# Patient Record
Sex: Male | Born: 1945 | Race: White | Hispanic: No | Marital: Married | Smoking: Never smoker
Health system: Southern US, Community
[De-identification: ages and names within clinical notes are randomized; demographics above are authoritative.]

## PROBLEM LIST (undated history)

## (undated) DIAGNOSIS — M1990 Unspecified osteoarthritis, unspecified site: Secondary | ICD-10-CM

## (undated) DIAGNOSIS — R42 Dizziness and giddiness: Secondary | ICD-10-CM

## (undated) DIAGNOSIS — Z9989 Dependence on other enabling machines and devices: Secondary | ICD-10-CM

## (undated) DIAGNOSIS — G4700 Insomnia, unspecified: Secondary | ICD-10-CM

## (undated) DIAGNOSIS — I4891 Unspecified atrial fibrillation: Secondary | ICD-10-CM

## (undated) DIAGNOSIS — R011 Cardiac murmur, unspecified: Secondary | ICD-10-CM

## (undated) DIAGNOSIS — G20A1 Parkinson's disease without dyskinesia, without mention of fluctuations: Secondary | ICD-10-CM

## (undated) DIAGNOSIS — B009 Herpesviral infection, unspecified: Secondary | ICD-10-CM

## (undated) DIAGNOSIS — Z8709 Personal history of other diseases of the respiratory system: Secondary | ICD-10-CM

## (undated) DIAGNOSIS — G4730 Sleep apnea, unspecified: Secondary | ICD-10-CM

## (undated) DIAGNOSIS — I10 Essential (primary) hypertension: Secondary | ICD-10-CM

## (undated) DIAGNOSIS — G4733 Obstructive sleep apnea (adult) (pediatric): Secondary | ICD-10-CM

## (undated) DIAGNOSIS — M2550 Pain in unspecified joint: Secondary | ICD-10-CM

## (undated) DIAGNOSIS — G20 Parkinson's disease: Secondary | ICD-10-CM

## (undated) DIAGNOSIS — Z87898 Personal history of other specified conditions: Secondary | ICD-10-CM

## (undated) DIAGNOSIS — K219 Gastro-esophageal reflux disease without esophagitis: Secondary | ICD-10-CM

## (undated) HISTORY — PX: KNEE ARTHROSCOPY: SUR90

## (undated) HISTORY — DX: Herpesviral infection, unspecified: B00.9

## (undated) HISTORY — DX: Dependence on other enabling machines and devices: Z99.89

## (undated) HISTORY — DX: Dizziness and giddiness: R42

## (undated) HISTORY — PX: EYE SURGERY: SHX253

## (undated) HISTORY — PX: TONSILLECTOMY: SUR1361

## (undated) HISTORY — PX: ACHILLES TENDON REPAIR: SUR1153

## (undated) HISTORY — DX: Gastro-esophageal reflux disease without esophagitis: K21.9

## (undated) HISTORY — PX: BACK SURGERY: SHX140

## (undated) HISTORY — PX: OTHER SURGICAL HISTORY: SHX169

## (undated) HISTORY — PX: LUMBAR FUSION: SHX111

## (undated) HISTORY — DX: Obstructive sleep apnea (adult) (pediatric): G47.33

---

## 2000-04-23 ENCOUNTER — Inpatient Hospital Stay (HOSPITAL_COMMUNITY): Admission: EM | Admit: 2000-04-23 | Discharge: 2000-04-25 | Payer: Self-pay | Admitting: Orthopedic Surgery

## 2001-04-17 HISTORY — PX: SPINE SURGERY: SHX786

## 2001-05-22 ENCOUNTER — Ambulatory Visit (HOSPITAL_COMMUNITY): Admission: RE | Admit: 2001-05-22 | Discharge: 2001-05-22 | Payer: Self-pay | Admitting: Specialist

## 2001-05-22 ENCOUNTER — Encounter: Payer: Self-pay | Admitting: Specialist

## 2001-05-30 ENCOUNTER — Encounter: Payer: Self-pay | Admitting: Specialist

## 2001-05-30 ENCOUNTER — Encounter: Admission: RE | Admit: 2001-05-30 | Discharge: 2001-05-30 | Payer: Self-pay | Admitting: Specialist

## 2001-09-06 ENCOUNTER — Encounter: Admission: RE | Admit: 2001-09-06 | Discharge: 2001-09-06 | Payer: Self-pay | Admitting: Neurosurgery

## 2001-09-06 ENCOUNTER — Encounter: Payer: Self-pay | Admitting: Neurosurgery

## 2002-01-09 ENCOUNTER — Encounter: Payer: Self-pay | Admitting: Internal Medicine

## 2002-01-09 ENCOUNTER — Encounter: Admission: RE | Admit: 2002-01-09 | Discharge: 2002-01-09 | Payer: Self-pay | Admitting: Internal Medicine

## 2002-02-07 ENCOUNTER — Ambulatory Visit (HOSPITAL_BASED_OUTPATIENT_CLINIC_OR_DEPARTMENT_OTHER): Admission: RE | Admit: 2002-02-07 | Discharge: 2002-02-07 | Payer: Self-pay | Admitting: Otolaryngology

## 2003-07-13 ENCOUNTER — Encounter: Admission: RE | Admit: 2003-07-13 | Discharge: 2003-07-13 | Payer: Self-pay | Admitting: Internal Medicine

## 2004-04-13 ENCOUNTER — Ambulatory Visit (HOSPITAL_BASED_OUTPATIENT_CLINIC_OR_DEPARTMENT_OTHER): Admission: RE | Admit: 2004-04-13 | Discharge: 2004-04-13 | Payer: Self-pay | Admitting: Urology

## 2004-12-16 ENCOUNTER — Encounter: Admission: RE | Admit: 2004-12-16 | Discharge: 2004-12-16 | Payer: Self-pay | Admitting: Neurology

## 2005-12-29 ENCOUNTER — Ambulatory Visit: Payer: Self-pay | Admitting: Internal Medicine

## 2006-03-15 ENCOUNTER — Ambulatory Visit: Payer: Self-pay | Admitting: Internal Medicine

## 2006-11-21 ENCOUNTER — Ambulatory Visit: Payer: Self-pay | Admitting: Internal Medicine

## 2006-11-21 DIAGNOSIS — F411 Generalized anxiety disorder: Secondary | ICD-10-CM | POA: Insufficient documentation

## 2007-10-22 ENCOUNTER — Emergency Department: Payer: Self-pay | Admitting: Emergency Medicine

## 2007-10-24 ENCOUNTER — Emergency Department: Payer: Self-pay | Admitting: Emergency Medicine

## 2007-11-01 ENCOUNTER — Emergency Department: Payer: Self-pay | Admitting: Emergency Medicine

## 2009-04-28 ENCOUNTER — Encounter: Payer: Self-pay | Admitting: Internal Medicine

## 2010-05-17 NOTE — Consult Note (Signed)
Summary: [facility]  [facility]   Imported By: Lanelle Bal 05/12/2009 14:59:48  _____________________________________________________________________  External Attachment:    Type:   Image     Comment:   External Document

## 2010-05-17 NOTE — Assessment & Plan Note (Signed)
Summary: ANXIETY PROBLEMS//[place]   Vital Signs:  Patient Profile:   65 Years Old Male Weight:      216.50 pounds BP sitting:   120 / 80  Pt. in pain?   no  Vitals Entered By: Kandice Hams (November 21, 2006 4:00 PM) Oxygen therapy [phone]                Chief Complaint:  discuss anxiety and stress and its symptoms.  History of Present Illness: Stress & anxiety with tests & with problems @ job sites.Financial strain as some jobs don't pay as well as expected.Wife notes temper. Mother paranoid schizophrenic.     Family History:    M paranoid schizophrenia    Review of Systems  Psych      Complains of anxiety, easily angered, and irritability.      Denies depression, easily tearful, mental problems, panic attacks, sense of great danger, suicidal thoughts/plans, thoughts of violence, and thoughts /plans of harming others.      PMH SAD with public speaking   Physical Exam  General:     Well-developed,well-nourished,in no acute distress; alert,appropriate and cooperative throughout examination Psych:     Oriented X3, memory intact for recent and remote, normally interactive, good eye contact, not anxious appearing, not depressed appearing, not agitated,  subdued.  MDQ  revealed 4+ answers    Impression & Recommendations:  Problem # 1:  ANXIETY STATE NOS (ICD-300.00)  His updated medication list for this problem includes:    Prozac 10 Mg Caps (Fluoxetine hcl) .Marland Kitchen... 1 once daily   Complete Medication List: 1)  Prozac 10 Mg Caps (Fluoxetine hcl) .Marland Kitchen.. 1 once daily   Patient Instructions: 1)  Please schedule a follow-up appointment in 1 month.Take fluoxetine Sun- Thurs    Prescriptions: PROZAC 10 MG  CAPS (FLUOXETINE HCL) 1 once daily  #90 x 0   Entered and Authorized by:   Marga Melnick MD   Signed by:   Marga Melnick MD on 11/21/2006   Method used:   Print then Give to Patient   RxID:   [id number]

## 2010-09-02 NOTE — Op Note (Signed)
. [facility]  Patient:    [name], [name]                    MRN: [id number] Proc. Date: 04/23/00 Adm. Date:  [id number] Attending:  Skip Mayer                           Operative Report  PREOPERATIVE DIAGNOSIS: 1. Plantar fasciitis with a large plantar heel spur, left. 2. Large painful posterior os calcis heel spur with acute Achilles tendon    tendinitis, left.  POSTOPERATIVE DIAGNOSIS: 1. Plantar fasciitis with a large plantar heel spur, left. 2. Large painful posterior os calcis heel spur with acute Achilles tendon    tendinitis, left.  OPERATION PERFORMED: 1. Plantar fasciotomy, left heel. 2. Excision of heel spur, left heel. 3. Removal of large posterior heel spur by elevating the Achilles tendon.  SURGEON:  Georges Lynch. Darrelyn Hillock, M.D.  ASSISTANT:  Nurse.  ANESTHESIA:  General.  DESCRIPTION OF PROCEDURE:  Under general anesthesia, routine orthopedic prep and drape of the left lower extremity was carried out.  The leg was exsanguinated with an Esmarch.  The tourniquet was elevated at 350 mmHg.  An incision was made over the medial plantar aspect of the left foot.  Bleeders were identified and cauterized.  Great care was taken not to injure underlying neurovascular structures.  Identified the plantar fascia, did a plantar fasciotomy.  I then went down and identified a large heel spur.  I dissected around the heel and utilized the osteotome to remove the spur.  I thoroughly irrigated out the area.  Some bone wax was used to wax the bleeding bone. After I thoroughly irrigated out the area, I inserted some thrombin soaked Gelfoam.  The wound then was closed in layers usual fashion.  Skin was closed with metal staples.  Next, an incision was made along the posterolateral aspect of the left heel. At that time great care was taken not to injure ____________  sensory nerves.  I went ahead and elevated the Achilles tendon and  utilized the oscillating saw, removed a large spur.  There was one major large spur.  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  Drill holes were made in the os calcis and I then sutured the tendon back to the os calcis. I then reapproximated the rim of the tendon as well.  The wound was irrigated and closed in layers in the usual fashion.  I injected about 8 cc total of 0.5% Marcaine without epinephrine into both wound sites.  Sterile Neosporin dressings were applied.  The foot was placed in plantar flexion and I put him in a short leg cast.  Patient was given 1 gm IV Ancef preoperatively.  He was started on Coumadin and heparin protocol postoperatively. DD:  04/23/00 TD:  04/23/00 Job: [id number]

## 2010-09-02 NOTE — Discharge Summary (Signed)
[facility]  Patient:    [name], [name]                    MRN: [id number] Adm. Date:  [id number] Disc. Date: 04/25/00 Attending:  Skip Mayer Dictator:   Della Goo, P.A.                           Discharge Summary  ADMISSION DIAGNOSIS:  Heel spur of the plantar aspect and posterior aspect of the left heel.  DISCHARGE DIAGNOSIS:  Heel spur of the plantar aspect and posterior aspect of the left heel.  PROCEDURES:  Plantar fasciotomy and excision of plantar heel spur, as well as excision of posterior heel spur by elevating the Achilles tendon, and application of short-leg cast, by Dr. Darrelyn Hillock.  CONSULTATIONS:  None.  HISTORY OF PRESENT ILLNESS:  The patient has had diagnosis of left heel spurs for many years, however, recently has had considerable discomfort and was noted to have calcification on x-ray of the heel spurs both in the posterior aspect as well as the plantar aspect of the heel.  It was felt he would require excision of these and was admitted for the procedure.  HOSPITAL COURSE:  The patient tolerated the procedure well.  On the first postoperative day, he was nauseated and unable to take oral analgesics or eat anything.  He required IV analgesics.  His morphine was discontinued and, as the nausea subsided, he was able to be weaned on to p.o. analgesics.  He was placed on Coumadin for DVT and PE prophylaxis.  While in the hospital adjustments in dosage were made according to daily protimes by the pharmacy. On the second postoperative day he had been noted to have a spike in temperature to 102 through the evening.  However, this had resolved with use of incentive spirometry and increased activity.  His nausea was resolved, and he was eating well.  He had no pulmonary or urinary symptoms, and his calves were soft.  He was ambulating in the hallway with no assistance, utilizing crutches, and maintaining a  nonweightbearing status on the operative extremity.  His cast was in good condition, and he was felt stable for discharge to his home.  PERTINENT LABORATORY DATA:  Admission CBC within normal limits.  Repeat postoperatively once again within normal limits.  Coagulation screen on admission was normal.  Prior to discharge his last INR was 1.3.  No x-rays were taken.  EKG on admission showed normal sinus rhythm, incomplete right bundle branch block.  PLAN:  The patient is being discharged to his home.  Arrangements will be made for him to have outpatient protimes and INRs drawn weekly for three weeks, with results telephoned to Dr. Darrelyn Hillock for adjustments in his Coumadin dose.  MEDICATIONS:  He was given prescriptions for: 1. Coumadin 5 mg once daily. 2. Robaxin 500 mg 1 every eight hours as needed for spasm. 3. Percocet 5 mg 1-2 every four to six hours as needed for pain.  DISCHARGE INSTRUCTIONS:  He will monitor his fever closely at home, and if he has further elevations in fever, he will call Dr. Joellyn Rued office.  He will continue to use his incentive spirometry and has been encouraged to continue activity as tolerated.  He will use crutches for ambulation, nonweightbearing on the operative extremity.  He was advised to keep his cast dry and clean at all times and continue elevation of the left  lower extremity.  If he has further questions or concerns, he has been advised to call the office.  FOLLOW-UP:  He will follow up with Dr. Darrelyn Hillock two weeks from the date of his surgery. DD:  04/25/00 TD:  04/25/00 Job: [id number]

## 2010-09-02 NOTE — Op Note (Signed)
NAMECLIF, [name]             ACCOUNT NO.:  [id number]   MEDICAL RECORD NO.:  [id number]          PATIENT TYPE:  AMB   LOCATION:  [facility]                         FACILITY:  [facility]   PHYSICIAN:  Ronald L. Ovidio Hanger, M.D.DATE OF BIRTH:  1945/09/14   DATE OF PROCEDURE:  04/13/2004  DATE OF DISCHARGE:                                 OPERATIVE REPORT   PREOPERATIVE DIAGNOSIS:  Symptomatic left varicocele.   POSTOPERATIVE DIAGNOSIS:  Symptomatic left varicocele.   OPERATION PERFORMED:  Left subinguinal varicocelectomy.   SURGEON:  Lucrezia Starch. Earlene Plater, M.D.   ANESTHESIA:  LMA.   ESTIMATED BLOOD LOSS:  10 mL.   TUBES:  None.   COMPLICATIONS:  None.   INDICATIONS FOR PROCEDURE:  [name] is a very nice 65 year old white  male who presented with left groin pain which had been intermittent and  progressively increased.  On Color Flow Doppler scrotal ultrasound he was  found to have a small right spermatocele but a significant left varicocele  with retrograde blood flow and Valsalva maneuver probably resulting in his  discomfort.  After understanding risks, benefits and alternatives, it was  elected to proceed with left varicocelectomy.   DESCRIPTION OF PROCEDURE:  The patient was placed in supine position.  After  proper LMA anesthesia he was prepped and draped with Betadine in sterile  fashion.  An oblique inguinal incision was made approximately 2 cm in  length.  Sharp dissection was carried down to the subcutaneous tissue.  Scarpa's fascia was separated and the inguinal cord was identified.  The  ilioinguinal nerve was identified and protected.  The external spermatic  fascia was dissected down along with the cremasterics and the cord was  delivered with the vas deferens.  There was a significant lipoma of the cord  noted encircling the varicocele varicose veins.  The vas deferens with its  accompanying vessels were separated and dissection was carried down through  the lipoma.   A group of approximately four large varicose veins were noted  to be in dense adherence and were ligated with 4-0 silk ligature.  The  internal spermatic artery was identified and protected.  No further  varicosities were noted in the cord and it was replaced.  There was an  external spermatic vein noted present and it was ligated also with 4-0 silk  ligature.  Thorough irrigation was performed.  Good hemostasis was noted to  be present.  Scarpa's fascia was closed with  interrupted 2-0 chromic catgut buried sutures and the skin was closed with a  running 4-0 Vicryl subcuticular type stitch and dressed with benzoin and  Steri-Strips, Telfa and toppers.  The patient tolerated the procedure well.  There were no complications.  He was taken to recovery room stable.  The  testicle was palpable in the left scrotal sac.     Rona   RLD/MEDQ  D:  04/13/2004  T:  04/13/2004  Job:  [id number]

## 2011-02-16 HISTORY — PX: COLONOSCOPY: SHX174

## 2011-02-16 LAB — HM COLONOSCOPY

## 2011-06-27 ENCOUNTER — Ambulatory Visit (INDEPENDENT_AMBULATORY_CARE_PROVIDER_SITE_OTHER): Payer: Medicare Other | Admitting: Internal Medicine

## 2011-06-27 ENCOUNTER — Encounter: Payer: Self-pay | Admitting: Internal Medicine

## 2011-06-27 VITALS — BP 128/90 | HR 76 | Temp 98.5°F | Wt 228.0 lb

## 2011-06-27 DIAGNOSIS — R1012 Left upper quadrant pain: Secondary | ICD-10-CM

## 2011-06-27 DIAGNOSIS — I4510 Unspecified right bundle-branch block: Secondary | ICD-10-CM

## 2011-06-27 DIAGNOSIS — R0781 Pleurodynia: Secondary | ICD-10-CM

## 2011-06-27 DIAGNOSIS — R079 Chest pain, unspecified: Secondary | ICD-10-CM

## 2011-06-27 DIAGNOSIS — Z1289 Encounter for screening for malignant neoplasm of other sites: Secondary | ICD-10-CM

## 2011-06-27 LAB — CBC WITH DIFFERENTIAL/PLATELET
Basophils Absolute: 0 K/uL (ref 0.0–0.1)
Basophils Relative: 0.4 % (ref 0.0–3.0)
Eosinophils Absolute: 0.1 K/uL (ref 0.0–0.7)
Eosinophils Relative: 1.9 % (ref 0.0–5.0)
HCT: 44.6 % (ref 39.0–52.0)
Hemoglobin: 15 g/dL (ref 13.0–17.0)
Lymphocytes Relative: 28.5 % (ref 12.0–46.0)
Lymphs Abs: 1.9 K/uL (ref 0.7–4.0)
MCHC: 33.7 g/dL (ref 30.0–36.0)
MCV: 90.5 fl (ref 78.0–100.0)
Monocytes Absolute: 0.5 K/uL (ref 0.1–1.0)
Monocytes Relative: 8.3 % (ref 3.0–12.0)
Neutro Abs: 4 K/uL (ref 1.4–7.7)
Neutrophils Relative %: 60.9 % (ref 43.0–77.0)
Platelets: 195 K/uL (ref 150.0–400.0)
RBC: 4.93 Mil/uL (ref 4.22–5.81)
RDW: 12.9 % (ref 11.5–14.6)
WBC: 6.6 K/uL (ref 4.5–10.5)

## 2011-06-27 MED ORDER — RANITIDINE HCL 150 MG PO TABS: 150.0000 mg | ORAL_TABLET | Freq: Two times a day (BID) | ORAL | Status: DC

## 2011-06-27 NOTE — Progress Notes (Signed)
  Subjective:    Patient ID: [name], male    DOB: January 01, 1946, 66 y.o.   MRN: [id number]  HPI ABDOMINAL  PAIN: Location: Upper left quadrant, right under ribs. He is not having rib pain but pain under the ribs in left upper quadrant Onset: 2 weeks ago  Radiation: No  Severity: up to 4/10 Quality/Character: , bloating  Duration: up to 1 hour  Better with: Alka-seltzer Worse with: Eating; ONLY with meals  Symptoms Nausea/Vomiting: Some nausea, no vomiting  Diarrhea: No Constipation: No Melena/BRBPR: No Hematemesis: No Anorexia: No Fever/Chills: No  Dysuria/hematuria/pyuria: No Rash: No   Wt loss: No NSAIDs/ASA: Occasionally; he does not ingest caffeine. The triggers for reflux were reviewed and none are suggested to be present   Past Surgeries: no Endo; colonoscopy November/12 was negative [facility]).  No personal or family history of ulcers, gastritis,, colitis, colon polyps.       Review of Systems He describes rare palpitations. He denies chest pain, dyspnea, paroxysmal nocturnal dyspnea, or edema. He also has no claudication,pleurisy, cough, sputum production, or hemoptysis.  He has had bilateral hip pain which actually gets better with ambulation.     Objective:   Physical Exam Gen.: Healthy and well-nourished in appearance. Alert, appropriate and cooperative throughout exam. Eyes: No corneal or conjunctival inflammation noted. No icterus Mouth: Oral mucosa and oropharynx reveal no lesions or exudates. Teeth in good repair. Neck: No deformities, masses, or tenderness noted.  Thyroid normal. Lungs: Normal respiratory effort; chest expands symmetrically. Lungs are clear to auscultation without rales, wheezes, or increased work of breathing. Heart: Normal rate and rhythm. Normal S1 and S2. No gallop, click, or rub. No  murmur. Abdomen: Bowel sounds normal; abdomen soft and nontender. No masses, organomegaly or hernias noted.                                                Musculoskeletal/extremities: Minor asymmetry noted of  the thoracic spine. No clubbing, cyanosis, edema, or deformity noted. ROM normal.Tone & strength  normal.Joints normal. Nail health  good. Vascular: Carotid, radial artery, dorsalis pedis and  posterior tibial pulses are full and equal. No bruits present. Neurologic: Alert and oriented x3. Deep tendon reflexes symmetrical and normal.         Skin: Intact without suspicious lesions or rashes. Lymph: No cervical, axillary lymphadenopathy present. Psych: Mood and affect are normal. Normally interactive                                                                                         Assessment & Plan:  #1 left upper quadrant abdominal pain; most likely is hiatal hernia and reflux. EKG reveals no ischemic changes. He does have a right bundle branch block.  Plan: See orders and recommendations

## 2011-06-27 NOTE — Patient Instructions (Addendum)
Please complete stool cards ; STOP Alka Seltzer. The triggers for dyspepsia or "heart burn"  include stress; the "aspirin family" ; alcohol; peppermint; and caffeine (coffee, tea, cola, and chocolate). The aspirin family would include aspirin and the nonsteroidal agents such as ibuprofen &  Naproxen. Tylenol would not cause reflux. If having dyspepsia ; food & drink should be avoided for @ least 2 hours before going to bed.

## 2011-07-04 LAB — POC HEMOCCULT BLD/STL (OFFICE/1-CARD/DIAGNOSTIC): Fecal Occult Blood, POC: NEGATIVE

## 2011-07-04 NOTE — Progress Notes (Signed)
Addended by: Silvio Pate D on: 07/04/2011 01:38 PM   Modules accepted: Orders

## 2011-07-24 ENCOUNTER — Telehealth: Payer: Self-pay | Admitting: Internal Medicine

## 2011-07-24 NOTE — Telephone Encounter (Signed)
RX was sent in last month for #60 with 2 refills. I called the pharmacy and spoke with Heidi, rx was received and on file, this was sent in error

## 2011-07-24 NOTE — Telephone Encounter (Signed)
ranitidine 150mg  tablet.

## 2011-09-25 ENCOUNTER — Other Ambulatory Visit: Payer: Self-pay | Admitting: Internal Medicine

## 2011-12-27 ENCOUNTER — Other Ambulatory Visit: Payer: Self-pay | Admitting: Internal Medicine

## 2012-06-01 ENCOUNTER — Other Ambulatory Visit: Payer: Self-pay

## 2012-06-17 ENCOUNTER — Other Ambulatory Visit: Payer: Self-pay | Admitting: Internal Medicine

## 2012-06-17 NOTE — Telephone Encounter (Signed)
Schedule CPX 

## 2012-08-18 ENCOUNTER — Other Ambulatory Visit: Payer: Self-pay | Admitting: Internal Medicine

## 2012-08-19 NOTE — Telephone Encounter (Signed)
Pt called needs an OV has not been seen in over a year.

## 2012-09-16 ENCOUNTER — Other Ambulatory Visit: Payer: Self-pay | Admitting: Internal Medicine

## 2012-09-16 NOTE — Telephone Encounter (Signed)
Pt with pending appt

## 2012-09-16 NOTE — Telephone Encounter (Signed)
Pt with pending appt 10-11-12

## 2012-10-11 ENCOUNTER — Encounter: Payer: Self-pay | Admitting: Internal Medicine

## 2012-10-11 ENCOUNTER — Ambulatory Visit (INDEPENDENT_AMBULATORY_CARE_PROVIDER_SITE_OTHER): Payer: Medicare Other | Admitting: Internal Medicine

## 2012-10-11 VITALS — BP 132/84 | HR 97 | Temp 98.2°F | Ht 74.0 in | Wt 226.0 lb

## 2012-10-11 DIAGNOSIS — Z0000 Encounter for general adult medical examination without abnormal findings: Secondary | ICD-10-CM

## 2012-10-11 DIAGNOSIS — K219 Gastro-esophageal reflux disease without esophagitis: Secondary | ICD-10-CM

## 2012-10-11 DIAGNOSIS — N429 Disorder of prostate, unspecified: Secondary | ICD-10-CM

## 2012-10-11 DIAGNOSIS — E785 Hyperlipidemia, unspecified: Secondary | ICD-10-CM

## 2012-10-11 DIAGNOSIS — E782 Mixed hyperlipidemia: Secondary | ICD-10-CM | POA: Insufficient documentation

## 2012-10-11 DIAGNOSIS — I4510 Unspecified right bundle-branch block: Secondary | ICD-10-CM

## 2012-10-11 LAB — CBC WITH DIFFERENTIAL/PLATELET
Basophils Absolute: 0 10*3/uL (ref 0.0–0.1)
Basophils Relative: 0.3 % (ref 0.0–3.0)
Eosinophils Absolute: 0.1 10*3/uL (ref 0.0–0.7)
Eosinophils Relative: 0.9 % (ref 0.0–5.0)
HCT: 45.5 % (ref 39.0–52.0)
Hemoglobin: 15.3 g/dL (ref 13.0–17.0)
Lymphocytes Relative: 26.9 % (ref 12.0–46.0)
Lymphs Abs: 2.1 10*3/uL (ref 0.7–4.0)
MCHC: 33.6 g/dL (ref 30.0–36.0)
MCV: 91.1 fl (ref 78.0–100.0)
Monocytes Absolute: 0.7 10*3/uL (ref 0.1–1.0)
Monocytes Relative: 8.8 % (ref 3.0–12.0)
Neutro Abs: 4.9 10*3/uL (ref 1.4–7.7)
Neutrophils Relative %: 63.1 % (ref 43.0–77.0)
Platelets: 205 10*3/uL (ref 150.0–400.0)
RBC: 4.99 Mil/uL (ref 4.22–5.81)
RDW: 12.7 % (ref 11.5–14.6)
WBC: 7.8 10*3/uL (ref 4.5–10.5)

## 2012-10-11 LAB — LIPID PANEL
Cholesterol: 147 mg/dL (ref 0–200)
HDL: 27.5 mg/dL — ABNORMAL LOW (ref >39.00)
LDL Cholesterol: 85 mg/dL (ref 0–99)
Total CHOL/HDL Ratio: 5
Triglycerides: 173 mg/dL — ABNORMAL HIGH (ref 0.0–149.0)
VLDL: 34.6 mg/dL (ref 0.0–40.0)

## 2012-10-11 LAB — PSA: PSA: 0.5 ng/mL (ref 0.10–4.00)

## 2012-10-11 LAB — BASIC METABOLIC PANEL
BUN: 14 mg/dL (ref 6–23)
CO2: 24 mEq/L (ref 19–32)
Calcium: 9.2 mg/dL (ref 8.4–10.5)
Chloride: 103 mEq/L (ref 96–112)
Creatinine, Ser: 1.3 mg/dL (ref 0.4–1.5)
GFR: 61.36 mL/min (ref >60.00)
Glucose, Bld: 92 mg/dL (ref 70–99)
Potassium: 4.2 mEq/L (ref 3.5–5.1)
Sodium: 134 mEq/L — ABNORMAL LOW (ref 135–145)

## 2012-10-11 LAB — HEPATIC FUNCTION PANEL
ALT: 34 U/L (ref 0–53)
AST: 25 U/L (ref 0–37)
Albumin: 4.6 g/dL (ref 3.5–5.2)
Alkaline Phosphatase: 57 U/L (ref 39–117)
Bilirubin, Direct: 0.2 mg/dL (ref 0.0–0.3)
Total Bilirubin: 0.8 mg/dL (ref 0.3–1.2)
Total Protein: 7.6 g/dL (ref 6.0–8.3)

## 2012-10-11 LAB — TSH: TSH: 2.37 u[IU]/mL (ref 0.35–5.50)

## 2012-10-11 MED ORDER — RANITIDINE HCL 150 MG PO TABS: ORAL_TABLET | ORAL | Status: DC

## 2012-10-11 NOTE — Progress Notes (Signed)
Subjective:    Patient ID: [name], male    DOB: [birth year], 67 y.o.   MRN: [id number]  HPI Medicare Wellness Visit:  Psychosocial & medical history were reviewed as required by Medicare (abuse,antisocial behavioral risks,firearm risk).  Social history: caffeine: rare  , alcohol: no  ,  tobacco use:  never  Exercise :  Physically active Home & personal  safety / fall risk:no Limitations of activities of daily living:no Seatbelt  and smoke alarm use:yes Power of Attorney/Living Will status : in place Ophthalmology exam status :current Hearing evaluation status:not current Orientation :oriented X 3 Memory & recall :good  Spelling or math testing:good Active depression / anxiety:denied Foreign travel history : [place] 1995 Immunization status for Shingles /Flu/ PNA/ tetanus : Shingles & PNA needed Transfusion history:  no Preventive health surveillance status of colonoscopy as per protocol/ SOC: current Dental care: every 6 mos  Chart reviewed &  Updated. Active issues reviewed & addressed.      Review of Systems  He has dyspepsia if he does not take the ranitidine every day. He denies significant hoarseness, dysphagia, abdominal pain, unexplained weight loss, melena, or rectal bleeding. Colonoscopy is current and was negative.  He has a history of mild hyperlipidemia; he's never been on a statin     Objective:   Physical Exam Gen.: Healthy and well-nourished in appearance. Alert, appropriate and cooperative throughout exam. Head: Normocephalic without obvious abnormalities; goatee Eyes: No corneal or conjunctival inflammation noted. Pupils equal round reactive to light. Extraocular motion intact. Vision grossly normal with lenses Ears: External  ear exam reveals no significant lesions or deformities. Canals clear .TMs normal. Hearing is grossly normal bilaterally. Nose: External nasal exam reveals no deformity or inflammation. Nasal mucosa are pink and moist. No  lesions or exudates noted.  Mouth: Oral mucosa and oropharynx reveal no lesions or exudates. Teeth in good repair. Neck: No deformities, masses, or tenderness noted. Range of motion & Thyroid normal. Lungs: Normal respiratory effort; chest expands symmetrically. Lungs are clear to auscultation without rales, wheezes, or increased work of breathing. Heart: Normal rate and rhythm. Normal S1 and S2. No gallop, click, or rub. S4 w/o murmur. Abdomen: Bowel sounds normal; abdomen soft and nontender. No masses, organomegaly or hernias noted. Genitalia: Genitalia normal except for left varices. Prostate is asymmetrically enlarged on L w/o nodularity or induration                               Musculoskeletal/extremities: No deformity or scoliosis noted of  the thoracic or lumbar spine.  No clubbing, cyanosis, edema, or significant extremity  deformity noted. Range of motion normal .Tone & strength  Normal. Joints normal . Nail health good. Able to lie down & sit up w/o help. Negative SLR bilaterally Vascular: Carotid, radial artery, dorsalis pedis and  posterior tibial pulses are full and equal. No bruits present. Neurologic: Alert and oriented x3. Deep tendon reflexes symmetrical and normal.         Skin: Intact without suspicious lesions or rashes. Lymph: No cervical, axillary, or inguinal lymphadenopathy present. Psych: Mood and affect are normal. Normally interactive  Assessment & Plan:  #1 Medicare Wellness Exam; criteria met ; data entered #2 Problem List/Diagnoses reviewed #3 asymmetric prostate Plan:  Assessments made/ Orders entered

## 2012-10-11 NOTE — Patient Instructions (Addendum)
Reflux of gastric acid may be asymptomatic as this may occur mainly during sleep.The triggers for reflux  include stress; the "aspirin family" ; alcohol; peppermint; and caffeine (coffee, tea, cola, and chocolate). The aspirin family would include aspirin and the nonsteroidal agents such as ibuprofen &  Naproxen. Tylenol would not cause reflux. If having symptoms ; food & drink should be avoided for @ least 2 hours before going to bed.   If you activate the  My Chart system; lab & Xray results will be released directly  to you as soon as I review & address these through the computer. If you choose not to sign up for My Chart within 36 hours of labs being drawn; results will be reviewed & interpretation added before being copied & mailed, causing a delay in getting the results to you.If you do not receive that report within 7-10 days ,please call. Additionally you can use this system to gain direct  access to your records  if  out of town or @ an office of a  physician who is not in  the My Chart network.  This improves continuity of care & places you in control of your medical record.

## 2012-11-20 ENCOUNTER — Other Ambulatory Visit: Payer: Self-pay

## 2013-02-20 ENCOUNTER — Other Ambulatory Visit: Payer: Self-pay

## 2013-10-01 ENCOUNTER — Other Ambulatory Visit: Payer: Self-pay

## 2013-10-01 DIAGNOSIS — K219 Gastro-esophageal reflux disease without esophagitis: Secondary | ICD-10-CM

## 2013-10-01 MED ORDER — RANITIDINE HCL 150 MG PO TABS: ORAL_TABLET | ORAL | Status: DC

## 2014-05-12 ENCOUNTER — Ambulatory Visit: Payer: Medicare Other | Admitting: Family Medicine

## 2014-06-19 ENCOUNTER — Other Ambulatory Visit: Payer: Self-pay | Admitting: Neurosurgery

## 2014-06-19 DIAGNOSIS — M5416 Radiculopathy, lumbar region: Secondary | ICD-10-CM

## 2014-07-08 ENCOUNTER — Ambulatory Visit
Admission: RE | Admit: 2014-07-08 | Discharge: 2014-07-08 | Disposition: A | Discharge status: Home / Self Care | Payer: PPO | Source: Ambulatory Visit | Attending: Neurosurgery | Admitting: Neurosurgery

## 2014-07-08 DIAGNOSIS — M5416 Radiculopathy, lumbar region: Secondary | ICD-10-CM

## 2014-10-13 ENCOUNTER — Encounter: Payer: Self-pay | Admitting: Internal Medicine

## 2014-10-13 ENCOUNTER — Ambulatory Visit (INDEPENDENT_AMBULATORY_CARE_PROVIDER_SITE_OTHER): Payer: PPO | Admitting: Internal Medicine

## 2014-10-13 ENCOUNTER — Other Ambulatory Visit (INDEPENDENT_AMBULATORY_CARE_PROVIDER_SITE_OTHER): Payer: PPO

## 2014-10-13 VITALS — BP 140/84 | HR 80 | Temp 98.6°F | Resp 15 | Ht 75.0 in | Wt 223.5 lb

## 2014-10-13 DIAGNOSIS — E782 Mixed hyperlipidemia: Secondary | ICD-10-CM

## 2014-10-13 DIAGNOSIS — K219 Gastro-esophageal reflux disease without esophagitis: Secondary | ICD-10-CM

## 2014-10-13 DIAGNOSIS — R42 Dizziness and giddiness: Secondary | ICD-10-CM

## 2014-10-13 DIAGNOSIS — N429 Disorder of prostate, unspecified: Secondary | ICD-10-CM

## 2014-10-13 DIAGNOSIS — Z0000 Encounter for general adult medical examination without abnormal findings: Secondary | ICD-10-CM

## 2014-10-13 HISTORY — DX: Dizziness and giddiness: R42

## 2014-10-13 LAB — HEPATIC FUNCTION PANEL
ALT: 30 U/L (ref 0–53)
AST: 22 U/L (ref 0–37)
Albumin: 4.5 g/dL (ref 3.5–5.2)
Alkaline Phosphatase: 63 U/L (ref 39–117)
Bilirubin, Direct: 0.2 mg/dL (ref 0.0–0.3)
Total Bilirubin: 0.8 mg/dL (ref 0.2–1.2)
Total Protein: 7.7 g/dL (ref 6.0–8.3)

## 2014-10-13 LAB — CBC WITH DIFFERENTIAL/PLATELET
Basophils Absolute: 0 10*3/uL (ref 0.0–0.1)
Basophils Relative: 0.3 % (ref 0.0–3.0)
Eosinophils Absolute: 0.2 10*3/uL (ref 0.0–0.7)
Eosinophils Relative: 2.8 % (ref 0.0–5.0)
HCT: 47.5 % (ref 39.0–52.0)
Hemoglobin: 16.1 g/dL (ref 13.0–17.0)
Lymphocytes Relative: 21.8 % (ref 12.0–46.0)
Lymphs Abs: 1.8 10*3/uL (ref 0.7–4.0)
MCHC: 33.9 g/dL (ref 30.0–36.0)
MCV: 90.1 fl (ref 78.0–100.0)
Monocytes Absolute: 0.7 10*3/uL (ref 0.1–1.0)
Monocytes Relative: 8.9 % (ref 3.0–12.0)
Neutro Abs: 5.4 10*3/uL (ref 1.4–7.7)
Neutrophils Relative %: 66.2 % (ref 43.0–77.0)
Platelets: 208 10*3/uL (ref 150.0–400.0)
RBC: 5.28 Mil/uL (ref 4.22–5.81)
RDW: 13.2 % (ref 11.5–15.5)
WBC: 8.2 10*3/uL (ref 4.0–10.5)

## 2014-10-13 LAB — BASIC METABOLIC PANEL
BUN: 16 mg/dL (ref 6–23)
CO2: 28 mEq/L (ref 19–32)
Calcium: 9.7 mg/dL (ref 8.4–10.5)
Chloride: 102 mEq/L (ref 96–112)
Creatinine, Ser: 1.34 mg/dL (ref 0.40–1.50)
GFR: 56.29 mL/min — ABNORMAL LOW (ref >60.00)
Glucose, Bld: 96 mg/dL (ref 70–99)
Potassium: 4.7 mEq/L (ref 3.5–5.1)
Sodium: 139 mEq/L (ref 135–145)

## 2014-10-13 LAB — LIPID PANEL
Cholesterol: 155 mg/dL (ref 0–200)
HDL: 38.3 mg/dL — ABNORMAL LOW (ref >39.00)
LDL Cholesterol: 100 mg/dL — ABNORMAL HIGH (ref 0–99)
NonHDL: 116.7
Total CHOL/HDL Ratio: 4
Triglycerides: 83 mg/dL (ref 0.0–149.0)
VLDL: 16.6 mg/dL (ref 0.0–40.0)

## 2014-10-13 LAB — PSA: PSA: 0.68 ng/mL (ref 0.10–4.00)

## 2014-10-13 LAB — TSH: TSH: 3.03 u[IU]/mL (ref 0.35–4.50)

## 2014-10-13 NOTE — Assessment & Plan Note (Signed)
Lipids, LFTs, TSH  

## 2014-10-13 NOTE — Progress Notes (Signed)
Subjective:   [name] is a 69 y.o. male who presents for Medicare Annual/Subsequent preventive examination.  Review of Systems:  HRA assessment completed during visit;  Patient is here for Annual Wellness Assessment / c/o of vertigo x 2 days; resolving some today Started yesterday around noon; states he lies down; room spinning some less than a minute   Problem list review for risk metabolic syndrome; hyperlipidemia <HDL; and is overweight; with abdominal weight;  Social hx; Lives with spouse  Lipids 10/11/12; chol 147; Trig 173; HDL 27; LDL 85; Ratio 5  BMI: 27.9 Diet; eats 2 meals; Eggs; hash browns; gravy; bacon sometimes; coffee Lunch vegetables with chicken Supper; tomato sandwich or cereal  Eats a late lunch;  Exercise; goes to the gym but stopped due to back pain; Walks about 2 miles qod when his back is not hurting; Just rec'd a shot to his back and did not hurt x 3 weeks; but then started hurting again. 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 Linna Darner;  Shingles/ educated on Part d Colonoscopy; completed in [place];  EKG 10/11/12 Vision: Due Hearing: 2000hz  both ears Dental: regular Uses  "past tense" essential on forehead and helps with h/a   Gave information on safety to take home;       Objective:    Vitals: BP 140/84 mmHg  Pulse 80  Temp(Src) 98.6 F (37 C) (Oral)  Ht 6\' 3"  (1.905 m)  Wt 223 lb 8 oz (101.379 kg)  BMI 27.94 kg/m2  SpO2 96%  Tobacco History  Smoking status  . Never Smoker   Smokeless tobacco  . Not on file     Counseling given: Not Answered   Past Medical History  Diagnosis Date  . GERD (gastroesophageal reflux disease)   . Hyperlipidemia   . Vertigo 10/13/2014    recent episode; c/o of issue with left ear   Past Surgical History  Procedure Laterality Date  . Spine surgery  2003    cervical fusion  . Knee arthroscopy      L  . Achilles tendon repair    . Varicoelectomy    . Colonoscopy  02/2011    Negative,[facility]   Family History  Problem Relation Age of Onset  . Hypertension Mother   . Stroke Mother     >52  . Asthma Mother   . Diabetes Father   . Heart attack Paternal Grandfather     >55  . Cancer Neg Hx    History  Sexual Activity  . Sexual Activity: Not on file    Outpatient Encounter Prescriptions as of 10/13/2014  Medication Sig  . GARLIC PO Take by mouth.  . Multiple  Vitamin (MULTIVITAMIN) capsule Take 1 capsule by mouth.  . ranitidine (ZANTAC) 150 MG tablet 1 bid prn (Patient not taking: Reported on 10/13/2014)   No facility-administered encounter medications on file as of 10/13/2014.    Activities of Daily Living In your present state of health, do you have any difficulty performing the following activities: 10/13/2014  Hearing? N  Vision? N  Difficulty concentrating or making decisions? N  Walking or climbing stairs? N  Dressing or bathing? N  Doing errands, shopping? N    Patient Care Team: Hendricks Limes, MD as PCP - General   Assessment:    Objective:   The goal of the wellness visit is to assist the patient how to close the gaps in care and create a preventative care plan for  the patient.  Personalized Education was given regarding:   Pt determined a personalized goal; see patient goals;  Assessment included: Calcium and Vit D as appropriate/ Osteoporosis risk reviewed Taking meds without issues; no barriers identified Labs were and fup visit noted with MD if labs are due to be re-drawn. Stress: Recommendations for managing stress if assessed as a factor;  No Risk for hepatitis or high risk social behavior identified via hepatitis screen Educated on shingles and follow up with insurance company for co-pays or charges applied to Part D benefit. Educated on Vaccines; doesn't think he has taken pneumonia; not sure about tetanus Safety issues reviewed; Cognition assessed by AD8; Score 0 MMSE deferred as the patient stated they had no memory issues; No identified risk were noted; The patient was oriented x 3; appropriate in dress and manner and no objective failures at ADL's or IADL's.     Vaccine status/ referred to dr. Linna Darner  Colonoscopy; stated completed  Eye exam due  Hearing 2000hz  both ears Dental; regular    Exercise Activities and Dietary recommendations    Goals    None     Fall Risk Fall Risk  10/13/2014 10/11/2012  Falls in the past year? No No   Depression Screen PHQ 2/9 Scores 10/13/2014 10/11/2012  PHQ - 2 Score 0 0    Cognitive Testing MMSE - Mini Mental State Exam 10/13/2014  Not completed: Unable to complete     There is no immunization history on file for this patient. Screening Tests Health Maintenance  Topic Date Due  . TETANUS/TDAP  06/12/1965  . COLONOSCOPY  06/12/1996  . ZOSTAVAX  06/12/2006  . PNA vac Low Risk Adult (1 of 2 - PCV13) 06/13/2011  . INFLUENZA VACCINE  11/16/2014      Plan:    Plan   The patient agrees to: fup on back and then will start exercising;   Advanced directive:completed   During the course of the visit the patient was educated and counseled about the following appropriate  screening and preventive services:   Vaccines to include Pneumoccal, Influenza, Hepatitis B, Td, Zostavax, HCV  Electrocardiogram  Cardiovascular Disease  Colorectal cancer screening  Diabetes screening  Prostate Cancer Screening  Glaucoma screening  Nutrition counseling   Smoking cessation counseling  Patient Instructions (the written plan) was given to the patient.    Wynetta Fines, RN  10/13/2014

## 2014-10-13 NOTE — Assessment & Plan Note (Signed)
CBC Anti reflux measure

## 2014-10-13 NOTE — Progress Notes (Signed)
   Subjective:    Patient ID: [name], male    DOB: 04-26-1945, 69 y.o.   MRN: [id number]  HPI He is here for a Medicare Wellness exam and update of PMH  And assessment of acute issues.  He's had an exacerbation of his vertigo beginning 10/12/14. The trigger appeared to be cool air blowing on his left ear while driving. Cool air such as from a fan has been the trigger for these events. These typically occur 6 times per year. They're associated with frontal headaches and nausea. Typically he will put cotton in the ear to prevent this. When present it is aggravated by flexing at the waist as well as turning in bed. In the past 24 hours he used essential oils topically over the forehead which resolved the frontal headache as well as the vertiginous symptoms.  He is otherwise asymptomatic except for reflux which is controlled by a Lansoprazole 15 mg twice a day. He had switched a year ago from ranitidine as it did not control the GI symptoms.  He's being actively treated for degenerative disc disease with  "slipped disc" documented on MRI. Dr. Maryjean Ka has administered epidural steroidal injections.  He is on a heart healthy no added salt diet. He had been walking 2 miles per day typically without cardiopulmonary symptoms. Exercise has been curtailed in the last month due to his back issues.  Colonoscopy was completed in 2012 by Dr. Ferdinand Lango in [place]. It was negative; it would be due in 2022.   Review of Systems  Chest pain, palpitations, tachycardia, exertional dyspnea, paroxysmal nocturnal dyspnea, claudication or edema are absent. No unexplained weight loss, abdominal pain, significant dyspepsia, dysphagia, melena, rectal bleeding, or persistently small caliber stools. Dysuria, pyuria, hematuria, frequency, nocturia or polyuria are denied. Change in hair, skin, nails denied. No bowel changes of constipation or diarrhea. No intolerance to heat or cold.     Objective:   Physical  Exam Pertinent or positive findings include: He has a goatee. He has slight decreased range of motion of the cervical spine. Crepitus is noted in the knees. 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 Prostate is enlarged without nodularity. Rhomberg testing slightly unsteady; otherwise neurologic exam is unremarkable.  General appearance :adequately nourished; in no distress.  Eyes: No conjunctival inflammation or scleral icterus is present.  Oral exam:  Lips and gums are healthy appearing.There is no oropharyngeal erythema or exudate noted. Dental hygiene is good.  Heart:  Normal rate and regular rhythm. S1 and S2 normal without gallop, murmur, click, rub or other extra sounds    Lungs:Chest clear to auscultation; no wheezes, rhonchi,rales ,or rubs present.No increased work of breathing.   Abdomen: bowel sounds normal, soft and non-tender without masses, organomegaly or hernias noted.  No guarding or rebound.  Vascular : all pulses equal ; no bruits present.  Skin:Warm & dry.  Intact without suspicious lesions or rashes ; no tenting or jaundice   Lymphatic: No lymphadenopathy is noted about the head, neck, axilla, or inguinal areas.   Neuro: Strength, tone & DTRs normal.         Assessment & Plan:  #1 Medicare Wellness exam; see evaluation which was supervised   #2 recurrent benign  positional vertigo  #3 degenerative disc disease   #4 BPH  #5 see problem list with recommendations

## 2014-10-13 NOTE — Assessment & Plan Note (Signed)
PSA

## 2014-10-13 NOTE — Progress Notes (Signed)
Pre visit review using our clinic review tool, if applicable. No additional management support is needed unless otherwise documented below in the visit note. 

## 2014-10-13 NOTE — Patient Instructions (Addendum)
[name] , Thank you for taking time to come for your Medicare Wellness Visit. I appreciate your ongoing commitment to your health goals. Please review the following plan we discussed and let me know if I can assist you in the future.   These are the goals we discussed: Goals    None      This is a list of the screening recommended for you and due dates:  Health Maintenance  Topic Date Due  . Tetanus Vaccine  06/12/1965  . Colon Cancer Screening  06/12/1996  . Shingles Vaccine  06/12/2006  . Pneumonia vaccines (1 of 2 - PCV13) 06/13/2011  . Flu Shot  11/16/2014     Health Maintenance A healthy lifestyle and preventative care can promote health and wellness.  Maintain regular health, dental, and eye exams.  Eat a healthy diet. Foods like vegetables, fruits, whole grains, low-fat dairy products, and lean protein foods contain the nutrients you need and are low in calories. Decrease your intake of foods high in solid fats, added sugars, and salt. Get information about a proper diet from your health care provider, if necessary.  Regular physical exercise is one of the most important things you can do for your health. Most adults should get at least 150 minutes of moderate-intensity exercise (any activity that increases your heart rate and causes you to sweat) each week. In addition, most adults need muscle-strengthening exercises on 2 or more days a week.   Maintain a healthy weight. The body mass index (BMI) is a screening tool to identify possible weight problems. It provides an estimate of body fat based on height and weight. Your health care provider can find your BMI and can help you achieve or maintain a healthy weight. For males 20 years and older:  A BMI below 18.5 is considered underweight.  A BMI of 18.5 to 24.9 is normal.  A BMI of 25 to 29.9 is considered overweight.  A BMI of 30 and above is considered obese.  Maintain normal blood lipids and cholesterol by  exercising and minimizing your intake of saturated fat. Eat a balanced diet with plenty of fruits and vegetables. Blood tests for lipids and cholesterol should begin at age 37 and be repeated every 5 years. If your lipid or cholesterol levels are high, you are over age 15, or you are at high risk for heart disease, you may need your cholesterol levels checked more frequently.Ongoing high lipid and cholesterol levels should be treated with medicines if diet and exercise are not working.  If you smoke, find out from your health care provider how to quit. If you do not use tobacco, do not start.  Lung cancer screening is recommended for adults aged 81-80 years who are at high risk for developing lung cancer because of a history of smoking. A yearly low-dose CT scan of the lungs is recommended for people who have at least a 30-pack-year history of smoking and are current smokers or have quit within the past 15 years. A pack year of smoking is smoking an average of 1 pack of cigarettes a day for 1 year (for example, a 30-pack-year history of smoking could mean smoking 1 pack a day for 30 years or 2 packs a day for 15 years). Yearly screening should continue until the smoker has stopped smoking for at least 15 years. Yearly screening should be stopped for people who develop a health problem that would prevent them from having lung cancer treatment.  If  you choose to drink alcohol, do not have more than 2 drinks per day. One drink is considered to be 12 oz (360 mL) of beer, 5 oz (150 mL) of wine, or 1.5 oz (45 mL) of liquor.  Avoid the use of street drugs. Do not share needles with anyone. Ask for help if you need support or instructions about stopping the use of drugs.  High blood pressure causes heart disease and increases the risk of stroke. Blood pressure should be checked at least every 1-2 years. Ongoing high blood pressure should be treated with medicines if weight loss and exercise are not  effective.  If you are 32-75 years old, ask your health care provider if you should take aspirin to prevent heart disease.  Diabetes screening involves taking a blood sample to check your fasting blood sugar level. This should be done once every 3 years after age 52 if you are at a normal weight and without risk factors for diabetes. Testing should be considered at a younger age or be carried out more frequently if you are overweight and have at least 1 risk factor for diabetes.  Colorectal cancer can be detected and often prevented. Most routine colorectal cancer screening begins at the age of 13 and continues through age 4. However, your health care provider may recommend screening at an earlier age if you have risk factors for colon cancer. On a yearly basis, your health care provider may provide home test kits to check for hidden blood in the stool. A small camera at the end of a tube may be used to directly examine the colon (sigmoidoscopy or colonoscopy) to detect the earliest forms of colorectal cancer. Talk to your health care provider about this at age 89 when routine screening begins. A direct exam of the colon should be repeated every 5-10 years through age 73, unless early forms of precancerous polyps or small growths are found.  People who are at an increased risk for hepatitis B should be screened for this virus. You are considered at high risk for hepatitis B if:  You were born in a country where hepatitis B occurs often. Talk with your health care provider about which countries are considered high risk.  Your parents were born in a high-risk country and you have not received a shot to protect against hepatitis B (hepatitis B vaccine).  You have HIV or AIDS.  You use needles to inject street drugs.  You live with, or have sex with, someone who has hepatitis B.  You are a man who has sex with other men (MSM).  You get hemodialysis treatment.  You take certain medicines for  conditions like cancer, organ transplantation, and autoimmune conditions.  Hepatitis C blood testing is recommended for all people born from [birth year] through 1964 and any individual with known risk factors for hepatitis C.  Healthy men should no longer receive prostate-specific antigen (PSA) blood tests as part of routine cancer screening. Talk to your health care provider about prostate cancer screening.  Testicular cancer screening is not recommended for adolescents or adult males who have no symptoms. Screening includes self-exam, a health care provider exam, and other screening tests. Consult with your health care provider about any symptoms you have or any concerns you have about testicular cancer.  Practice safe sex. Use condoms and avoid high-risk sexual practices to reduce the spread of sexually transmitted infections (STIs).  You should be screened for STIs, including gonorrhea and chlamydia if:  You are  sexually active and are younger than 24 years.  You are older than 24 years, and your health care provider tells you that you are at risk for this type of infection.  Your sexual activity has changed since you were last screened, and you are at an increased risk for chlamydia or gonorrhea. Ask your health care provider if you are at risk.  If you are at risk of being infected with HIV, it is recommended that you take a prescription medicine daily to prevent HIV infection. This is called pre-exposure prophylaxis (PrEP). You are considered at risk if:  You are a man who has sex with other men (MSM).  You are a heterosexual man who is sexually active with multiple partners.  You take drugs by injection.  You are sexually active with a partner who has HIV.  Talk with your health care provider about whether you are at high risk of being infected with HIV. If you choose to begin PrEP, you should first be tested for HIV. You should then be tested every 3 months for as long as you are taking  PrEP.  Use sunscreen. Apply sunscreen liberally and repeatedly throughout the day. You should seek shade when your shadow is shorter than you. Protect yourself by wearing long sleeves, pants, a wide-brimmed hat, and sunglasses year round whenever you are outdoors.  Tell your health care provider of new moles or changes in moles, especially if there is a change in shape or color. Also, tell your health care provider if a mole is larger than the size of a pencil eraser.  A one-time screening for abdominal aortic aneurysm (AAA) and surgical repair of large AAAs by ultrasound is recommended for men aged 7-75 years who are current or former smokers.  Stay current with your vaccines (immunizations). Document Released: 09/30/2007 Document Revised: 04/08/2013 Document Reviewed: 08/29/2010 [facility] Patient Information 2015 [place], [place]. This information is not intended to replace advice given to you by your health care provider. Make sure you discuss any questions you have with your health care provider.  Reflux of gastric acid may be asymptomatic as this may occur mainly during sleep.The triggers for reflux  include stress; the "aspirin family" ; alcohol; peppermint; and caffeine (coffee, tea, cola, and chocolate). The aspirin family would include aspirin and the nonsteroidal agents such as ibuprofen &  Naproxen. Tylenol would not cause reflux. If having symptoms ; food & drink should be avoided for @ least 2 hours before going to bed. Colonoscopy due 2023    Go to Web M.D. for information on benign positional vertigo (BPV) . Physical therapy exercises can treat that.

## 2014-10-14 ENCOUNTER — Encounter: Payer: Self-pay | Admitting: Internal Medicine

## 2014-11-20 ENCOUNTER — Other Ambulatory Visit: Payer: Self-pay | Admitting: Neurosurgery

## 2014-12-05 NOTE — Pre-Procedure Instructions (Signed)
[name]  12/05/2014     Your procedure is scheduled on August 31.  Report to [facility] Admitting at 6:30 A.M.  Call this number if you have problems the morning of surgery:  [phone]   Remember:  Do not eat food or drink liquids after midnight.  Take these medicines the morning of surgery with A SIP OF WATER: Hydrocodone (if needed), Claritin, Zantac   STOP Multiple Vitamin August 24   STOP/ Do not take Aspirin, Aleve, Naproxen, Advil, Ibuprofen, Motrin, Vitamins, Herbs, or Supplements starting August 24   Do not wear jewelry, make-up or nail polish.  Do not wear lotions, powders, or perfumes.  You may wear deodorant.  Do not shave 48 hours prior to surgery.  Men may shave face and neck.  Do not bring valuables to the hospital.  [facility] is not responsible for any belongings or valuables.  Contacts, dentures or bridgework may not be worn into surgery.  Leave your suitcase in the car.  After surgery it may be brought to your room.  For patients admitted to the hospital, discharge time will be determined by your treatment team.  Patients discharged the day of surgery will not be allowed to drive home.   [facility] - Preparing for Surgery  Before surgery, you can play an important role.  Because skin is not sterile, your skin needs to be as free of germs as possible.  You can reduce the number of germs on you skin by washing with CHG (chlorahexidine gluconate) soap before surgery.  CHG is an antiseptic cleaner which kills germs and bonds with the skin to continue killing germs even after washing.  Please DO NOT use if you have an allergy to CHG or antibacterial soaps.  If your skin becomes reddened/irritated stop using the CHG and inform your nurse when you arrive at Short Stay.  Do not shave (including legs and underarms) for at least 48 hours prior to the first CHG shower.  You may shave your face.  Please follow these instructions  carefully:   1.  Shower with CHG Soap the night before surgery and the morning of Surgery.  2.  If you choose to wash your hair, wash your hair first as usual with your normal shampoo.  3.  After you shampoo, rinse your hair and body thoroughly to remove the shampoo.  4.  Use CHG as you would any other liquid soap.  You can apply CHG directly to the skin and wash gently with scrungie or a clean washcloth.  5.  Apply the CHG Soap to your body ONLY FROM THE NECK DOWN.  Do not use on open wounds or open sores.  Avoid contact with your eyes, ears, mouth and genitals (private parts).  Wash genitals (private parts) with your normal soap.  6.  Wash thoroughly, paying special attention to the area where your surgery will be performed.  7.  Thoroughly rinse your body with warm water from the neck down.  8.  DO NOT shower/wash with your normal soap after using and rinsing off the CHG Soap.  9.  Pat yourself dry with a clean towel.            10.  Wear clean pajamas.            11.  Place clean sheets on your bed the night of your first shower and do not sleep with pets.  Day of Surgery  Do not  apply any lotions the morning of surgery.  Please wear clean clothes to the hospital/surgery center.    Please read over the following fact sheets that you were given. Pain Booklet, Coughing and Deep Breathing, Blood Transfusion Information and Surgical Site Infection Prevention

## 2014-12-07 ENCOUNTER — Encounter (HOSPITAL_COMMUNITY): Payer: Self-pay

## 2014-12-07 ENCOUNTER — Encounter (HOSPITAL_COMMUNITY)
Admission: RE | Admit: 2014-12-07 | Discharge: 2014-12-07 | Disposition: A | Discharge status: Home / Self Care | Payer: PPO | Source: Ambulatory Visit | Attending: Neurosurgery | Admitting: Neurosurgery

## 2014-12-07 DIAGNOSIS — M4316 Spondylolisthesis, lumbar region: Secondary | ICD-10-CM | POA: Diagnosis not present

## 2014-12-07 DIAGNOSIS — Z01812 Encounter for preprocedural laboratory examination: Secondary | ICD-10-CM | POA: Diagnosis not present

## 2014-12-07 DIAGNOSIS — Z0183 Encounter for blood typing: Secondary | ICD-10-CM | POA: Insufficient documentation

## 2014-12-07 HISTORY — DX: Essential (primary) hypertension: I10

## 2014-12-07 HISTORY — DX: Insomnia, unspecified: G47.00

## 2014-12-07 HISTORY — DX: Personal history of other specified conditions: Z87.898

## 2014-12-07 HISTORY — DX: Personal history of other diseases of the respiratory system: Z87.09

## 2014-12-07 HISTORY — DX: Unspecified osteoarthritis, unspecified site: M19.90

## 2014-12-07 HISTORY — DX: Cardiac murmur, unspecified: R01.1

## 2014-12-07 HISTORY — DX: Pain in unspecified joint: M25.50

## 2014-12-07 LAB — BASIC METABOLIC PANEL
Anion gap: 9 (ref 5–15)
BUN: 15 mg/dL (ref 6–20)
CO2: 23 mmol/L (ref 22–32)
Calcium: 9 mg/dL (ref 8.9–10.3)
Chloride: 108 mmol/L (ref 101–111)
Creatinine, Ser: 1.3 mg/dL — ABNORMAL HIGH (ref 0.61–1.24)
GFR calc Af Amer: >60 mL/min (ref >60)
GFR calc non Af Amer: 55 mL/min — ABNORMAL LOW (ref >60)
Glucose, Bld: 107 mg/dL — ABNORMAL HIGH (ref 65–99)
Potassium: 4.2 mmol/L (ref 3.5–5.1)
Sodium: 140 mmol/L (ref 135–145)

## 2014-12-07 LAB — CBC
HCT: 42.3 % (ref 39.0–52.0)
Hemoglobin: 14.6 g/dL (ref 13.0–17.0)
MCH: 30.6 pg (ref 26.0–34.0)
MCHC: 34.5 g/dL (ref 30.0–36.0)
MCV: 88.7 fL (ref 78.0–100.0)
Platelets: 170 10*3/uL (ref 150–400)
RBC: 4.77 MIL/uL (ref 4.22–5.81)
RDW: 12.8 % (ref 11.5–15.5)
WBC: 5.8 10*3/uL (ref 4.0–10.5)

## 2014-12-07 LAB — TYPE AND SCREEN
ABO/RH(D): B POS
Antibody Screen: NEGATIVE

## 2014-12-07 LAB — SURGICAL PCR SCREEN
MRSA, PCR: NEGATIVE
Staphylococcus aureus: NEGATIVE

## 2014-12-07 LAB — ABO/RH: ABO/RH(D): B POS

## 2014-12-07 MED ORDER — CEFAZOLIN SODIUM-DEXTROSE 2-3 GM-% IV SOLR: 2.0000 g | INTRAVENOUS | Status: DC

## 2014-12-07 NOTE — Progress Notes (Signed)
   12/07/14 0938  OBSTRUCTIVE SLEEP APNEA  Have you ever been diagnosed with sleep apnea through a sleep study? No  Do you snore loudly (loud enough to be heard through closed doors)?  1  Do you often feel tired, fatigued, or sleepy during the daytime? 1  Has anyone observed you stop breathing during your sleep? 0  Do you have, or are you being treated for high blood pressure? 0  BMI more than 35 kg/m2? 0  Age over 68 years old? 1  Neck circumference greater than 40 cm/16 inches? 1  Gender: 1  Obstructive Sleep Apnea Score 5

## 2014-12-07 NOTE — Progress Notes (Signed)
PCP- Dr. Linna Darner Cardiologist - denies  EKG/CXR - pt. Denies Echo/Stress Test/Cardiac Cath - denies  Pt. Denies chest pain and shortness of breath at PAT appointment.

## 2014-12-09 ENCOUNTER — Encounter: Payer: Self-pay | Admitting: Internal Medicine

## 2014-12-09 DIAGNOSIS — Z9189 Other specified personal risk factors, not elsewhere classified: Secondary | ICD-10-CM | POA: Insufficient documentation

## 2014-12-15 ENCOUNTER — Encounter (HOSPITAL_COMMUNITY): Payer: Self-pay | Admitting: Certified Registered Nurse Anesthetist

## 2014-12-16 ENCOUNTER — Inpatient Hospital Stay (HOSPITAL_COMMUNITY): Payer: PPO

## 2014-12-16 ENCOUNTER — Inpatient Hospital Stay (HOSPITAL_COMMUNITY): Payer: PPO | Admitting: Certified Registered Nurse Anesthetist

## 2014-12-16 ENCOUNTER — Encounter (HOSPITAL_COMMUNITY)
Admission: RE | Disposition: A | Discharge status: Home / Self Care | Payer: PPO | Source: Ambulatory Visit | Attending: Neurosurgery

## 2014-12-16 ENCOUNTER — Inpatient Hospital Stay (HOSPITAL_COMMUNITY)
Admission: RE | Admit: 2014-12-16 | Discharge: 2014-12-18 | DRG: 460 | Disposition: A | Discharge status: Home / Self Care | Payer: PPO | Source: Ambulatory Visit | Attending: Neurosurgery | Admitting: Neurosurgery

## 2014-12-16 DIAGNOSIS — E785 Hyperlipidemia, unspecified: Secondary | ICD-10-CM | POA: Diagnosis present

## 2014-12-16 DIAGNOSIS — Z419 Encounter for procedure for purposes other than remedying health state, unspecified: Secondary | ICD-10-CM

## 2014-12-16 DIAGNOSIS — M1990 Unspecified osteoarthritis, unspecified site: Secondary | ICD-10-CM | POA: Diagnosis present

## 2014-12-16 DIAGNOSIS — M5116 Intervertebral disc disorders with radiculopathy, lumbar region: Secondary | ICD-10-CM | POA: Diagnosis present

## 2014-12-16 DIAGNOSIS — M4316 Spondylolisthesis, lumbar region: Secondary | ICD-10-CM | POA: Diagnosis present

## 2014-12-16 DIAGNOSIS — K219 Gastro-esophageal reflux disease without esophagitis: Secondary | ICD-10-CM | POA: Diagnosis present

## 2014-12-16 DIAGNOSIS — R400 Somnolence: Secondary | ICD-10-CM | POA: Diagnosis not present

## 2014-12-16 DIAGNOSIS — R42 Dizziness and giddiness: Secondary | ICD-10-CM | POA: Diagnosis present

## 2014-12-16 DIAGNOSIS — G4700 Insomnia, unspecified: Secondary | ICD-10-CM | POA: Diagnosis present

## 2014-12-16 DIAGNOSIS — M4806 Spinal stenosis, lumbar region: Principal | ICD-10-CM | POA: Diagnosis present

## 2014-12-16 DIAGNOSIS — Z79899 Other long term (current) drug therapy: Secondary | ICD-10-CM

## 2014-12-16 SURGERY — POSTERIOR LUMBAR FUSION 1 LEVEL
Anesthesia: General | Site: Spine Lumbar

## 2014-12-16 MED ORDER — THROMBIN 20000 UNITS EX SOLR
CUTANEOUS | Status: DC | PRN
  Administered 2014-12-16: 20 mL via TOPICAL

## 2014-12-16 MED ORDER — DOCUSATE SODIUM 100 MG PO CAPS
100.0000 mg | ORAL_CAPSULE | Freq: Two times a day (BID) | ORAL | Status: DC
  Administered 2014-12-16 – 2014-12-17 (×3): 100 mg via ORAL
  Filled 2014-12-16 (×3): qty 1

## 2014-12-16 MED ORDER — MIDAZOLAM HCL 5 MG/5ML IJ SOLN
INTRAMUSCULAR | Status: DC | PRN
  Administered 2014-12-16: 2 mg via INTRAVENOUS

## 2014-12-16 MED ORDER — GLYCOPYRROLATE 0.2 MG/ML IJ SOLN
INTRAMUSCULAR | Status: AC
  Filled 2014-12-16: qty 3

## 2014-12-16 MED ORDER — ONDANSETRON HCL 4 MG/2ML IJ SOLN: 4.0000 mg | INTRAMUSCULAR | Status: DC | PRN

## 2014-12-16 MED ORDER — VECURONIUM BROMIDE 10 MG IV SOLR
INTRAVENOUS | Status: DC | PRN
  Administered 2014-12-16 (×2): 2 mg via INTRAVENOUS
  Administered 2014-12-16: 1 mg via INTRAVENOUS
  Administered 2014-12-16 (×2): 2 mg via INTRAVENOUS
  Administered 2014-12-16: 1 mg via INTRAVENOUS

## 2014-12-16 MED ORDER — ARTIFICIAL TEARS OP OINT
TOPICAL_OINTMENT | OPHTHALMIC | Status: AC
  Filled 2014-12-16: qty 3.5

## 2014-12-16 MED ORDER — HYDROMORPHONE HCL 1 MG/ML IJ SOLN
0.2500 mg | INTRAMUSCULAR | Status: DC | PRN
  Administered 2014-12-16 (×3): 0.5 mg via INTRAVENOUS

## 2014-12-16 MED ORDER — PROPOFOL 10 MG/ML IV BOLUS
INTRAVENOUS | Status: AC
  Filled 2014-12-16: qty 20

## 2014-12-16 MED ORDER — OXYCODONE HCL 5 MG PO TABS: 5.0000 mg | ORAL_TABLET | Freq: Once | ORAL | Status: DC | PRN

## 2014-12-16 MED ORDER — PHENYLEPHRINE HCL 10 MG/ML IJ SOLN
INTRAMUSCULAR | Status: DC | PRN
  Administered 2014-12-16 (×3): 80 ug via INTRAVENOUS

## 2014-12-16 MED ORDER — OXYCODONE-ACETAMINOPHEN 5-325 MG PO TABS
1.0000 | ORAL_TABLET | ORAL | Status: DC | PRN
  Administered 2014-12-16 – 2014-12-18 (×9): 2 via ORAL
  Filled 2014-12-16 (×9): qty 2

## 2014-12-16 MED ORDER — CEFAZOLIN SODIUM-DEXTROSE 2-3 GM-% IV SOLR
2.0000 g | Freq: Once | INTRAVENOUS | Status: AC
  Administered 2014-12-16 (×2): 2 g via INTRAVENOUS

## 2014-12-16 MED ORDER — ACETAMINOPHEN 650 MG RE SUPP: 650.0000 mg | RECTAL | Status: DC | PRN

## 2014-12-16 MED ORDER — ACETAMINOPHEN 325 MG PO TABS: 325.0000 mg | ORAL_TABLET | ORAL | Status: DC | PRN

## 2014-12-16 MED ORDER — BUPIVACAINE LIPOSOME 1.3 % IJ SUSP
INTRAMUSCULAR | Status: DC | PRN
  Administered 2014-12-16: 20 mL

## 2014-12-16 MED ORDER — VANCOMYCIN HCL 1000 MG IV SOLR
INTRAVENOUS | Status: DC | PRN
  Administered 2014-12-16: 1000 mg via TOPICAL

## 2014-12-16 MED ORDER — PHENYLEPHRINE 40 MCG/ML (10ML) SYRINGE FOR IV PUSH (FOR BLOOD PRESSURE SUPPORT)
PREFILLED_SYRINGE | INTRAVENOUS | Status: AC
  Filled 2014-12-16: qty 10

## 2014-12-16 MED ORDER — MENTHOL 3 MG MT LOZG: 1.0000 | LOZENGE | OROMUCOSAL | Status: DC | PRN

## 2014-12-16 MED ORDER — SUGAMMADEX SODIUM 200 MG/2ML IV SOLN
INTRAVENOUS | Status: DC | PRN
  Administered 2014-12-16: 208.6 mg via INTRAVENOUS

## 2014-12-16 MED ORDER — BUPIVACAINE LIPOSOME 1.3 % IJ SUSP
20.0000 mL | Freq: Once | INTRAMUSCULAR | Status: DC
  Filled 2014-12-16: qty 20

## 2014-12-16 MED ORDER — ROCURONIUM BROMIDE 50 MG/5ML IV SOLN
INTRAVENOUS | Status: AC
  Filled 2014-12-16: qty 1

## 2014-12-16 MED ORDER — LACTATED RINGERS IV SOLN
INTRAVENOUS | Status: DC
  Administered 2014-12-16: 09:00:00 via INTRAVENOUS

## 2014-12-16 MED ORDER — PHENOL 1.4 % MT LIQD: 1.0000 | OROMUCOSAL | Status: DC | PRN

## 2014-12-16 MED ORDER — LIDOCAINE HCL (CARDIAC) 20 MG/ML IV SOLN
INTRAVENOUS | Status: AC
  Filled 2014-12-16: qty 10

## 2014-12-16 MED ORDER — ACETAMINOPHEN 325 MG PO TABS: 650.0000 mg | ORAL_TABLET | ORAL | Status: DC | PRN

## 2014-12-16 MED ORDER — HYDROMORPHONE HCL 1 MG/ML IJ SOLN
INTRAMUSCULAR | Status: AC
  Filled 2014-12-16: qty 1

## 2014-12-16 MED ORDER — CEFAZOLIN SODIUM-DEXTROSE 2-3 GM-% IV SOLR
2.0000 g | Freq: Three times a day (TID) | INTRAVENOUS | Status: AC
  Administered 2014-12-16 – 2014-12-17 (×2): 2 g via INTRAVENOUS
  Filled 2014-12-16 (×2): qty 50

## 2014-12-16 MED ORDER — OXYCODONE HCL 5 MG/5ML PO SOLN: 5.0000 mg | Freq: Once | ORAL | Status: DC | PRN

## 2014-12-16 MED ORDER — FENTANYL CITRATE (PF) 250 MCG/5ML IJ SOLN
INTRAMUSCULAR | Status: AC
  Filled 2014-12-16: qty 5

## 2014-12-16 MED ORDER — LACTATED RINGERS IV SOLN
INTRAVENOUS | Status: DC
  Administered 2014-12-16: 17:00:00 via INTRAVENOUS

## 2014-12-16 MED ORDER — BACITRACIN ZINC 500 UNIT/GM EX OINT
TOPICAL_OINTMENT | CUTANEOUS | Status: DC | PRN
  Administered 2014-12-16: 1 via TOPICAL

## 2014-12-16 MED ORDER — LACTATED RINGERS IV SOLN
INTRAVENOUS | Status: DC | PRN
  Administered 2014-12-16 (×3): via INTRAVENOUS

## 2014-12-16 MED ORDER — SUCCINYLCHOLINE CHLORIDE 20 MG/ML IJ SOLN
INTRAMUSCULAR | Status: AC
  Filled 2014-12-16: qty 1

## 2014-12-16 MED ORDER — ALUM & MAG HYDROXIDE-SIMETH 200-200-20 MG/5ML PO SUSP
30.0000 mL | Freq: Four times a day (QID) | ORAL | Status: DC | PRN
  Administered 2014-12-17: 30 mL via ORAL
  Filled 2014-12-16: qty 30

## 2014-12-16 MED ORDER — EPHEDRINE SULFATE 50 MG/ML IJ SOLN
INTRAMUSCULAR | Status: AC
  Filled 2014-12-16: qty 1

## 2014-12-16 MED ORDER — 0.9 % SODIUM CHLORIDE (POUR BTL) OPTIME
TOPICAL | Status: DC | PRN
  Administered 2014-12-16: 1000 mL

## 2014-12-16 MED ORDER — NEOSTIGMINE METHYLSULFATE 10 MG/10ML IV SOLN
INTRAVENOUS | Status: AC
  Filled 2014-12-16: qty 1

## 2014-12-16 MED ORDER — FENTANYL CITRATE (PF) 100 MCG/2ML IJ SOLN
INTRAMUSCULAR | Status: DC | PRN
  Administered 2014-12-16: 150 ug via INTRAVENOUS
  Administered 2014-12-16: 100 ug via INTRAVENOUS

## 2014-12-16 MED ORDER — ACETAMINOPHEN 160 MG/5ML PO SOLN
325.0000 mg | ORAL | Status: DC | PRN
  Filled 2014-12-16: qty 20.3

## 2014-12-16 MED ORDER — SODIUM CHLORIDE 0.9 % IR SOLN
Status: DC | PRN
  Administered 2014-12-16: 500 mL

## 2014-12-16 MED ORDER — PROPOFOL 10 MG/ML IV BOLUS
INTRAVENOUS | Status: DC | PRN
  Administered 2014-12-16: 200 mg via INTRAVENOUS

## 2014-12-16 MED ORDER — BISACODYL 10 MG RE SUPP: 10.0000 mg | Freq: Every day | RECTAL | Status: DC | PRN

## 2014-12-16 MED ORDER — HYDROMORPHONE HCL 1 MG/ML IJ SOLN
1.0000 mg | INTRAMUSCULAR | Status: DC | PRN
  Administered 2014-12-16: 1 mg via INTRAVENOUS
  Filled 2014-12-16: qty 1

## 2014-12-16 MED ORDER — VANCOMYCIN HCL 1000 MG IV SOLR
INTRAVENOUS | Status: AC
  Filled 2014-12-16: qty 1000

## 2014-12-16 MED ORDER — ROCURONIUM BROMIDE 100 MG/10ML IV SOLN
INTRAVENOUS | Status: DC | PRN
  Administered 2014-12-16: 50 mg via INTRAVENOUS

## 2014-12-16 MED ORDER — MIDAZOLAM HCL 2 MG/2ML IJ SOLN
INTRAMUSCULAR | Status: AC
  Filled 2014-12-16: qty 4

## 2014-12-16 MED ORDER — ONDANSETRON HCL 4 MG/2ML IJ SOLN
INTRAMUSCULAR | Status: AC
  Filled 2014-12-16: qty 2

## 2014-12-16 MED ORDER — DIAZEPAM 5 MG PO TABS
5.0000 mg | ORAL_TABLET | Freq: Four times a day (QID) | ORAL | Status: DC | PRN
  Administered 2014-12-16 – 2014-12-18 (×5): 5 mg via ORAL
  Filled 2014-12-16 (×5): qty 1

## 2014-12-16 MED ORDER — ONDANSETRON HCL 4 MG/2ML IJ SOLN
INTRAMUSCULAR | Status: DC | PRN
  Administered 2014-12-16: 4 mg via INTRAVENOUS

## 2014-12-16 MED ORDER — CEFAZOLIN SODIUM-DEXTROSE 2-3 GM-% IV SOLR
INTRAVENOUS | Status: AC
  Filled 2014-12-16: qty 50

## 2014-12-16 MED ORDER — SUGAMMADEX SODIUM 200 MG/2ML IV SOLN
INTRAVENOUS | Status: AC
Start: 2014-12-16 — End: 2014-12-16
  Filled 2014-12-16: qty 2

## 2014-12-16 MED ORDER — HYDROCODONE-ACETAMINOPHEN 5-325 MG PO TABS: 1.0000 | ORAL_TABLET | ORAL | Status: DC | PRN

## 2014-12-16 MED ORDER — BUPIVACAINE-EPINEPHRINE (PF) 0.5% -1:200000 IJ SOLN
INTRAMUSCULAR | Status: DC | PRN
  Administered 2014-12-16: 10 mL

## 2014-12-16 MED ORDER — LORATADINE 10 MG PO TABS
10.0000 mg | ORAL_TABLET | Freq: Every day | ORAL | Status: DC
  Administered 2014-12-17: 10 mg via ORAL
  Filled 2014-12-16: qty 1

## 2014-12-16 SURGICAL SUPPLY — 68 items
BAG DECANTER FOR FLEXI CONT (MISCELLANEOUS) ×3 IMPLANT
BENZOIN TINCTURE PRP APPL 2/3 (GAUZE/BANDAGES/DRESSINGS) ×3 IMPLANT
BLADE CLIPPER SURG (BLADE) ×3 IMPLANT
BRUSH SCRUB EZ PLAIN DRY (MISCELLANEOUS) ×3 IMPLANT
BUR MATCHSTICK NEURO 3.0 LAGG (BURR) ×3 IMPLANT
BUR PRECISION FLUTE 6.0 (BURR) ×3 IMPLANT
CANISTER SUCT 3000ML PPV (MISCELLANEOUS) ×3 IMPLANT
CAP REVERE LOCKING (Cap) ×12 IMPLANT
CLOSURE WOUND 1/2 X4 (GAUZE/BANDAGES/DRESSINGS) ×1
CONT SPEC 4OZ CLIKSEAL STRL BL (MISCELLANEOUS) ×3 IMPLANT
COVER BACK TABLE 60X90IN (DRAPES) ×3 IMPLANT
DRAPE C-ARM 42X72 X-RAY (DRAPES) ×18 IMPLANT
DRAPE LAPAROTOMY 100X72X124 (DRAPES) ×3 IMPLANT
DRAPE POUCH INSTRU U-SHP 10X18 (DRAPES) ×3 IMPLANT
DRAPE PROXIMA HALF (DRAPES) ×9 IMPLANT
DRAPE SURG 17X23 STRL (DRAPES) ×12 IMPLANT
ELECT BLADE 4.0 EZ CLEAN MEGAD (MISCELLANEOUS) ×3
ELECT REM PT RETURN 9FT ADLT (ELECTROSURGICAL) ×3
ELECTRODE BLDE 4.0 EZ CLN MEGD (MISCELLANEOUS) ×1 IMPLANT
ELECTRODE REM PT RTRN 9FT ADLT (ELECTROSURGICAL) ×1 IMPLANT
EVACUATOR 1/8 PVC DRAIN (DRAIN) ×3 IMPLANT
GAUZE SPONGE 4X4 12PLY STRL (GAUZE/BANDAGES/DRESSINGS) ×3 IMPLANT
GAUZE SPONGE 4X4 16PLY XRAY LF (GAUZE/BANDAGES/DRESSINGS) IMPLANT
GLOVE BIO SURGEON STRL SZ7 (GLOVE) ×3 IMPLANT
GLOVE BIO SURGEON STRL SZ8 (GLOVE) ×9 IMPLANT
GLOVE BIO SURGEON STRL SZ8.5 (GLOVE) ×6 IMPLANT
GLOVE BIOGEL PI IND STRL 7.0 (GLOVE) ×2 IMPLANT
GLOVE BIOGEL PI IND STRL 7.5 (GLOVE) ×3 IMPLANT
GLOVE BIOGEL PI INDICATOR 7.0 (GLOVE) ×4
GLOVE BIOGEL PI INDICATOR 7.5 (GLOVE) ×6
GLOVE EXAM NITRILE LRG STRL (GLOVE) IMPLANT
GLOVE EXAM NITRILE MD LF STRL (GLOVE) IMPLANT
GLOVE EXAM NITRILE XL STR (GLOVE) IMPLANT
GLOVE EXAM NITRILE XS STR PU (GLOVE) IMPLANT
GLOVE SURG SS PI 7.0 STRL IVOR (GLOVE) ×9 IMPLANT
GOWN STRL REUS W/ TWL LRG LVL3 (GOWN DISPOSABLE) ×1 IMPLANT
GOWN STRL REUS W/ TWL XL LVL3 (GOWN DISPOSABLE) ×5 IMPLANT
GOWN STRL REUS W/TWL 2XL LVL3 (GOWN DISPOSABLE) IMPLANT
GOWN STRL REUS W/TWL LRG LVL3 (GOWN DISPOSABLE) ×2
GOWN STRL REUS W/TWL XL LVL3 (GOWN DISPOSABLE) ×10
KIT BASIN OR (CUSTOM PROCEDURE TRAY) ×3 IMPLANT
KIT ROOM TURNOVER OR (KITS) ×3 IMPLANT
NEEDLE HYPO 21X1.5 SAFETY (NEEDLE) ×3 IMPLANT
NEEDLE HYPO 22GX1.5 SAFETY (NEEDLE) ×3 IMPLANT
NS IRRIG 1000ML POUR BTL (IV SOLUTION) ×3 IMPLANT
PACK LAMINECTOMY NEURO (CUSTOM PROCEDURE TRAY) ×3 IMPLANT
PAD ARMBOARD 7.5X6 YLW CONV (MISCELLANEOUS) ×15 IMPLANT
PATTIES SURGICAL .5 X1 (DISPOSABLE) IMPLANT
ROD REVERE 6.35 45MM (Rod) ×6 IMPLANT
SCREW REVERE 6.35 75X55MM (Screw) ×12 IMPLANT
SPACER ALTERA 10X31 10-14-8 (Spacer) ×3 IMPLANT
SPONGE LAP 4X18 X RAY DECT (DISPOSABLE) IMPLANT
SPONGE NEURO XRAY DETECT 1X3 (DISPOSABLE) IMPLANT
SPONGE SURGIFOAM ABS GEL 100 (HEMOSTASIS) ×3 IMPLANT
STRIP BIOACTIVE 20CC 25X100X8 (Miscellaneous) ×3 IMPLANT
STRIP BIOACTIVE 5CC 25X50X4MM (Miscellaneous) ×3 IMPLANT
STRIP CLOSURE SKIN 1/2X4 (GAUZE/BANDAGES/DRESSINGS) ×2 IMPLANT
SUT VIC AB 1 CT1 18XBRD ANBCTR (SUTURE) ×2 IMPLANT
SUT VIC AB 1 CT1 8-18 (SUTURE) ×4
SUT VIC AB 2-0 CP2 18 (SUTURE) ×6 IMPLANT
SUT VIC AB 2-0 CT1 18 (SUTURE) ×3 IMPLANT
SYR 20CC LL (SYRINGE) ×3 IMPLANT
TAPE CLOTH SURG 4X10 WHT LF (GAUZE/BANDAGES/DRESSINGS) ×3 IMPLANT
TOWEL OR 17X24 6PK STRL BLUE (TOWEL DISPOSABLE) ×3 IMPLANT
TOWEL OR 17X26 10 PK STRL BLUE (TOWEL DISPOSABLE) ×3 IMPLANT
TRAY FOLEY CATH 16FRSI W/METER (SET/KITS/TRAYS/PACK) ×3 IMPLANT
TRAY FOLEY W/METER SILVER 14FR (SET/KITS/TRAYS/PACK) IMPLANT
WATER STERILE IRR 1000ML POUR (IV SOLUTION) ×3 IMPLANT

## 2014-12-16 NOTE — H&P (Signed)
Subjective: The patient is a 69 year old white male who has complained of chronic back, buttock, and leg pain consistent with neurogenic claudication. He has failed medical management and was worked up with a lumbar MRI and lumbar x-rays which demonstrated an L4-5 spondylolisthesis and spinal stenosis. I discussed the various treatment options with the patient including surgery. He has weighed the risks, benefits, and alternatives surgery and decided to proceed with a L4-5 decompression, instrumentation, and fusion.   Past Medical History  Diagnosis Date  . GERD (gastroesophageal reflux disease)   . Hyperlipidemia   . Vertigo 10/13/2014    recurrent; trigger is cool air on left ear  . Insomnia   . Arthritis   . Joint pain   . Hypertension     pt. states borderline   . Heart murmur     as a child  . History of bronchitis   . H/O urinary frequency     Past Surgical History  Procedure Laterality Date  . Spine surgery  2003    cervical fusion  . Knee arthroscopy      L  . Achilles tendon repair Left   . Varicoelectomy    . Colonoscopy  02/2011    Negative,[facility]  . Eye surgery Bilateral     cataracts  . Tonsillectomy      Allergies  Allergen Reactions  . Morphine And Related Hives    hallucinations    Social History  Substance Use Topics  . Smoking status: Never Smoker   . Smokeless tobacco: Not on file  . Alcohol Use: No    Family History  Problem Relation Age of Onset  . Hypertension Mother   . Stroke Mother     >18  . Asthma Mother   . Diabetes Father   . Heart attack Paternal Grandfather     >55  . Cancer Neg Hx    Prior to Admission medications   Medication Sig Start Date End Date Taking? Authorizing Provider  HYDROcodone-acetaminophen (NORCO/VICODIN) 5-325 MG per tablet Take 1-2 tablets by mouth every 8 (eight) hours as needed for moderate pain.   Yes Historical Provider, MD  ibuprofen (ADVIL,MOTRIN) 200 MG tablet Take 600 mg by mouth every 6  (six) hours as needed.   Yes Historical Provider, MD  loratadine (CLARITIN) 10 MG tablet Take 10 mg by mouth daily.   Yes Historical Provider, MD  Melatonin 5 MG TABS Take 2.5 mg by mouth.   Yes Historical Provider, MD  Multiple Vitamin (MULTIVITAMIN) capsule Take 2 capsules by mouth daily. High protein multivitamin   Yes Historical Provider, MD  ranitidine (ZANTAC) 150 MG tablet Take 150 mg by mouth 2 (two) times daily.   Yes Historical Provider, MD     Review of Systems  Positive ROS: As above  All other systems have been reviewed and were otherwise negative with the exception of those mentioned in the HPI and as above.  Objective: Vital signs in last 24 hours: Temp:  [97.1 F (36.2 C)] 97.1 F (36.2 C) (08/31 0627) Resp:  [18] 18 (08/31 0627) BP: (128)/(91) 128/91 mmHg (08/31 0627) SpO2:  [96 %] 96 % (08/31 0627) Weight:  [104.327 kg (230 lb)] 104.327 kg (230 lb) (08/31 0627)  General Appearance: Alert, cooperative, no distress, Head: Normocephalic, without obvious abnormality, atraumatic Eyes: PERRL, conjunctiva/corneas clear, EOM's intact,    Ears: Normal  Throat: Normal  Neck: Supple, symmetrical, trachea midline, no adenopathy; thyroid: No enlargement/tenderness/nodules; no carotid bruit or JVD Back: Symmetric, no curvature,  ROM normal, no CVA tenderness Lungs: Clear to auscultation bilaterally, respirations unlabored Heart: Regular rate and rhythm, no murmur, rub or gallop Abdomen: Soft, non-tender,, no masses, no organomegaly Extremities: Extremities normal, atraumatic, no cyanosis or edema Pulses: 2+ and symmetric all extremities Skin: Skin color, texture, turgor normal, no rashes or lesions  NEUROLOGIC:   Mental status: alert and oriented, no aphasia, good attention span, Fund of knowledge/ memory ok Motor Exam - grossly normal Sensory Exam - grossly normal Reflexes:  Coordination - grossly normal Gait - grossly normal Balance - grossly normal Cranial  Nerves: I: smell Not tested  II: visual acuity  OS: Normal  OD: Normal   II: visual fields Full to confrontation  II: pupils Equal, round, reactive to light  III,VII: ptosis None  III,IV,VI: extraocular muscles  Full ROM  V: mastication Normal  V: facial light touch sensation  Normal  V,VII: corneal reflex  Present  VII: facial muscle function - upper  Normal  VII: facial muscle function - lower Normal  VIII: hearing Not tested  IX: soft palate elevation  Normal  IX,X: gag reflex Present  XI: trapezius strength  5/5  XI: sternocleidomastoid strength 5/5  XI: neck flexion strength  5/5  XII: tongue strength  Normal    Data Review Lab Results  Component Value Date   WBC 5.8 12/07/2014   HGB 14.6 12/07/2014   HCT 42.3 12/07/2014   MCV 88.7 12/07/2014   PLT 170 12/07/2014   Lab Results  Component Value Date   NA 140 12/07/2014   K 4.2 12/07/2014   CL 108 12/07/2014   CO2 23 12/07/2014   BUN 15 12/07/2014   CREATININE 1.30* 12/07/2014   GLUCOSE 107* 12/07/2014   No results found for: INR, PROTIME  Assessment/Plan: L4-5 spondylolisthesis, spinal stenosis, facet arthropathy, lumbago, lumbar radiculopathy, neurogenic claudication: I have discussed the situation with the patient and reviewed his imaging studies with him. We have discussed the various treatment options including surgery. I have described the surgical treatment option of an L4-5 decompression, instrumentation, and fusion. I have shown him surgical models. We have discussed the risks, benefits, alternatives, and likelihood of achieving our goals with surgery. I have answered all his questions. He has decided to proceed with surgery.   Blessings Inglett D 12/16/2014 9:25 AM

## 2014-12-16 NOTE — Plan of Care (Signed)
Problem: Consults Goal: Diagnosis - Spinal Surgery Outcome: Completed/Met Date Met:  12/16/14 Thoraco/Lumbar Spine Fusion

## 2014-12-16 NOTE — Anesthesia Procedure Notes (Signed)
Procedure Name: Intubation Date/Time: 12/16/2014 9:44 AM Performed by: Eligha Bridegroom Pre-anesthesia Checklist: Emergency Drugs available, Patient identified, Timeout performed, Suction available and Patient being monitored Patient Re-evaluated:Patient Re-evaluated prior to inductionOxygen Delivery Method: Circle system utilized Preoxygenation: Pre-oxygenation with 100% oxygen Intubation Type: IV induction Ventilation: Mask ventilation without difficulty and Oral airway inserted - appropriate to patient size Laryngoscope Size: Mac and 4 Grade View: Grade II Tube type: Oral Tube size: 7.5 mm Number of attempts: 1 Airway Equipment and Method: Stylet and LTA kit utilized Placement Confirmation: ETT inserted through vocal cords under direct vision,  breath sounds checked- equal and bilateral and positive ETCO2 Secured at: 22 cm Tube secured with: Tape Dental Injury: Teeth and Oropharynx as per pre-operative assessment

## 2014-12-16 NOTE — Transfer of Care (Signed)
Immediate Anesthesia Transfer of Care Note  Patient: [name]  Procedure(s) Performed: Procedure(s): LUMBAR FOUR-FIVE POSTERIOR LUMBAR INTERBODY FUSION WITH INTERBODY PROSTHESIS POSTERIOR LATERAL ARTHRODESIS AND POSTERIOR NONSEGMENTAL INSTRUMENTATION (N/A)  Patient Location: PACU  Anesthesia Type:General  Level of Consciousness: sedated and patient cooperative  Airway & Oxygen Therapy: Patient Spontanous Breathing and Patient connected to nasal cannula oxygen  Post-op Assessment: Report given to RN and Post -op Vital signs reviewed and stable  Post vital signs: Reviewed and stable  Last Vitals:  Filed Vitals:   12/16/14 0627  BP: 128/91  Temp: 36.2 C  Resp: 18    Complications: No apparent anesthesia complications

## 2014-12-16 NOTE — Progress Notes (Signed)
Subjective:  The patient is somnolent but easily arousable. He is in no apparent distress. He looks well.  Objective: Vital signs in last 24 hours: Temp:  [97.1 F (36.2 C)-98.2 F (36.8 C)] 98.2 F (36.8 C) (08/31 1440) Pulse Rate:  [78-84] 80 (08/31 1507) Resp:  [14-25] 15 (08/31 1507) BP: (127-131)/(72-91) 127/83 mmHg (08/31 1457) SpO2:  [96 %-98 %] 98 % (08/31 1507) Weight:  [104.327 kg (230 lb)] 104.327 kg (230 lb) (08/31 0627)  Intake/Output from previous day:   Intake/Output this shift: Total I/O In: 2800 [I.V.:2800] Out: 560 [Urine:305; Blood:255]  Physical exam the patient is somnolent but arousable. He is moving his lower extremities well.  Lab Results: No results for input(s): WBC, HGB, HCT, PLT in the last 72 hours. BMET No results for input(s): NA, K, CL, CO2, GLUCOSE, BUN, CREATININE, CALCIUM in the last 72 hours.  Studies/Results: Dg Lumbar Spine 2-3 Views  12/16/2014   CLINICAL DATA:  Posterior lumbar fusion at L4-5  EXAM: LUMBAR SPINE - 2-3 VIEW  COMPARISON:  MR lumbar spine of 07/08/2014  FINDINGS: Hardware for posterior fusion at L4-5 is present with interbody fusion plug at L4-5 in good position on the views obtained. No complicating features are seen. Normal alignment is maintained.  IMPRESSION: Posterior fusion at L4-5.   Electronically Signed   By: Ivar Drape M.D.   On: 12/16/2014 14:37   Dg Lumbar Spine 1 View  12/16/2014   CLINICAL DATA:  L4-L5 PLIF  EXAM: LUMBAR SPINE - 1 VIEW  COMPARISON:  Portable cross-table lateral view #1 at 1040 hours correlated with MRI lumbar spine of 07/08/2014  FINDINGS: Prior MRI labeled with 5 lumbar vertebra, current exam labeled accordingly.  Curve metallic probe via dorsal approach projects dorsal to the mid L4 level between the spinous processes of L3 and L4.  Mild scattered disc space narrowing.  Endplate spur formation T12-L1 and L1-L2.  Lumbar vertebral body heights maintained without fracture or subluxation.  Tissue  spreader seen dorsal to L4.  IMPRESSION: Dorsal localization of the mid L4 level.   Electronically Signed   By: Lavonia Dana M.D.   On: 12/16/2014 11:36   Dg C-arm 1-60 Min  12/16/2014   CLINICAL DATA:  L4-5 PLIF.  EXAM: DG C-ARM 61-120 MIN  FLUOROSCOPY TIME:  0 minutes 40 seconds, 2 images.  COMPARISON:  06/12/2014 and 12/16/2014  FINDINGS: Exam demonstrates mild spondylosis of the lumbar spine. No evidence of compression fracture or subluxation. Posterior fusion hardware is present at the L4-5 level with intervertebral cage at the L4-5 disc space. Hardware is intact. Recommend correlation with findings at the time of the procedure.  IMPRESSION: Posterior fusion hardware intact at the L4-5 level. Intervertebral cage at the L4-5 disc space.  Spondylosis of the lumbar spine.   Electronically Signed   By: Marin Olp M.D.   On: 12/16/2014 14:39    Assessment/Plan: The patient is doing well.  LOS: 0 days     [name] D 12/16/2014, 3:17 PM

## 2014-12-16 NOTE — Op Note (Signed)
Brief history: the patient is a 69 year old white male who has complained of back, buttock, and leg pain consistent with neurogenic claudication. He has failed medical management and was worked up with a lumbar MRI and lumbar x-rays. These demonstrated an L4-5 spondylolisthesis with spinal stenosis. I discussed the situation with the patient and his wife. I have reviewed the imaging studies with him and pointed out the abnormalities. We have discussed the various treatment options including surgery. He has weighed the risks, benefits, and alternative surgery and decided proceed with an L4-5 decompression, instrumentation, and fusion.  Preoperative diagnosis: L4-5 spondylolisthesis,Degenerative disc disease, spinal stenosis compressing both the  L4and the  L5 nerve roots; lumbago; lumbar radiculopathy  Postoperative diagnosis: as above  Procedure: bilateral L4Laminotomy/foraminotomies to decompress the bilateral L4 and L5nerve roots(the work required to do this was in addition to the work required to do the posterior lumbar interbody fusion because of the patient's spinal stenosis, facet arthropathy. Etc. requiring a wide decompression of the nerve roots.); L4-5 posterior lumbar interbody fusion with local morselized autograft bone and Kinnex graft extender; insertion of interbody prosthesis at L4-5(globus peek expandable interbody prosthesis); posterior nonsegmentalinstrumentation from L4 to L5with globus titanium pedicle screws and rods; posterior lateral arthrodesis at L4-5with local morselized autograft bone and Kinnex bone graft extender.  Surgeon: Dr. Earle Gell  Asst.: Dr. Sherley Bounds  Anesthesia: Gen. endotracheal  Estimated blood loss: 200 mL  Drains: One medium Hemovac  Complications: None  Description of procedure: The patient was brought to the operating room by the anesthesia team. General endotracheal anesthesia was induced. The patient was turned to the prone position on the  Wilson frame. The patient's lumbosacral region was then prepared with Betadine scrub and Betadine solution. Sterile drapes were applied.  I then injected the area to be incised with Marcaine with epinephrine solution. I then used the scalpel to make a linear midline incision over the L4-5interspace. I then used electrocautery to perform a bilateral subperiosteal dissection exposing the spinous process and lamina of L4 and L5. We then obtained intraoperative radiograph to confirm our location. We then inserted the Verstrac retractor to provide exposure.  I began the decompression by using the high speed drill to perform laminotomies at L4-5. We then used the Kerrison punches to widen the laminotomy and removed the ligamentum flavum at L4-5. We used the Kerrison punches to remove the medial facets at L4-5. We performed wide foraminotomies about the bilateral L4 and L5nerve roots completing the decompression.  We now turned our attention to the posterior lumbar interbody fusion. I used a scalpel to incise the intervertebral disc at L4-5. I then performed a partial intervertebral discectomy at L4-5using the pituitary forceps. We prepared the vertebral endplates at J5-7SVX the fusion by removing the soft tissues with the curettes. We then used the trial spacers to pick the appropriate sized interbody prosthesis. We prefilled his prosthesis with a combination of local morselized autograft bone that we obtained during the decompression as well as Kinnex bone graft extender. We inserted the prefilled prosthesis into the interspace at L4-5 and then expanded the prosthesis.. There was a good snug fit of the prosthesis in the interspace. We then filled and the remainder of the intervertebral disc space with local morselized autograft bone and Kinnex. This completed the posterior lumbar interbody arthrodesis.  We now turned attention to the instrumentation. Under fluoroscopic guidance we cannulated the bilateral L4 and  L5pedicles with the bone probe. We then removed the bone probe. We then tapped  the pedicle with a 6.76millimeter tap. We then removed the tap. We probed inside the tapped pedicle with a ball probe to rule out cortical breaches. We then inserted a 7.5 x 43millimeter pedicle screw into the  L4 and L5pedicles bilaterally under fluoroscopic guidance. We then palpated along the medial aspect of the pedicles to rule out cortical breaches. There were none. The nerve roots were not injured. We then connected the unilateral pedicle screws with a lordotic rod. We compressed the construct and secured the rod in place with the caps. We then tightened the caps appropriately. This completed the instrumentation from L4-5.  We now turned our attention to the posterior lateral arthrodesis at L4-5. We used the high-speed drill to decorticate the remainder of the facets, pars, transverse process at L4-5. We then applied a combination of local morselized autograft bone and Kinnex bone graft extender over these decorticated posterior lateral structures. This completed the posterior lateral arthrodesis.  We then obtained hemostasis using bipolar electrocautery. We irrigated the wound out with bacitracin solution. We inspected the thecal sac and nerve roots and noted they were well decompressed. We then removed the retractor. I placed vancomycin powder in the wound. We placed a medium Hemovac drain in the epidural space and tunneled out through separate stab wound. We reapproximated patient's thoracolumbar fascia with interrupted #1 Vicryl suture. We reapproximated patient's subcutaneous tissue with interrupted 2-0 Vicryl suture. The reapproximated patient's skin with Steri-Strips and benzoin. The wound was then coated with bacitracin ointment. A sterile dressing was applied. The drapes were removed. The patient was subsequently returned to the supine position where they were extubated by the anesthesia team. He was then transported to  the post anesthesia care unit in stable condition. All sponge instrument and needle counts were reportedly correct at the end of this case.

## 2014-12-16 NOTE — Anesthesia Postprocedure Evaluation (Signed)
  Anesthesia Post-op Note  Patient: [name]  Procedure(s) Performed: Procedure(s): LUMBAR FOUR-FIVE POSTERIOR LUMBAR INTERBODY FUSION WITH INTERBODY PROSTHESIS POSTERIOR LATERAL ARTHRODESIS AND POSTERIOR NONSEGMENTAL INSTRUMENTATION (N/A)  Patient Location: PACU  Anesthesia Type:General  Level of Consciousness: awake  Airway and Oxygen Therapy: Patient Spontanous Breathing  Post-op Pain: mild  Post-op Assessment: Post-op Vital signs reviewed, Patient's Cardiovascular Status Stable, Respiratory Function Stable, Patent Airway, No signs of Nausea or vomiting and Pain level controlled   LLE Sensation: Full sensation   RLE Sensation: Full sensation      Post-op Vital Signs: Reviewed and stable  Last Vitals:  Filed Vitals:   12/16/14 1602  BP:   Pulse: 80  Temp: 36.8 C  Resp: 19    Complications: No apparent anesthesia complications

## 2014-12-16 NOTE — Anesthesia Preprocedure Evaluation (Signed)
Anesthesia Evaluation  Patient identified by MRN, date of birth, ID band Patient awake    Reviewed: Allergy & Precautions, NPO status , Patient's Chart, lab work & pertinent test results  History of Anesthesia Complications Negative for: history of anesthetic complications  Airway Mallampati: II  TM Distance: >3 FB Neck ROM: Full    Dental  (+) Teeth Intact   Pulmonary neg pulmonary ROS,  breath sounds clear to auscultation        Cardiovascular negative cardio ROS  Rhythm:Regular     Neuro/Psych Low back pain with bilateral leg pain, no weakness    GI/Hepatic Neg liver ROS, GERD-  Medicated and Controlled,  Endo/Other  negative endocrine ROS  Renal/GU Renal InsufficiencyRenal disease     Musculoskeletal  (+) Arthritis -,   Abdominal   Peds  Hematology negative hematology ROS (+)   Anesthesia Other Findings   Reproductive/Obstetrics                             Anesthesia Physical Anesthesia Plan  ASA: II  Anesthesia Plan: General   Post-op Pain Management:    Induction: Intravenous  Airway Management Planned: Oral ETT  Additional Equipment: None  Intra-op Plan:   Post-operative Plan: Extubation in OR  Informed Consent: I have reviewed the patients History and Physical, chart, labs and discussed the procedure including the risks, benefits and alternatives for the proposed anesthesia with the patient or authorized representative who has indicated his/her understanding and acceptance.   Dental advisory given  Plan Discussed with: CRNA and Surgeon  Anesthesia Plan Comments:         Anesthesia Quick Evaluation

## 2014-12-17 LAB — CBC
HCT: 35.8 % — ABNORMAL LOW (ref 39.0–52.0)
Hemoglobin: 11.9 g/dL — ABNORMAL LOW (ref 13.0–17.0)
MCH: 30 pg (ref 26.0–34.0)
MCHC: 33.2 g/dL (ref 30.0–36.0)
MCV: 90.2 fL (ref 78.0–100.0)
Platelets: 145 10*3/uL — ABNORMAL LOW (ref 150–400)
RBC: 3.97 MIL/uL — ABNORMAL LOW (ref 4.22–5.81)
RDW: 12.8 % (ref 11.5–15.5)
WBC: 8 10*3/uL (ref 4.0–10.5)

## 2014-12-17 LAB — BASIC METABOLIC PANEL
Anion gap: 7 (ref 5–15)
BUN: 10 mg/dL (ref 6–20)
CO2: 27 mmol/L (ref 22–32)
Calcium: 7.9 mg/dL — ABNORMAL LOW (ref 8.9–10.3)
Chloride: 102 mmol/L (ref 101–111)
Creatinine, Ser: 1.48 mg/dL — ABNORMAL HIGH (ref 0.61–1.24)
GFR calc Af Amer: 54 mL/min — ABNORMAL LOW (ref >60)
GFR calc non Af Amer: 47 mL/min — ABNORMAL LOW (ref >60)
Glucose, Bld: 117 mg/dL — ABNORMAL HIGH (ref 65–99)
Potassium: 3.9 mmol/L (ref 3.5–5.1)
Sodium: 136 mmol/L (ref 135–145)

## 2014-12-17 MED ORDER — PANTOPRAZOLE SODIUM 40 MG PO TBEC
40.0000 mg | DELAYED_RELEASE_TABLET | Freq: Every day | ORAL | Status: DC
  Administered 2014-12-17: 40 mg via ORAL
  Filled 2014-12-17: qty 1

## 2014-12-17 MED FILL — Sodium Chloride IV Soln 0.9%: INTRAVENOUS | Qty: 1000 | Status: AC

## 2014-12-17 MED FILL — Heparin Sodium (Porcine) Inj 1000 Unit/ML: INTRAMUSCULAR | Qty: 30 | Status: AC

## 2014-12-17 NOTE — Progress Notes (Signed)
Utilization review completed.  

## 2014-12-17 NOTE — Evaluation (Signed)
Physical Therapy Evaluation Patient Details Name: [name] MRN: [id number] DOB: May 31, 1945 Today's Date: 12/17/2014   History of Present Illness  Lumbar Fusion ( has a past medical history of GERD (gastroesophageal reflux disease); Hyperlipidemia; Vertigo (10/13/2014); Insomnia; Arthritis; Joint pain; Hypertension; Heart murmur; History of bronchitis; and H/O urinary frequency.)  Clinical Impression  Patient evaluated by Physical Therapy with no further acute PT needs identified. All education has been completed and the patient has no further questions.  See below for any follow-up Physical Therapy or equipment needs. PT is signing off. Thank you for this referral.     Follow Up Recommendations Outpatient PT  The potential need for Outpatient PT can be addressed at Neurosurgery follow-up appointments.     Equipment Recommendations  None recommended by PT    Recommendations for Other Services       Precautions / Restrictions Precautions Precautions: Back Precaution Booklet Issued: Yes (comment) Required Braces or Orthoses: Spinal Brace Spinal Brace: Lumbar corset;Applied in sitting position      Mobility  Bed Mobility Overal bed mobility: Needs Assistance Bed Mobility: Rolling;Sidelying to Sit Rolling: Supervision Sidelying to sit: Supervision       General bed mobility comments: Cues for technique; slow moving, but did well; slightly increased pain  Transfers Overall transfer level: Needs assistance Equipment used: None Transfers: Sit to/from Stand Sit to Stand: Supervision         General transfer comment: Cues for back precautions  Ambulation/Gait Ambulation/Gait assistance: Supervision;Modified independent (Device/Increase time) Ambulation Distance (Feet): 300 Feet Assistive device: None Gait Pattern/deviations: Step-through pattern;Decreased stride length   Gait velocity interpretation: Below normal speed for age/gender General Gait Details:  Supervision progressing to mod I; Cues to self-monitor for activity tolerance  Stairs Stairs: Yes Stairs assistance: Min guard Stair Management: One rail Right;Alternating pattern;Forwards Number of Stairs: 12 General stair comments: Managing well  Wheelchair Mobility    Modified Rankin (Stroke Patients Only)       Balance Overall balance assessment: No apparent balance deficits (not formally assessed)                                           Pertinent Vitals/Pain Pain Assessment: 0-10 Pain Score: 2  Pain Location: back pain, surgical pain; reports shooting pain is MUCH improved postop Pain Descriptors / Indicators: Discomfort;Aching Pain Intervention(s): Monitored during session;Repositioned    Home Living Family/patient expects to be discharged to:: Private residence Living Arrangements: Spouse/significant other Available Help at Discharge: Family;Available 24 hours/day Type of Home: House Home Access: Stairs to enter Entrance Stairs-Rails: None Entrance Stairs-Number of Steps: 3 Home Layout: One level Home Equipment: None      Prior Function Level of Independence: Independent               Hand Dominance        Extremity/Trunk Assessment   Upper Extremity Assessment: Overall WFL for tasks assessed           Lower Extremity Assessment: Overall WFL for tasks assessed         Communication   Communication: No difficulties  Cognition Arousal/Alertness: Awake/alert Behavior During Therapy: WFL for tasks assessed/performed Overall Cognitive Status: Within Functional Limits for tasks assessed                      General Comments      Exercises  Assessment/Plan    PT Assessment All further PT needs can be met in the next venue of care  PT Diagnosis Acute pain   PT Problem List Pain  PT Treatment Interventions     PT Goals (Current goals can be found in the Care Plan section) Acute Rehab PT  Goals Patient Stated Goal: to get back to his [facility] PT Goal Formulation: All assessment and education complete, DC therapy    Frequency     Barriers to discharge        Co-evaluation               End of Session Equipment Utilized During Treatment: Back brace Activity Tolerance: Patient tolerated treatment well Patient left: in chair;with call bell/phone within reach Nurse Communication: Mobility status         Time: 0911-0935 PT Time Calculation (min) (ACUTE ONLY): 24 min   Charges:   PT Evaluation $Initial PT Evaluation Tier I: 1 Procedure PT Treatments $Gait Training: 8-22 mins   PT G Codes:        Quin Hoop 12/17/2014, 10:57 AM  Roney Marion, [place] Pager [phone] Office [phone]

## 2014-12-17 NOTE — Progress Notes (Signed)
Patient ID: [name], male   DOB: [birth year], 69 y.o.   MRN: [id number] Subjective:  The patient is alert and pleasant. He looks well. His legs feel better already. He wants to go home.  Objective: Vital signs in last 24 hours: Temp:  [98.2 F (36.8 C)-100.1 F (37.8 C)] 99 F (37.2 C) (09/01 1343) Pulse Rate:  [74-92] 82 (09/01 1343) Resp:  [7-25] 16 (09/01 1343) BP: (101-131)/(60-83) 119/68 mmHg (09/01 1343) SpO2:  [94 %-100 %] 94 % (09/01 1343)  Intake/Output from previous day: 08/31 0701 - 09/01 0700 In: 3040 [P.O.:240; I.V.:2800] Out: 5621 [Urine:1860; Drains:225; Blood:255] Intake/Output this shift: Total I/O In: -  Out: 200 [Urine:200]  Physical exam the patient is alert and oriented 3. His dressing is clean and dry. His strength is normal.  Lab Results:  Recent Labs  12/17/14 0720  WBC 8.0  HGB 11.9*  HCT 35.8*  PLT 145*   BMET  Recent Labs  12/17/14 0720  NA 136  K 3.9  CL 102  CO2 27  GLUCOSE 117*  BUN 10  CREATININE 1.48*  CALCIUM 7.9*    Studies/Results: Dg Lumbar Spine 2-3 Views  12/16/2014   CLINICAL DATA:  Posterior lumbar fusion at L4-5  EXAM: LUMBAR SPINE - 2-3 VIEW  COMPARISON:  MR lumbar spine of 07/08/2014  FINDINGS: Hardware for posterior fusion at L4-5 is present with interbody fusion plug at L4-5 in good position on the views obtained. No complicating features are seen. Normal alignment is maintained.  IMPRESSION: Posterior fusion at L4-5.   Electronically Signed   By: Ivar Drape M.D.   On: 12/16/2014 14:37   Dg Lumbar Spine 1 View  12/16/2014   CLINICAL DATA:  L4-L5 PLIF  EXAM: LUMBAR SPINE - 1 VIEW  COMPARISON:  Portable cross-table lateral view #1 at 1040 hours correlated with MRI lumbar spine of 07/08/2014  FINDINGS: Prior MRI labeled with 5 lumbar vertebra, current exam labeled accordingly.  Curve metallic probe via dorsal approach projects dorsal to the mid L4 level between the spinous processes of L3 and L4.  Mild  scattered disc space narrowing.  Endplate spur formation T12-L1 and L1-L2.  Lumbar vertebral body heights maintained without fracture or subluxation.  Tissue spreader seen dorsal to L4.  IMPRESSION: Dorsal localization of the mid L4 level.   Electronically Signed   By: Lavonia Dana M.D.   On: 12/16/2014 11:36   Dg C-arm 1-60 Min  12/16/2014   CLINICAL DATA:  L4-5 PLIF.  EXAM: DG C-ARM 61-120 MIN  FLUOROSCOPY TIME:  0 minutes 40 seconds, 2 images.  COMPARISON:  06/12/2014 and 12/16/2014  FINDINGS: Exam demonstrates mild spondylosis of the lumbar spine. No evidence of compression fracture or subluxation. Posterior fusion hardware is present at the L4-5 level with intervertebral cage at the L4-5 disc space. Hardware is intact. Recommend correlation with findings at the time of the procedure.  IMPRESSION: Posterior fusion hardware intact at the L4-5 level. Intervertebral cage at the L4-5 disc space.  Spondylosis of the lumbar spine.   Electronically Signed   By: Marin Olp M.D.   On: 12/16/2014 14:39    Assessment/Plan: Postop day #1: The patient is doing well neurologically. He will likely go home tomorrow.  Elevated creatinine: His creatinine has ranged from 1.3-1.5. I recommend we keep him in the hospital overnight and repeat his BMP in the morning.  LOS: 1 day     Brennen Gardiner D 12/17/2014, 2:41 PM

## 2014-12-18 LAB — BASIC METABOLIC PANEL
Anion gap: 5 (ref 5–15)
BUN: 10 mg/dL (ref 6–20)
CO2: 26 mmol/L (ref 22–32)
Calcium: 8.4 mg/dL — ABNORMAL LOW (ref 8.9–10.3)
Chloride: 104 mmol/L (ref 101–111)
Creatinine, Ser: 1.38 mg/dL — ABNORMAL HIGH (ref 0.61–1.24)
GFR calc Af Amer: 59 mL/min — ABNORMAL LOW (ref >60)
GFR calc non Af Amer: 51 mL/min — ABNORMAL LOW (ref >60)
Glucose, Bld: 116 mg/dL — ABNORMAL HIGH (ref 65–99)
Potassium: 4.2 mmol/L (ref 3.5–5.1)
Sodium: 135 mmol/L (ref 135–145)

## 2014-12-18 MED ORDER — DOCUSATE SODIUM 100 MG PO CAPS: 100.0000 mg | ORAL_CAPSULE | Freq: Two times a day (BID) | ORAL | Status: DC

## 2014-12-18 MED ORDER — CYCLOBENZAPRINE HCL 10 MG PO TABS: 10.0000 mg | ORAL_TABLET | Freq: Three times a day (TID) | ORAL | Status: DC | PRN

## 2014-12-18 MED ORDER — OXYCODONE-ACETAMINOPHEN 10-325 MG PO TABS: 1.0000 | ORAL_TABLET | ORAL | Status: DC | PRN

## 2014-12-18 NOTE — Care Management Important Message (Signed)
Important Message  Patient Details  Name: [name] MRN: [id number] Date of Birth: Dec 09, 1945   Medicare Important Message Given:  [facility] notification given    Nathen May 12/18/2014, 10:39 AMImportant Message  Patient Details  Name: [name] MRN: [id number] Date of Birth: 1945-06-18   Medicare Important Message Given:  Yes-second notification given    Nathen May 12/18/2014, 10:39 AM

## 2014-12-18 NOTE — Discharge Instructions (Signed)

## 2014-12-18 NOTE — Discharge Summary (Signed)
Physician Discharge Summary  Patient ID: [name] MRN: [id number] DOB/AGE: Apr 13, 1946 69 y.o.  Admit date: 12/16/2014 Discharge date: 12/18/2014  Admission Diagnoses: L4-5 spondylolisthesis, spinal stenosis, lumbago, lumbar radiculopathy, neurogenic claudication.  Discharge Diagnoses: The same Active Problems:   Spondylolisthesis of lumbar region   Discharged Condition: good  Hospital Course: I performed an L4-5 decompression, instrument patient, and fusion on the patient on 12/16/2014. The surgery went well.  The patient's postoperative course was remarkable only for an elevated creatinine. I repeated his creatinine today and it has returned to his baseline at 1.3.  The patient requested discharge to home. He was given oral and written discharge instructions. All his questions were answered.  Consults: Physical therapy Significant Diagnostic Studies: None Treatments: L4-5 decompression, agitation, and fusion. Discharge Exam: Blood pressure 139/79, pulse 80, temperature 99 F (37.2 C), temperature source Oral, resp. rate 18, weight 104.327 kg (230 lb), SpO2 95 %. The patient is alert and pleasant. He looks well. His strength is normal.  Disposition: Home  Discharge Instructions    Call MD for:  difficulty breathing, headache or visual disturbances    Complete by:  As directed      Call MD for:  extreme fatigue    Complete by:  As directed      Call MD for:  hives    Complete by:  As directed      Call MD for:  persistant dizziness or light-headedness    Complete by:  As directed      Call MD for:  persistant nausea and vomiting    Complete by:  As directed      Call MD for:  redness, tenderness, or signs of infection (pain, swelling, redness, odor or green/yellow discharge around incision site)    Complete by:  As directed      Call MD for:  severe uncontrolled pain    Complete by:  As directed      Call MD for:  temperature >100.4    Complete by:  As directed       Diet - low sodium heart healthy    Complete by:  As directed      Discharge instructions    Complete by:  As directed   Call [phone] for a followup appointment. Take a stool softener while you are using pain medications.     Driving Restrictions    Complete by:  As directed   Do not drive for 2 weeks.     Increase activity slowly    Complete by:  As directed      Lifting restrictions    Complete by:  As directed   Do not lift more than 5 pounds. No excessive bending or twisting.     May shower / Bathe    Complete by:  As directed   He may shower after the pain she is removed 3 days after surgery. Leave the incision alone.     Remove dressing in 24 hours    Complete by:  As directed             Medication List    STOP taking these medications        HYDROcodone-acetaminophen 5-325 MG per tablet  Commonly known as:  NORCO/VICODIN     ibuprofen 200 MG tablet  Commonly known as:  ADVIL,MOTRIN      TAKE these medications        cyclobenzaprine 10 MG tablet  Commonly known as:  FLEXERIL  Take 1 tablet (10 mg total) by mouth 3 (three) times daily as needed for muscle spasms.     docusate sodium 100 MG capsule  Commonly known as:  COLACE  Take 1 capsule (100 mg total) by mouth 2 (two) times daily.     loratadine 10 MG tablet  Commonly known as:  CLARITIN  Take 10 mg by mouth daily.     Melatonin 5 MG Tabs  Take 2.5 mg by mouth.     multivitamin capsule  Take 2 capsules by mouth daily. High protein multivitamin     oxyCODONE-acetaminophen 10-325 MG per tablet  Commonly known as:  PERCOCET  Take 1 tablet by mouth every 4 (four) hours as needed for pain.     ranitidine 150 MG tablet  Commonly known as:  ZANTAC  Take 150 mg by mouth 2 (two) times daily.         SignedNewman Pies D 12/18/2014, 8:07 AM

## 2014-12-18 NOTE — Progress Notes (Signed)
Pt doing well. Pt and wife given D/C instructions with Rx's, verbal understanding was provided. Pt's dressing was changed when Hemovac was removed. Pt's incision is clean and dry with no sign of infection. Pt's IV was removed prior to D/C. Pt D/C'd home via wheelchair @ 1025 per MD order. Pt is stable @ D/C and has no other needs at this time. Holli Humbles, RN

## 2014-12-19 ENCOUNTER — Encounter (HOSPITAL_COMMUNITY): Payer: Self-pay | Admitting: Emergency Medicine

## 2014-12-19 ENCOUNTER — Emergency Department (HOSPITAL_COMMUNITY)
Admission: EM | Admit: 2014-12-19 | Discharge: 2014-12-20 | Disposition: A | Discharge status: Home / Self Care | Payer: PPO | Attending: Emergency Medicine | Admitting: Emergency Medicine

## 2014-12-19 DIAGNOSIS — G8918 Other acute postprocedural pain: Secondary | ICD-10-CM | POA: Diagnosis not present

## 2014-12-19 DIAGNOSIS — Z79899 Other long term (current) drug therapy: Secondary | ICD-10-CM | POA: Insufficient documentation

## 2014-12-19 DIAGNOSIS — L03312 Cellulitis of back [any part except buttock]: Secondary | ICD-10-CM | POA: Insufficient documentation

## 2014-12-19 DIAGNOSIS — R509 Fever, unspecified: Secondary | ICD-10-CM

## 2014-12-19 DIAGNOSIS — I10 Essential (primary) hypertension: Secondary | ICD-10-CM | POA: Insufficient documentation

## 2014-12-19 DIAGNOSIS — IMO0001 Reserved for inherently not codable concepts without codable children: Secondary | ICD-10-CM

## 2014-12-19 DIAGNOSIS — T814XXA Infection following a procedure, initial encounter: Secondary | ICD-10-CM

## 2014-12-19 DIAGNOSIS — R011 Cardiac murmur, unspecified: Secondary | ICD-10-CM | POA: Diagnosis not present

## 2014-12-19 DIAGNOSIS — Z8639 Personal history of other endocrine, nutritional and metabolic disease: Secondary | ICD-10-CM | POA: Diagnosis not present

## 2014-12-19 DIAGNOSIS — G4700 Insomnia, unspecified: Secondary | ICD-10-CM | POA: Diagnosis not present

## 2014-12-19 DIAGNOSIS — M1990 Unspecified osteoarthritis, unspecified site: Secondary | ICD-10-CM | POA: Insufficient documentation

## 2014-12-19 DIAGNOSIS — K219 Gastro-esophageal reflux disease without esophagitis: Secondary | ICD-10-CM | POA: Diagnosis not present

## 2014-12-19 MED ORDER — ACETAMINOPHEN 325 MG PO TABS
650.0000 mg | ORAL_TABLET | Freq: Once | ORAL | Status: AC | PRN
  Administered 2014-12-20: 650 mg via ORAL
  Filled 2014-12-19: qty 2

## 2014-12-19 MED ORDER — SODIUM CHLORIDE 0.9 % IV BOLUS (SEPSIS)
1000.0000 mL | Freq: Once | INTRAVENOUS | Status: AC
  Administered 2014-12-19: 1000 mL via INTRAVENOUS

## 2014-12-19 NOTE — ED Provider Notes (Signed)
TIME SEEN: 11:58 PM   CHIEF COMPLAINT: post-op problem, fever, back pain  HPI: HPI Comments: [name] is a 69 y.o. male with a PMHx of HTN and HLD, as well as a PSHx of lumbar back surgery on 12/16/14 by Dr. Arnoldo Morale (see below), who presents to the Emergency Department complaining of gradual onset, constant, aching mid-back pain onset 1 day ago. He reports associated fever (Tmax 101.6), lack of energy, weakness, body aches.  Was discharged from the hospital 2 days ago. States fever started yesterday. Pt denies headache, neck pain, sore throat, air pain, abdominal pain, diarrhea, vomiting, rash, cough, numbness, focal weakness,bowel or bladder incontinence, and wound drainage at the surgical site. States his wife noticed redness around his surgical site. They took a picture of this area and sent it to their son who recommended he come immediately to the emergency department. They did not call their neurosurgeon.  Surgery 8/31: Preoperative diagnosis: L4-5 spondylolisthesis,Degenerative disc disease, spinal stenosis compressing both the L4and the L5 nerve roots; lumbago; lumbar radiculopathy  Procedure: bilateral L4Laminotomy/foraminotomies to decompress the bilateral L4 and L5nerve roots(the work required to do this was in addition to the work required to do the posterior lumbar interbody fusion because of the patient's spinal stenosis, facet arthropathy. Etc. requiring a wide decompression of the nerve roots.); L4-5 posterior lumbar interbody fusion with local morselized autograft bone and Kinnex graft extender; insertion of interbody prosthesis at L4-5(globus peek expandable interbody prosthesis); posterior nonsegmentalinstrumentation from L4 to L5with globus titanium pedicle screws and rods; posterior lateral arthrodesis at L4-5with local morselized autograft bone and Kinnex bone graft extender.  ROS: See HPI Constitutional: fever  Eyes: no drainage  ENT: no runny nose   Cardiovascular:   no chest pain  Resp: no SOB  GI: no vomiting GU: no dysuria Integumentary: no rash  Allergy: no hives  Musculoskeletal: no leg swelling  Neurological: no slurred speech ROS otherwise negative  PAST MEDICAL HISTORY/PAST SURGICAL HISTORY:  Past Medical History  Diagnosis Date  . GERD (gastroesophageal reflux disease)   . Hyperlipidemia   . Vertigo 10/13/2014    recurrent; trigger is cool air on left ear  . Insomnia   . Arthritis   . Joint pain   . Hypertension     pt. states borderline   . Heart murmur     as a child  . History of bronchitis   . H/O urinary frequency     MEDICATIONS:  Prior to Admission medications   Medication Sig Start Date End Date Taking? Authorizing Provider  cyclobenzaprine (FLEXERIL) 10 MG tablet Take 1 tablet (10 mg total) by mouth 3 (three) times daily as needed for muscle spasms. 12/18/14   Newman Pies, MD  docusate sodium (COLACE) 100 MG capsule Take 1 capsule (100 mg total) by mouth 2 (two) times daily. 12/18/14   Newman Pies, MD  loratadine (CLARITIN) 10 MG tablet Take 10 mg by mouth daily.    Historical Provider, MD  Melatonin 5 MG TABS Take 2.5 mg by mouth.    Historical Provider, MD  Multiple Vitamin (MULTIVITAMIN) capsule Take 2 capsules by mouth daily. High protein multivitamin    Historical Provider, MD  oxyCODONE-acetaminophen (PERCOCET) 10-325 MG per tablet Take 1 tablet by mouth every 4 (four) hours as needed for pain. 12/18/14   Newman Pies, MD  ranitidine (ZANTAC) 150 MG tablet Take 150 mg by mouth 2 (two) times daily.    Historical Provider, MD    ALLERGIES:  Allergies  Allergen Reactions  .  Morphine And Related Hives    hallucinations    SOCIAL HISTORY:  Social History  Substance Use Topics  . Smoking status: Never Smoker   . Smokeless tobacco: Not on file  . Alcohol Use: No    FAMILY HISTORY: Family History  Problem Relation Age of Onset  . Hypertension Mother   . Stroke Mother     >76  . Asthma Mother   .  Diabetes Father   . Heart attack Paternal Grandfather     >55  . Cancer Neg Hx     EXAM: BP 142/65 mmHg  Pulse 95  Temp(Src) 101.3 F (38.5 C) (Oral)  Resp 18  SpO2 94% CONSTITUTIONAL: Alert and oriented and responds appropriately to questions. Well-appearing; well-nourished, febrile but nontoxic appearing, no significant distress HEAD: Normocephalic EYES: Conjunctivae clear, PERRL ENT: normal nose; no rhinorrhea; moist mucous membranes; pharynx without lesions noted NECK: Supple, no meningismus, no LAD  CARD: RRR; S1 and S2 appreciated; no murmurs, no clicks, no rubs, no gallops RESP: Normal chest excursion without splinting or tachypnea; breath sounds clear and equal bilaterally; no wheezes, no rhonchi, no rales, no hypoxia or respiratory distress, speaking full sentences ABD/GI: Normal bowel sounds; non-distended; soft, non-tender, no rebound, no guarding, no peritoneal signs BACK:  The back appears normal and is tender to palpation over the lumbar spine over a 7 cm surgical incision that has a 1 cm surrounding area of erythema and warmth, there is a 1 cm area of the incision site that is slightly open without drainage or bleeding, no induration or fluctuance EXT: Normal ROM in all joints; non-tender to palpation; no edema; normal capillary refill; no cyanosis, no calf tenderness or swelling    SKIN: Normal color for age and race; warm NEURO: Moves all extremities equally, sensation to light touch intact diffusely, cranial nerves II through XII intact, no pronator drift, 2+ deep tendon reflexes, no clonus, normal gait PSYCH: The patient's mood and manner are appropriate. Grooming and personal hygiene are appropriate.  MEDICAL DECISION MAKING: Patient here with postoperative fever. He is postop day 3.  Will obtain labs, urine, chest x-ray. He does have a small amount of what appears to be cellulitis around his surgical incision. We'll give vancomycin. He is hemodynamically stable.   Patient reports he is having pain but pain seems appropriate after the surgery patient had. He is currently neurologically intact.  ED PROGRESS: Patient's labs are unremarkable. No leukocytosis. Negative lactate. Urine shows trace hemoglobin but no other sign of infection.chest x-ray clear. Blood cultures pending. Will consult neurosurgery on call for further recommendations.   3:00 AM  D/w Dr. Hal Neer who is on-call for neurosurgery.  He states he would be incredibly rare to have post operative wound infection within 3 days. Recurrence patient continue incentive spirometry. Recommends discharging the patient on Keflex. Recommend follow-up with Dr. Arnoldo Morale next week. Patient is not septic.  Pain controlled after one dose of IV Dilaudid. Discussed return precautions. Patient and family verbalize understanding and are comfortable with this plan.    I personally performed the services described in this documentation, which was scribed in my presence. The recorded information has been reviewed and is accurate.'   [place], DO 12/20/14 [phone]

## 2014-12-19 NOTE — ED Notes (Signed)
Dr. Ward at bedside.

## 2014-12-19 NOTE — ED Notes (Signed)
Patient here with complaint of severe mid back pain and fever. Reports that he had a spinal fusion done Wed 8/31. Had little to no pain after procedure and was ambulatory without pain on Thursday. On Friday he began to have pain and it became worse. Also reports diffuse joint pain and body aches.

## 2014-12-20 ENCOUNTER — Emergency Department (HOSPITAL_COMMUNITY): Payer: PPO

## 2014-12-20 LAB — URINALYSIS, ROUTINE W REFLEX MICROSCOPIC
Bilirubin Urine: NEGATIVE
Glucose, UA: NEGATIVE mg/dL
Ketones, ur: NEGATIVE mg/dL
Leukocytes, UA: NEGATIVE
Nitrite: NEGATIVE
Protein, ur: NEGATIVE mg/dL
Specific Gravity, Urine: 1.013 (ref 1.005–1.030)
Urobilinogen, UA: 0.2 mg/dL (ref 0.0–1.0)
pH: 6 (ref 5.0–8.0)

## 2014-12-20 LAB — COMPREHENSIVE METABOLIC PANEL
ALT: 23 U/L (ref 17–63)
AST: 39 U/L (ref 15–41)
Albumin: 3.3 g/dL — ABNORMAL LOW (ref 3.5–5.0)
Alkaline Phosphatase: 46 U/L (ref 38–126)
Anion gap: 8 (ref 5–15)
BUN: 10 mg/dL (ref 6–20)
CO2: 24 mmol/L (ref 22–32)
Calcium: 8.3 mg/dL — ABNORMAL LOW (ref 8.9–10.3)
Chloride: 102 mmol/L (ref 101–111)
Creatinine, Ser: 1.25 mg/dL — ABNORMAL HIGH (ref 0.61–1.24)
GFR calc Af Amer: >60 mL/min (ref >60)
GFR calc non Af Amer: 57 mL/min — ABNORMAL LOW (ref >60)
Glucose, Bld: 160 mg/dL — ABNORMAL HIGH (ref 65–99)
Potassium: 3.7 mmol/L (ref 3.5–5.1)
Sodium: 134 mmol/L — ABNORMAL LOW (ref 135–145)
Total Bilirubin: 0.7 mg/dL (ref 0.3–1.2)
Total Protein: 7 g/dL (ref 6.5–8.1)

## 2014-12-20 LAB — CBC
HCT: 34.1 % — ABNORMAL LOW (ref 39.0–52.0)
Hemoglobin: 11.7 g/dL — ABNORMAL LOW (ref 13.0–17.0)
MCH: 30.7 pg (ref 26.0–34.0)
MCHC: 34.3 g/dL (ref 30.0–36.0)
MCV: 89.5 fL (ref 78.0–100.0)
Platelets: 174 10*3/uL (ref 150–400)
RBC: 3.81 MIL/uL — ABNORMAL LOW (ref 4.22–5.81)
RDW: 12.6 % (ref 11.5–15.5)
WBC: 8.9 10*3/uL (ref 4.0–10.5)

## 2014-12-20 LAB — URINE MICROSCOPIC-ADD ON

## 2014-12-20 LAB — I-STAT CG4 LACTIC ACID, ED
Lactic Acid, Venous: 0.34 mmol/L — ABNORMAL LOW (ref 0.5–2.0)
Lactic Acid, Venous: 1.54 mmol/L (ref 0.5–2.0)

## 2014-12-20 MED ORDER — VANCOMYCIN HCL IN DEXTROSE 750-5 MG/150ML-% IV SOLN
750.0000 mg | Freq: Two times a day (BID) | INTRAVENOUS | Status: DC
Start: 2014-12-20 — End: 2014-12-20
  Filled 2014-12-20: qty 150

## 2014-12-20 MED ORDER — VANCOMYCIN HCL 10 G IV SOLR
1500.0000 mg | Freq: Once | INTRAVENOUS | Status: AC
  Administered 2014-12-20: 1500 mg via INTRAVENOUS
  Filled 2014-12-20: qty 1500

## 2014-12-20 MED ORDER — HYDROMORPHONE HCL 1 MG/ML IJ SOLN
1.0000 mg | Freq: Once | INTRAMUSCULAR | Status: AC
  Administered 2014-12-20: 1 mg via INTRAVENOUS
  Filled 2014-12-20: qty 1

## 2014-12-20 MED ORDER — CEPHALEXIN 500 MG PO CAPS: 500.0000 mg | ORAL_CAPSULE | Freq: Four times a day (QID) | ORAL | Status: DC

## 2014-12-20 MED ORDER — CEPHALEXIN 250 MG PO CAPS
500.0000 mg | ORAL_CAPSULE | Freq: Once | ORAL | Status: AC
  Administered 2014-12-20: 500 mg via ORAL
  Filled 2014-12-20: qty 2

## 2014-12-20 NOTE — ED Notes (Signed)
Discharge instructions and prescriptions reviewed, voiced understanding. 

## 2014-12-20 NOTE — Discharge Instructions (Signed)
Possible early Cellulitis Cellulitis is an infection of the skin and the tissue beneath it. The infected area is usually red and tender. Cellulitis occurs most often in the arms and lower legs.  CAUSES  Cellulitis is caused by bacteria that enter the skin through cracks or cuts in the skin. The most common types of bacteria that cause cellulitis are staphylococci and streptococci. SIGNS AND SYMPTOMS   Redness and warmth.  Swelling.  Tenderness or pain.  Fever. DIAGNOSIS  Your health care provider can usually determine what is wrong based on a physical exam. Blood tests may also be done. TREATMENT  Treatment usually involves taking an antibiotic medicine. HOME CARE INSTRUCTIONS   Take your antibiotic medicine as directed by your health care provider. Finish the antibiotic even if you start to feel better.  Keep the infected arm or leg elevated to reduce swelling.  Apply a warm cloth to the affected area up to 4 times per day to relieve pain.  Take medicines only as directed by your health care provider.  Keep all follow-up visits as directed by your health care provider. SEEK MEDICAL CARE IF:   You notice red streaks coming from the infected area.  Your red area gets larger or turns dark in color.  Your bone or joint underneath the infected area becomes painful after the skin has healed.  Your infection returns in the same area or another area.  You notice a swollen bump in the infected area.  You develop new symptoms.  You have a fever. SEEK IMMEDIATE MEDICAL CARE IF:   You feel very sleepy.  You develop vomiting or diarrhea.  You have a general ill feeling (malaise) with muscle aches and pains. MAKE SURE YOU:   Understand these instructions.  Will watch your condition.  Will get help right away if you are not doing well or get worse. Document Released: 01/11/2005 Document Revised: 08/18/2013 Document Reviewed: 06/19/2011 [facility] Patient Information 2015  [place], [place]. This information is not intended to replace advice given to you by your health care provider. Make sure you discuss any questions you have with your health care provider.  Fever, Adult A fever is a higher than normal body temperature. In an adult, an oral temperature around 98.6 F (37 C) is considered normal. A temperature of 100.4 F (38 C) or higher is generally considered a fever. Mild or moderate fevers generally have no long-term effects and often do not require treatment. Extreme fever (greater than or equal to 106 F or 41.1 C) can cause seizures. The sweating that may occur with repeated or prolonged fever may cause dehydration. Elderly people can develop confusion during a fever. A measured temperature can vary with:  Age.  Time of day.  Method of measurement (mouth, underarm, rectal, or ear). The fever is confirmed by taking a temperature with a thermometer. Temperatures can be taken different ways. Some methods are accurate and some are not.  An oral temperature is used most commonly. Electronic thermometers are fast and accurate.  An ear temperature will only be accurate if the thermometer is positioned as recommended by the manufacturer.  A rectal temperature is accurate and done for those adults who have a condition where an oral temperature cannot be taken.  An underarm (axillary) temperature is not accurate and not recommended. Fever is a symptom, not a disease.  CAUSES   Infections commonly cause fever.  Some noninfectious causes for fever include:  Some arthritis conditions.  Some thyroid or adrenal  gland conditions.  Some immune system conditions.  Some types of cancer.  A medicine reaction.  High doses of certain street drugs such as methamphetamine.  Dehydration.  Exposure to high outside or room temperatures.  Occasionally, the source of a fever cannot be determined. This is sometimes called a "fever of unknown origin" (FUO).  Some  situations may lead to a temporary rise in body temperature that may go away on its own. Examples are:  Childbirth.  Surgery.  Intense exercise. HOME CARE INSTRUCTIONS   Take appropriate medicines for fever. Follow dosing instructions carefully. If you use acetaminophen to reduce the fever, be careful to avoid taking other medicines that also contain acetaminophen. Do not take aspirin for a fever if you are younger than age 39. There is an association with Reye's syndrome. Reye's syndrome is a rare but potentially deadly disease.  If an infection is present and antibiotics have been prescribed, take them as directed. Finish them even if you start to feel better.  Rest as needed.  Maintain an adequate fluid intake. To prevent dehydration during an illness with prolonged or recurrent fever, you may need to drink extra fluid.Drink enough fluids to keep your urine clear or pale yellow.  Sponging or bathing with room temperature water may help reduce body temperature. Do not use ice water or alcohol sponge baths.  Dress comfortably, but do not over-bundle. SEEK MEDICAL CARE IF:   You are unable to keep fluids down.  You develop vomiting or diarrhea.  You are not feeling at least partly better after 3 days.  You develop new symptoms or problems. SEEK IMMEDIATE MEDICAL CARE IF:   You have shortness of breath or trouble breathing.  You develop excessive weakness.  You are dizzy or you faint.  You are extremely thirsty or you are making little or no urine.  You develop new pain that was not there before (such as in the head, neck, chest, back, or abdomen).  You have persistent vomiting and diarrhea for more than 1 to 2 days.  You develop a stiff neck or your eyes become sensitive to light.  You develop a skin rash.  You have a fever or persistent symptoms for more than 2 to 3 days.  You have a fever and your symptoms suddenly get worse. MAKE SURE YOU:   Understand these  instructions.  Will watch your condition.  Will get help right away if you are not doing well or get worse. Document Released: 09/27/2000 Document Revised: 08/18/2013 Document Reviewed: 02/02/2011 [facility] Patient Information 2015 [place], [place]. This information is not intended to replace advice given to you by your health care provider. Make sure you discuss any questions you have with your health care provider.

## 2014-12-20 NOTE — Progress Notes (Signed)
ANTIBIOTIC CONSULT NOTE - INITIAL  Pharmacy Consult for Vancomycin  Indication: Possible wound infection  Allergies  Allergen Reactions  . Morphine And Related Hives    hallucinations    Patient Measurements: ~104 kg  Vital Signs: Temp: 101.3 F (38.5 C) (09/03 2324) Temp Source: Oral (09/03 2324) BP: 142/65 mmHg (09/03 2324) Pulse Rate: 95 (09/03 2324)  Labs:  Recent Labs  12/17/14 0720 12/18/14 0443  WBC 8.0  --   HGB 11.9*  --   PLT 145*  --   CREATININE 1.48* 1.38*   Estimated Creatinine Clearance: 67 mL/min (by C-G formula based on Cr of 1.38).   Microbiology: Recent Results (from the past 720 hour(s))  Surgical pcr screen     Status: None   Collection Time: 12/07/14  9:34 AM  Result Value Ref Range Status   MRSA, PCR NEGATIVE NEGATIVE Final   Staphylococcus aureus NEGATIVE NEGATIVE Final    Comment:        The Xpert SA Assay (FDA approved for NASAL specimens in patients over 87 years of age), is one component of a comprehensive surveillance program.  Test performance has been validated by [facility] for patients greater than or equal to 15 year old. It is not intended to diagnose infection nor to guide or monitor treatment.     Medical History: Past Medical History  Diagnosis Date  . GERD (gastroesophageal reflux disease)   . Hyperlipidemia   . Vertigo 10/13/2014    recurrent; trigger is cool air on left ear  . Insomnia   . Arthritis   . Joint pain   . Hypertension     pt. states borderline   . Heart murmur     as a child  . History of bronchitis   . H/O urinary frequency     Assessment: 69 y/o M with recent neurosurgical procedure on 8/31, here with severe back pain and fever, WBC WNL, noted renal dysfunction, other labs as above.   Goal of Therapy:  Vancomycin trough level 15-20 mcg/ml  Plan:  -Vancomycin 1500 mg IV x 1, then 750 mg IV q12h -Drug levels as indicated   Narda Bonds 12/20/2014,12:10 AM

## 2014-12-21 LAB — URINE CULTURE: Culture: NO GROWTH

## 2014-12-25 LAB — CULTURE, BLOOD (ROUTINE X 2)
Culture: NO GROWTH
Culture: NO GROWTH

## 2015-01-01 ENCOUNTER — Ambulatory Visit (INDEPENDENT_AMBULATORY_CARE_PROVIDER_SITE_OTHER): Payer: PPO | Admitting: Family Medicine

## 2015-01-01 VITALS — BP 130/80 | HR 91 | Temp 98.3°F | Resp 16 | Ht 74.0 in | Wt 226.0 lb

## 2015-01-01 DIAGNOSIS — R339 Retention of urine, unspecified: Secondary | ICD-10-CM

## 2015-01-01 DIAGNOSIS — R300 Dysuria: Secondary | ICD-10-CM

## 2015-01-01 DIAGNOSIS — R350 Frequency of micturition: Secondary | ICD-10-CM | POA: Diagnosis not present

## 2015-01-01 DIAGNOSIS — N410 Acute prostatitis: Secondary | ICD-10-CM | POA: Diagnosis not present

## 2015-01-01 LAB — POCT URINALYSIS DIPSTICK
Bilirubin, UA: NEGATIVE
Blood, UA: NEGATIVE
Glucose, UA: NEGATIVE
Ketones, UA: NEGATIVE
Leukocytes, UA: NEGATIVE
Nitrite, UA: NEGATIVE
Protein, UA: NEGATIVE
Spec Grav, UA: 1.02
Urobilinogen, UA: 0.2
pH, UA: 6.5

## 2015-01-01 LAB — POCT UA - MICROSCOPIC ONLY
Bacteria, U Microscopic: NEGATIVE
Casts, Ur, LPF, POC: NEGATIVE
Crystals, Ur, HPF, POC: NEGATIVE
Epithelial cells, urine per micros: NEGATIVE
Mucus, UA: NEGATIVE
RBC, urine, microscopic: NEGATIVE
WBC, Ur, HPF, POC: NEGATIVE
Yeast, UA: NEGATIVE

## 2015-01-01 MED ORDER — CIPROFLOXACIN HCL 500 MG PO TABS: 500.0000 mg | ORAL_TABLET | Freq: Two times a day (BID) | ORAL | Status: DC

## 2015-01-01 MED ORDER — TAMSULOSIN HCL 0.4 MG PO CAPS: 0.4000 mg | ORAL_CAPSULE | Freq: Every day | ORAL | Status: DC

## 2015-01-01 NOTE — Patient Instructions (Signed)
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 I'm giving her 2 different medications to help shrink down the prostate: 1-an antibiotic 2-a prostate size reduction agent.  The first can cause some upset stomach. It should be taken twice a day with food.  The second medicine is called Flomax and this can lead to lightheadedness when getting up quickly. It lowers the blood pressure. It also lowers the pressure bladder applies to the urethra. You should be able to stop the Flomax in 7 days. At that point the antibiotic should've done enough good to make elimination of urine easy.  I need to know if you're still having symptoms on Sunday because if you are I will have you see a specialist the first thing Monday morning.

## 2015-01-01 NOTE — Progress Notes (Addendum)
Patient ID: [name], male   DOB: February 20, 1946, 69 y.o.   MRN: [id number]  This chart was scribed for Tyler Haber, MD by Ladene Artist, ED Scribe. The patient was seen in room 8. Patient's care was started at 10:55 AM.  Patient ID: [name] MRN: [id number], DOB: 12-01-1945, 69 y.o. Date of Encounter: 01/01/2015, 10:55 AM  Primary Physician: Tyler Cobble, MD  Chief Complaint  Patient presents with  . painful urination    burning/ x 2 days    HPI: 69 y.o. year old male with history below presents with gradually worsening dysuria for the past 3 days. Pt had back surgery for saddle paresthesias by Dr. Arnoldo Hayden on 12/16/14. He had a catheter inserted on 12/16/14 that was removed on 12/17/14. He was placed on antibiotics x4 daily that he stopped 5 days ago. Pt reports associated mild abdominal pain with palpation, difficulty urinating and urinary frequency for the past 3 days. He further reports that he typically gets up x4 nightly to urinate, but this has recently increased to x6 nightly. Pt denies incomplete emptying and lower extremity pain.   Past Medical History  Diagnosis Date  . GERD (gastroesophageal reflux disease)   . Hyperlipidemia   . Vertigo 10/13/2014    recurrent; trigger is cool air on left ear  . Insomnia   . Arthritis   . Joint pain   . Hypertension     pt. states borderline   . Heart murmur     as a child  . History of bronchitis   . H/O urinary frequency      Home Meds: Prior to Admission medications   Medication Sig Start Date End Date Taking? Authorizing Provider  cyclobenzaprine (FLEXERIL) 10 MG tablet Take 1 tablet (10 mg total) by mouth 3 (three) times daily as needed for muscle spasms. 12/18/14  Yes Newman Pies, MD  cephALEXin (KEFLEX) 500 MG capsule Take 1 capsule (500 mg total) by mouth 4 (four) times daily. Patient not taking: Reported on 01/01/2015 12/20/14   Delice Bison Ward, DO  docusate sodium (COLACE) 100 MG capsule Take 1 capsule (100 mg  total) by mouth 2 (two) times daily. Patient not taking: Reported on 01/01/2015 12/18/14   Newman Pies, MD  HYDROcodone-acetaminophen (NORCO/VICODIN) 5-325 MG per tablet Take 1 tablet by mouth every 6 (six) hours as needed for moderate pain.    Historical Provider, MD  loratadine (CLARITIN) 10 MG tablet Take 10 mg by mouth daily.    Historical Provider, MD  Melatonin 5 MG TABS Take 2.5 mg by mouth at bedtime.     Historical Provider, MD  Multiple Vitamin (MULTIVITAMIN) capsule Take 2 capsules by mouth daily. High protein multivitamin    Historical Provider, MD  omeprazole (PRILOSEC) 20 MG capsule Take 20 mg by mouth daily as needed (acid reflux).    Historical Provider, MD  oxyCODONE-acetaminophen (PERCOCET) 10-325 MG per tablet Take 1 tablet by mouth every 4 (four) hours as needed for pain. Patient not taking: Reported on 12/20/2014 12/18/14   Newman Pies, MD  ranitidine (ZANTAC) 150 MG tablet Take 150 mg by mouth daily as needed for heartburn.     Historical Provider, MD    Allergies:  Allergies  Allergen Reactions  . Morphine And Related Hives    hallucinations    Social History   Social History  . Marital Status: Married    Spouse Name: N/A  . Number of Children: N/A  . Years of Education: N/A   Occupational  History  . Not on file.   Social History Main Topics  . Smoking status: Never Smoker   . Smokeless tobacco: Not on file  . Alcohol Use: No  . Drug Use: No  . Sexual Activity: Not on file   Other Topics Concern  . Not on file   Social History Narrative    Review of Systems: Constitutional: negative for chills, fever, night sweats, weight changes, or fatigue  HEENT: negative for vision changes, hearing loss, congestion, rhinorrhea, ST, epistaxis, or sinus pressure Cardiovascular: negative for chest pain or palpitations Respiratory: negative for hemoptysis, wheezing, shortness of breath, or cough Abdominal: negative for nausea, vomiting, diarrhea, or constipation,   + abdominal pain (mild) Msk: negative for leg pain, + back pain,  GU: + dysuria, + difficulty urinating, + urinary frequency  Dermatological: negative for rash Neurologic: negative for headache, dizziness, or syncope All other systems reviewed and are otherwise negative with the exception to those above and in the HPI.   Physical Exam: Blood pressure 130/80, pulse 91, temperature 98.3 F (36.8 C), temperature source Oral, resp. rate 16, height 6\' 2"  (1.88 m), weight 226 lb (102.513 kg), SpO2 98 %., Body mass index is 29 kg/(m^2). General: Well developed, well nourished, in no acute distress. Head: Normocephalic, atraumatic, eyes without discharge, sclera non-icteric, nares are without discharge. Bilateral auditory canals clear, TM's are without perforation, pearly grey and translucent with reflective cone of light bilaterally. Oral cavity moist, posterior pharynx without exudate, erythema, peritonsillar abscess, or post nasal drip.  Neck: Supple. No thyromegaly. Full ROM. No lymphadenopathy. Lungs: Clear bilaterally to auscultation without wheezes, rales, or rhonchi. Breathing is unlabored. Heart: RRR with S1 S2. No murmurs, rubs, or gallops appreciated. Abdomen: Soft, non-distended with normoactive bowel sounds. No hepatomegaly. No rebound/guarding. No obvious abdominal masses. Fullness in suprapubic region which is also tender.  Genitalia normal Msk:  Strength and tone normal for age. Extremities/Skin: Warm and dry. No clubbing or cyanosis. No edema. No rashes or suspicious lesions. GU: Enlarged prostate and boggy.  Neuro: Alert and oriented X 3. Moves all extremities spontaneously. Gait is normal. CNII-XII grossly in tact. Psych:  Responds to questions appropriately with a normal affect.   Labs: Results for orders placed or performed in visit on 01/01/15  POCT UA - Microscopic Only  Result Value Ref Range   WBC, Ur, HPF, POC neg    RBC, urine, microscopic neg    Bacteria, U  Microscopic neg    Mucus, UA neg    Epithelial cells, urine per micros neg    Crystals, Ur, HPF, POC neg    Casts, Ur, LPF, POC neg    Yeast, UA neg   POCT urinalysis dipstick  Result Value Ref Range   Color, UA yellow    Clarity, UA clear    Glucose, UA neg    Bilirubin, UA neg    Ketones, UA neg    Spec Grav, UA 1.020    Blood, UA neg    pH, UA 6.5    Protein, UA neg    Urobilinogen, UA 0.2    Nitrite, UA neg    Leukocytes, UA Negative Negative     ASSESSMENT AND PLAN:  69 y.o. year old male with  1. Burning with urination   2. Urinary frequency   3. Urinary retention   4. Acute prostatitis    This chart was scribed in my presence and reviewed by me personally.    ICD-9-CM ICD-10-CM   1. Burning  with urination 788.1 R30.0 POCT UA - Microscopic Only     POCT urinalysis dipstick     tamsulosin (FLOMAX) 0.4 MG CAPS capsule     Urine culture     ciprofloxacin (CIPRO) 500 MG tablet  2. Urinary frequency 788.41 R35.0 Urine culture     ciprofloxacin (CIPRO) 500 MG tablet  3. Urinary retention 788.20 R33.9 tamsulosin (FLOMAX) 0.4 MG CAPS capsule     ciprofloxacin (CIPRO) 500 MG tablet  4. 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 I'm giving her 2 different medications to help shrink down the prostate: 1-an antibiotic 2-a prostate size reduction agent.  The first can cause some upset stomach. It should be taken twice a day with food.  The second medicine is called Flomax and this can lead to lightheadedness when getting up quickly. It lowers the blood pressure. It also lowers the pressure bladder applies to the urethra. You should be able to stop the Flomax in 7 days. At that point the antibiotic should've done enough good to make elimination of urine  easy.  I need to know if you're still having symptoms on Sunday because if you are I will have you see a specialist the first thing Monday morning.

## 2015-01-02 LAB — URINE CULTURE
Colony Count: NO GROWTH
Organism ID, Bacteria: NO GROWTH

## 2015-04-20 DIAGNOSIS — M4316 Spondylolisthesis, lumbar region: Secondary | ICD-10-CM | POA: Diagnosis not present

## 2015-04-20 DIAGNOSIS — R030 Elevated blood-pressure reading, without diagnosis of hypertension: Secondary | ICD-10-CM | POA: Diagnosis not present

## 2015-04-20 DIAGNOSIS — M545 Low back pain: Secondary | ICD-10-CM | POA: Diagnosis not present

## 2015-04-20 DIAGNOSIS — Z6831 Body mass index (BMI) 31.0-31.9, adult: Secondary | ICD-10-CM | POA: Diagnosis not present

## 2015-04-21 DIAGNOSIS — L309 Dermatitis, unspecified: Secondary | ICD-10-CM | POA: Diagnosis not present

## 2015-04-21 DIAGNOSIS — L821 Other seborrheic keratosis: Secondary | ICD-10-CM | POA: Diagnosis not present

## 2015-04-21 DIAGNOSIS — D225 Melanocytic nevi of trunk: Secondary | ICD-10-CM | POA: Diagnosis not present

## 2015-04-21 DIAGNOSIS — D2371 Other benign neoplasm of skin of right lower limb, including hip: Secondary | ICD-10-CM | POA: Diagnosis not present

## 2015-04-21 DIAGNOSIS — D3613 Benign neoplasm of peripheral nerves and autonomic nervous system of lower limb, including hip: Secondary | ICD-10-CM | POA: Diagnosis not present

## 2015-04-22 ENCOUNTER — Telehealth: Payer: Self-pay | Admitting: Internal Medicine

## 2015-04-22 ENCOUNTER — Ambulatory Visit (INDEPENDENT_AMBULATORY_CARE_PROVIDER_SITE_OTHER): Payer: PPO | Admitting: Family Medicine

## 2015-04-22 ENCOUNTER — Encounter: Payer: Self-pay | Admitting: Family Medicine

## 2015-04-22 VITALS — BP 164/104 | HR 103 | Temp 98.4°F | Ht 74.0 in | Wt 235.5 lb

## 2015-04-22 DIAGNOSIS — R030 Elevated blood-pressure reading, without diagnosis of hypertension: Secondary | ICD-10-CM | POA: Diagnosis not present

## 2015-04-22 DIAGNOSIS — I10 Essential (primary) hypertension: Secondary | ICD-10-CM

## 2015-04-22 DIAGNOSIS — IMO0001 Reserved for inherently not codable concepts without codable children: Secondary | ICD-10-CM

## 2015-04-22 LAB — BASIC METABOLIC PANEL
BUN: 15 mg/dL (ref 6–23)
CO2: 29 mEq/L (ref 19–32)
Calcium: 9.8 mg/dL (ref 8.4–10.5)
Chloride: 101 mEq/L (ref 96–112)
Creatinine, Ser: 1.35 mg/dL (ref 0.40–1.50)
GFR: 55.72 mL/min — ABNORMAL LOW (ref >60.00)
Glucose, Bld: 95 mg/dL (ref 70–99)
Potassium: 4.6 mEq/L (ref 3.5–5.1)
Sodium: 138 mEq/L (ref 135–145)

## 2015-04-22 MED ORDER — AMLODIPINE BESYLATE 5 MG PO TABS: 5.0000 mg | ORAL_TABLET | Freq: Every day | ORAL | Status: DC

## 2015-04-22 NOTE — Telephone Encounter (Signed)
Pt called about his bp He checked it at 1:30  181/116 3:50  172/110  Pt will be going out of town tomorrow He wanted to know if you could give him a rx now [facility]  Please advise

## 2015-04-22 NOTE — Progress Notes (Signed)
Pre visit review using our clinic review tool, if applicable. No additional management support is needed unless otherwise documented below in the visit note.  New patient to est care.    Elevated BP.    Using medication without problems or lightheadedness: yes Chest pain with exertion:no Edema:no Short of breath:no Not on BP meds.  Leaving town tomorrow to go to [place].   Recheck BP L 158/96, R 156/94.    Weight is up since back surgery.  He plans to work on weight loss now that he was cleared to restart exercise.   Recently with a presumed L ear infection, sx resolved and soon to finish abx.    PMH and SH reviewed  ROS: See HPI, otherwise noncontributory.  Meds, vitals, and allergies reviewed.   GEN: nad, alert and oriented HEENT: mucous membranes moist NECK: supple w/o LA CV: rrr PULM: ctab, no inc wob ABD: soft, +bs EXT: no edema

## 2015-04-22 NOTE — Telephone Encounter (Signed)
Noted.  Agreed.  Thanks.   See notes on labs.

## 2015-04-22 NOTE — Patient Instructions (Signed)
Go to the lab on the way out.  We'll contact you with your lab report. Check your BP a few times out of clinic.  Update [place] with the reading when we call about the labs.   We may need to send BP medicine to the pharmacy in [place].  Please check to see which one is nearby.  Schedule a physical for the summer of 2017.   Take care.  Glad to see you.

## 2015-04-22 NOTE — Telephone Encounter (Signed)
Spoke with patient who is leaving tomorrow for [place] for 1 month. Elevated readings in the past, currently not on medication, will go ahead and send in low dose amlodipine due to elevated reading at home.    Recent BMP not back yet, although history of elevation in creatinine and lower sodium levels from readings in September 2016. Will not do HCTZ or ACE because of prior labs. Discussed to send [place] readings while in [place] once he's started amlodipine.  Discussed to call us if any dizziness or chest pain once starting medication, he verbalized understanding.  He will schedule follow up with PCP as soon as he returns.

## 2015-04-23 ENCOUNTER — Encounter: Payer: Self-pay | Admitting: Family Medicine

## 2015-04-23 DIAGNOSIS — I10 Essential (primary) hypertension: Secondary | ICD-10-CM | POA: Insufficient documentation

## 2015-04-23 NOTE — Assessment & Plan Note (Signed)
>  25 minutes spent in face to face time with patient, >50% spent in counselling or coordination of care.  See f/u phone note.   Since BP was elevated after OV, start amlodipine.   See notes on labs.  D/w pt about possible start of BP med at [place], with the plan for him to work to get BP down with diet and exercise in the meantime.  Still okay for outpatient f/u.

## 2015-05-12 ENCOUNTER — Telehealth: Payer: Self-pay | Admitting: Family Medicine

## 2015-05-12 DIAGNOSIS — I10 Essential (primary) hypertension: Secondary | ICD-10-CM

## 2015-05-12 MED ORDER — AMLODIPINE BESYLATE 5 MG PO TABS: 10.0000 mg | ORAL_TABLET | Freq: Every day | ORAL | Status: DC

## 2015-05-12 NOTE — Telephone Encounter (Signed)
If he is still taking amlodipine 5mg  a day, then up it to 10mg  a day and let me know about his BP next week.  Use the pills he has now.   If already on 10mg  a day, then let me know.   Thanks.

## 2015-05-12 NOTE — Telephone Encounter (Signed)
Noted. Thanks.

## 2015-05-12 NOTE — Telephone Encounter (Signed)
Pt called stating he is in [place] and the bp med dr Damita Dunnings gave is not helping get his bp down He wanted to know if you could call something else in. His bp is 154/116  Dropped 144/101 today Please advise  [facility] [place]

## 2015-05-12 NOTE — Telephone Encounter (Signed)
Patient called.  He said he's taking 5 mg a day.  I let him know to increase it to 10 mg a day and let Dr.Duncan know how his BP is next week.  Patient voiced understanding.

## 2015-06-14 DIAGNOSIS — M47812 Spondylosis without myelopathy or radiculopathy, cervical region: Secondary | ICD-10-CM | POA: Diagnosis not present

## 2015-06-14 DIAGNOSIS — M545 Low back pain: Secondary | ICD-10-CM | POA: Diagnosis not present

## 2015-06-15 ENCOUNTER — Ambulatory Visit (INDEPENDENT_AMBULATORY_CARE_PROVIDER_SITE_OTHER): Payer: PPO | Admitting: Family Medicine

## 2015-06-15 ENCOUNTER — Encounter: Payer: Self-pay | Admitting: Family Medicine

## 2015-06-15 VITALS — BP 142/70 | HR 80 | Temp 98.5°F | Wt 230.2 lb

## 2015-06-15 DIAGNOSIS — N62 Hypertrophy of breast: Secondary | ICD-10-CM

## 2015-06-15 NOTE — Progress Notes (Signed)
Pre visit review using our clinic review tool, if applicable. No additional management support is needed unless otherwise documented below in the visit note.  His back was injected yesterday and his pain is clearly better (Dr. Maryjean Ka' clinic).  His BP has improved in the meantime.   He clearly feels better.    Breasts are tender, at the nipple.  Predates the amlodipine start.  No other new meds.  No discharge.  No FCNAVD.  He feels well o/w.  Nipples only tender if pressing on them.  Has been on PPI, which can cause gynecomastia.  D/w pt.    Meds, vitals, and allergies reviewed.   ROS: See HPI.  Otherwise, noncontributory.  GEN: nad, alert and oriented HEENT: mucous membranes moist NECK: supple w/o LA CV: rrr.  B symmetric nipple tenderness with tissue thickening noted but no other breast changes.  PULM: ctab, no inc wob EXT: no edema

## 2015-06-15 NOTE — Patient Instructions (Signed)
This is likely gynecomastia.  We don't need to get a mammogram at this point, since you have it on both sides.  This is most likely med related (prevacid) but we need to exclude other issues first.  Come back for fasting labs and we'll go from there.  If you have are fine, we'll likely have to taper you off the prevacid.  Take care. Glad to see you.

## 2015-06-15 NOTE — Assessment & Plan Note (Signed)
We don't need to get a mammogram at this point, since it is bilateral.   This is most likely med related (prevacid) but we need to exclude other issues first.  He'll come back for fasting labs and we'll go from there.  If his are fine, we'll likely have to taper off the prevacid.  D/w pt. He agrees.

## 2015-06-21 ENCOUNTER — Other Ambulatory Visit (INDEPENDENT_AMBULATORY_CARE_PROVIDER_SITE_OTHER): Payer: PPO

## 2015-06-21 DIAGNOSIS — N62 Hypertrophy of breast: Secondary | ICD-10-CM | POA: Diagnosis not present

## 2015-06-21 DIAGNOSIS — Z79899 Other long term (current) drug therapy: Secondary | ICD-10-CM | POA: Diagnosis not present

## 2015-06-21 LAB — HEPATIC FUNCTION PANEL
ALT: 25 U/L (ref 0–53)
AST: 17 U/L (ref 0–37)
Albumin: 4.2 g/dL (ref 3.5–5.2)
Alkaline Phosphatase: 78 U/L (ref 39–117)
Bilirubin, Direct: 0.1 mg/dL (ref 0.0–0.3)
Total Bilirubin: 0.4 mg/dL (ref 0.2–1.2)
Total Protein: 7.5 g/dL (ref 6.0–8.3)

## 2015-06-21 LAB — CBC WITH DIFFERENTIAL/PLATELET
Basophils Absolute: 0 10*3/uL (ref 0.0–0.1)
Basophils Relative: 0.4 % (ref 0.0–3.0)
Eosinophils Absolute: 0.1 10*3/uL (ref 0.0–0.7)
Eosinophils Relative: 1 % (ref 0.0–5.0)
HCT: 41.5 % (ref 39.0–52.0)
Hemoglobin: 14.1 g/dL (ref 13.0–17.0)
Lymphocytes Relative: 22.3 % (ref 12.0–46.0)
Lymphs Abs: 2.1 10*3/uL (ref 0.7–4.0)
MCHC: 33.9 g/dL (ref 30.0–36.0)
MCV: 86.6 fl (ref 78.0–100.0)
Monocytes Absolute: 0.9 10*3/uL (ref 0.1–1.0)
Monocytes Relative: 9.6 % (ref 3.0–12.0)
Neutro Abs: 6.2 10*3/uL (ref 1.4–7.7)
Neutrophils Relative %: 66.7 % (ref 43.0–77.0)
Platelets: 266 10*3/uL (ref 150.0–400.0)
RBC: 4.79 Mil/uL (ref 4.22–5.81)
RDW: 13.8 % (ref 11.5–15.5)
WBC: 9.3 10*3/uL (ref 4.0–10.5)

## 2015-06-21 LAB — TESTOSTERONE: Testosterone: 196.98 ng/dL — ABNORMAL LOW (ref 300.00–890.00)

## 2015-06-21 LAB — LUTEINIZING HORMONE: LH: 2.93 m[IU]/mL

## 2015-06-21 LAB — TSH: TSH: 4.02 u[IU]/mL (ref 0.35–4.50)

## 2015-06-23 LAB — HCG, TOTAL, QUANTITATIVE: hCG, Beta Chain, Quant, S: <2 m[IU]/mL

## 2015-06-25 LAB — ESTRADIOL, FREE
Estradiol, Free: 0.35 pg/mL (ref <=0.45)
Estradiol: 15 pg/mL (ref <=29)

## 2015-07-05 ENCOUNTER — Other Ambulatory Visit: Payer: Self-pay | Admitting: Family Medicine

## 2015-07-05 DIAGNOSIS — N62 Hypertrophy of breast: Secondary | ICD-10-CM

## 2015-07-06 ENCOUNTER — Other Ambulatory Visit (INDEPENDENT_AMBULATORY_CARE_PROVIDER_SITE_OTHER): Payer: PPO

## 2015-07-06 DIAGNOSIS — N62 Hypertrophy of breast: Secondary | ICD-10-CM

## 2015-07-07 LAB — PROLACTIN: Prolactin: 22 ng/mL — ABNORMAL HIGH (ref 2.0–18.0)

## 2015-07-11 ENCOUNTER — Other Ambulatory Visit: Payer: Self-pay | Admitting: Family Medicine

## 2015-07-11 DIAGNOSIS — N62 Hypertrophy of breast: Secondary | ICD-10-CM

## 2015-07-12 DIAGNOSIS — M545 Low back pain: Secondary | ICD-10-CM | POA: Diagnosis not present

## 2015-07-12 DIAGNOSIS — M47812 Spondylosis without myelopathy or radiculopathy, cervical region: Secondary | ICD-10-CM | POA: Diagnosis not present

## 2015-07-13 ENCOUNTER — Other Ambulatory Visit (INDEPENDENT_AMBULATORY_CARE_PROVIDER_SITE_OTHER): Payer: PPO

## 2015-07-13 DIAGNOSIS — N62 Hypertrophy of breast: Secondary | ICD-10-CM

## 2015-07-14 LAB — PROLACTIN: Prolactin: 10.4 ng/mL (ref 2.0–18.0)

## 2015-07-18 ENCOUNTER — Other Ambulatory Visit: Payer: Self-pay | Admitting: Primary Care

## 2015-07-19 NOTE — Telephone Encounter (Signed)
Ok to refill? Dosage change?  Last prescribed on 04/22/2015. Last seen on 06/15/2015. No future appt.

## 2015-07-19 NOTE — Telephone Encounter (Signed)
Changed from 2x5mg  to 1x10mg  tab.  Take 10mg  a day.  Sent.  Thanks.

## 2015-07-20 ENCOUNTER — Other Ambulatory Visit: Payer: PPO

## 2015-07-20 NOTE — Telephone Encounter (Signed)
Left detailed message on voicemail.  

## 2015-08-09 DIAGNOSIS — M545 Low back pain: Secondary | ICD-10-CM | POA: Diagnosis not present

## 2015-08-09 DIAGNOSIS — M47812 Spondylosis without myelopathy or radiculopathy, cervical region: Secondary | ICD-10-CM | POA: Diagnosis not present

## 2015-08-09 DIAGNOSIS — Z6830 Body mass index (BMI) 30.0-30.9, adult: Secondary | ICD-10-CM | POA: Diagnosis not present

## 2015-08-18 DIAGNOSIS — M7042 Prepatellar bursitis, left knee: Secondary | ICD-10-CM | POA: Diagnosis not present

## 2015-08-18 DIAGNOSIS — M25562 Pain in left knee: Secondary | ICD-10-CM | POA: Diagnosis not present

## 2015-09-06 DIAGNOSIS — M4316 Spondylolisthesis, lumbar region: Secondary | ICD-10-CM | POA: Diagnosis not present

## 2015-09-06 DIAGNOSIS — M545 Low back pain: Secondary | ICD-10-CM | POA: Diagnosis not present

## 2015-09-06 DIAGNOSIS — M47812 Spondylosis without myelopathy or radiculopathy, cervical region: Secondary | ICD-10-CM | POA: Diagnosis not present

## 2015-09-10 DIAGNOSIS — M47812 Spondylosis without myelopathy or radiculopathy, cervical region: Secondary | ICD-10-CM | POA: Diagnosis not present

## 2015-09-10 DIAGNOSIS — M4322 Fusion of spine, cervical region: Secondary | ICD-10-CM | POA: Diagnosis not present

## 2015-09-14 DIAGNOSIS — M47812 Spondylosis without myelopathy or radiculopathy, cervical region: Secondary | ICD-10-CM | POA: Diagnosis not present

## 2015-11-19 ENCOUNTER — Other Ambulatory Visit: Payer: Self-pay | Admitting: Family Medicine

## 2015-11-19 DIAGNOSIS — I10 Essential (primary) hypertension: Secondary | ICD-10-CM

## 2015-11-22 DIAGNOSIS — M545 Low back pain: Secondary | ICD-10-CM | POA: Diagnosis not present

## 2015-11-22 DIAGNOSIS — M4316 Spondylolisthesis, lumbar region: Secondary | ICD-10-CM | POA: Diagnosis not present

## 2015-11-23 ENCOUNTER — Other Ambulatory Visit (INDEPENDENT_AMBULATORY_CARE_PROVIDER_SITE_OTHER): Payer: PPO

## 2015-11-23 DIAGNOSIS — I10 Essential (primary) hypertension: Secondary | ICD-10-CM | POA: Diagnosis not present

## 2015-11-23 LAB — LIPID PANEL
Cholesterol: 140 mg/dL (ref 0–200)
HDL: 32.2 mg/dL — ABNORMAL LOW (ref >39.00)
LDL Cholesterol: 88 mg/dL (ref 0–99)
NonHDL: 108.05
Total CHOL/HDL Ratio: 4
Triglycerides: 101 mg/dL (ref 0.0–149.0)
VLDL: 20.2 mg/dL (ref 0.0–40.0)

## 2015-11-23 LAB — COMPREHENSIVE METABOLIC PANEL
ALT: 19 U/L (ref 0–53)
AST: 17 U/L (ref 0–37)
Albumin: 4.1 g/dL (ref 3.5–5.2)
Alkaline Phosphatase: 59 U/L (ref 39–117)
BUN: 13 mg/dL (ref 6–23)
CO2: 29 mEq/L (ref 19–32)
Calcium: 9.5 mg/dL (ref 8.4–10.5)
Chloride: 104 mEq/L (ref 96–112)
Creatinine, Ser: 1.12 mg/dL (ref 0.40–1.50)
GFR: 69 mL/min (ref >60.00)
Glucose, Bld: 106 mg/dL — ABNORMAL HIGH (ref 70–99)
Potassium: 4.5 mEq/L (ref 3.5–5.1)
Sodium: 140 mEq/L (ref 135–145)
Total Bilirubin: 0.5 mg/dL (ref 0.2–1.2)
Total Protein: 7.2 g/dL (ref 6.0–8.3)

## 2015-11-30 ENCOUNTER — Ambulatory Visit (INDEPENDENT_AMBULATORY_CARE_PROVIDER_SITE_OTHER): Payer: PPO | Admitting: Family Medicine

## 2015-11-30 ENCOUNTER — Encounter: Payer: Self-pay | Admitting: Family Medicine

## 2015-11-30 VITALS — BP 142/74 | HR 71 | Temp 98.8°F | Ht 74.0 in | Wt 221.5 lb

## 2015-11-30 DIAGNOSIS — Z0000 Encounter for general adult medical examination without abnormal findings: Secondary | ICD-10-CM | POA: Diagnosis not present

## 2015-11-30 DIAGNOSIS — M545 Low back pain: Secondary | ICD-10-CM

## 2015-11-30 DIAGNOSIS — G4733 Obstructive sleep apnea (adult) (pediatric): Secondary | ICD-10-CM

## 2015-11-30 DIAGNOSIS — Z9989 Dependence on other enabling machines and devices: Secondary | ICD-10-CM

## 2015-11-30 DIAGNOSIS — Z7189 Other specified counseling: Secondary | ICD-10-CM

## 2015-11-30 NOTE — Progress Notes (Signed)
I have personally reviewed the Medicare Annual Wellness questionnaire and have noted 1. The patient's medical and social history 2. Their use of alcohol, tobacco or illicit drugs 3. Their current medications and supplements 4. The patient's functional ability including ADL's, fall risks, home safety risks and hearing or visual             impairment. 5. Diet and physical activities 6. Evidence for depression or mood disorders  The patients weight, height, BMI have been recorded in the chart and visual acuity is per eye clinic.  I have made referrals, counseling and provided education to the patient based review of the above and I have provided the pt with a written personalized care plan for preventive services.  Provider list updated- see scanned forms.  Routine anticipatory guidance given to patient.  See health maintenance.  He got a CPAP (self ordered) and started using it.  Sleeping much better, no snoring.  Clearly feels better in the meantime.  7-10 autoregulating pressure.   He didn't have formal testing.  D/w pt.  At this point, with clinical response, it likely isn't reasonable to proceed with testing.   Flu encouraged.  Shingles encouraged PNA encouraged Tetanus encouraged Colonosccopy done 02/2011 and negative per patient report.  Prostate cancer screening- declined for now, d/w pt.  Prostate cancer screening and PSA options (with potential risks and benefits of testing vs not testing) were discussed along with recent recs/guidelines.  He declined testing PSA at this point. Advance directive- wife designated if patient were incapacitated.   Cognitive function addressed- see scanned forms- and if abnormal then additional documentation follows.    Gynecomastia resolved in the meantime, w/o pain or sx now.    Off BP meds since back pain is much improved.  Rare use of oxycodone, taking advil prn.  He is clearly improved.    PMH and SH reviewed  Meds, vitals, and allergies  reviewed.   ROS: Per HPI.  Unless specifically indicated otherwise in HPI, the patient denies:  General: fever. Eyes: acute vision changes ENT: sore throat Cardiovascular: chest pain Respiratory: SOB GI: vomiting GU: dysuria Musculoskeletal: acute back pain Derm: acute rash Neuro: acute motor dysfunction Psych: worsening mood Endocrine: polydipsia Heme: bleeding Allergy: hayfever  GEN: nad, alert and oriented HEENT: mucous membranes moist NECK: supple w/o LA CV: rrr. PULM: ctab, no inc wob ABD: soft, +bs EXT: no edema SKIN: no acute rash

## 2015-11-30 NOTE — Patient Instructions (Addendum)
Check with your insurance to see if they will cover the shingles and tetanus shots. I would get a flu shot each fall.   Think about getting the pneumonia shots (one dose of each, over a two year period).  Take care.  Glad to see you.  Update me as needed.

## 2015-11-30 NOTE — Progress Notes (Signed)
Pre visit review using our clinic review tool, if applicable. No additional management support is needed unless otherwise documented below in the visit note. 

## 2015-12-01 ENCOUNTER — Encounter: Payer: Self-pay | Admitting: Family Medicine

## 2015-12-01 DIAGNOSIS — M545 Low back pain, unspecified: Secondary | ICD-10-CM | POA: Insufficient documentation

## 2015-12-01 DIAGNOSIS — Z9989 Dependence on other enabling machines and devices: Secondary | ICD-10-CM

## 2015-12-01 DIAGNOSIS — Z7189 Other specified counseling: Secondary | ICD-10-CM | POA: Insufficient documentation

## 2015-12-01 DIAGNOSIS — G4733 Obstructive sleep apnea (adult) (pediatric): Secondary | ICD-10-CM | POA: Insufficient documentation

## 2015-12-01 DIAGNOSIS — Z0000 Encounter for general adult medical examination without abnormal findings: Secondary | ICD-10-CM | POA: Insufficient documentation

## 2015-12-01 NOTE — Assessment & Plan Note (Addendum)
Flu encouraged.  Shingles encouraged PNA encouraged Tetanus encouraged Colonosccopy done 02/2011 and negative per patient report.  Prostate cancer screening- declined for now, d/w pt.  Prostate cancer screening and PSA options (with potential risks and benefits of testing vs not testing) were discussed along with recent recs/guidelines.  He declined testing PSA at this point. PSA prev wnl, d/w pt.  Advance directive- wife designated if patient were incapacitated.   Cognitive function addressed- see scanned forms- and if abnormal then additional documentation follows.   dw pt about diet and exercise re: mild inc in sugars.  Labs d/w pt.  [facility] per patient report.

## 2015-12-01 NOTE — Assessment & Plan Note (Signed)
He got a CPAP (self ordered) and started using it.  Sleeping much better, no snoring.  Clearly feels better in the meantime.  7-10 autoregulating pressure.   He didn't have formal testing.  D/w pt.  At this point, with clinical response, it likely isn't reasonable to proceed with testing.

## 2015-12-01 NOTE — Assessment & Plan Note (Signed)
Clearly improved after surgery, with minimal opiate use and BP improved as pain controlled.  Continue as is.  He agrees.

## 2015-12-01 NOTE — Assessment & Plan Note (Signed)
Advance directive- wife designated if patient were incapacitated.  

## 2015-12-07 DIAGNOSIS — M4712 Other spondylosis with myelopathy, cervical region: Secondary | ICD-10-CM | POA: Diagnosis not present

## 2015-12-07 DIAGNOSIS — M542 Cervicalgia: Secondary | ICD-10-CM | POA: Diagnosis not present

## 2015-12-07 DIAGNOSIS — M545 Low back pain: Secondary | ICD-10-CM | POA: Diagnosis not present

## 2015-12-07 DIAGNOSIS — M47812 Spondylosis without myelopathy or radiculopathy, cervical region: Secondary | ICD-10-CM | POA: Diagnosis not present

## 2015-12-24 ENCOUNTER — Encounter: Payer: Self-pay | Admitting: Family Medicine

## 2015-12-24 ENCOUNTER — Ambulatory Visit (INDEPENDENT_AMBULATORY_CARE_PROVIDER_SITE_OTHER): Payer: PPO | Admitting: Family Medicine

## 2015-12-24 VITALS — BP 140/80 | HR 87 | Wt 223.0 lb

## 2015-12-24 DIAGNOSIS — M79672 Pain in left foot: Secondary | ICD-10-CM | POA: Diagnosis not present

## 2015-12-24 DIAGNOSIS — M799 Soft tissue disorder, unspecified: Secondary | ICD-10-CM

## 2015-12-24 DIAGNOSIS — M7989 Other specified soft tissue disorders: Secondary | ICD-10-CM

## 2015-12-24 NOTE — Progress Notes (Signed)
   Subjective:    Patient ID: [name], male    DOB: January 17, 1946, 70 y.o.   MRN: [id number]  HPI This is a 70 yo male who is accompanied by his wife. He has noticed left foot pain off and on for several weeks. Feels like he got something under the skin, but doesn't recall stepping on anything. Doesn't walk around barefoot. Pain is dependent on which shoes he wears. No nighttime pain, no drainage.   Past Medical History:  Diagnosis Date  . Arthritis   . GERD (gastroesophageal reflux disease)   . H/O urinary frequency   . Heart murmur    as a child  . History of bronchitis   . Hypertension    improved after back pain treated with surgery  . Insomnia   . Joint pain   . OSA on CPAP    he ordered kit and started use 2017 w/o formal testing, see note from 11/30/15.   . Vertigo 10/13/2014   recurrent; trigger is cool air on left ear, improved with daily antihistamine   Past Surgical History:  Procedure Laterality Date  . ACHILLES TENDON REPAIR Left   . COLONOSCOPY  02/2011   Negative,[facility]  . EYE SURGERY Bilateral    cataracts  . KNEE ARTHROSCOPY     L  . LUMBAR FUSION     2016  . SPINE SURGERY  2003   cervical fusion  . TONSILLECTOMY    . varicoelectomy     Family History  Problem Relation Age of Onset  . Hypertension Mother   . Asthma Mother   . Diabetes Father   . Stroke Father   . Heart attack Paternal Grandfather     >55  . Cancer Neg Hx   . Colon cancer Neg Hx   . Prostate cancer Neg Hx    Social History  Substance Use Topics  . Smoking status: Never Smoker  . Smokeless tobacco: Never Used  . Alcohol use No      Review of Systems Per HPI    Objective:   Physical Exam  Constitutional: He is oriented to person, place, and time. He appears well-developed and well-nourished.  Eyes: Conjunctivae are normal.  Cardiovascular: Normal rate.   Pulmonary/Chest: Effort normal.  Musculoskeletal:       Feet:  Neurological: He is alert and  oriented to person, place, and time.  Skin: Skin is warm and dry.  Vitals reviewed.    BP 140/80   Pulse 87   Wt 223 lb (101.2 kg)   SpO2 93%   BMI 28.63 kg/m  Wt Readings from Last 3 Encounters:  12/24/15 223 lb (101.2 kg)  11/30/15 221 lb 8 oz (100.5 kg)  06/15/15 230 lb 4 oz (104.4 kg)        Assessment & Plan:  1. Left foot pain - Ambulatory referral to Podiatry  2. Soft tissue lesion of foot - not sure if superficial foreign body or corn, due to extreme tenderness, will refer to podiatry - Ambulatory referral to [place], FNP-BC  [facility], [place]  12/24/2015 2:31 PM

## 2016-01-17 DIAGNOSIS — M47812 Spondylosis without myelopathy or radiculopathy, cervical region: Secondary | ICD-10-CM | POA: Diagnosis not present

## 2016-01-19 ENCOUNTER — Ambulatory Visit: Payer: PPO | Admitting: Podiatry

## 2016-02-04 DIAGNOSIS — E7400 Glycogen storage disease, unspecified: Secondary | ICD-10-CM | POA: Diagnosis not present

## 2016-02-15 ENCOUNTER — Ambulatory Visit (INDEPENDENT_AMBULATORY_CARE_PROVIDER_SITE_OTHER): Payer: PPO | Admitting: Family Medicine

## 2016-02-15 ENCOUNTER — Encounter: Payer: Self-pay | Admitting: Family Medicine

## 2016-02-15 VITALS — BP 122/72 | HR 72 | Temp 98.7°F | Wt 223.2 lb

## 2016-02-15 DIAGNOSIS — K1370 Unspecified lesions of oral mucosa: Secondary | ICD-10-CM | POA: Diagnosis not present

## 2016-02-15 MED ORDER — VALACYCLOVIR HCL 1 G PO TABS: 2000.0000 mg | ORAL_TABLET | Freq: Two times a day (BID) | ORAL | 0 refills | Status: DC

## 2016-02-15 NOTE — Patient Instructions (Signed)
We'll contact you with your lab report. Add on valtrex in the meantime.  Take care.  Glad to see you.  Update me if not better.

## 2016-02-15 NOTE — Progress Notes (Signed)
Pre visit review using our clinic review tool, if applicable. No additional management support is needed unless otherwise documented below in the visit note. 

## 2016-02-15 NOTE — Progress Notes (Signed)
Off pain medicine in the meantime.  No new med to cause his recently sx.  Started abreva after the fact.    Initially with a lesion on the R upper lip, noted yesterday.  No trauma, no FCNAV.  Some diarrhea recently, started yesterday AM, no more in the meantime.  Mild ST.   Lesions on the inner upper lip, whitish bumps bilaterally, not tender.    H/o cold sores years ago.  He seems to be a little better with abreva.    Meds, vitals, and allergies reviewed.   ROS: Per HPI unless specifically indicated in ROS section   nad ncat OP wnl except for upper lip white spots noted B.   R lateral lip lesion with local irritation noted, but not diffuse lip edema.  Lower lip wnl.   Tongue and mucose wnl o/w.   Neck supple, no LA rrr abd soft, no ttp, normal BS

## 2016-02-16 DIAGNOSIS — K1370 Unspecified lesions of oral mucosa: Secondary | ICD-10-CM | POA: Insufficient documentation

## 2016-02-16 NOTE — Assessment & Plan Note (Signed)
It could be that he has an early cold sore on the right lateral side of the upper lip. HSV viral culture collected today. White spots on the upper lip are small, each about a millimeter across. They are nonfluctuant. Look atypical for yeast. Unclear if this is related to everything else he has noted. He also had some recent diarrhea. Unclear if he had some other primary illness, such as limited viral gastroenteritis, that was antecedent to the eruption possible cold sore. Clearly nontoxic. Okay for outpatient follow-up. Reasonable to treat for a cold sore the meantime given his previous history. Start valacyclovir. Update me as needed. See notes on labs. He agrees.

## 2016-02-17 DIAGNOSIS — M4316 Spondylolisthesis, lumbar region: Secondary | ICD-10-CM | POA: Diagnosis not present

## 2016-02-17 DIAGNOSIS — M47812 Spondylosis without myelopathy or radiculopathy, cervical region: Secondary | ICD-10-CM | POA: Diagnosis not present

## 2016-02-17 DIAGNOSIS — M545 Low back pain: Secondary | ICD-10-CM | POA: Diagnosis not present

## 2016-02-24 LAB — REFLEX ADENOVIRUS CULTURE

## 2016-02-24 LAB — RFX HSV/VARICELLA ZOSTER RAPID CULT

## 2016-02-25 ENCOUNTER — Encounter: Payer: Self-pay | Admitting: Family Medicine

## 2016-02-25 ENCOUNTER — Telehealth: Payer: Self-pay | Admitting: Family Medicine

## 2016-02-25 LAB — VIRAL CULTURE VIRC

## 2016-02-25 LAB — CYTOMEGALOVIRUS CULTURE

## 2016-02-25 NOTE — Telephone Encounter (Signed)
Addressed through result notes  

## 2016-02-25 NOTE — Telephone Encounter (Signed)
Patient returned Shapale's call. °

## 2016-03-21 DIAGNOSIS — R030 Elevated blood-pressure reading, without diagnosis of hypertension: Secondary | ICD-10-CM | POA: Diagnosis not present

## 2016-03-21 DIAGNOSIS — M545 Low back pain: Secondary | ICD-10-CM | POA: Diagnosis not present

## 2016-03-21 DIAGNOSIS — M48061 Spinal stenosis, lumbar region without neurogenic claudication: Secondary | ICD-10-CM | POA: Diagnosis not present

## 2016-03-28 DIAGNOSIS — L0202 Furuncle of face: Secondary | ICD-10-CM | POA: Diagnosis not present

## 2016-03-28 DIAGNOSIS — B9689 Other specified bacterial agents as the cause of diseases classified elsewhere: Secondary | ICD-10-CM | POA: Diagnosis not present

## 2016-03-28 DIAGNOSIS — L570 Actinic keratosis: Secondary | ICD-10-CM | POA: Diagnosis not present

## 2016-03-28 DIAGNOSIS — B078 Other viral warts: Secondary | ICD-10-CM | POA: Diagnosis not present

## 2016-03-28 DIAGNOSIS — X32XXXD Exposure to sunlight, subsequent encounter: Secondary | ICD-10-CM | POA: Diagnosis not present

## 2016-04-11 ENCOUNTER — Telehealth: Payer: Self-pay

## 2016-04-11 NOTE — Telephone Encounter (Signed)
PLEASE NOTE: All timestamps contained within this report are represented as [place] Standard Time. CONFIDENTIALTY NOTICE: This fax transmission is intended only for the addressee. It contains information that is legally privileged, confidential or otherwise protected from use or disclosure. If you are not the intended recipient, you are strictly prohibited from reviewing, disclosing, copying using or disseminating any of this information or taking any action in reliance on or regarding this information. If you have received this fax in error, please notify us immediately by telephone so that we can arrange for its return to [place]. Phone: [phone], Toll-Free: [phone], Fax: [phone] Page: 1 of 2 Call Id: [id number] [place] Patient Name: [name] Gender: Male DOB: Jan 06, 1946 Age: 70 Y 9 M 26 D Return Phone Number: [phone] (Primary) Address: City/State/Zip: [place] Client [facility] - Client Client Site [facility] - Day Physician Renford Dills - MD Contact Type Call Who Is Calling Patient / Member / Family / Caregiver Call Type Triage / Clinical Relationship To Patient Self Return Phone Number [phone] (Primary) Chief Complaint Urination Frequency Reason for Call Symptomatic / Request for [place] states he has been urinating frequently. Every 15 minutes or so. Appointment Disposition EMR Appointment Attempted - Not Scheduled Info pasted into Epic No PreDisposition Did not know what to do Translation No Nurse Assessment Nurse: Hammonds, RN, Epifanio Lesches Date/Time (Eastern Time): 04/07/2016 5:37:40 PM Confirm and document reason for call. If symptomatic, describe symptoms. ---Caller states he has been urinating frequently. Every 15 minutes or so. If I drink tea or coke I continue to go and go  and go. Would like to get some medication. This has been occuring for years. No back pain no pain urinating. I think overactive bladder. Normal color. Does the patient have any new or worsening symptoms? ---Yes Will a triage be completed? ---Yes Related visit to physician within the last 2 weeks? ---No Does the PT have any chronic conditions? (i.e. diabetes, asthma, etc.) ---No Is this a behavioral health or substance abuse call? ---No Guidelines Guideline Title Affirmed Question Affirmed Notes Nurse Date/Time (Eastern Time) Urinary Symptoms Has to get out of bed to urinate > 2 times a night (i.e., nocturia) Hammonds, RN, Lissa 04/07/2016 5:41:31 PM Disp. Time Eilene Ghazi Time) Disposition Final User 04/07/2016 5:11:20 PM Attempt made - message left Hammonds, RN, Lissa 04/07/2016 5:46:35 PM See PCP within 2 Weeks Yes Hammonds, RN, Lissa PLEASE NOTE: All timestamps contained within this report are represented as [place] Standard Time. CONFIDENTIALTY NOTICE: This fax transmission is intended only for the addressee. It contains information that is legally privileged, confidential or otherwise protected from use or disclosure. If you are not the intended recipient, you are strictly prohibited from reviewing, disclosing, copying using or disseminating any of this information or taking any action in reliance on or regarding this information. If you have received this fax in error, please notify us immediately by telephone so that we can arrange for its return to [place]. Phone: [phone], Toll-Free: [phone], Fax: [phone] Page: 2 of 2 Call Id: [id number] Caller Understands: Yes Disagree/Comply: Comply Care Advice Given Per Guideline SEE PCP WITHIN 2 WEEKS: You need an evaluation for this ongoing problem within the next 2 weeks. Call your doctor during regular office hours and make an appointment. CALL BACK IF: * Fever occurs CARE ADVICE given per Urinary Symptoms (Adult) guideline. *  Unable to  urinate and bladder feels full * You become worse. Comments User: Moses Manners, RN Date/Time Eilene Ghazi Time): 04/07/2016 5:49:31 PM Caller wants first available appointment to be evaluated or prescription for "over active bladder". Referrals REFERRED TO PCP OFFICE REFERRED TO PCP OFFICE

## 2016-04-11 NOTE — Telephone Encounter (Signed)
If worse in the meantime, then please try to work him in sooner on my/our schedule.  Thanks.

## 2016-04-11 NOTE — Telephone Encounter (Signed)
Pt has appt with Dr Damita Dunnings 04/19/16 at 3 pm. Offered pt sooner appt with another provider but pt declined.

## 2016-04-14 ENCOUNTER — Encounter: Payer: Self-pay | Admitting: Family Medicine

## 2016-04-14 ENCOUNTER — Ambulatory Visit (INDEPENDENT_AMBULATORY_CARE_PROVIDER_SITE_OTHER): Payer: PPO | Admitting: Family Medicine

## 2016-04-14 VITALS — BP 122/80 | HR 93 | Temp 98.5°F | Wt 229.8 lb

## 2016-04-14 DIAGNOSIS — R399 Unspecified symptoms and signs involving the genitourinary system: Secondary | ICD-10-CM

## 2016-04-14 DIAGNOSIS — R350 Frequency of micturition: Secondary | ICD-10-CM

## 2016-04-14 LAB — POC URINALSYSI DIPSTICK (AUTOMATED)
Bilirubin, UA: NEGATIVE
Blood, UA: NEGATIVE
Glucose, UA: NEGATIVE
Ketones, UA: NEGATIVE
Leukocytes, UA: NEGATIVE
Nitrite, UA: NEGATIVE
Protein, UA: NEGATIVE
Spec Grav, UA: >=1.03
Urobilinogen, UA: 0.2
pH, UA: 6

## 2016-04-14 LAB — PSA: PSA: 0.53 ng/mL (ref 0.10–4.00)

## 2016-04-14 NOTE — Progress Notes (Signed)
Overactive bladder sx.  Urinary frequency and urgency.  No burning with urination.  Sx had been longstanding.  Had talked to other docs in the past.  Caffeine makes it worse. Stream is still fast.  Does have nocturia. flomax didn't help much prev.   PSA prev normal.    He needs assist device to get his socks on from prev back surgery and dec mobility.  rx done at [facility].   Meds, vitals, and allergies reviewed.   ROS: Per HPI unless specifically indicated in ROS section   nad ncat rrr ctab abd soft, not ttp Prostate gland firm and smooth, mild to moderate symmetric enlargement, but no nodularity, tenderness, mass, asymmetry or induration.

## 2016-04-14 NOTE — Patient Instructions (Signed)
Urine and blood sample today.  Go to the lab on the way out.  We'll contact you with your lab report. We'll make plans (possible flomax vs bladder med) when I see your labs.  Take care.  Glad to see you.

## 2016-04-14 NOTE — Progress Notes (Signed)
Pre visit review using our clinic review tool, if applicable. No additional management support is needed unless otherwise documented below in the visit note. 

## 2016-04-17 DIAGNOSIS — R399 Unspecified symptoms and signs involving the genitourinary system: Secondary | ICD-10-CM | POA: Insufficient documentation

## 2016-04-17 MED ORDER — OXYBUTYNIN CHLORIDE 5 MG PO TABS: 5.0000 mg | ORAL_TABLET | Freq: Two times a day (BID) | ORAL | 1 refills | Status: DC | PRN

## 2016-04-17 NOTE — Assessment & Plan Note (Signed)
Mild-to-moderate symmetric  Bilateral prostate enlargement.  flomax didn't help much prev.  See notes on labs.

## 2016-04-19 ENCOUNTER — Ambulatory Visit: Payer: PPO | Admitting: Family Medicine

## 2016-06-27 DIAGNOSIS — M47812 Spondylosis without myelopathy or radiculopathy, cervical region: Secondary | ICD-10-CM | POA: Diagnosis not present

## 2016-07-04 DIAGNOSIS — R030 Elevated blood-pressure reading, without diagnosis of hypertension: Secondary | ICD-10-CM | POA: Diagnosis not present

## 2016-07-04 DIAGNOSIS — M545 Low back pain: Secondary | ICD-10-CM | POA: Diagnosis not present

## 2016-07-04 DIAGNOSIS — Z6830 Body mass index (BMI) 30.0-30.9, adult: Secondary | ICD-10-CM | POA: Diagnosis not present

## 2016-08-08 ENCOUNTER — Telehealth: Payer: Self-pay | Admitting: Family Medicine

## 2016-08-08 NOTE — Telephone Encounter (Signed)
Patient Name: QUANELL LOUGHNEY  Gender: Male  DOB: 07/07/1945   Age: 71 Y 1 M 29 D  Return Phone Number: [phone] (Primary)  Address:   City/State/Zip: [place]    Client [facility] - Client  Client Site [facility]  Physician Renford Dills - MD  Contact Type Call  Who Is Calling Patient / Member / Family / Caregiver  Call Type Triage / Clinical  Relationship To Patient Self  Return Phone Number [phone] (Primary)  Chief Complaint Blood Pressure High  Reason for Call Symptomatic / Request for [place] has BP 167/104 and wants BP meds. No symptoms.   Appointment Disposition EMR Caller Not Reached  Info pasted into Epic Yes  Translation No   Nurse Assessment      Guidelines      Guideline Title Affirmed Question Affirmed Notes Nurse Date/Time (Eastern Time)         Disp. Time Eilene Ghazi Time) Disposition Final User   08/08/2016 4:33:38 PM Attempt made - message left  Carmon, RN, Langley Gauss   08/08/2016 6:09:20 PM Attempt made - message left  Carmon, RN, Langley Gauss    08/08/2016 6:09:45 PM FINAL ATTEMPT MADE - message left Yes Carmon, RN, Langley Gauss

## 2016-08-09 ENCOUNTER — Encounter: Payer: Self-pay | Admitting: Family Medicine

## 2016-08-09 ENCOUNTER — Ambulatory Visit (INDEPENDENT_AMBULATORY_CARE_PROVIDER_SITE_OTHER): Payer: PPO | Admitting: Family Medicine

## 2016-08-09 DIAGNOSIS — I10 Essential (primary) hypertension: Secondary | ICD-10-CM | POA: Diagnosis not present

## 2016-08-09 MED ORDER — AMLODIPINE BESYLATE 5 MG PO TABS: 5.0000 mg | ORAL_TABLET | Freq: Every day | ORAL | 3 refills | Status: DC

## 2016-08-09 NOTE — Progress Notes (Signed)
Pre visit review using our clinic review tool, if applicable. No additional management support is needed unless otherwise documented below in the visit note. 

## 2016-08-09 NOTE — Telephone Encounter (Signed)
I called pt and yesterday he felt flush in face(no other symptoms) and was in [facility] and took BP; BP 167/104. Pt went home and found some amlodipine that he had not taken in 8 or more months and he took one amlodipine and the flush face went away. Pt has not taken BP since. Pt scheduled appt with Dr Damita Dunnings 08/09/16 at 3 PM. FYI to Dr Damita Dunnings.

## 2016-08-09 NOTE — Progress Notes (Signed)
He felt flushed and he didn't feel well.  His BP was up.  He is back from [place] after 2 months there (he had a great time), he isn't cycling as much as prev as he did in [place].  He recently had troubles at the bank with an account and he had to get his taxes in.  His mortgage interest deduction is going to change.  "I think it hit me all at one time."  No exertional CP, SOB, BLE edema.  BP improved today.  He took a dose of amlodipine; he had prev been able to get off that medicine when BP was prev lower.  He was prev able to ride his bike 25 miles w/o troubles, at a quick pace.    Ditropan prev worked, used as needed now.    Using oxycodone occ for his back.  He limits his use.  He was able to walk today at the mall w/o troubles.  He is scheduled to get his back injected next week.    He had an incidental rash on the L forearm that improved with OTC itch cream.    Meds, vitals, and allergies reviewed.   ROS: Per HPI unless specifically indicated in ROS section   GEN: nad, alert and oriented HEENT: mucous membranes moist NECK: supple w/o LA CV: rrr. PULM: ctab, no inc wob ABD: soft, +bs EXT: no edema SKIN: no acute rash- his L forearm is clearly improved- I wouldn't have noted it w/o him pointing it out.

## 2016-08-09 NOTE — Telephone Encounter (Signed)
Noted. Thanks.

## 2016-08-09 NOTE — Patient Instructions (Signed)
Take amlodipine, 5mg  a day, if BP is >140/>90.  If your BP is lower, then you can cut back to 2.5mg  a day and possibly stop the medicine.  Take care.  Glad to see you.  Update me as needed.

## 2016-08-11 NOTE — Assessment & Plan Note (Signed)
Likely related to recent stressors. No chest pain or shortness of breath. Has been able to exert without difficulties. Restart amlodipine. See after visit summary. Update me as needed. He agrees.

## 2016-08-17 DIAGNOSIS — M5416 Radiculopathy, lumbar region: Secondary | ICD-10-CM | POA: Diagnosis not present

## 2016-08-17 DIAGNOSIS — M545 Low back pain: Secondary | ICD-10-CM | POA: Diagnosis not present

## 2016-09-07 ENCOUNTER — Other Ambulatory Visit: Payer: Self-pay

## 2016-09-07 NOTE — Patient Outreach (Signed)
[facility]) Care Management  09/07/2016  [name] 1945-08-06 [id number]   Telephone Screen  Referral Date: 09/05/16 Referral Source: EMMI Prevent Referral Reason: "HTN" Insurance: HTA   Outreach attempt # 1 to patient. Spoke with patient who was currently driving but requested that RN CM continue with call. Patient states that he doing well and has been blessed with good health. He is able to manage his health care needs. He voices that he has very supportive spouse to assist as well. Patient currently only dealing with HTN as chronic condition and voices being knowledgeable and able to self manage condition. He reports that everything right now is going well for him as far as his health is concerned. He denies any RN CM needs or concerns. Patient states he does not feel like he needs [facility] services at this time but is glad to know services are available if needed in the future. He agreed to RN CM mailing out [place] for patient to reference as needed. No further RN CM needs or concerns at this time.     Plan: RN CM will notify [facility] administrative assistant of case status. RN CM will send [facility] letter, brochure and magnet to patient.    Enzo Montgomery, RN,BSN,CCM [facility] [facility] Telephonic Care Management Coordinator Direct Phone: [phone] Toll Free: [phone] Fax: [phone]

## 2016-11-23 DIAGNOSIS — M5416 Radiculopathy, lumbar region: Secondary | ICD-10-CM | POA: Diagnosis not present

## 2017-01-01 ENCOUNTER — Encounter: Payer: Self-pay | Admitting: Family Medicine

## 2017-01-01 ENCOUNTER — Ambulatory Visit (INDEPENDENT_AMBULATORY_CARE_PROVIDER_SITE_OTHER): Payer: PPO | Admitting: Family Medicine

## 2017-01-01 DIAGNOSIS — M545 Low back pain: Secondary | ICD-10-CM

## 2017-01-01 MED ORDER — CYCLOBENZAPRINE HCL 10 MG PO TABS: 5.0000 mg | ORAL_TABLET | Freq: Three times a day (TID) | ORAL | 0 refills | Status: DC | PRN

## 2017-01-01 NOTE — Progress Notes (Signed)
Knot and pain on the R side of the back.  Pain started about 3 weeks ago, gradually worse in the meantime.  Noted a knot today, he and his wife could feel it, it felt hard and painful.  The knot was at the prev tender place.  No bruising.  No blood in urine.  No fevers, no chills. No trauma but he had been doing a lot of shoveling recently.  Some L lower back pain.  No B/B sx.  No numbness in the legs.  Able to walk/bear weight.    He prev had some vertigo but that resolved.  He has it episodically.    Meds, vitals, and allergies reviewed.   ROS: Per HPI unless specifically indicated in ROS section   GEN: nad, alert and oriented HEENT: mucous membranes moist NECK: supple w/o LA CV: rrr. PULM: ctab, no inc wob ABD: soft, +bs EXT: no edema SKIN: no acute rash Back not ttp midline, no cva pain.  Knot present on R flank w/o bruising, locally ttp, no rash.

## 2017-01-01 NOTE — Patient Instructions (Addendum)
Try heat and ice, gently stretch, and use the muscle relaxer if needed.  Sedation caution.  Update [place] as needed.  If your BP stays up then restart the amlodipine and update [place].  Take care.  Glad to see you.

## 2017-01-02 NOTE — Assessment & Plan Note (Signed)
With muscle spasm likely causing the knot that he can palpate. Had been shoveling recently. Likely a strain. Try heat and ice, gently stretch, and use the muscle relaxer if needed.  Sedation caution.  Update [place] as needed.  If BP stays up then he'll restart the amlodipine and update [place].

## 2017-01-04 ENCOUNTER — Telehealth: Payer: Self-pay | Admitting: *Deleted

## 2017-01-04 NOTE — Telephone Encounter (Signed)
PA submitted through [facility] for Cyclobenzaprine on 01/02/17.  Approval received on 01/04/17.  Patient and pharmacy advised.

## 2017-01-09 DIAGNOSIS — R030 Elevated blood-pressure reading, without diagnosis of hypertension: Secondary | ICD-10-CM | POA: Diagnosis not present

## 2017-01-09 DIAGNOSIS — M545 Low back pain: Secondary | ICD-10-CM | POA: Diagnosis not present

## 2017-01-09 DIAGNOSIS — Z6827 Body mass index (BMI) 27.0-27.9, adult: Secondary | ICD-10-CM | POA: Diagnosis not present

## 2017-03-29 DIAGNOSIS — M5416 Radiculopathy, lumbar region: Secondary | ICD-10-CM | POA: Diagnosis not present

## 2017-04-11 ENCOUNTER — Ambulatory Visit (INDEPENDENT_AMBULATORY_CARE_PROVIDER_SITE_OTHER): Payer: PPO

## 2017-04-11 VITALS — BP 134/88 | HR 87 | Temp 98.6°F | Ht 72.5 in | Wt 223.5 lb

## 2017-04-11 DIAGNOSIS — Z125 Encounter for screening for malignant neoplasm of prostate: Secondary | ICD-10-CM | POA: Diagnosis not present

## 2017-04-11 DIAGNOSIS — I10 Essential (primary) hypertension: Secondary | ICD-10-CM | POA: Diagnosis not present

## 2017-04-11 DIAGNOSIS — Z0000 Encounter for general adult medical examination without abnormal findings: Secondary | ICD-10-CM

## 2017-04-11 DIAGNOSIS — R7309 Other abnormal glucose: Secondary | ICD-10-CM | POA: Diagnosis not present

## 2017-04-11 DIAGNOSIS — E786 Lipoprotein deficiency: Secondary | ICD-10-CM

## 2017-04-11 LAB — COMPREHENSIVE METABOLIC PANEL
ALT: 24 U/L (ref 0–53)
AST: 18 U/L (ref 0–37)
Albumin: 4.6 g/dL (ref 3.5–5.2)
Alkaline Phosphatase: 66 U/L (ref 39–117)
BUN: 18 mg/dL (ref 6–23)
CO2: 27 mEq/L (ref 19–32)
Calcium: 9.1 mg/dL (ref 8.4–10.5)
Chloride: 101 mEq/L (ref 96–112)
Creatinine, Ser: 1.39 mg/dL (ref 0.40–1.50)
GFR: 53.56 mL/min — ABNORMAL LOW (ref >60.00)
Glucose, Bld: 92 mg/dL (ref 70–99)
Potassium: 4.5 mEq/L (ref 3.5–5.1)
Sodium: 137 mEq/L (ref 135–145)
Total Bilirubin: 0.9 mg/dL (ref 0.2–1.2)
Total Protein: 7.4 g/dL (ref 6.0–8.3)

## 2017-04-11 LAB — PSA: PSA: 0.46 ng/mL (ref 0.10–4.00)

## 2017-04-11 LAB — LIPID PANEL
Cholesterol: 132 mg/dL (ref 0–200)
HDL: 30.4 mg/dL — ABNORMAL LOW (ref >39.00)
LDL Cholesterol: 79 mg/dL (ref 0–99)
NonHDL: 101.32
Total CHOL/HDL Ratio: 4
Triglycerides: 111 mg/dL (ref 0.0–149.0)
VLDL: 22.2 mg/dL (ref 0.0–40.0)

## 2017-04-11 NOTE — Progress Notes (Signed)
Subjective:   [name] is a 71 y.o. male who presents for Medicare Annual/Subsequent preventive examination.  Review of Systems:  N/A Cardiac Risk Factors include: advanced age (>30mn, >>60women);male gender;hypertension     Objective:    Vitals: BP 134/88 (BP Location: Right Arm, Patient Position: Sitting, Cuff Size: Normal)   Pulse 87   Temp 98.6 F (37 C) (Oral)   Ht 6' 0.5" (1.842 m) Comment: no shoes  Wt 223 lb 8 oz (101.4 kg)   SpO2 96%   BMI 29.90 kg/m   Body mass index is 29.9 kg/m.  Advanced Directives 04/11/2017 12/07/2014 10/13/2014  Does Patient Have a Medical Advance Directive? Yes No Yes  Type of [place] will - -  Does patient want to make changes to medical advance directive? No - Patient declined - -  Copy of [place] Chart? Yes - Yes  Would patient like information on creating a medical advance directive? - No - patient declined information -    Tobacco Social History   Tobacco Use  Smoking Status Never Smoker  Smokeless Tobacco Never Used     Counseling given: No   Clinical Intake:  Pre-visit preparation completed: Yes  Pain : No/denies pain Pain Score: 0-No pain     Nutritional Status: BMI 25 -29 Overweight Nutritional Risks: None Diabetes: No  How often do you need to have someone help you when you read instructions, pamphlets, or other written materials from your doctor or pharmacy?: 1 - Never What is the last grade level you completed in school?: Masters degree  Interpreter Needed?: No  Comments: pt lives with spouse Information entered by :: LPinson, LPN  Past Medical History:  Diagnosis Date  . Arthritis   . GERD (gastroesophageal reflux disease)   . H/O urinary frequency   . Heart murmur    as a child  . History of bronchitis   . HSV infection    oral  . Hypertension    improved after back pain treated with surgery  . Insomnia   . Joint  pain   . OSA on CPAP    he ordered kit and started use 2017 w/o formal testing, see note from 11/30/15.   . Vertigo 10/13/2014   recurrent; trigger is cool air on left ear, improved with daily antihistamine   Past Surgical History:  Procedure Laterality Date  . ACHILLES TENDON REPAIR Left   . COLONOSCOPY  02/2011   Negative,[facility]  . EYE SURGERY Bilateral    cataracts  . KNEE ARTHROSCOPY     L  . LUMBAR FUSION     2016  . SPINE SURGERY  2003   cervical fusion  . TONSILLECTOMY    . varicoelectomy     Family History  Problem Relation Age of Onset  . Hypertension Mother   . Asthma Mother   . Diabetes Father   . Stroke Father   . Heart attack Paternal Grandfather        >55  . Cancer Neg Hx   . Colon cancer Neg Hx   . Prostate cancer Neg Hx    Social History   Socioeconomic History  . Marital status: Married    Spouse name: None  . Number of children: None  . Years of education: None  . Highest education level: None  Social Needs  . Financial resource strain: None  . Food insecurity - worry: None  . Food insecurity -  inability: None  . Transportation needs - medical: None  . Transportation needs - non-medical: None  Occupational History  . None  Tobacco Use  . Smoking status: Never Smoker  . Smokeless tobacco: Never Used  Substance and Sexual Activity  . Alcohol use: No  . Drug use: No  . Sexual activity: None  Other Topics Concern  . None  Social History Narrative   IT consultant (Masters)   Has Education administrator, Field seismologist [employer]   Married 1968   2 kids   3 grandkids      Enjoys travel to [place].      Outpatient Encounter Medications as of 04/11/2017  Medication Sig  . amLODipine (NORVASC) 5 MG tablet Take 1 tablet (5 mg total) by mouth daily. (Patient taking differently: Take 5 mg by mouth as needed. )  . cetirizine (ZYRTEC) 10 MG tablet Take 10 mg by mouth as  needed.   . cyclobenzaprine (FLEXERIL) 10 MG tablet Take 0.5-1 tablets (5-10 mg total) by mouth 3 (three) times daily as needed for muscle spasms.  . Docosanol (ABREVA) 10 % CREA Apply topically as needed.   . docusate sodium (COLACE) 100 MG capsule Take 100 mg by mouth 2 (two) times daily as needed for mild constipation.  Marland Kitchen doxycycline (VIBRAMYCIN) 100 MG capsule Take 100 mg by mouth as needed.   . Echinacea 125 MG CAPS Take by mouth as needed. Twice daily.   Marland Kitchen ibuprofen (ADVIL,MOTRIN) 200 MG tablet Take 200 mg by mouth every 6 (six) hours as needed.  . Multiple Vitamins-Minerals (ALIVE MENS ENERGY) TABS Take by mouth daily.  Marland Kitchen oxybutynin (DITROPAN) 5 MG tablet Take 1 tablet (5 mg total) by mouth 2 (two) times daily as needed (urinary frequency).  . Oxycodone HCl 10 MG TABS Take 10 mg by mouth every 4 (four) hours as needed.  . zinc gluconate 50 MG tablet Take 50 mg by mouth as needed.    No facility-administered encounter medications on file as of 04/11/2017.     Activities of Daily Living In your present state of health, do you have any difficulty performing the following activities: 04/11/2017  Hearing? N  Vision? N  Difficulty concentrating or making decisions? N  Walking or climbing stairs? N  Dressing or bathing? N  Doing errands, shopping? N  Preparing Food and eating ? N  Using the Toilet? N  In the past six months, have you accidently leaked urine? N  Do you have problems with loss of bowel control? N  Managing your Medications? N  Managing your Finances? N  Housekeeping or managing your Housekeeping? N  Some recent data might be hidden    Patient Care Team: Tonia Ghent, MD as PCP - General (Family Medicine)   Assessment:   This is a routine wellness examination for [name].   Hearing Screening   '125Hz'$  '250Hz'$  '500Hz'$  '1000Hz'$  '2000Hz'$  '3000Hz'$  '4000Hz'$  '6000Hz'$  '8000Hz'$   Right ear:   40 40 40  0    Left ear:   40 40 40  0      Visual Acuity Screening   Right eye Left eye  Both eyes  Without correction: 20/20 20/20 20/15-1  With correction:        Exercise Activities and Dietary recommendations Current Exercise Habits: Home exercise routine, Type of exercise: walking, Time (Minutes): 30, Frequency (Times/Week): 4, Weekly Exercise (Minutes/Week): 120, Intensity: Moderate, Exercise limited by: None identified  Goals    .  Increase physical activity     Starting 04/11/2017, I will continue to exercise for 30 min 3-5 days per week.        Fall Risk Fall Risk  04/11/2017 11/30/2015 10/13/2014 10/11/2012  Falls in the past year? No No No No   Depression Screen PHQ 2/9 Scores 04/11/2017 11/30/2015 01/01/2015 10/13/2014  PHQ - 2 Score 0 0 0 0  PHQ- 9 Score 0 - - -    Cognitive Function MMSE - Mini Mental State Exam 04/11/2017 10/13/2014  Not completed: - Unable to complete  Orientation to time 5 -  Orientation to Place 5 -  Registration 3 -  Attention/ Calculation 0 -  Recall 3 -  Language- name 2 objects 0 -  Language- repeat 1 -  Language- follow 3 step command 3 -  Language- read & follow direction 0 -  Write a sentence 0 -  Copy design 0 -  Total score 20 -       PLEASE NOTE: A Mini-Cog screen was completed. Maximum score is 20. A value of 0 denotes this part of Folstein MMSE was not completed or the patient failed this part of the Mini-Cog screening.   Mini-Cog Screening Orientation to Time - Max 5 pts Orientation to Place - Max 5 pts Registration - Max 3 pts Recall - Max 3 pts Language Repeat - Max 1 pts Language Follow 3 Step Command - Max 3 pts   Screening Tests Health Maintenance  Topic Date Due  . INFLUENZA VACCINE  12/12/2017 (Originally 11/15/2016)  . TETANUS/TDAP  04/11/2018 (Originally 06[birth year]/1965)  . PNA vac Low Risk Adult (1 of 2 - PCV13) 04/11/2018 (Originally 06/13/2011)  . COLONOSCOPY  02/15/2021  . Hepatitis C Screening  Completed     Plan:     I have personally reviewed, addressed, and noted the following in the  patient's chart:  A. Medical and social history B. Use of alcohol, tobacco or illicit drugs  C. Current medications and supplements D. Functional ability and status E.  Nutritional status F.  Physical activity G. Advance directives H. List of other physicians I.  Hospitalizations, surgeries, and ER visits in previous 12 months J.  [place] to include hearing, vision, cognitive, depression L. Referrals and appointments - none  In addition, I have reviewed and discussed with patient certain preventive protocols, quality metrics, and best practice recommendations. A written personalized care plan for preventive services as well as general preventive health recommendations were provided to patient.  See attached scanned questionnaire for additional information.   Signed,   Lindell Noe, MHA, BS, LPN Health Coach

## 2017-04-11 NOTE — Patient Instructions (Signed)
[name] , Thank you for taking time to come for your Medicare Wellness Visit. I appreciate your ongoing commitment to your health goals. Please review the following plan we discussed and let me know if I can assist you in the future.   These are the goals we discussed: Goals    . Increase physical activity     Starting 04/11/2017, I will continue to exercise for 30 min 3-5 days per week.        This is a list of the screening recommended for you and due dates:  Health Maintenance  Topic Date Due  . Flu Shot  12/12/2017*  . Tetanus Vaccine  04/11/2018*  . Pneumonia vaccines (1 of 2 - PCV13) 04/11/2018*  . Colon Cancer Screening  02/15/2021  .  Hepatitis C: One time screening is recommended by Center for Disease Control  (CDC) for  adults born from [birth year] through 1964.   Completed  *Topic was postponed. The date shown is not the original due date.   Preventive Care for Adults  A healthy lifestyle and preventive care can promote health and wellness. Preventive health guidelines for adults include the following key practices.  . A routine yearly physical is a good way to check with your health care provider about your health and preventive screening. It is a chance to share any concerns and updates on your health and to receive a thorough exam.  . Visit your dentist for a routine exam and preventive care every 6 months. Brush your teeth twice a day and floss once a day. Good oral hygiene prevents tooth decay and gum disease.  . The frequency of eye exams is based on your age, health, family medical history, use  of contact lenses, and other factors. Follow your health care provider's recommendations for frequency of eye exams.  . Eat a healthy diet. Foods like vegetables, fruits, whole grains, low-fat dairy products, and lean protein foods contain the nutrients you need without too many calories. Decrease your intake of foods high in solid fats, added sugars, and salt. Eat the right  amount of calories for you. Get information about a proper diet from your health care provider, if necessary.  . Regular physical exercise is one of the most important things you can do for your health. Most adults should get at least 150 minutes of moderate-intensity exercise (any activity that increases your heart rate and causes you to sweat) each week. In addition, most adults need muscle-strengthening exercises on 2 or more days a week.  Silver Sneakers may be a benefit available to you. To determine eligibility, you may visit the website: www.silversneakers.com or contact program at 573-041-3579 Mon-Fri between 8AM-8PM.   . Maintain a healthy weight. The body mass index (BMI) is a screening tool to identify possible weight problems. It provides an estimate of body fat based on height and weight. Your health care provider can find your BMI and can help you achieve or maintain a healthy weight.   For adults 20 years and older: ? A BMI below 18.5 is considered underweight. ? A BMI of 18.5 to 24.9 is normal. ? A BMI of 25 to 29.9 is considered overweight. ? A BMI of 30 and above is considered obese.   . Maintain normal blood lipids and cholesterol levels by exercising and minimizing your intake of saturated fat. Eat a balanced diet with plenty of fruit and vegetables. Blood tests for lipids and cholesterol should begin at age 41 and be repeated  every 5 years. If your lipid or cholesterol levels are high, you are over 50, or you are at high risk for heart disease, you may need your cholesterol levels checked more frequently. Ongoing high lipid and cholesterol levels should be treated with medicines if diet and exercise are not working.  . If you smoke, find out from your health care provider how to quit. If you do not use tobacco, please do not start.  . If you choose to drink alcohol, please do not consume more than 2 drinks per day. One drink is considered to be 12 ounces (355 mL) of beer, 5  ounces (148 mL) of wine, or 1.5 ounces (44 mL) of liquor.  . If you are 65-97 years old, ask your health care provider if you should take aspirin to prevent strokes.  . Use sunscreen. Apply sunscreen liberally and repeatedly throughout the day. You should seek shade when your shadow is shorter than you. Protect yourself by wearing long sleeves, pants, a wide-brimmed hat, and sunglasses year round, whenever you are outdoors.  . Once a month, do a whole body skin exam, using a mirror to look at the skin on your back. Tell your health care provider of new moles, moles that have irregular borders, moles that are larger than a pencil eraser, or moles that have changed in shape or color.

## 2017-04-11 NOTE — Progress Notes (Signed)
Pre visit review using our clinic review tool, if applicable. No additional management support is needed unless otherwise documented below in the visit note. 

## 2017-04-11 NOTE — Progress Notes (Signed)
   Subjective:    Patient ID: [name], male    DOB: February 19, 1946, 71 y.o.   MRN: [id number]  HPI I reviewed health advisor's note, was available for consultation, and agree with documentation and plan.    Review of Systems     Objective:   Physical Exam        Assessment & Plan:

## 2017-04-11 NOTE — Progress Notes (Signed)
PCP notes:   Health maintenance:  Tetanus - postponed/insurance Flu vaccine - pt declined PNA vaccines - pt declined  Abnormal screenings:   Hearing-failed  Hearing Screening   125Hz  250Hz  500Hz  1000Hz  2000Hz  3000Hz  4000Hz  6000Hz  8000Hz   Right ear:   40 40 40  0    Left ear:   40 40 40  0     Patient concerns:   Pain in knees when walking downstairs.  Nurse concerns:  None  Next PCP appt:   04/20/17 @ 1515

## 2017-04-18 DIAGNOSIS — M25561 Pain in right knee: Secondary | ICD-10-CM | POA: Diagnosis not present

## 2017-04-18 DIAGNOSIS — M25562 Pain in left knee: Secondary | ICD-10-CM | POA: Diagnosis not present

## 2017-04-20 ENCOUNTER — Encounter: Payer: Self-pay | Admitting: Family Medicine

## 2017-04-20 ENCOUNTER — Ambulatory Visit (INDEPENDENT_AMBULATORY_CARE_PROVIDER_SITE_OTHER): Payer: PPO | Admitting: Family Medicine

## 2017-04-20 VITALS — BP 134/88 | HR 87 | Temp 98.6°F | Ht 72.5 in | Wt 223.5 lb

## 2017-04-20 DIAGNOSIS — I10 Essential (primary) hypertension: Secondary | ICD-10-CM | POA: Diagnosis not present

## 2017-04-20 DIAGNOSIS — Z7189 Other specified counseling: Secondary | ICD-10-CM

## 2017-04-20 DIAGNOSIS — Z0000 Encounter for general adult medical examination without abnormal findings: Secondary | ICD-10-CM

## 2017-04-20 NOTE — Patient Instructions (Addendum)
Check with your insurance to see if they will cover the shingrix and Tdap shots.   I would get a flu shot each fall.   Check on getting a pneumonia shot at the pharmacy (check on getting the PNA-13/Prevnar vaccine).   We can get you the flu and/or pneumonia shot here if you desire.  You would need a nurse appointment.  Take care.  Glad to see you.  Don't change your meds for now.  Update me as needed.

## 2017-04-20 NOTE — Progress Notes (Signed)
Flu vaccine - pt declined, d/w pt.  PNA vaccines - pt declined, d/w pt.  Hearing-failed.  Declined hearing aids.   PSA wnl.  D/w pt.  He is okay with not checking PSA next year assuming his sx are not different.  He uses oxybutynin prn on the weekend when working.  Colonoscopy 2012.  Tetanus d/w pt.  See AVS.  Shingles d/w pt.  See AVS.  Advance directive- wife designated if patient were incapacitated.   Pain in knees when walking downstairs.  Prev knee injection.  On meloxicam in the meantime.  Injection helped with the pain.   His back pain is clearly better after the injection.  He hasn't needed pain meds in the meantime. BP controlled without amlodipine since back pain was controlled.    Benadryl cream helped with nasal rash, was able to stop the doxy.   Still using CPAP at home.  It helps with snoring.   PMH and SH reviewed  ROS: Per HPI unless specifically indicated in ROS section   Meds, vitals, and allergies reviewed.   GEN: nad, alert and oriented HEENT: mucous membranes moist NECK: supple w/o LA CV: rrr PULM: ctab, no inc wob ABD: soft, +bs EXT: no edema SKIN: no acute rash

## 2017-04-23 ENCOUNTER — Telehealth: Payer: Self-pay | Admitting: Family Medicine

## 2017-04-23 DIAGNOSIS — Z0000 Encounter for general adult medical examination without abnormal findings: Secondary | ICD-10-CM | POA: Insufficient documentation

## 2017-04-23 NOTE — Assessment & Plan Note (Signed)
Advance directive- wife designated if patient were incapacitated.  

## 2017-04-23 NOTE — Telephone Encounter (Signed)
appt scheduled per pt. Pt will ck with ins co to see if shingrix is covered at office or pharmacy.

## 2017-04-23 NOTE — Assessment & Plan Note (Signed)
Flu vaccine - pt declined, d/w pt.  PNA vaccines - pt declined, d/w pt.  Hearing-failed.  Declined hearing aids.   PSA wnl.  D/w pt.  He is okay with not checking PSA next year assuming his sx are not different.  He uses oxybutynin prn on the weekend when working.  Colonoscopy 2012.  Tetanus d/w pt.  See AVS.  Shingles d/w pt.  See AVS.  Advance directive- wife designated if patient were incapacitated.

## 2017-04-23 NOTE — Telephone Encounter (Signed)
Copied from [place] [id number]. Topic: Quick Communication - See Telephone Encounter >> Apr 23, 2017  2:35 PM Cleaster Corin, NT wrote: CRM for notification. See Telephone encounter for:   04/23/17. Pt. Wanting to schedule (PNA 13 and shingles) but needed it done with in a week apart. Pt. Was told that the shingles shot was not avb. Pt. Can be reached at [phone]

## 2017-04-23 NOTE — Assessment & Plan Note (Signed)
Related to back pain.  Off amlodipine currently.  When back pain is better, his BP is controlled and he doesn't need med. Continue as is.  Update me as needed.  Labs d/w pt.  He agrees.  >15 minutes spent in face to face time with patient, >50% spent in counselling or coordination of care.

## 2017-05-01 ENCOUNTER — Ambulatory Visit (INDEPENDENT_AMBULATORY_CARE_PROVIDER_SITE_OTHER): Payer: PPO

## 2017-05-01 DIAGNOSIS — Z23 Encounter for immunization: Secondary | ICD-10-CM

## 2017-05-01 DIAGNOSIS — H52223 Regular astigmatism, bilateral: Secondary | ICD-10-CM | POA: Diagnosis not present

## 2017-05-09 DIAGNOSIS — I83009 Varicose veins of unspecified lower extremity with ulcer of unspecified site: Secondary | ICD-10-CM | POA: Diagnosis not present

## 2017-05-09 DIAGNOSIS — I872 Venous insufficiency (chronic) (peripheral): Secondary | ICD-10-CM | POA: Diagnosis not present

## 2017-05-30 DIAGNOSIS — M545 Low back pain: Secondary | ICD-10-CM | POA: Diagnosis not present

## 2017-05-30 DIAGNOSIS — M25562 Pain in left knee: Secondary | ICD-10-CM | POA: Diagnosis not present

## 2017-05-30 DIAGNOSIS — M25561 Pain in right knee: Secondary | ICD-10-CM | POA: Diagnosis not present

## 2017-07-10 ENCOUNTER — Telehealth: Payer: Self-pay | Admitting: Family Medicine

## 2017-07-10 NOTE — Telephone Encounter (Signed)
I spoke with pt and he has been taking ranitidine 150 mg once daily prn; usually depends if pts needs ranitidine depending on what food pt eats. Dr Damita Dunnings has not filled before; Dr Linna Darner filled last 2015. Pt said he is still using the ranitidine that was given in 2015. [facility].Please advise. Annual exam on 04/20/17.

## 2017-07-10 NOTE — Telephone Encounter (Signed)
Copied from [place] [id number]. Topic: Quick Communication - See Telephone Encounter >> Jul 10, 2017 12:25 PM Robina Ade, Helene Kelp D wrote: CRM for notification. See Telephone encounter for: 07/10/17. Patient called and said that he would like to talk to Dr. Damita Dunnings or his CMA about starting him on the medication ranitidine 150 mg. Dr. Linna Darner used to give it to him to take as needed. Please call patient back, thanks.

## 2017-07-11 DIAGNOSIS — M25561 Pain in right knee: Secondary | ICD-10-CM | POA: Diagnosis not present

## 2017-07-11 DIAGNOSIS — M25562 Pain in left knee: Secondary | ICD-10-CM | POA: Diagnosis not present

## 2017-07-11 MED ORDER — RANITIDINE HCL 150 MG PO TABS: 150.0000 mg | ORAL_TABLET | Freq: Every day | ORAL | 3 refills | Status: DC | PRN

## 2017-07-11 NOTE — Telephone Encounter (Signed)
rx sent.  Thanks.  

## 2017-07-11 NOTE — Telephone Encounter (Signed)
Patient notified by telephone that script has been sent to the pharmacy. 

## 2017-07-12 DIAGNOSIS — M5416 Radiculopathy, lumbar region: Secondary | ICD-10-CM | POA: Diagnosis not present

## 2017-10-24 DIAGNOSIS — M25561 Pain in right knee: Secondary | ICD-10-CM | POA: Diagnosis not present

## 2017-10-24 DIAGNOSIS — M25562 Pain in left knee: Secondary | ICD-10-CM | POA: Diagnosis not present

## 2017-10-29 ENCOUNTER — Ambulatory Visit: Payer: Self-pay

## 2017-10-29 DIAGNOSIS — M65331 Trigger finger, right middle finger: Secondary | ICD-10-CM | POA: Diagnosis not present

## 2017-10-29 DIAGNOSIS — M170 Bilateral primary osteoarthritis of knee: Secondary | ICD-10-CM | POA: Diagnosis not present

## 2017-10-29 DIAGNOSIS — M25562 Pain in left knee: Secondary | ICD-10-CM | POA: Diagnosis not present

## 2017-10-29 DIAGNOSIS — M25561 Pain in right knee: Secondary | ICD-10-CM | POA: Diagnosis not present

## 2017-10-29 NOTE — Telephone Encounter (Signed)
Patient called in with c/o "open wound that is not healing." He says "it's at the skin graft site, my left lower leg above my ankle. There is a opening that has been open for the past year and it's just not healing. I have been trying to care for it using new skin, but it just peels off. I can see the bone." I asked about redness, draining, he says "it is red around it and inflamed, but drains clear." I asked about fever, any other symptoms, he denies. According to protocol, see PCP within 24 hours, appointment scheduled for tomorrow, 10/30/17 at 1200 with Dr. Damita Dunnings, care advice given, patient verbalized understanding.  Reason for Disposition . [1] Red area or streak AND [2] no fever  Answer Assessment - Initial Assessment Questions 1. LOCATION: "Where is the wound located?"      Left lower leg above ankle 2. WOUND APPEARANCE: "What does the wound look like?"      Can see the bone 3. SIZE: If redness is present, ask: "What is the size of the red area?" (Inches, centimeters, or compare to size of a coin)      Maybe less than 1/2 wide where the skin graft is located 4. SPREAD: "What's changed in the last day?"  "Do you see any red streaks coming from the wound?"     Redness around the area 5. ONSET: "When did it start to look infected?"      Not really infected, just redness, inflammed 6. 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 PAIN: "Is there any pain?" If so, ask: "How bad is the pain?"   (Scale 1-10; or mild, moderate, severe)     No 8. FEVER: "Do you have a fever?" If so, ask: "What is your temperature, how was it measured, and when did it start?"     No 9. OTHER SYMPTOMS: "Do you have any other symptoms?" (e.g., shaking chills, weakness, rash elsewhere on body)     No 10. PREGNANCY: "Is there any chance you are pregnant?" "When was your last menstrual period?"       N/A  Protocols used: WOUND INFECTION-A-AH

## 2017-10-30 ENCOUNTER — Ambulatory Visit (INDEPENDENT_AMBULATORY_CARE_PROVIDER_SITE_OTHER)
Admission: RE | Admit: 2017-10-30 | Discharge: 2017-10-30 | Disposition: A | Discharge status: Home / Self Care | Payer: PPO | Source: Ambulatory Visit | Attending: Family Medicine | Admitting: Family Medicine

## 2017-10-30 ENCOUNTER — Ambulatory Visit (INDEPENDENT_AMBULATORY_CARE_PROVIDER_SITE_OTHER): Payer: PPO | Admitting: Family Medicine

## 2017-10-30 VITALS — BP 152/84 | HR 71 | Temp 98.5°F | Ht 72.5 in | Wt 221.5 lb

## 2017-10-30 DIAGNOSIS — Z8781 Personal history of (healed) traumatic fracture: Secondary | ICD-10-CM | POA: Diagnosis not present

## 2017-10-30 DIAGNOSIS — L989 Disorder of the skin and subcutaneous tissue, unspecified: Secondary | ICD-10-CM | POA: Diagnosis not present

## 2017-10-30 DIAGNOSIS — I10 Essential (primary) hypertension: Secondary | ICD-10-CM

## 2017-10-30 DIAGNOSIS — M1712 Unilateral primary osteoarthritis, left knee: Secondary | ICD-10-CM | POA: Diagnosis not present

## 2017-10-30 NOTE — Patient Instructions (Addendum)
Check your BP out of clinic and if persistently >140/>90, then start back on the amlodipine 5mg  a day and update me as needed.   Go to the lab on the way out.  We'll contact you with your xray report. We will call about your referral.  Rosaria Ferries or Azalee Course will call you if you don't see one of them on the way out.  Keep the area covered in the meantime.  If redness or pus draining then let me know.  Take care.  Glad to see you.

## 2017-10-30 NOTE — Progress Notes (Signed)
He has been off BP med, d/w pt about check out of clinic.  No CP, SOB, BLE edema.    L medial shin wound.  Has been open for months, he has been keeping it covered.  He had bumped it to open it up initially, months ago.  He has local numbness from old injury in 1955 with surgery and casting at that point.    No FCNAVD.  Some occ clear drainage on the bandaid.    nad ncat L shin with 12x3 mm clean based superficial skin disruption in the middle of a scar on the L shin.  I am not certain that it goes down to the bone as the lesion is so narrow.  It does appear to be superficial.  It is not draining.  No spreading redness.  He has local decrease in sensation related to previous injury, at baseline.  Distally he has normal capillary refill and dorsalis pedis pulse.

## 2017-10-31 DIAGNOSIS — L989 Disorder of the skin and subcutaneous tissue, unspecified: Secondary | ICD-10-CM | POA: Insufficient documentation

## 2017-10-31 NOTE — Assessment & Plan Note (Signed)
We talked about options.  Reasonable to check plain films given his fracture history.  I covered the area with a hydrocolloid dressing.  He was asking if we could suture this lesion but I told him that was not a good idea.  I would like him to see the wound clinic.  He agrees with all this.  Still okay for outpatient follow-up.

## 2017-10-31 NOTE — Assessment & Plan Note (Signed)
See after visit summary regarding calcium channel blocker use.  He will check his blood pressure at home and update me as needed.

## 2017-11-05 DIAGNOSIS — M48061 Spinal stenosis, lumbar region without neurogenic claudication: Secondary | ICD-10-CM | POA: Diagnosis not present

## 2017-11-05 DIAGNOSIS — M170 Bilateral primary osteoarthritis of knee: Secondary | ICD-10-CM | POA: Diagnosis not present

## 2017-11-12 DIAGNOSIS — M170 Bilateral primary osteoarthritis of knee: Secondary | ICD-10-CM | POA: Diagnosis not present

## 2017-11-13 ENCOUNTER — Other Ambulatory Visit: Payer: Self-pay | Admitting: Physician Assistant

## 2017-11-13 ENCOUNTER — Other Ambulatory Visit
Admission: RE | Admit: 2017-11-13 | Discharge: 2017-11-13 | Disposition: A | Discharge status: Home / Self Care | Payer: PPO | Source: Ambulatory Visit | Attending: Physician Assistant | Admitting: Physician Assistant

## 2017-11-13 ENCOUNTER — Encounter: Payer: PPO | Attending: Physician Assistant | Admitting: Physician Assistant

## 2017-11-13 ENCOUNTER — Ambulatory Visit: Payer: PPO | Admitting: Physician Assistant

## 2017-11-13 DIAGNOSIS — S81801A Unspecified open wound, right lower leg, initial encounter: Secondary | ICD-10-CM | POA: Insufficient documentation

## 2017-11-13 DIAGNOSIS — I10 Essential (primary) hypertension: Secondary | ICD-10-CM | POA: Diagnosis not present

## 2017-11-13 DIAGNOSIS — L97814 Non-pressure chronic ulcer of other part of right lower leg with necrosis of bone: Secondary | ICD-10-CM | POA: Insufficient documentation

## 2017-11-13 DIAGNOSIS — G4730 Sleep apnea, unspecified: Secondary | ICD-10-CM | POA: Diagnosis not present

## 2017-11-13 DIAGNOSIS — L97826 Non-pressure chronic ulcer of other part of left lower leg with bone involvement without evidence of necrosis: Secondary | ICD-10-CM | POA: Diagnosis not present

## 2017-11-13 DIAGNOSIS — X58XXXA Exposure to other specified factors, initial encounter: Secondary | ICD-10-CM | POA: Diagnosis not present

## 2017-11-13 DIAGNOSIS — M86362 Chronic multifocal osteomyelitis, left tibia and fibula: Secondary | ICD-10-CM | POA: Diagnosis not present

## 2017-11-14 ENCOUNTER — Ambulatory Visit: Payer: PPO

## 2017-11-15 LAB — AEROBIC CULTURE  (SUPERFICIAL SPECIMEN): Gram Stain: NONE SEEN

## 2017-11-15 LAB — AEROBIC CULTURE W GRAM STAIN (SUPERFICIAL SPECIMEN): Culture: NORMAL

## 2017-11-16 DIAGNOSIS — S81802A Unspecified open wound, left lower leg, initial encounter: Secondary | ICD-10-CM | POA: Diagnosis not present

## 2017-11-19 ENCOUNTER — Encounter: Payer: PPO | Attending: Physician Assistant | Admitting: Physician Assistant

## 2017-11-19 DIAGNOSIS — L03116 Cellulitis of left lower limb: Secondary | ICD-10-CM | POA: Diagnosis not present

## 2017-11-19 DIAGNOSIS — L97816 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 involvement without evidence of necrosis: Secondary | ICD-10-CM | POA: Diagnosis not present

## 2017-11-19 DIAGNOSIS — I10 Essential (primary) hypertension: Secondary | ICD-10-CM | POA: Diagnosis not present

## 2017-11-19 DIAGNOSIS — L97826 Non-pressure chronic ulcer of other part of left lower leg with bone involvement without evidence of necrosis: Secondary | ICD-10-CM | POA: Diagnosis not present

## 2017-11-24 ENCOUNTER — Ambulatory Visit
Admission: RE | Admit: 2017-11-24 | Discharge: 2017-11-24 | Disposition: A | Discharge status: Home / Self Care | Payer: PPO | Source: Ambulatory Visit | Attending: Physician Assistant | Admitting: Physician Assistant

## 2017-11-24 DIAGNOSIS — L97814 Non-pressure chronic ulcer of other part of right lower leg with necrosis of bone: Secondary | ICD-10-CM | POA: Diagnosis not present

## 2017-11-24 DIAGNOSIS — L97829 Non-pressure chronic ulcer of other part of left lower leg with unspecified severity: Secondary | ICD-10-CM | POA: Diagnosis not present

## 2017-11-25 NOTE — Progress Notes (Signed)
[name] ([id number]) Visit Report for 11/13/2017 Chief Complaint Document Details Patient Name: [name], [name]. Date of Service: 11/13/2017 9:45 AM Medical Record Number: [id number] Patient Account Number: [id number] Date of Birth/Sex: 01-04-1946 (72 y.o. Male) Treating RN: Ahmed Prima Primary Care Provider: Elsie Stain Other Clinician: Referring Provider: Elsie Stain Treating Provider/Extender: Melburn Hake, HOYT Weeks in Treatment: 0 Information Obtained from: Patient Chief Complaint Right leg ulcer Electronic Signature(s) Signed: 11/14/2017 9:00:40 AM By: Worthy Keeler PA-C Entered By: Worthy Keeler on 11/14/2017 08:40:48 [name] ([id number]) -------------------------------------------------------------------------------- HPI Details Patient Name: [name] Date of Service: 11/13/2017 9:45 AM Medical Record Number: [id number] Patient Account Number: [id number] Date of Birth/Sex: 1945/10/30 (72 y.o. Male) Treating RN: Ahmed Prima Primary Care Provider: Elsie Stain Other Clinician: Referring Provider: Elsie Stain Treating Provider/Extender: Melburn Hake, HOYT Weeks in Treatment: 0 History of Present Illness HPI Description: 11/13/17 patient presents today for initial evaluation and our clinic due to an issue he is having with his left lower extremity in the anterior portion. In 1955 he had a significant accident where he fractured his leg and subsequently had the had reconstructive surgery in this location. Nonetheless he states he has done fairly well over the years that time without any complication speak of. Subsequently when year ago he had some trauma potentially occurred to the site which led to an opening that really has not closed since that time. He really does not know what the trauma was in fact he tells me that he doesn't remember anything in particular it is possible this might've been something that just opened up on its  own he really does not know in the end. Nonetheless he's not really had any discomfort or pain although it does appear that there is bone exposed base of the wound. Being on the anterior shin overlying the tibia and with the thin tissue in this area anyway this is always going to be a difficult place to deal. The ulcer itself seems to have opened up along the surgical incision site. Electronic Signature(s) Signed: 11/14/2017 9:00:40 AM By: Worthy Keeler PA-C Entered By: Worthy Keeler on 11/14/2017 08:40:56 [name] ([id number]) -------------------------------------------------------------------------------- Physical Exam Details Patient Name: [name]. Date of Service: 11/13/2017 9:45 AM Medical Record Number: [id number] Patient Account Number: [id number] Date of Birth/Sex: 08/16/1945 (72 y.o. Male) Treating RN: Ahmed Prima Primary Care Provider: Elsie Stain Other Clinician: Referring Provider: Elsie Stain Treating Provider/Extender: Melburn Hake, HOYT Weeks in Treatment: 0 Constitutional patient is hypertensive.. pulse regular and within target range for patient.Marland Kitchen respirations regular, non-labored and within target range for patient.Marland Kitchen temperature within target range for patient.. Well-nourished and well-hydrated in no acute distress. Eyes conjunctiva clear no eyelid edema noted. pupils equal round and reactive to light and accommodation. Ears, Nose, Mouth, and Throat no gross abnormality of ear auricles or external auditory canals. normal hearing noted during conversation. mucus membranes moist. Respiratory normal breathing without difficulty. clear to auscultation bilaterally. Cardiovascular regular rate and rhythm with normal S1, S2. no clubbing, cyanosis, significant edema, <3 sec cap refill. Gastrointestinal (GI) soft, non-tender, non-distended, +BS. no ventral hernia noted. Musculoskeletal normal gait and posture. no significant deformity or  arthritic changes, no loss or range of motion, no clubbing. Psychiatric this patient is able to make decisions and demonstrates good insight into disease process. Alert and Oriented x 3. pleasant and cooperative. Notes On evaluation today patient's wound again did have bones noted at the base of the wound some  of this appeared to be somewhat necrotic due to the fact that it was flaking off. Again I do believe this is secondary to the bone being somewhat dry as well. Nonetheless it does make me more concerned for osteomyelitis. There really was not enough to sin for pathology although I did actually obtain a specimen bone to sin for culture. This was however several small specimens that were obtained. Subsequently I did review the patient's x-ray as well which revealed some questionable region in the tibia at the level of the ulcer as well is the fibula I actually contacted the radiologist to confirm about the tibial location since mainly the fibula was noted in the x-ray report. He subsequently did state that the knowledge now of knowing where the ulceration was felt that to be a likely be involved as well and what he suspects is a chronic osteomyelitis. Nonetheless the recommendation as before is still for [place] to consider him a ride. Electronic Signature(s) Signed: 11/14/2017 9:00:40 AM By: Worthy Keeler PA-C Entered By: Worthy Keeler on 11/14/2017 08:49:21 [name] ([id number]) -------------------------------------------------------------------------------- Physician Orders Details Patient Name: [name] Date of Service: 11/13/2017 9:45 AM Medical Record Number: [id number] Patient Account Number: [id number] Date of Birth/Sex: 06-Feb-1946 (72 y.o. Male) Treating RN: Cornell Barman Primary Care Provider: Elsie Stain Other Clinician: Referring Provider: Elsie Stain Treating Provider/Extender: Melburn Hake, HOYT Weeks in Treatment: 0 Verbal / Phone Orders: No Diagnosis  Coding Wound Cleansing Wound #1 Left,Proximal,Medial Lower Leg o Clean wound with Normal Saline. Wound #2 Left,Medial Lower Leg o Clean wound with Normal Saline. Wound #3 Left,Distal,Medial Lower Leg o Clean wound with Normal Saline. Anesthetic (add to Medication List) Wound #1 Left,Proximal,Medial Lower Leg o Topical Lidocaine 4% cream applied to wound bed prior to debridement (In Clinic Only). Wound #2 Left,Medial Lower Leg o Topical Lidocaine 4% cream applied to wound bed prior to debridement (In Clinic Only). Wound #3 Left,Distal,Medial Lower Leg o Topical Lidocaine 4% cream applied to wound bed prior to debridement (In Clinic Only). Skin Barriers/Peri-Wound Care Wound #1 Left,Proximal,Medial Lower Leg o Skin Prep Wound #2 Left,Medial Lower Leg o Skin Prep Wound #3 Left,Distal,Medial Lower Leg o Skin Prep Primary Wound Dressing Wound #1 Left,Proximal,Medial Lower Leg o Xeroform - with hydrogel Wound #2 Left,Medial Lower Leg o Xeroform - with hydrogel Wound #3 Left,Distal,Medial Lower Leg o Xeroform - with hydrogel Secondary Dressing Wound #1 Left,Proximal,Medial Lower Leg o Boardered Foam Dressing Previti, Beauregard K. ([id number]) Wound #2 Left,Medial Lower Leg o Boardered Foam Dressing Wound #3 Left,Distal,Medial Lower Leg o Boardered Foam Dressing Dressing Change Frequency Wound #1 Left,Proximal,Medial Lower Leg o Three times weekly Wound #2 Left,Medial Lower Leg o Three times weekly Wound #3 Left,Distal,Medial Lower Leg o Three times weekly Follow-up Appointments Wound #1 Left,Proximal,Medial Lower Leg o Return Appointment in 1 week. Wound #2 Left,Medial Lower Leg o Return Appointment in 1 week. Wound #3 Left,Distal,Medial Lower Leg o Return Appointment in 1 week. Edema Control Wound #1 Left,Proximal,Medial Lower Leg o Elevate legs to the level of the heart and pump ankles as often as possible Wound #2 Left,Medial  Lower Leg o Elevate legs to the level of the heart and pump ankles as often as possible Wound #3 Left,Distal,Medial Lower Leg o Elevate legs to the level of the heart and pump ankles as often as possible Additional Orders / Instructions Wound #1 Left,Proximal,Medial Lower Leg o Vitamin A; Vitamin C, Zinc - Multivitamin ok Wound #2 Left,Medial Lower Leg o Vitamin A;  Vitamin C, Zinc - Multivitamin ok Wound #3 Left,Distal,Medial Lower Leg o Vitamin A; Vitamin C, Zinc - Multivitamin ok Laboratory o Bacteria identified in Wound by Culture (MICRO) - Bone oooo LOINC Code: 6222-9 oooo Convenience Name: Wound culture routine Radiology CLABORN, JANUSZ ([id number]) o MRI, lower extremity without contrast - Left Electronic Signature(s) Signed: 11/13/2017 9:24:47 PM By: Worthy Keeler PA-C Signed: 11/16/2017 6:17:48 PM By: Gretta Cool, BSN, RN, CWS, Kim RN, BSN Entered By: Gretta Cool, BSN, RN, CWS, Kim on 11/13/2017 11:13:52 [name] ([id number]) -------------------------------------------------------------------------------- Problem List Details Patient Name: EGIDIO, LOFGREN. Date of Service: 11/13/2017 9:45 AM Medical Record Number: [id number] Patient Account Number: [id number] Date of Birth/Sex: [birth year] (72 y.o. 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 Date of Service: 11/13/2017 9:45 AM Medical Record Number: [id number] Patient Account Number: [id number] Date of Birth/Sex: 07/25/1945 (72 y.o. Male) Treating RN: Ahmed Prima Primary Care Provider: Elsie Stain Other Clinician: Referring Provider: Elsie Stain Treating Provider/Extender: Melburn Hake, HOYT Weeks in Treatment: 0 Subjective Chief Complaint Information obtained from Patient Right leg ulcer History of Present Illness (HPI) 11/13/17 patient presents today for initial evaluation and our clinic due to an issue he is having with his left lower extremity in the anterior portion. In 1955 he had a significant accident where he fractured his leg and subsequently had the had reconstructive surgery in this location. Nonetheless he states he has done fairly well over the years that time without any complication speak of. Subsequently when year ago he had some trauma potentially occurred to the site which led to an opening that really has not closed since that time. He really does not know what the trauma was in fact he tells me that he doesn't remember anything in particular it is possible this might've been something that just opened up on its own he really does not know in the end. Nonetheless he's not really had any discomfort or pain although it does appear that there is bone exposed base of the wound. Being on the anterior shin overlying the tibia and with the thin tissue in this area anyway this is always going to be a difficult place to deal. The ulcer itself seems to have opened up along the surgical incision site. Wound History Patient presents with 1 open wound that has been present for approximately 1 year. Patient has been treating wound in the following manner: nuskin.  Laboratory tests have not been performed in the last month. Patient reportedly has not tested positive for an antibiotic resistant organism. Patient reportedly has not tested positive for osteomyelitis. Patient reportedly has not had testing performed to evaluate circulation in the legs. Patient History Allergies Flomax, morphine Family History Diabetes - Father, Heart Disease - Paternal Grandparents, Hypertension - Mother, Lung Disease - Mother, No family history of Cancer, Hereditary Spherocytosis, Kidney Disease, Seizures, Stroke, Thyroid Problems, Tuberculosis. Social History Never smoker, Marital Status -  Married, Alcohol Use - Never, Drug Use - No History, Caffeine Use - Moderate. Medical History Eyes Patient has history of Cataracts - removed Denies history of Glaucoma, Optic Neuritis Ear/Nose/Mouth/Throat Denies history of Chronic sinus problems/congestion, Middle ear problems Hematologic/Lymphatic Denies history of Anemia, Hemophilia, Human Immunodeficiency Virus, Lymphedema, Sickle Cell Disease Respiratory Patient has history of Sleep Apnea - CPAP Denies history of Aspiration, Asthma, Chronic Obstructive Pulmonary Disease (COPD), Pneumothorax, Tuberculosis Cardiovascular Abood, Dequavion K. ([id number]) Patient has history of Hypertension Denies history of Angina, Arrhythmia, Congestive Heart Failure, Coronary Artery Disease, Deep Vein Thrombosis, Hypotension, Myocardial Infarction, Peripheral Arterial Disease, Peripheral Venous Disease, Phlebitis, Vasculitis Gastrointestinal Denies history of Cirrhosis , Colitis, Crohn s, Hepatitis A, Hepatitis B, Hepatitis C Endocrine Denies history of Type I Diabetes, Type II Diabetes Genitourinary Denies history of End Stage Renal Disease Immunological Denies history of Lupus Erythematosus, Raynaud s, Scleroderma Integumentary (Skin) Denies history of History of Burn, History of pressure wounds Musculoskeletal Patient has history of  Osteoarthritis Denies history of Gout, Rheumatoid Arthritis Neurologic Denies history of Dementia, Neuropathy, Quadriplegia, Paraplegia, Seizure Disorder Oncologic Denies history of Received Chemotherapy, Received Radiation Psychiatric Denies history of Anorexia/bulimia, Confinement Anxiety Medical And Surgical History Notes 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 Eyes Denies complaints or symptoms of Dry Eyes, Vision Changes, Glasses / Contacts. Ear/Nose/Mouth/Throat Denies complaints or symptoms of Difficult clearing ears, Sinusitis. Hematologic/Lymphatic Denies complaints or symptoms of Bleeding / Clotting Disorders, Human Immunodeficiency Virus. Respiratory Denies complaints or symptoms of Chronic or frequent coughs, Shortness of Breath. Cardiovascular Denies complaints or symptoms of Chest pain, LE edema. Gastrointestinal Denies complaints or symptoms of Frequent diarrhea, Nausea, Vomiting. Endocrine Denies complaints or symptoms of Hepatitis, Thyroid disease, Polydypsia (Excessive Thirst). Genitourinary Denies complaints or symptoms of Kidney failure/ Dialysis, Incontinence/dribbling. Immunological Denies complaints or symptoms of Hives, Itching. Integumentary (Skin) Complains or has symptoms of Wounds. Denies complaints or symptoms of Bleeding or bruising tendency, Breakdown, Swelling. Musculoskeletal Denies complaints or symptoms of Muscle Pain, Muscle Weakness. Neurologic Denies complaints or symptoms of Numbness/parasthesias, Focal/Weakness. Psychiatric Denies complaints or symptoms of Anxiety, Claustrophobia. SEMISI, BIELA ([id number]) Objective Constitutional patient is hypertensive.. pulse regular and within target range for patient.Marland Kitchen respirations regular, non-labored and within target range for patient.Marland Kitchen  temperature within target range for patient.. Well-nourished and well-hydrated in no acute distress. Vitals Time Taken: 10:15 AM, Height: 74 in, Source: Measured, Weight: 220 lbs, Source: Measured, BMI: 28.2, Temperature: 98.2 F, Pulse: 79 bpm, Respiratory Rate: 16 breaths/min, Blood Pressure: 129/90 mmHg. Eyes conjunctiva clear no eyelid edema noted. pupils equal round and reactive to light and accommodation. Ears, Nose, Mouth, and Throat no gross abnormality of ear auricles or external auditory canals. normal hearing noted during conversation. mucus membranes moist. Respiratory normal breathing without difficulty. clear to auscultation bilaterally. Cardiovascular regular rate and rhythm with normal S1, S2. no clubbing, cyanosis, significant edema, Gastrointestinal (GI) soft, non-tender, non-distended, +BS. no ventral hernia noted. Musculoskeletal normal gait and posture. no significant deformity or arthritic changes, no loss or range of motion, no clubbing. Psychiatric this patient is able to make decisions and demonstrates good insight into disease process. Alert and Oriented x 3. pleasant and cooperative. General Notes: On evaluation today patient's wound again did have bones noted at the base of the wound some of this appeared to be somewhat necrotic due to the fact that it was flaking off. Again I do believe this is  secondary to the bone being somewhat dry as well. Nonetheless it does make me more concerned for osteomyelitis. There really was not enough to sin for pathology although I did actually obtain a specimen bone to sin for culture. This was however several small specimens that were obtained. Subsequently I did review the patient's x-ray as well which revealed some questionable region in the tibia at the level of the ulcer as well is the fibula I actually contacted the radiologist to confirm about the tibial location since mainly the fibula was noted in the x-ray report. He  subsequently did state that the knowledge now of knowing where the ulceration was felt that to be a likely be involved as well and what he suspects is a chronic osteomyelitis. Nonetheless the recommendation as before is still for [place] to consider him a ride. Integumentary (Hair, Skin) Wound #1 status is Open. Original cause of wound was Trauma. The wound is located on the Left,Proximal,Medial Lower Leg. The wound measures 0.1cm length x 0.1cm width x 0.1cm depth; 0.008cm^2 area and 0.001cm^3 volume. There is bone exposed. There is no tunneling or undermining noted. There is a medium amount of purulent drainage noted. The wound margin is flat and intact. There is no granulation within the wound bed. There is a large (67-100%) amount of necrotic tissue within the wound bed including Adherent Slough. The periwound skin appearance exhibited: Scarring. The periwound skin appearance did not exhibit: Callus, Crepitus, Excoriation, Induration, Rash, Dry/Scaly, Maceration, Atrophie Blanche, Cyanosis, Ecchymosis, Hemosiderin Staining, Mottled, Pallor, Rubor, Erythema. Periwound temperature was noted as No Tasker, Riot K. ([id number]) Abnormality. Wound #2 status is Open. Original cause of wound was Trauma. The wound is located on the Left,Medial Lower Leg. The wound measures 1.4cm length x 0.3cm width x 0.3cm depth; 0.33cm^2 area and 0.099cm^3 volume. There is bone exposed. There is no tunneling noted, however, there is undermining starting at 2:00 and ending at 7:00 with a maximum distance of 0.6cm. There is a medium amount of purulent drainage noted. The wound margin is flat and intact. There is no granulation within the wound bed. There is a large (67-100%) amount of necrotic tissue within the wound bed including Adherent Slough. The periwound skin appearance exhibited: Scarring. The periwound skin appearance did not exhibit: Callus, Crepitus, Excoriation, Induration, Rash, Dry/Scaly, Maceration,  Atrophie Blanche, Cyanosis, Ecchymosis, Hemosiderin Staining, Mottled, Pallor, Rubor, Erythema. Periwound temperature was noted as No Abnormality. Wound #3 status is Open. Original cause of wound was Trauma. The wound is located on the Left,Distal,Medial Lower Leg. The wound measures 0.2cm length x 0.1cm width x 0.2cm depth; 0.016cm^2 area and 0.003cm^3 volume. There is bone exposed. There is no tunneling or undermining noted. There is a small amount of purulent drainage noted. The wound margin is flat and intact. There is no granulation within the wound bed. There is a large (67-100%) amount of necrotic tissue within the wound bed including Adherent Slough. The periwound skin appearance exhibited: Scarring. The periwound skin appearance did not exhibit: Callus, Crepitus, Excoriation, Induration, Rash, Dry/Scaly, Maceration, Atrophie Blanche, Cyanosis, Ecchymosis, Hemosiderin Staining, Mottled, Pallor, Rubor, Erythema. Periwound temperature was noted as No Abnormality. 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 Wound #2 Left,Medial Lower Leg: Clean wound with Normal Saline. Wound #3 Left,Distal,Medial Lower Leg: Clean wound with Normal  Saline. Anesthetic (add to Medication List): Wound #1 Left,Proximal,Medial Lower Leg: Topical Lidocaine 4% cream applied to wound bed prior to debridement (In Clinic Only). Wound #2 Left,Medial Lower Leg: Topical Lidocaine 4% cream applied to wound bed prior to debridement (In Clinic Only). Wound #3 Left,Distal,Medial Lower Leg: Topical Lidocaine 4% cream applied to wound bed prior to debridement (In Clinic Only). Skin Barriers/Peri-Wound Care: Wound #1 Left,Proximal,Medial Lower Leg: Skin  Prep Wound #2 Left,Medial Lower Leg: Skin Prep SHYQUAN, STALLBAUMER. ([id number]) Wound #3 Left,Distal,Medial Lower Leg: Skin Prep Primary Wound Dressing: Wound #1 Left,Proximal,Medial Lower Leg: Xeroform - with hydrogel Wound #2 Left,Medial Lower Leg: Xeroform - with hydrogel Wound #3 Left,Distal,Medial Lower Leg: Xeroform - with hydrogel Secondary Dressing: Wound #1 Left,Proximal,Medial Lower Leg: Boardered Foam Dressing Wound #2 Left,Medial Lower Leg: Boardered Foam Dressing Wound #3 Left,Distal,Medial Lower Leg: Boardered Foam Dressing Dressing Change Frequency: Wound #1 Left,Proximal,Medial Lower Leg: Three times weekly Wound #2 Left,Medial Lower Leg: Three times weekly Wound #3 Left,Distal,Medial Lower Leg: Three times weekly Follow-up Appointments: Wound #1 Left,Proximal,Medial Lower Leg: Return Appointment in 1 week. Wound #2 Left,Medial Lower Leg: Return Appointment in 1 week. Wound #3 Left,Distal,Medial Lower Leg: Return Appointment in 1 week. Edema Control: Wound #1 Left,Proximal,Medial Lower Leg: Elevate legs to the level of the heart and pump ankles as often as possible Wound #2 Left,Medial Lower Leg: Elevate legs to the level of the heart and pump ankles as often as possible Wound #3 Left,Distal,Medial Lower Leg: Elevate legs to the level of the heart and pump ankles as often as possible Additional Orders / Instructions: Wound #1 Left,Proximal,Medial Lower Leg: Vitamin A; Vitamin C, Zinc - Multivitamin ok Wound #2 Left,Medial Lower Leg: Vitamin A; Vitamin C, Zinc - Multivitamin ok Wound #3 Left,Distal,Medial Lower Leg: Vitamin A; Vitamin C, Zinc - Multivitamin ok Laboratory ordered were: Wound culture routine - Bone Radiology ordered were: MRI, lower extremity without contrast - Left At this point the patient was not necessarily reluctant to do an MRI he was just reluctant to do an MRI with contrast. With that being said as I discussed previously with  the radiologist I do not believe for what we are looking for in regard to osteomyelitis that the patient has to have a with contrasting. Therefore my recommendation would be that we proceed with an MRI no contrast to evaluate for the possibility of osteomyelitis left tibia/fibula. He is in agreement with this plan. Subsequently we will also see him back for reevaluation in one weeks time in the meantime we will see what the results of bone culture showed. If you ends up that he does indeed have an infection then and antibiotics will need to be initiated in a referral to infectious disease made. He is in agreement with all of this. STEDMAN, [place] ([id number]) Please see above for specific wound care orders. We will see patient for re-evaluation in 1 week(s) here in the clinic. If anything worsens or changes patient will contact our office for additional recommendations. Electronic Signature(s) Signed: 11/14/2017 9:00:40 AM By: Worthy Keeler PA-C Entered By: Worthy Keeler on 11/14/2017 08:50:04 [name] ([id number]) -------------------------------------------------------------------------------- ROS/PFSH Details Patient Name: [name] Date of Service: 11/13/2017 9:45 AM Medical Record Number: [id number] Patient Account Number: [id number] Date of Birth/Sex: 16-May-1945 (72 y.o. Male) Treating RN: Montey Hora Primary Care Provider: Elsie Stain Other Clinician: Referring Provider: Elsie Stain Treating Provider/Extender: Melburn Hake, HOYT Weeks in Treatment: 0 Wound History Do you currently have one or more  open woundso Yes How many open wounds do you currently haveo 1 Approximately how long have you had your woundso 1 year How have you been treating your wound(s) until nowo nuskin Has your wound(s) ever healed and then re-openedo No Have you had any lab work done in the past montho No Have you tested positive for an antibiotic resistant organism (MRSA, VRE)o  No Have you tested positive for osteomyelitis (bone infection)o No Have you had any tests for circulation on your legso No Constitutional Symptoms (General Health) Complaints and Symptoms: Negative for: Fatigue; Fever; Chills; Marked Weight Change Eyes Complaints and Symptoms: Negative for: Dry Eyes; Vision Changes; Glasses / Contacts Medical History: Positive for: Cataracts - removed Negative for: Glaucoma; Optic Neuritis Ear/Nose/Mouth/Throat Complaints and Symptoms: Negative for: Difficult clearing ears; Sinusitis Medical History: Negative for: Chronic sinus problems/congestion; Middle ear problems Hematologic/Lymphatic Complaints and Symptoms: Negative for: Bleeding / Clotting Disorders; Human Immunodeficiency Virus Medical History: Negative for: Anemia; Hemophilia; Human Immunodeficiency Virus; Lymphedema; Sickle Cell Disease Respiratory Complaints and Symptoms: Negative for: Chronic or frequent coughs; Shortness of Breath Medical History: Positive for: Sleep Apnea - CPAP Negative for: Aspiration; Asthma; Chronic Obstructive Pulmonary Disease (COPD); Pneumothorax; Tuberculosis Kiesler, Keshawn K. ([id number]) Cardiovascular Complaints and Symptoms: Negative for: Chest pain; LE edema Medical History: Positive for: Hypertension Negative for: Angina; Arrhythmia; Congestive Heart Failure; Coronary Artery Disease; Deep Vein Thrombosis; Hypotension; Myocardial Infarction; Peripheral Arterial Disease; Peripheral Venous Disease; Phlebitis; Vasculitis Past Medical History Notes: heart murmur Gastrointestinal Complaints and Symptoms: Negative for: Frequent diarrhea; Nausea; Vomiting Medical History: Negative for: Cirrhosis ; Colitis; Crohnos; Hepatitis A; Hepatitis B; Hepatitis C Endocrine Complaints and Symptoms: Negative for: Hepatitis; Thyroid disease; Polydypsia (Excessive Thirst) Medical History: Negative for: Type I Diabetes; Type II Diabetes Genitourinary Complaints  and Symptoms: Negative for: Kidney failure/ Dialysis; Incontinence/dribbling Medical History: Negative for: End Stage Renal Disease Past Medical History Notes: HSV Immunological Complaints and Symptoms: Negative for: Hives; Itching Medical History: Negative for: Lupus Erythematosus; Raynaudos; Scleroderma Integumentary (Skin) Complaints and Symptoms: Positive for: Wounds Negative for: Bleeding or bruising tendency; Breakdown; Swelling Medical History: Negative for: History of Burn; History of pressure wounds Musculoskeletal Complaints and Symptoms: Negative for: Muscle Pain; Muscle Weakness Leflore, Dayna K. ([id number]) Medical History: Positive for: Osteoarthritis Negative for: Gout; Rheumatoid Arthritis Neurologic Complaints and Symptoms: Negative for: Numbness/parasthesias; Focal/Weakness Medical History: Negative for: Dementia; Neuropathy; Quadriplegia; Paraplegia; Seizure Disorder Psychiatric Complaints and Symptoms: Negative for: Anxiety; Claustrophobia Medical History: Negative for: Anorexia/bulimia; Confinement Anxiety Oncologic Medical History: Negative for: Received Chemotherapy; Received Radiation HBO Extended History Items Eyes: Cataracts Immunizations Pneumococcal Vaccine: Received Pneumococcal Vaccination: Yes Immunization Notes: up to date Implantable Devices Family and Social History Cancer: No; Diabetes: Yes - Father; Heart Disease: Yes - Paternal Grandparents; Hereditary Spherocytosis: No; Hypertension: Yes - Mother; Kidney Disease: No; Lung Disease: Yes - Mother; Seizures: No; Stroke: No; Thyroid Problems: No; Tuberculosis: No; Never smoker; Marital Status - Married; Alcohol Use: Never; Drug Use: No History; Caffeine Use: Moderate; Financial Concerns: No; Food, Clothing or Shelter Needs: No; Support System Lacking: No; Transportation Concerns: No; Advanced Directives: Yes; Patient does not want information on Advanced Directives; Medical Power  of Attorney: Yes - Tammy Rezas Electronic Signature(s) Signed: 11/13/2017 5:54:46 PM By: Montey Hora Signed: 11/13/2017 9:24:47 PM By: Worthy Keeler PA-C Entered By: Montey Hora on 11/13/2017 10:13:29 [name] ([id number]) -------------------------------------------------------------------------------- [place] Details Patient Name: [name]. Date of Service: 11/13/2017 Medical Record Number: [id number] Patient Account Number: [id number] Date of Birth/Sex: 14-Jul-1945 (72 y.o. 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 I10 Essential (primary) hypertension Facility Procedures CPT4 Code: [id number] Description: [phone] - WOUND CARE VISIT-LEV 5 EST PT Modifier: Quantity: 1 Physician Procedures CPT4 Code Description: [id number] - WC PHYS LEVEL 4 - NEW PT ICD-10 Diagnosis Description S81.801A Unspecified open wound, right lower leg, initial encounter L97.814 Non-pressure chronic ulcer of other part of right lower leg wi I10 Essential  (primary) hypertension Modifier: th necrosis of bo Quantity: 1 ne Electronic Signature(s) Signed: 11/13/2017 9:24:47 PM By: Worthy Keeler PA-C Entered By: Worthy Keeler on 11/13/2017 21:20:33

## 2017-11-25 NOTE — Progress Notes (Signed)
AVENIR, LOZINSKI ([id number]) Visit Report for 11/13/2017 Allergy List Details Patient Name: [name], [name]. Date of Service: 11/13/2017 9:45 AM Medical Record Number: [id number] Patient Account Number: [id number] Date of Birth/Sex: Jan 17, 1946 (72 y.o. Male) Treating RN: Montey Hora Primary Care Clois Treanor: Elsie Stain Other Clinician: Referring Beverley Allender: Elsie Stain Treating Kashina Mecum/Extender: STONE III, HOYT Weeks in Treatment: 0 Allergies Active Allergies Flomax morphine Allergy Notes Electronic Signature(s) Signed: 11/13/2017 5:54:46 PM By: Montey Hora Entered By: Montey Hora on 11/13/2017 10:06:08 [name] ([id number]) -------------------------------------------------------------------------------- Arrival Information Details Patient Name: [name] Date of Service: 11/13/2017 9:45 AM Medical Record Number: [id number] Patient Account Number: [id number] Date of Birth/Sex: [birth year] (72 y.o. Male) Treating RN: Montey Hora Primary Care Joplin Canty: Elsie Stain Other Clinician: Referring Jinger Middlesworth: Elsie Stain Treating Sallye Lunz/Extender: Melburn Hake, HOYT Weeks in Treatment: 0 Visit Information Patient Arrived: Ambulatory Arrival Time: 10:04 Accompanied By: spouse Transfer Assistance: None Patient Identification Verified: Yes Secondary Verification Process Yes Completed: Patient Has Alerts: Yes Patient Alerts: ABI [place] BILATERAL >220 Electronic Signature(s) Signed: 11/13/2017 5:54:46 PM By: Montey Hora Entered By: Montey Hora on 11/13/2017 10:38:41 [name] ([id number]) -------------------------------------------------------------------------------- Clinic Level of Care Assessment Details Patient Name: [name] Date of Service: 11/13/2017 9:45 AM Medical Record Number: [id number] Patient Account Number: [id number] Date of Birth/Sex: [birth year] (72 y.o. Male) Treating RN: Cornell Barman Primary Care Benedicto Capozzi:  Elsie Stain Other Clinician: Referring Markelle Asaro: Elsie Stain Treating Adianna Darwin/Extender: Melburn Hake, HOYT Weeks in Treatment: 0 Clinic Level of Care Assessment Items TOOL 2 Quantity Score []  - Use when only an EandM is performed on the INITIAL visit 0 ASSESSMENTS - Nursing Assessment / Reassessment X - General Physical Exam (combine w/ comprehensive assessment (listed just below) when 1 20 performed on new pt. evals) X- 1 25 Comprehensive Assessment (HX, ROS, Risk Assessments, Wounds Hx, etc.) ASSESSMENTS - Wound and Skin Assessment / Reassessment []  - Simple Wound Assessment / Reassessment - one wound 0 X- 3 5 Complex Wound Assessment / Reassessment - multiple wounds []  - 0 Dermatologic / Skin Assessment (not related to wound area) ASSESSMENTS - Ostomy and/or Continence Assessment and Care []  - Incontinence Assessment and Management 0 []  - 0 Ostomy Care Assessment and Management (repouching, etc.) PROCESS - Coordination of Care X - Simple Patient / Family Education for ongoing care 1 15 []  - 0 Complex (extensive) Patient / Family Education for ongoing care X- 1 10 Staff obtains Programmer, systems, Records, Test Results / Process Orders []  - 0 Staff telephones HHA, Nursing Homes / Clarify orders / etc []  - 0 Routine Transfer to another Facility (non-emergent condition) []  - 0 Routine Hospital Admission (non-emergent condition) X- 1 15 New Admissions / Biomedical engineer / Ordering NPWT, Apligraf, etc. []  - 0 Emergency Hospital Admission (emergent condition) X- 1 10 Simple Discharge Coordination []  - 0 Complex (extensive) Discharge Coordination PROCESS - Special Needs []  - Pediatric / Minor Patient Management 0 []  - 0 Isolation Patient Management LESLEY, GALENTINE. ([id number]) []  - 0 Hearing / Language / Visual special needs []  - 0 Assessment of Community assistance (transportation, D/C planning, etc.) []  - 0 Additional assistance / Altered mentation []  -  0 Support Surface(s) Assessment (bed, cushion, seat, etc.) INTERVENTIONS - Wound Cleansing / Measurement X - Wound Imaging (photographs - any number of wounds) 1 5 []  - 0 Wound Tracing (instead of photographs) []  - 0 Simple Wound Measurement - one wound X- 3 5 Complex Wound Measurement - multiple wounds []  - 0 Simple Wound Cleansing -  one wound X- 3 5 Complex Wound Cleansing - multiple wounds INTERVENTIONS - Wound Dressings []  - Small Wound Dressing one or multiple wounds 0 X- 1 15 Medium Wound Dressing one or multiple wounds []  - 0 Large Wound Dressing one or multiple wounds []  - 0 Application of Medications - injection INTERVENTIONS - Miscellaneous []  - External ear exam 0 X- 1 5 Specimen Collection (cultures, biopsies, blood, body fluids, etc.) X- 1 5 Specimen(s) / Culture(s) sent or taken to Lab for analysis []  - 0 Patient Transfer (multiple staff / Civil Service fast streamer / Similar devices) []  - 0 Simple Staple / Suture removal (25 or less) []  - 0 Complex Staple / Suture removal (26 or more) []  - 0 Hypo / Hyperglycemic Management (close monitor of Blood Glucose) X- 1 15 Ankle / Brachial Index (ABI) - do not check if billed separately Has the patient been seen at the hospital within the last three years: Yes Total Score: 185 Level Of Care: New/Established - Level 5 Electronic Signature(s) Signed: 11/16/2017 6:17:48 PM By: Gretta Cool, BSN, RN, CWS, Kim RN, BSN Entered By: Gretta Cool, BSN, RN, CWS, Kim on 11/13/2017 11:16:34 [name] ([id number]) -------------------------------------------------------------------------------- Encounter Discharge Information Details Patient Name: [name]. Date of Service: 11/13/2017 9:45 AM Medical Record Number: [id number] Patient Account Number: [id number] Date of Birth/Sex: 1945-09-27 (72 y.o. Male) Treating RN: Cornell Barman Primary Care Xareni Kelch: Elsie Stain Other Clinician: Referring Bevelyn Arriola: Elsie Stain Treating  Sylina Henion/Extender: Melburn Hake, HOYT Weeks in Treatment: 0 Encounter Discharge Information Items Discharge Condition: Stable Ambulatory Status: Ambulatory Discharge Destination: Home Transportation: Private Auto Schedule Follow-up Appointment: Yes Clinical Summary of Care: Electronic Signature(s) Signed: 11/16/2017 6:17:48 PM By: Gretta Cool, BSN, RN, CWS, Kim RN, BSN Entered By: Gretta Cool, BSN, RN, CWS, Kim on 11/13/2017 11:36:54 [name] ([id number]) -------------------------------------------------------------------------------- Lower Extremity Assessment Details Patient Name: HARSHAN, KEARLEY. Date of Service: 11/13/2017 9:45 AM Medical Record Number: [id number] Patient Account Number: [id number] Date of Birth/Sex: 1945-07-03 (72 y.o. Male) Treating RN: Montey Hora Primary Care Adolfo Granieri: Elsie Stain Other Clinician: Referring Marshawn Ninneman: Elsie Stain Treating Shadee Rathod/Extender: Melburn Hake, HOYT Weeks in Treatment: 0 Edema Assessment Assessed: [Left: No] [Right: No] [Left: Edema] [Right: :] Calf Left: Right: Point of Measurement: 35 cm From Medial Instep 35.3 cm 36.2 cm Ankle Left: Right: Point of Measurement: 12 cm From Medial Instep 24.6 cm 24 cm Vascular Assessment Pulses: Dorsalis Pedis Palpable: [Left:Yes] [Right:Yes] Doppler Audible: [Left:Yes] [Right:Yes] Posterior Tibial Palpable: [Left:Yes] [Right:Yes] Doppler Audible: [Left:Yes] [Right:Yes] Extremity colors, hair growth, and conditions: Extremity Color: [Left:Normal] [Right:Normal] Hair Growth on Extremity: [Left:Yes] [Right:Yes] Temperature of Extremity: [Left:Cool] [Right:Cool] Capillary Refill: [Left:< 3 seconds] [Right:< 3 seconds] Toe Nail Assessment Left: Right: Thick: Yes Yes Discolored: Yes Yes Deformed: Yes Yes Improper Length and Hygiene: No No Notes ABI [place] BILATERAL >220 Electronic Signature(s) Signed: 11/13/2017 5:54:46 PM By: Montey Hora Entered By: Montey Hora on 11/13/2017  10:38:15 [name] ([id number]) -------------------------------------------------------------------------------- Multi Wound Chart Details Patient Name: [name]. Date of Service: 11/13/2017 9:45 AM Medical Record Number: [id number] Patient Account Number: [id number] Date of Birth/Sex: 10-Jun-1945 (72 y.o. Male) Treating RN: Cornell Barman Primary Care Shenika Quint: Elsie Stain Other Clinician: Referring Rony Ratz: Elsie Stain Treating Latara Micheli/Extender: Melburn Hake, HOYT Weeks in Treatment: 0 Vital Signs Height(in): 74 Pulse(bpm): 79 Weight(lbs): 220 Blood Pressure(mmHg): 129/90 Body Mass Index(BMI): 28 Temperature(F): 98.2 Respiratory Rate 16 (breaths/min): Photos: [1:No Photos] [2:No Photos] [3:No Photos] Wound Location: [1:Left Lower Leg - Medial, Proximal] [2:Left Lower Leg - Medial] [3:Left Lower Leg -  Medial, Distal] Wounding Event: [1:Trauma] [2:Trauma] [3:Trauma] Primary Etiology: [1:Trauma, Other] [2:Trauma, Other] [3:Trauma, Other] Comorbid History: [1:Cataracts, Sleep Apnea, Hypertension, Osteoarthritis] [2:Cataracts, Sleep Apnea, Hypertension, Osteoarthritis] [3:Cataracts, Sleep Apnea, Hypertension, Osteoarthritis] Date Acquired: [1:11/13/2016] [2:11/13/2016] [3:11/13/2016] Weeks of Treatment: [1:0] [2:0] [3:0] Wound Status: [1:Open] [2:Open] [3:Open] Pending Amputation on [1:Yes] [2:Yes] [3:Yes] Presentation: Measurements L x W x D [1:0.1x0.1x0.1] [2:1.4x0.3x0.3] [3:0.2x0.1x0.2] (cm) Area (cm) : [1:0.008] [2:0.33] [3:0.016] Volume (cm) : [1:0.001] [2:0.099] [3:0.003] Starting Position 1 [2:2] (o'clock): Ending Position 1 [2:7] (o'clock): Maximum Distance 1 (cm): [2:0.6] Undermining: [1:No] [2:Yes] [3:No] Classification: [1:Full Thickness With Exposed Full Thickness With Exposed Support Structures] [2:Support Structures] [3:Full Thickness With Exposed Support Structures] Exudate Amount: [1:Medium] [2:Medium] [3:Small] Exudate Type: [1:Purulent]  [2:Purulent] [3:Purulent] Exudate Color: [1:yellow, brown, green] [2:yellow, brown, green] [3:yellow, brown, green] Wound Margin: [1:Flat and Intact] [2:Flat and Intact] [3:Flat and Intact] Granulation Amount: [1:None Present (0%)] [2:None Present (0%)] [3:None Present (0%)] Necrotic Amount: [1:Large (67-100%)] [2:Large (67-100%)] [3:Large (67-100%)] Exposed Structures: [1:Bone: Yes Fascia: No Fat Layer (Subcutaneous Tissue) Exposed: No Tendon: No Muscle: No Joint: No] [2:Bone: Yes Fascia: No Fat Layer (Subcutaneous Tissue) Exposed: No Tendon: No Muscle: No Joint: No] [3:Bone: Yes Fascia: No Fat Layer (Subcutaneous  Tissue) Exposed: No Tendon: No Muscle: No Joint: No] Epithelialization: None None None Periwound Skin Texture: Scarring: Yes Scarring: Yes Scarring: Yes Excoriation: No Excoriation: No Excoriation: No Induration: No Induration: No Induration: No Callus: No Callus: No Callus: No Crepitus: No Crepitus: No Crepitus: No Rash: No Rash: No Rash: No Periwound Skin Moisture: Maceration: No Maceration: No Maceration: No Dry/Scaly: No Dry/Scaly: No Dry/Scaly: No Periwound Skin Color: Atrophie Blanche: No Atrophie Blanche: No Atrophie Blanche: No Cyanosis: No Cyanosis: No Cyanosis: No Ecchymosis: No Ecchymosis: No Ecchymosis: No Erythema: No Erythema: No Erythema: No Hemosiderin Staining: No Hemosiderin Staining: No Hemosiderin Staining: No Mottled: No Mottled: No Mottled: No Pallor: No Pallor: No Pallor: No Rubor: No Rubor: No Rubor: No Temperature: No Abnormality No Abnormality No Abnormality Tenderness on Palpation: No No No Wound Preparation: Ulcer Cleansing: Ulcer Cleansing: Ulcer Cleansing: Rinsed/Irrigated with Saline Rinsed/Irrigated with Saline Rinsed/Irrigated with Saline Topical Anesthetic Applied: Topical Anesthetic Applied: Topical Anesthetic Applied: Other: lidocaine 4% Other: lidocaine 4% Other: lidocaine 4% Treatment  Notes Electronic Signature(s) Signed: 11/16/2017 6:17:48 PM By: Gretta Cool, BSN, RN, CWS, Kim RN, BSN Entered By: Gretta Cool, BSN, RN, CWS, Kim on 11/13/2017 11:01:48 [name] ([id number]) -------------------------------------------------------------------------------- [place] Details Patient Name: THEUS, ESPIN. Date of Service: 11/13/2017 9:45 AM Medical Record Number: [id number] Patient Account Number: [id number] Date of Birth/Sex: [birth year] (72 y.o. Male) Treating RN: Cornell Barman Primary Care Erdine Hulen: Elsie Stain Other Clinician: Referring Greenleigh Kauth: Elsie Stain Treating Artyom Stencel/Extender: Melburn Hake, HOYT Weeks in Treatment: 0 Active Inactive ` Necrotic Tissue Nursing Diagnoses: Knowledge deficit related to management of necrotic/devitalized tissue Goals: Necrotic/devitalized tissue will be minimized in the wound bed Date Initiated: 11/13/2017 Target Resolution Date: 11/16/2017 Goal Status: Active Interventions: Provide education on necrotic tissue and debridement process Treatment Activities: Apply topical anesthetic as ordered : 11/13/2017 Notes: ` Orientation to the Wound Care Program Nursing Diagnoses: Knowledge deficit related to the wound healing center program Goals: Patient/caregiver will verbalize understanding of the [place] Program Date Initiated: 11/13/2017 Target Resolution Date: 11/16/2017 Goal Status: Active Interventions: Provide education on orientation to the wound center Notes: ` Wound/Skin Impairment Nursing Diagnoses: Impaired tissue integrity Goals: Ulcer/skin breakdown will have a volume reduction of 30% by week 4 Date Initiated: 11/13/2017 Target Resolution Date: 12/14/2017 LENNART, GLADISH  Raliegh Ip ([id number]) Goal Status: Active Interventions: Assess patient/caregiver ability to obtain necessary supplies Treatment Activities: Skin care regimen initiated : 11/13/2017 Notes: Electronic Signature(s) Signed:  11/16/2017 6:17:48 PM By: Gretta Cool, BSN, RN, CWS, Kim RN, BSN Entered By: Gretta Cool, BSN, RN, CWS, Kim on 11/13/2017 11:00:42 [name] ([id number]) -------------------------------------------------------------------------------- Pain Assessment Details Patient Name: KELLER, BOUNDS. Date of Service: 11/13/2017 9:45 AM Medical Record Number: [id number] Patient Account Number: [id number] Date of Birth/Sex: 29-Oct-1945 (72 y.o. Male) Treating RN: Montey Hora Primary Care Shalva Rozycki: Elsie Stain Other Clinician: Referring Rayan Dyal: Elsie Stain Treating Iman Reinertsen/Extender: Melburn Hake, HOYT Weeks in Treatment: 0 Active Problems Location of Pain Severity and Description of Pain Patient Has Paino No Site Locations Pain Management and Medication Current Pain Management: Electronic Signature(s) Signed: 11/13/2017 5:54:46 PM By: Montey Hora Entered By: Montey Hora on 11/13/2017 10:05:43 [name] ([id number]) -------------------------------------------------------------------------------- Patient/Caregiver Education Details Patient Name: [name]. Date of Service: 11/13/2017 9:45 AM Medical Record Number: [id number] Patient Account Number: [id number] Date of Birth/Gender: [birth year] (72 y.o. Male) Treating RN: Cornell Barman Primary Care Physician: Elsie Stain Other Clinician: Referring Physician: Elsie Stain Treating Physician/Extender: Sharalyn Ink in Treatment: 0 Education Assessment Education Provided To: Patient Education Topics Provided Welcome [facility]: Handouts: [facility] Methods: Explain/Verbal Responses: State content correctly Wound/Skin Impairment: Methods: Explain/Verbal Responses: State content correctly Electronic Signature(s) Signed: 11/16/2017 6:17:48 PM By: Gretta Cool, BSN, RN, CWS, Kim RN, BSN Entered By: Gretta Cool, BSN, RN, CWS, Kim on 11/13/2017 11:37:22 [name]  ([id number]) -------------------------------------------------------------------------------- Wound Assessment Details Patient Name: JOSPEH, MANGEL. Date of Service: 11/13/2017 9:45 AM Medical Record Number: [id number] Patient Account Number: [id number] Date of Birth/Sex: 01/14/1946 (72 y.o. Male) Treating RN: Montey Hora Primary Care Lonnel Gjerde: Elsie Stain Other Clinician: Referring Hudson Lehmkuhl: Elsie Stain Treating Darleny Sem/Extender: Melburn Hake, HOYT Weeks in Treatment: 0 Wound Status Wound Number: 1 Primary Trauma, Other Etiology: Wound Location: Left Lower Leg - Medial, Proximal Wound Status: Open Wounding Event: Trauma Comorbid Cataracts, Sleep Apnea, Hypertension, Date Acquired: 11/13/2016 History: Osteoarthritis Weeks Of Treatment: 0 Clustered Wound: No Photos Photo Uploaded By: Montey Hora on 11/13/2017 11:22:12 Wound Measurements Length: (cm) 0.1 Width: (cm) 0.1 Depth: (cm) 0.1 Area: (cm) 0.008 Volume: (cm) 0.001 % Reduction in Area: % Reduction in Volume: Epithelialization: None Tunneling: No Undermining: No Wound Description Full Thickness With Exposed Support Classification: Structures Wound Margin: Flat and Intact Exudate Medium Amount: Exudate Type: Purulent Exudate Color: yellow, brown, green Foul Odor After Cleansing: No Slough/Fibrino Yes Wound Bed Granulation Amount: None Present (0%) Exposed Structure Necrotic Amount: Large (67-100%) Fascia Exposed: No Necrotic Quality: Adherent Slough Fat Layer (Subcutaneous Tissue) Exposed: No Tendon Exposed: No Muscle Exposed: No Joint Exposed: No Bone Exposed: Yes Collison, Kazuma K. ([id number]) Periwound Skin Texture Texture Color No Abnormalities Noted: No No Abnormalities Noted: No Callus: No Atrophie Blanche: No Crepitus: No Cyanosis: No Excoriation: No Ecchymosis: No Induration: No Erythema: No Rash: No Hemosiderin Staining: No Scarring: Yes Mottled: No Pallor:  No Moisture Rubor: No No Abnormalities Noted: No Dry / Scaly: No Temperature / Pain Maceration: No Temperature: No Abnormality Wound Preparation Ulcer Cleansing: Rinsed/Irrigated with Saline Topical Anesthetic Applied: Other: lidocaine 4%, Electronic Signature(s) Signed: 11/13/2017 5:54:46 PM By: Montey Hora Entered By: Montey Hora on 11/13/2017 10:26:01 [name] ([id number]) -------------------------------------------------------------------------------- Wound Assessment Details Patient Name: [name]. Date of Service: 11/13/2017 9:45 AM Medical Record Number: [id number] Patient Account Number: [id number] Date of Birth/Sex: Oct 07, 1945 (72 y.o. Male) Treating  RN: Montey Hora Primary Care Labrian Torregrossa: Elsie Stain Other Clinician: Referring Kavon Valenza: Elsie Stain Treating Bane Hagy/Extender: Melburn Hake, HOYT Weeks in Treatment: 0 Wound Status Wound Number: 2 Primary Trauma, Other Etiology: Wound Location: Left Lower Leg - Medial Wound Status: Open Wounding Event: Trauma Comorbid Cataracts, Sleep Apnea, Hypertension, Date Acquired: 11/13/2016 History: Osteoarthritis Weeks Of Treatment: 0 Clustered Wound: No Pending Amputation On Presentation Photos Photo Uploaded By: Montey Hora on 11/13/2017 11:22:37 Wound Measurements Length: (cm) 1.4 Width: (cm) 0.3 Depth: (cm) 0.3 Area: (cm) 0.33 Volume: (cm) 0.099 % Reduction in Area: % Reduction in Volume: Epithelialization: None Tunneling: No Undermining: Yes Starting Position (o'clock): 2 Ending Position (o'clock): 7 Maximum Distance: (cm) 0.6 Wound Description Full Thickness With Exposed Support Classification: Structures Wound Margin: Flat and Intact Exudate Medium Amount: Exudate Type: Purulent Exudate Color: yellow, brown, green Foul Odor After Cleansing: No Slough/Fibrino Yes Wound Bed Granulation Amount: None Present (0%) Exposed Structure Necrotic Amount: Large  (67-100%) Fascia Exposed: No Burry, Raven K. ([id number]) Necrotic Quality: Adherent Slough Fat Layer (Subcutaneous Tissue) Exposed: No Tendon Exposed: No Muscle Exposed: No Joint Exposed: No Bone Exposed: Yes Periwound Skin Texture Texture Color No Abnormalities Noted: No No Abnormalities Noted: No Callus: No Atrophie Blanche: No Crepitus: No Cyanosis: No Excoriation: No Ecchymosis: No Induration: No Erythema: No Rash: No Hemosiderin Staining: No Scarring: Yes Mottled: No Pallor: No Moisture Rubor: No No Abnormalities Noted: No Dry / Scaly: No Temperature / Pain Maceration: No Temperature: No Abnormality Wound Preparation Ulcer Cleansing: Rinsed/Irrigated with Saline Topical Anesthetic Applied: Other: lidocaine 4%, Electronic Signature(s) Signed: 11/13/2017 5:54:46 PM By: Montey Hora Entered By: Montey Hora on 11/13/2017 10:27:44 [name] ([id number]) -------------------------------------------------------------------------------- Wound Assessment Details Patient Name: [name]. Date of Service: 11/13/2017 9:45 AM Medical Record Number: [id number] Patient Account Number: [id number] Date of Birth/Sex: 1946-02-02 (72 y.o. Male) Treating RN: Montey Hora Primary Care Payden Bonus: Elsie Stain Other Clinician: Referring Lochlin Eppinger: Elsie Stain Treating Betha Shadix/Extender: Melburn Hake, HOYT Weeks in Treatment: 0 Wound Status Wound Number: 3 Primary Trauma, Other Etiology: Wound Location: Left Lower Leg - Medial, Distal Wound Status: Open Wounding Event: Trauma Comorbid Cataracts, Sleep Apnea, Hypertension, Date Acquired: 11/13/2016 History: Osteoarthritis Weeks Of Treatment: 0 Clustered Wound: No Pending Amputation On Presentation Photos Photo Uploaded By: Montey Hora on 11/13/2017 11:22:38 Wound Measurements Length: (cm) 0.2 Width: (cm) 0.1 Depth: (cm) 0.2 Area: (cm) 0.016 Volume: (cm) 0.003 % Reduction in Area: %  Reduction in Volume: Epithelialization: None Tunneling: No Undermining: No Wound Description Full Thickness With Exposed Support Classification: Structures Wound Margin: Flat and Intact Exudate Small Amount: Exudate Type: Purulent Exudate Color: yellow, brown, green Foul Odor After Cleansing: No Slough/Fibrino Yes Wound Bed Granulation Amount: None Present (0%) Exposed Structure Necrotic Amount: Large (67-100%) Fascia Exposed: No Necrotic Quality: Adherent Slough Fat Layer (Subcutaneous Tissue) Exposed: No Tendon Exposed: No Muscle Exposed: No Joint Exposed: No Risden, Cori K. ([id number]) Bone Exposed: Yes Periwound Skin Texture Texture Color No Abnormalities Noted: No No Abnormalities Noted: No Callus: No Atrophie Blanche: No Crepitus: No Cyanosis: No Excoriation: No Ecchymosis: No Induration: No Erythema: No Rash: No Hemosiderin Staining: No Scarring: Yes Mottled: No Pallor: No Moisture Rubor: No No Abnormalities Noted: No Dry / Scaly: No Temperature / Pain Maceration: No Temperature: No Abnormality Wound Preparation Ulcer Cleansing: Rinsed/Irrigated with Saline Topical Anesthetic Applied: Other: lidocaine 4%, Electronic Signature(s) Signed: 11/13/2017 5:54:46 PM By: Montey Hora Entered By: Montey Hora on 11/13/2017 10:29:05 [name] ([id number]) -------------------------------------------------------------------------------- [place] Details Patient Name: [name].  Date of Service: 11/13/2017 9:45 AM Medical Record Number: [id number] Patient Account Number: [id number] Date of Birth/Sex: 12-27-1945 (72 y.o. Male) Treating RN: Montey Hora Primary Care Davide Risdon: Elsie Stain Other Clinician: Referring Cariah Salatino: Elsie Stain Treating Sri Clegg/Extender: Melburn Hake, HOYT Weeks in Treatment: 0 Vital Signs Time Taken: 10:15 Temperature (F): 98.2 Height (in): 74 Pulse (bpm): 79 Source: Measured Respiratory Rate  (breaths/min): 16 Weight (lbs): 220 Blood Pressure (mmHg): 129/90 Source: Measured Reference Range: 80 - 120 mg / dl Body Mass Index (BMI): 28.2 Electronic Signature(s) Signed: 11/13/2017 5:54:46 PM By: Montey Hora Entered By: Montey Hora on 11/13/2017 10:15:38

## 2017-11-29 NOTE — Progress Notes (Signed)
Tyler, Hayden ([id number]) Visit Report for 11/19/2017 Chief Complaint Document Details Patient Name: [name], [name]. Date of Service: 11/19/2017 10:15 AM Medical Record Number: [id number] Patient Account Number: [id number] Date of Birth/Sex: [birth year] (72 y.o. Male) Treating RN: Roger Shelter Primary Care Provider: Elsie Stain Other Clinician: Referring Provider: Elsie Stain Treating Provider/Extender: Melburn Hake, HOYT Weeks in Treatment: 0 Information Obtained from: Patient Chief Complaint Right leg ulcer Electronic Signature(s) Signed: 11/19/2017 5:57:26 PM By: Worthy Keeler PA-C Entered By: Worthy Keeler on 11/19/2017 10:14:24 [name] ([id number]) -------------------------------------------------------------------------------- HPI Details Patient Name: [name] Date of Service: 11/19/2017 10:15 AM Medical Record Number: [id number] Patient Account Number: [id number] Date of Birth/Sex: [birth year] (72 y.o. Male) Treating RN: Roger Shelter Primary Care Provider: Elsie Stain Other Clinician: Referring Provider: Elsie Stain Treating Provider/Extender: Melburn Hake, HOYT Weeks in Treatment: 0 History of Present Illness HPI Description: 11/13/17 patient presents today for initial evaluation and our clinic due to an issue he is having with his left lower extremity in the anterior portion. In 1955 he had a significant accident where he fractured his leg and subsequently had the had reconstructive surgery in this location. Nonetheless he states he has done fairly well over the years that time without any complication speak of. Subsequently when year ago he had some trauma potentially occurred to the site which led to an opening that really has not closed since that time. He really does not know what the trauma was in fact he tells me that he doesn't remember anything in particular it is possible this might've been something that just opened up on its  own he really does not know in the end. Nonetheless he's not really had any discomfort or pain although it does appear that there is bone exposed base of the wound. Being on the anterior shin overlying the tibia and with the thin tissue in this area anyway this is always going to be a difficult place to deal. The ulcer itself seems to have opened up along the surgical incision site. 11/19/17 on evaluation today I did have patient's culture available for review and it did appear that his culture was negative for any bacteria just normal skin flora was noted. This makes me think that the fragment of bone which was lifting off was actually not infected but rather just flaking because the bone was to dry with the dressings that were place. Nonetheless I do think he's doing much better today the bone seems to be staying much more moist and I think is better quality overall. In general I'm very happy with the overall appearance of the wound today compared to previous there's little bit of erythema there may be just slight cellulitis surrounding the wound bed this was discussed with the patient. He does have doxycycline 100 mg on hand already which I think he can take and he is going to go ahead and start doing this. His MRI is scheduled for the 10th of this month and five days. Electronic Signature(s) Signed: 11/19/2017 5:57:26 PM By: Worthy Keeler PA-C Entered By: Worthy Keeler on 11/19/2017 13:12:40 [name] ([id number]) -------------------------------------------------------------------------------- Physical Exam Details Patient Name: Hayden, [name]. Date of Service: 11/19/2017 10:15 AM Medical Record Number: [id number] Patient Account Number: [id number] Date of Birth/Sex: December 11, 1945 (72 y.o. Male) Treating RN: Roger Shelter Primary Care Provider: Elsie Stain Other Clinician: Referring Provider: Elsie Stain Treating Provider/Extender: STONE III, HOYT Weeks in Treatment:  0 Constitutional Well-nourished and well-hydrated in no  acute distress. Respiratory normal breathing without difficulty. clear to auscultation bilaterally. Cardiovascular regular rate and rhythm with normal S1, S2. Psychiatric this patient is able to make decisions and demonstrates good insight into disease process. Alert and Oriented x 3. pleasant and cooperative. Notes Patient's wound actually seems to be doing fairly well several of the smaller areas have simile closed up one still remains. Nonetheless he does still have bone noted in the main central opening at this time. There does not appear to be any evidence of severe infection which is good news. There may be a little surrounding cellulitis this was discussed with the patient. Electronic Signature(s) Signed: 11/19/2017 5:57:26 PM By: Worthy Keeler PA-C Entered By: Worthy Keeler on 11/19/2017 13:13:23 [name] ([id number]) -------------------------------------------------------------------------------- Physician Orders Details Patient Name: [name] Date of Service: 11/19/2017 10:15 AM Medical Record Number: [id number] Patient Account Number: [id number] Date of Birth/Sex: 12-21-1945 (72 y.o. 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 Wound #3 Left,Distal,Medial Lower Leg o Clean wound with Normal Saline. Anesthetic (add to Medication List) Wound #2 Left,Medial Lower Leg o Topical Lidocaine 4% cream  applied to wound bed prior to debridement (In Clinic Only). Wound #3 Left,Distal,Medial Lower Leg o Topical Lidocaine 4% cream applied to wound bed prior to debridement (In Clinic Only). Skin Barriers/Peri-Wound Care Wound #2 Left,Medial Lower Leg o Skin Prep Wound #3 Left,Distal,Medial Lower Leg o Skin Prep Primary Wound Dressing Wound #2 Left,Medial Lower Leg o Xeroform - with hydrogel Wound #3 Left,Distal,Medial Lower Leg o Xeroform - with hydrogel Secondary Dressing Wound #2 Left,Medial Lower Leg o Boardered Foam Dressing Wound #3 Left,Distal,Medial Lower Leg o Boardered Foam Dressing Dressing Change Frequency Heisler, Juanmiguel K. ([id number]) Wound #2 Left,Medial Lower Leg o Three times weekly Wound #3 Left,Distal,Medial Lower Leg o Three times weekly Follow-up Appointments Wound #2 Left,Medial Lower Leg o Return Appointment in 2 weeks. Wound #3 Left,Distal,Medial Lower Leg o Return Appointment in 2 weeks. Edema Control Wound #2 Left,Medial Lower Leg o Elevate legs to the level of the heart and pump ankles as often as possible Wound #3 Left,Distal,Medial Lower Leg o Elevate legs to the level of the heart and pump ankles as often as possible Additional Orders / Instructions Wound #2 Left,Medial Lower Leg o Vitamin A; Vitamin C, Zinc - Multivitamin ok Wound #3 Left,Distal,Medial Lower Leg o Vitamin A; Vitamin C, Zinc - Multivitamin ok Medications-please add to medication list. o Other: - complete doxycycline 100mg  tab twice daily Radiology o Magnetic Resonance Imaging (MRI) - follow with appointment Electronic Signature(s) Signed: 11/19/2017 5:57:26 PM By: Worthy Keeler PA-C Signed: 11/20/2017 8:47:36 AM By: Roger Shelter Entered By: Roger Shelter on 11/19/2017 10:44:22 Profit, [name] ([id number]) -------------------------------------------------------------------------------- Problem List Details Patient Name: [name]. Date of Service: 11/19/2017 10:15 AM Medical Record Number: [id number] Patient Account Number: [id number] Date of Birth/Sex: [birth year] (72 y.o. Male) Treating RN: Roger Shelter Primary Care Provider: Elsie Stain Other Clinician: Referring Provider: Elsie Stain Treating Provider/Extender: Melburn Hake, HOYT Weeks in Treatment: 0 Active Problems ICD-10 Evaluated Encounter Code Description Active Date Today Diagnosis  P50.932I Unspecified open wound, right lower leg, initial encounter 11/13/2017 No Yes L97.816 Non-pressure chronic ulcer of other part of right lower leg 11/13/2017 No Yes with bone involvement without evidence of necrosis I10 Essential (primary) hypertension 11/13/2017 No Yes Inactive Problems Resolved Problems Electronic Signature(s) Signed: 11/19/2017 5:57:26 PM By: Worthy Keeler PA-C Entered By: Worthy Keeler on 11/19/2017 13:10:27 [name] ([id number]) -------------------------------------------------------------------------------- Progress Note Details Patient Name: [name]. Date of Service: 11/19/2017 10:15 AM Medical Record Number: [id number] Patient Account Number: [id number] Date of Birth/Sex: 08-Sep-1945 (72 y.o. Male) Treating RN: Roger Shelter Primary Care Provider: Elsie Stain Other Clinician: Referring Provider: Elsie Stain Treating Provider/Extender: Melburn Hake, HOYT Weeks in Treatment: 0 Subjective Chief Complaint Information obtained from Patient Right leg ulcer History of Present Illness (HPI) 11/13/17 patient presents today for initial evaluation and our clinic due to an issue he is having with his left lower extremity in the anterior portion. In 1955 he had a significant accident where he fractured his leg and subsequently had the had reconstructive surgery in this location. Nonetheless he states he has done fairly well over the years that time without any complication speak of. Subsequently when year ago he  had some trauma potentially occurred to the site which led to an opening that really has not closed since that time. He really does not know what the trauma was in fact he tells me that he doesn't remember anything in particular it is possible this might've been something that just opened up on its own he really does not know in the end. Nonetheless he's not really had any discomfort or pain although it does appear that there is bone exposed base of the wound. Being on the anterior shin overlying the tibia and with the thin tissue in this area anyway this is always going to be a difficult place to deal. The ulcer itself seems to have opened up along the surgical incision site. 11/19/17 on evaluation today I did have patient's culture available for review and it did appear that his culture was negative for any bacteria just normal skin flora was noted. This makes me think that the fragment of bone which was lifting off was actually not infected but rather just flaking because the bone was to dry with the dressings that were place. Nonetheless I do think he's doing much better today the bone seems to be staying much more moist and I think is better quality overall. In general I'm very happy with the overall appearance of the wound today compared to previous there's little bit of erythema there may be just slight cellulitis surrounding the wound bed this was discussed with the patient. He does have doxycycline 100 mg on hand already which I think he can take and he is going to go ahead and start doing this. His MRI is scheduled for the 10th of this month and five days. Patient History Information obtained from Patient. Family History Diabetes - Father, Heart Disease - Paternal Grandparents, Hypertension - Mother, Lung Disease - Mother, No family history of Cancer, Hereditary Spherocytosis, Kidney Disease, Seizures, Stroke, Thyroid Problems, Tuberculosis. Social History Never smoker, Marital Status -  Married, Alcohol Use - Never, Drug Use - No History, Caffeine Use - Moderate. 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 Respiratory The patient has no complaints or symptoms. BRENNIN, DURFEE ([id number]) Cardiovascular The patient has no  complaints or symptoms. Psychiatric The patient has no complaints or symptoms. Objective Constitutional Well-nourished and well-hydrated in no acute distress. Vitals Time Taken: 10:14 AM, Height: 74 in, Weight: 220 lbs, BMI: 28.2, Temperature: 97.7 F, Pulse: 77 bpm, Respiratory Rate: 18 breaths/min, Blood Pressure: 137/75 mmHg. Respiratory normal breathing without difficulty. clear to auscultation bilaterally. Cardiovascular regular rate and rhythm with normal S1, S2. Psychiatric this patient is able to make decisions and demonstrates good insight into disease process. Alert and Oriented x 3. pleasant and cooperative. General Notes: Patient's wound actually seems to be doing fairly well several of the smaller areas have simile closed up one still remains. Nonetheless he does still have bone noted in the main central opening at this time. There does not appear to be any evidence of severe infection which is good news. There may be a little surrounding cellulitis this was discussed with the patient. Integumentary (Hair, Skin) Wound #1 status is Healed - Epithelialized. Original cause of wound was Trauma. The wound is located on the Left,Proximal,Medial Lower Leg. The wound measures 0cm length x 0cm width x 0cm depth; 0cm^2 area and 0cm^3 volume. There is bone exposed. There is no tunneling noted. There is a none present amount of drainage noted. The wound margin is flat and intact. There is no granulation within the wound bed. There is a large (67-100%) amount of necrotic tissue within  the wound bed including Adherent Slough. The periwound skin appearance exhibited: Scarring. The periwound skin appearance did not exhibit: Callus, Crepitus, Excoriation, Induration, Rash, Dry/Scaly, Maceration, Atrophie Blanche, Cyanosis, Ecchymosis, Hemosiderin Staining, Mottled, Pallor, Rubor, Erythema. Periwound temperature was noted as No Abnormality. Wound #2 status is Open. Original cause of wound was Trauma. The wound is located on the Left,Medial Lower Leg. The wound measures 1.3cm length x 0.3cm width x 0.2cm depth; 0.306cm^2 area and 0.061cm^3 volume. There is bone exposed. There is no tunneling or undermining noted. There is a medium amount of purulent drainage noted. The wound margin is flat and intact. There is no granulation within the wound bed. There is a large (67-100%) amount of necrotic tissue within the wound bed including Adherent Slough. The periwound skin appearance exhibited: Scarring. The periwound skin appearance did not exhibit: Callus, Crepitus, Excoriation, Induration, Rash, Dry/Scaly, Maceration, Atrophie Blanche, Cyanosis, Ecchymosis, Hemosiderin Staining, Mottled, Pallor, Rubor, Erythema. Periwound temperature was noted as No Abnormality. Wound #3 status is Open. Original cause of wound was Trauma. The wound is located on the Left,Distal,Medial Lower Leg. The wound measures 0.2cm length x 0.1cm width x 0.1cm depth; 0.016cm^2 area and 0.002cm^3 volume. There is bone exposed. There is no tunneling or undermining noted. There is a none present amount of drainage noted. The wound margin Mayon, Miranda K. ([id number]) is flat and intact. There is no granulation within the wound bed. There is a large (67-100%) amount of necrotic tissue within the wound bed including Adherent Slough. The periwound skin appearance exhibited: Scarring. The periwound skin appearance did not exhibit: Callus, Crepitus, Excoriation, Induration, Rash, Dry/Scaly, Maceration, Atrophie  Blanche, Cyanosis, Ecchymosis, Hemosiderin Staining, Mottled, Pallor, Rubor, Erythema. Periwound temperature was noted as No Abnormality. 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 Wound #3 Left,Distal,Medial Lower Leg: Clean wound with Normal Saline. Anesthetic (add to Medication List): Wound #2 Left,Medial Lower Leg:  Topical Lidocaine 4% cream applied to wound bed prior to debridement (In Clinic Only). Wound #3 Left,Distal,Medial Lower Leg: Topical Lidocaine 4% cream applied to wound bed prior to debridement (In Clinic Only). Skin Barriers/Peri-Wound Care: Wound #2 Left,Medial Lower Leg: Skin Prep Wound #3 Left,Distal,Medial Lower Leg: Skin Prep Primary Wound Dressing: Wound #2 Left,Medial Lower Leg: Xeroform - with hydrogel Wound #3 Left,Distal,Medial Lower Leg: Xeroform - with hydrogel Secondary Dressing: Wound #2 Left,Medial Lower Leg: Boardered Foam Dressing Wound #3 Left,Distal,Medial Lower Leg: Boardered Foam Dressing Dressing Change Frequency: Wound #2 Left,Medial Lower Leg: Three times weekly Wound #3 Left,Distal,Medial Lower Leg: Three times weekly Follow-up Appointments: Wound #2 Left,Medial Lower Leg: Return Appointment in 2 weeks. CHESKY, HEYER ([id number]) Wound #3 Left,Distal,Medial Lower Leg: Return Appointment in 2 weeks. Edema Control: Wound #2 Left,Medial Lower Leg: Elevate legs to the level of the heart and pump ankles as often as possible Wound #3 Left,Distal,Medial Lower Leg: Elevate legs to the level of the heart and pump ankles as often as possible Additional Orders / Instructions: Wound #2 Left,Medial Lower Leg: Vitamin A; Vitamin C, Zinc - Multivitamin  ok Wound #3 Left,Distal,Medial Lower Leg: Vitamin A; Vitamin C, Zinc - Multivitamin ok Medications-please add to medication list.: Other: - complete doxycycline 100mg  tab twice daily Radiology ordered were: Magnetic Resonance Imaging (MRI) - follow with appointment My suggestion at this point in time is going to be that we actually go ahead and continue to manage the wound as we have at this time. He's in agreement with that plan. Subsequently we are gonna wait and see what the MRI shows on the 10th. If as I hope this appears to be normal and there's no evidence of chronic osteomyelitis my suggestion would be that we proceed with [employer] application which we are gonna work on gaining approval for as well. The patient is in agreement with this plan. We will subsequently see were things stand at follow-up in two weeks time. Please see above for specific wound care orders. We will see patient for re-evaluation in 1 week(s) here in the clinic. If anything worsens or changes patient will contact our office for additional recommendations. Electronic Signature(s) Signed: 11/19/2017 5:57:26 PM By: Worthy Keeler PA-C Entered By: Worthy Keeler on 11/19/2017 13:15:39 [name] ([id number]) -------------------------------------------------------------------------------- ROS/PFSH Details Patient Name: [name] Date of Service: 11/19/2017 10:15 AM Medical Record Number: [id number] Patient Account Number: [id number] Date of Birth/Sex: 1945-11-30 (72 y.o. Male) Treating RN: Roger Shelter Primary Care Provider: Elsie Stain Other Clinician: Referring Provider: Elsie Stain Treating Provider/Extender: Melburn Hake, HOYT Weeks in Treatment: 0 Information Obtained From Patient Wound History Do you currently have one or more open woundso Yes How many open wounds do you currently haveo 1 Approximately how long have you had your woundso 1 year How have you been treating your  wound(s) until nowo nuskin Has your wound(s) ever healed and then re-openedo No Have you had any lab work done in the past montho No Have you tested positive for an antibiotic resistant organism (MRSA, VRE)o No Have you tested positive for osteomyelitis (bone infection)o No Have you had any tests for circulation on your legso No Constitutional Symptoms (General Health) Complaints and Symptoms: Negative for: Fever; Chills Eyes Medical History: Positive for: Cataracts - removed Negative for: Glaucoma; Optic Neuritis Ear/Nose/Mouth/Throat Medical History: Negative for: Chronic sinus problems/congestion; Middle ear problems Hematologic/Lymphatic Medical History: Negative for: Anemia; Hemophilia; Human Immunodeficiency Virus; Lymphedema; Sickle Cell Disease Respiratory Complaints and Symptoms: No  Complaints or Symptoms Medical History: Positive for: Sleep Apnea - CPAP Negative for: Aspiration; Asthma; Chronic Obstructive Pulmonary Disease (COPD); Pneumothorax; Tuberculosis Cardiovascular Complaints and Symptoms: No Complaints or Symptoms Medical History: Positive for: Hypertension Caporaso, Anubis K. ([id number]) Negative for: Angina; Arrhythmia; Congestive Heart Failure; Coronary Artery Disease; Deep Vein Thrombosis; Hypotension; Myocardial Infarction; Peripheral Arterial Disease; Peripheral Venous Disease; Phlebitis; Vasculitis Past Medical History Notes: heart murmur Gastrointestinal Medical History: Negative for: Cirrhosis ; Colitis; Crohnos; Hepatitis A; Hepatitis B; Hepatitis C Endocrine Medical History: Negative for: Type I Diabetes; Type II Diabetes Genitourinary Medical History: Negative for: End Stage Renal Disease Past Medical History Notes: HSV Immunological Medical History: Negative for: Lupus Erythematosus; Raynaudos; Scleroderma Integumentary (Skin) Medical History: Negative for: History of Burn; History of pressure wounds Musculoskeletal Medical  History: Positive for: Osteoarthritis Negative for: Gout; Rheumatoid Arthritis Neurologic Medical History: Negative for: Dementia; Neuropathy; Quadriplegia; Paraplegia; Seizure Disorder Oncologic Medical History: Negative for: Received Chemotherapy; Received Radiation Psychiatric Complaints and Symptoms: No Complaints or Symptoms Medical History: Negative for: Anorexia/bulimia; Confinement Anxiety HBO Extended History Items Eyes: Cataracts Robb, Ghali K. ([id number]) Immunizations Pneumococcal Vaccine: Received Pneumococcal Vaccination: Yes Immunization Notes: up to date Implantable Devices Family and Social History Cancer: No; Diabetes: Yes - Father; Heart Disease: Yes - Paternal Grandparents; Hereditary Spherocytosis: No; Hypertension: Yes - Mother; Kidney Disease: No; Lung Disease: Yes - Mother; Seizures: No; Stroke: No; Thyroid Problems: No; Tuberculosis: No; Never smoker; Marital Status - Married; Alcohol Use: Never; Drug Use: No History; Caffeine Use: Moderate; Financial Concerns: No; Food, Clothing or Shelter Needs: No; Support System Lacking: No; Transportation Concerns: No; Advanced Directives: Yes (Not Provided); Patient does not want information on Advanced Directives; Medical Power of Attorney: Yes - Tammy Rezas (Not Provided) Physician Affirmation I have reviewed and agree with the above information. Electronic Signature(s) Signed: 11/19/2017 5:57:26 PM By: Worthy Keeler PA-C Signed: 11/20/2017 8:47:36 AM By: Roger Shelter Entered By: Worthy Keeler on 11/19/2017 13:12:58 Bhargava, [name] ([id number]) -------------------------------------------------------------------------------- SuperBill Details Patient Name: [name]. Date of Service: 11/19/2017 Medical Record Number: [id number] Patient Account Number: [id number] Date of Birth/Sex: 1945-09-11 (72 y.o. 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 I10 Essential (primary) hypertension Facility Procedures CPT4 Code: [id number] Description: [phone] - WOUND CARE VISIT-LEV 2 EST PT Modifier: Quantity: 1 Physician Procedures CPT4 Code Description: [id number] - WC PHYS LEVEL 4 - EST PT ICD-10 Diagnosis Description S81.801A Unspecified open wound, right lower leg, initial encounter L97.814 Non-pressure chronic ulcer of other part of right lower leg wi I10 Essential  (primary) hypertension Modifier: th necrosis of bo Quantity: 1 ne Electronic Signature(s) Signed: 11/19/2017 5:57:26 PM By: Worthy Keeler PA-C Entered By: Worthy Keeler on 11/19/2017 13:16:03

## 2017-11-30 NOTE — Progress Notes (Signed)
DEMBA, NIGH ([id number]) Visit Report for 11/19/2017 Arrival Information Details Patient Name: [name], [name]. Date of Service: 11/19/2017 10:15 AM Medical Record Number: [id number] Patient Account Number: [id number] Date of Birth/Sex: Jul 21, 1945 (72 y.o. Male) Treating RN: Ahmed Prima Primary Care Deandrew Hoecker: Elsie Stain Other Clinician: Referring Mio Schellinger: Elsie Stain Treating Tandy Grawe/Extender: Melburn Hake, HOYT Weeks in Treatment: 0 Visit Information History Since Last Visit All ordered tests and consults were completed: No Patient Arrived: Ambulatory Added or deleted any medications: No Arrival Time: 10:05 Any new allergies or adverse reactions: No Accompanied By: wife Had a fall or experienced change in No Transfer Assistance: None activities of daily living that may affect Patient Identification Verified: Yes risk of falls: Secondary Verification Process Yes Signs or symptoms of abuse/neglect since last visito No Completed: Hospitalized since last visit: No Patient Requires Transmission-Based No Implantable device outside of the clinic excluding No Precautions: cellular tissue based products placed in the center Patient Has Alerts: Yes since last visit: Patient Alerts: ABI [place] BILATERAL Has Dressing in Place as Prescribed: Yes >220 Pain Present Now: No Electronic Signature(s) Signed: 11/19/2017 4:59:33 PM By: Alric Quan Entered By: Alric Quan on 11/19/2017 10:07:47 [name] ([id number]) -------------------------------------------------------------------------------- Clinic Level of Care Assessment Details Patient Name: [name]. Date of Service: 11/19/2017 10:15 AM Medical Record Number: [id number] Patient Account Number: [id number] Date of Birth/Sex: [birth year] (72 y.o. Male) Treating RN: Roger Shelter Primary Care Micheala Morissette: Elsie Stain Other Clinician: Referring Saajan Willmon: Elsie Stain Treating Yoneko Talerico/Extender:  Melburn Hake, HOYT Weeks in Treatment: 0 Clinic Level of Care Assessment Items TOOL 4 Quantity Score X - Use when only an EandM is performed on FOLLOW-UP visit 1 0 ASSESSMENTS - Nursing Assessment / Reassessment X - Reassessment of Co-morbidities (includes updates in patient status) 1 10 X- 1 5 Reassessment of Adherence to Treatment Plan ASSESSMENTS - Wound and Skin Assessment / Reassessment X - Simple Wound Assessment / Reassessment - one wound 1 5 []  - 0 Complex Wound Assessment / Reassessment - multiple wounds []  - 0 Dermatologic / Skin Assessment (not related to wound area) ASSESSMENTS - Focused Assessment []  - Circumferential Edema Measurements - multi extremities 0 []  - 0 Nutritional Assessment / Counseling / Intervention []  - 0 Lower Extremity Assessment (monofilament, tuning fork, pulses) []  - 0 Peripheral Arterial Disease Assessment (using hand held doppler) ASSESSMENTS - Ostomy and/or Continence Assessment and Care []  - Incontinence Assessment and Management 0 []  - 0 Ostomy Care Assessment and Management (repouching, etc.) PROCESS - Coordination of Care X - Simple Patient / Family Education for ongoing care 1 15 []  - 0 Complex (extensive) Patient / Family Education for ongoing care []  - 0 Staff obtains Programmer, systems, Records, Test Results / Process Orders []  - 0 Staff telephones HHA, Nursing Homes / Clarify orders / etc []  - 0 Routine Transfer to another Facility (non-emergent condition) []  - 0 Routine Hospital Admission (non-emergent condition) []  - 0 New Admissions / Biomedical engineer / Ordering NPWT, Apligraf, etc. []  - 0 Emergency Hospital Admission (emergent condition) X- 1 10 Simple Discharge Coordination TREMELL, REIMERS. ([id number]) []  - 0 Complex (extensive) Discharge Coordination PROCESS - Special Needs []  - Pediatric / Minor Patient Management 0 []  - 0 Isolation Patient Management []  - 0 Hearing / Language / Visual special needs []  -  0 Assessment of Community assistance (transportation, D/C planning, etc.) []  - 0 Additional assistance / Altered mentation []  - 0 Support Surface(s) Assessment (bed, cushion, seat, etc.) INTERVENTIONS - Wound Cleansing / Measurement  X - Simple Wound Cleansing - one wound 1 5 []  - 0 Complex Wound Cleansing - multiple wounds X- 1 5 Wound Imaging (photographs - any number of wounds) []  - 0 Wound Tracing (instead of photographs) X- 1 5 Simple Wound Measurement - one wound []  - 0 Complex Wound Measurement - multiple wounds INTERVENTIONS - Wound Dressings X - Small Wound Dressing one or multiple wounds 1 10 []  - 0 Medium Wound Dressing one or multiple wounds []  - 0 Large Wound Dressing one or multiple wounds []  - 0 Application of Medications - topical []  - 0 Application of Medications - injection INTERVENTIONS - Miscellaneous []  - External ear exam 0 []  - 0 Specimen Collection (cultures, biopsies, blood, body fluids, etc.) []  - 0 Specimen(s) / Culture(s) sent or taken to Lab for analysis []  - 0 Patient Transfer (multiple staff / Civil Service fast streamer / Similar devices) []  - 0 Simple Staple / Suture removal (25 or less) []  - 0 Complex Staple / Suture removal (26 or more) []  - 0 Hypo / Hyperglycemic Management (close monitor of Blood Glucose) []  - 0 Ankle / Brachial Index (ABI) - do not check if billed separately X- 1 5 Vital Signs Edwards, Ayaansh K. ([id number]) Has the patient been seen at the hospital within the last three years: Yes Total Score: 75 Level Of Care: New/Established - Level 2 Electronic Signature(s) Signed: 11/20/2017 8:47:36 AM By: Roger Shelter Entered By: Roger Shelter on 11/19/2017 10:43:28 [name] ([id number]) -------------------------------------------------------------------------------- Encounter Discharge Information Details Patient Name: [name]. Date of Service: 11/19/2017 10:15 AM Medical Record Number: [id number] Patient  Account Number: [id number] Date of Birth/Sex: July 04, 1945 (72 y.o. Male) Treating RN: Montey Hora Primary Care Candyce Gambino: Elsie Stain Other Clinician: Referring Jossie Smoot: Elsie Stain Treating Litzi Binning/Extender: Melburn Hake, HOYT Weeks in Treatment: 0 Encounter Discharge Information Items Discharge Condition: Stable Ambulatory Status: Ambulatory Discharge Destination: Home Transportation: Private Auto Accompanied By: spouse Schedule Follow-up Appointment: Yes Clinical Summary of Care: Electronic Signature(s) Signed: 11/19/2017 10:57:26 AM By: Montey Hora Entered By: Montey Hora on 11/19/2017 10:57:25 [name] ([id number]) -------------------------------------------------------------------------------- Lower Extremity Assessment Details Patient Name: [name]. Date of Service: 11/19/2017 10:15 AM Medical Record Number: [id number] Patient Account Number: [id number] Date of Birth/Sex: 07/27/1945 (72 y.o. Male) Treating RN: Ahmed Prima Primary Care Bearl Talarico: Elsie Stain Other Clinician: Referring Miliana Gangwer: Elsie Stain Treating Leah Thornberry/Extender: Melburn Hake, HOYT Weeks in Treatment: 0 Vascular Assessment Pulses: Dorsalis Pedis Palpable: [Left:Yes] Posterior Tibial Extremity colors, hair growth, and conditions: Extremity Color: [Left:Mottled] Temperature of Extremity: [Left:Warm] Capillary Refill: [Left:< 3 seconds] Toe Nail Assessment Left: Right: Thick: No Discolored: No Deformed: Yes Electronic Signature(s) Signed: 11/19/2017 4:59:33 PM By: Alric Quan Entered By: Alric Quan on 11/19/2017 10:12:45 [name] ([id number]) -------------------------------------------------------------------------------- Multi Wound Chart Details Patient Name: [name]. Date of Service: 11/19/2017 10:15 AM Medical Record Number: [id number] Patient Account Number: [id number] Date of Birth/Sex: Feb 04, 1946 (72 y.o. Male) Treating RN:  Roger Shelter Primary Care Charlane Westry: Elsie Stain Other Clinician: Referring Javar Eshbach: Elsie Stain Treating Maquita Sandoval/Extender: Melburn Hake, HOYT Weeks in Treatment: 0 Vital Signs Height(in): 74 Pulse(bpm): 47 Weight(lbs): 220 Blood Pressure(mmHg): 137/75 Body Mass Index(BMI): 28 Temperature(F): 97.7 Respiratory Rate 18 (breaths/min): Photos: [1:No Photos] [2:No Photos] [3:No Photos] Wound Location: [1:Left, Proximal, Medial Lower Leg] [2:Left, Medial Lower Leg] [3:Left Lower Leg - Medial, Distal] Wounding Event: [1:Trauma] [2:Trauma] [3:Trauma] Primary Etiology: [1:Trauma, Other] [2:Trauma, Other] [3:Trauma, Other] Comorbid History: [1:Cataracts, Sleep Apnea, Hypertension, Osteoarthritis] [2:Cataracts, Sleep Apnea, Hypertension, Osteoarthritis] [3:Cataracts, Sleep Apnea,  Hypertension, Osteoarthritis] Date Acquired: [1:11/13/2016] [2:11/13/2016] [3:11/13/2016] Weeks of Treatment: [1:0] [2:0] [3:0] Wound Status: [1:Healed - Epithelialized] [2:Open] [3:Open] Pending Amputation on [1:Yes] [2:Yes] [3:Yes] Presentation: Measurements L x W x D [1:0x0x0] [2:1.3x0.3x0.2] [3:0.2x0.1x0.1] (cm) Area (cm) : [1:0] [2:0.306] [3:0.016] Volume (cm) : [1:0] [2:0.061] [3:0.002] % Reduction in Area: [1:100.00%] [2:7.30%] [3:0.00%] % Reduction in Volume: [1:100.00%] [2:38.40%] [3:33.30%] Classification: [1:Full Thickness With Exposed Support Structures] [2:Full Thickness With Exposed Support Structures] [3:Full Thickness With Exposed Support Structures] Exudate Amount: [1:None Present] [2:Medium] [3:None Present] Exudate Type: [1:N/A] [2:Purulent] [3:N/A] Exudate Color: [1:N/A] [2:yellow, brown, green] [3:N/A] Wound Margin: [1:Flat and Intact] [2:Flat and Intact] [3:Flat and Intact] Granulation Amount: [1:None Present (0%)] [2:None Present (0%)] [3:None Present (0%)] Necrotic Amount: [1:Large (67-100%)] [2:Large (67-100%)] [3:Large (67-100%)] Exposed Structures: [1:Bone: Yes Fascia: No Fat  Layer (Subcutaneous Tissue) Exposed: No Tendon: No Muscle: No Joint: No] [2:Bone: Yes Fascia: No Fat Layer (Subcutaneous Tissue) Exposed: No Tendon: No Muscle: No Joint: No] [3:Bone: Yes Fascia: No Fat Layer (Subcutaneous  Tissue) Exposed: No Tendon: No Muscle: No Joint: No] Epithelialization: [1:None] [2:None] [3:None] Periwound Skin Texture: [1:Scarring: Yes Excoriation: No Induration: No] [2:Scarring: Yes Excoriation: No Induration: No] [3:Scarring: Yes Excoriation: No Induration: No] Callus: No Callus: No Callus: No Crepitus: No Crepitus: No Crepitus: No Rash: No Rash: No Rash: No Periwound Skin Moisture: Maceration: No Maceration: No Maceration: No Dry/Scaly: No Dry/Scaly: No Dry/Scaly: No Periwound Skin Color: Atrophie Blanche: No Atrophie Blanche: No Atrophie Blanche: No Cyanosis: No Cyanosis: No Cyanosis: No Ecchymosis: No Ecchymosis: No Ecchymosis: No Erythema: No Erythema: No Erythema: No Hemosiderin Staining: No Hemosiderin Staining: No Hemosiderin Staining: No Mottled: No Mottled: No Mottled: No Pallor: No Pallor: No Pallor: No Rubor: No Rubor: No Rubor: No Temperature: No Abnormality No Abnormality No Abnormality Tenderness on Palpation: No No No Wound Preparation: Ulcer Cleansing: Ulcer Cleansing: Ulcer Cleansing: Rinsed/Irrigated with Saline Rinsed/Irrigated with Saline Rinsed/Irrigated with Saline Topical Anesthetic Applied: Topical Anesthetic Applied: Topical Anesthetic Applied: Other: lidocaine 4% Other: lidocaine 4% Other: lidocaine 4% Treatment Notes Electronic Signature(s) Signed: 11/20/2017 8:47:36 AM By: Roger Shelter Entered By: Roger Shelter on 11/19/2017 10:37:17 [name] ([id number]) -------------------------------------------------------------------------------- [place] Details Patient Name: RIAN, BUSCHE. Date of Service: 11/19/2017 10:15 AM Medical Record Number: [id number] Patient  Account Number: [id number] Date of Birth/Sex: 28-Sep-1945 (72 y.o. Male) Treating RN: Roger Shelter Primary Care Trestan Vahle: Elsie Stain Other Clinician: Referring Kais Monje: Elsie Stain Treating Pierra Skora/Extender: Melburn Hake, HOYT Weeks in Treatment: 0 Active Inactive ` Necrotic Tissue Nursing Diagnoses: Knowledge deficit related to management of necrotic/devitalized tissue Goals: Necrotic/devitalized tissue will be minimized in the wound bed Date Initiated: 11/13/2017 Target Resolution Date: 11/16/2017 Goal Status: Active Interventions: Provide education on necrotic tissue and debridement process Treatment Activities: Apply topical anesthetic as ordered : 11/13/2017 Notes: ` Orientation to the Wound Care Program Nursing Diagnoses: Knowledge deficit related to the wound healing center program Goals: Patient/caregiver will verbalize understanding of the [place] Program Date Initiated: 11/13/2017 Target Resolution Date: 11/16/2017 Goal Status: Active Interventions: Provide education on orientation to the wound center Notes: ` Wound/Skin Impairment Nursing Diagnoses: Impaired tissue integrity Goals: Ulcer/skin breakdown will have a volume reduction of 30% by week 4 Date Initiated: 11/13/2017 Target Resolution Date: 12/14/2017 SHADEN, LACHER ([id number]) Goal Status: Active Interventions: Assess patient/caregiver ability to obtain necessary supplies Treatment Activities: Skin care regimen initiated : 11/13/2017 Notes: Electronic Signature(s) Signed: 11/20/2017 8:47:36 AM By: Roger Shelter Entered By: Roger Shelter on 11/19/2017 10:37:04 [name] ([id number]) --------------------------------------------------------------------------------  Pain Assessment Details Patient Name: JAXIEL, KINES. Date of Service: 11/19/2017 10:15 AM Medical Record Number: [id number] Patient Account Number: [id number] Date of Birth/Sex: 05-Jan-1946 (72 y.o.  Male) Treating RN: Ahmed Prima Primary Care Sieanna Vanstone: Elsie Stain Other Clinician: Referring Keonta Monceaux: Elsie Stain Treating Sharmane Dame/Extender: Melburn Hake, HOYT Weeks in Treatment: 0 Active Problems Location of Pain Severity and Description of Pain Patient Has Paino No Site Locations Pain Management and Medication Current Pain Management: Electronic Signature(s) Signed: 11/19/2017 4:59:33 PM By: Alric Quan Entered By: Alric Quan on 11/19/2017 10:09:15 [name] ([id number]) -------------------------------------------------------------------------------- Patient/Caregiver Education Details Patient Name: [name]. Date of Service: 11/19/2017 10:15 AM Medical Record Number: [id number] Patient Account Number: [id number] Date of Birth/Gender: [birth year] (72 y.o. Male) Treating RN: Montey Hora Primary Care Physician: Elsie Stain Other Clinician: Referring Physician: Elsie Stain Treating Physician/Extender: Sharalyn Ink in Treatment: 0 Education Assessment Education Provided To: Patient Education Topics Provided Wound/Skin Impairment: Handouts: Other: wound care as ordered Methods: Demonstration, Explain/Verbal Responses: State content correctly Electronic Signature(s) Signed: 11/20/2017 4:05:36 PM By: Montey Hora Entered By: Montey Hora on 11/19/2017 10:57:51 Mehl, [name] ([id number]) -------------------------------------------------------------------------------- Wound Assessment Details Patient Name: [name]. Date of Service: 11/19/2017 10:15 AM Medical Record Number: [id number] Patient Account Number: [id number] Date of Birth/Sex: 10/12/1945 (72 y.o. Male) Treating RN: Roger Shelter Primary Care Karah Caruthers: Elsie Stain Other Clinician: Referring Gustavo Meditz: Elsie Stain Treating Juwana Thoreson/Extender: Melburn Hake, HOYT Weeks in Treatment: 0 Wound Status Wound Number: 1 Primary Trauma,  Other Etiology: Wound Location: Left, Proximal, Medial Lower Leg Wound Status: Healed - Epithelialized Wounding Event: Trauma Comorbid Cataracts, Sleep Apnea, Hypertension, Date Acquired: 11/13/2016 History: Osteoarthritis Weeks Of Treatment: 0 Clustered Wound: No Pending Amputation On Presentation Photos Photo Uploaded By: Alric Quan on 11/20/2017 16:25:30 Wound Measurements Length: (cm) 0 % Re Width: (cm) 0 % Re Depth: (cm) 0 Epit Area: (cm) 0 Tun Volume: (cm) 0 duction in Area: 100% duction in Volume: 100% helialization: None neling: No Wound Description Full Thickness With Exposed Support Classification: Structures Wound Margin: Flat and Intact Exudate None Present Amount: Foul Odor After Cleansing: No Slough/Fibrino Yes Wound Bed Granulation Amount: None Present (0%) Exposed Structure Necrotic Amount: Large (67-100%) Fascia Exposed: No Necrotic Quality: Adherent Slough Fat Layer (Subcutaneous Tissue) Exposed: No Tendon Exposed: No Muscle Exposed: No Joint Exposed: No Bone Exposed: Yes Periwound Skin Texture Zelada, Kimball K. ([id number]) Texture Color No Abnormalities Noted: No No Abnormalities Noted: No Callus: No Atrophie Blanche: No Crepitus: No Cyanosis: No Excoriation: No Ecchymosis: No Induration: No Erythema: No Rash: No Hemosiderin Staining: No Scarring: Yes Mottled: No Pallor: No Moisture Rubor: No No Abnormalities Noted: No Dry / Scaly: No Temperature / Pain Maceration: No Temperature: No Abnormality Wound Preparation Ulcer Cleansing: Rinsed/Irrigated with Saline Topical Anesthetic Applied: Other: lidocaine 4%, Electronic Signature(s) Signed: 11/20/2017 8:47:36 AM By: Roger Shelter Entered By: Roger Shelter on 11/19/2017 10:36:51 [name] ([id number]) -------------------------------------------------------------------------------- Wound Assessment Details Patient Name: [name]. Date of  Service: 11/19/2017 10:15 AM Medical Record Number: [id number] Patient Account Number: [id number] Date of Birth/Sex: September 30, 1945 (72 y.o. Male) Treating RN: Roger Shelter Primary Care Myrka Sylva: Elsie Stain Other Clinician: Referring Sharea Guinther: Elsie Stain Treating Robertta Halfhill/Extender: Melburn Hake, HOYT Weeks in Treatment: 0 Wound Status Wound Number: 2 Primary Trauma, Other Etiology: Wound Location: Left, Medial Lower Leg Wound Status: Open Wounding Event: Trauma Comorbid Cataracts, Sleep Apnea, Hypertension, Date Acquired: 11/13/2016 History: Osteoarthritis Weeks Of Treatment: 0 Clustered Wound: No Pending Amputation On Presentation Photos Photo Uploaded By:  Alric Quan on 11/20/2017 16:25:31 Wound Measurements Length: (cm) 1.3 Width: (cm) 0.3 Depth: (cm) 0.2 Area: (cm) 0.306 Volume: (cm) 0.061 % Reduction in Area: 7.3% % Reduction in Volume: 38.4% Epithelialization: None Tunneling: No Undermining: No Wound Description Full Thickness With Exposed Support Classification: Structures Wound Margin: Flat and Intact Exudate Medium Amount: Exudate Type: Purulent Exudate Color: yellow, brown, green Foul Odor After Cleansing: No Slough/Fibrino Yes Wound Bed Granulation Amount: None Present (0%) Exposed Structure Necrotic Amount: Large (67-100%) Fascia Exposed: No Necrotic Quality: Adherent Slough Fat Layer (Subcutaneous Tissue) Exposed: No Tendon Exposed: No Muscle Exposed: No Joint Exposed: No [place], Isaid K. ([id number]) Bone Exposed: Yes Periwound Skin Texture Texture Color No Abnormalities Noted: No No Abnormalities Noted: No Callus: No Atrophie Blanche: No Crepitus: No Cyanosis: No Excoriation: No Ecchymosis: No Induration: No Erythema: No Rash: No Hemosiderin Staining: No Scarring: Yes Mottled: No Pallor: No Moisture Rubor: No No Abnormalities Noted: No Dry / Scaly: No Temperature / Pain Maceration: No Temperature: No  Abnormality Wound Preparation Ulcer Cleansing: Rinsed/Irrigated with Saline Topical Anesthetic Applied: Other: lidocaine 4%, Treatment Notes Wound #2 (Left, Medial Lower Leg) 1. Cleansed with: Clean wound with Normal Saline 2. Anesthetic Topical Lidocaine 4% cream to wound bed prior to debridement 4. Dressing Applied: Hydrogel Xeroform 5. Secondary [place] Signature(s) Signed: 11/20/2017 8:47:36 AM By: Roger Shelter Entered By: Roger Shelter on 11/19/2017 10:36:52 [name] ([id number]) -------------------------------------------------------------------------------- Wound Assessment Details Patient Name: [name]. Date of Service: 11/19/2017 10:15 AM Medical Record Number: [id number] Patient Account Number: [id number] Date of Birth/Sex: [birth year] (72 y.o. Male) Treating RN: Ahmed Prima Primary Care Mickie Badders: Elsie Stain Other Clinician: Referring Genesis Novosad: Elsie Stain Treating Shanea Karney/Extender: Melburn Hake, HOYT Weeks in Treatment: 0 Wound Status Wound Number: 3 Primary Trauma, Other Etiology: Wound Location: Left Lower Leg - Medial, Distal Wound Status: Open Wounding Event: Trauma Comorbid Cataracts, Sleep Apnea, Hypertension, Date Acquired: 11/13/2016 History: Osteoarthritis Weeks Of Treatment: 0 Clustered Wound: No Pending Amputation On Presentation Photos Photo Uploaded By: Alric Quan on 11/20/2017 16:23:39 Wound Measurements Length: (cm) 0.2 Width: (cm) 0.1 Depth: (cm) 0.1 Area: (cm) 0.016 Volume: (cm) 0.002 % Reduction in Area: 0% % Reduction in Volume: 33.3% Epithelialization: None Tunneling: No Undermining: No Wound Description Full Thickness With Exposed Support Classification: Structures Wound Margin: Flat and Intact Exudate None Present Amount: Foul Odor After Cleansing: No Slough/Fibrino Yes Wound Bed Granulation Amount: None Present (0%) Exposed Structure Necrotic  Amount: Large (67-100%) Fascia Exposed: No Necrotic Quality: Adherent Slough Fat Layer (Subcutaneous Tissue) Exposed: No Tendon Exposed: No Muscle Exposed: No Joint Exposed: No Bone Exposed: Yes Periwound Skin Texture Maglione, [place] K. ([id number]) Texture Color No Abnormalities Noted: No No Abnormalities Noted: No Callus: No Atrophie Blanche: No Crepitus: No Cyanosis: No Excoriation: No Ecchymosis: No Induration: No Erythema: No Rash: No Hemosiderin Staining: No Scarring: Yes Mottled: No Pallor: No Moisture Rubor: No No Abnormalities Noted: No Dry / Scaly: No Temperature / Pain Maceration: No Temperature: No Abnormality Wound Preparation Ulcer Cleansing: Rinsed/Irrigated with Saline Topical Anesthetic Applied: Other: lidocaine 4%, Treatment Notes Wound #3 (Left, Distal, Medial Lower Leg) 1. Cleansed with: Clean wound with Normal Saline 2. Anesthetic Topical Lidocaine 4% cream to wound bed prior to debridement 4. Dressing Applied: Hydrogel Xeroform 5. Secondary [place] Signature(s) Signed: 11/19/2017 4:59:33 PM By: Alric Quan Entered By: Alric Quan on 11/19/2017 10:10:31 [name] ([id number]) -------------------------------------------------------------------------------- Vitals Details Patient Name: [name] Date of Service: 11/19/2017 10:15 AM  Medical Record Number: [id number] Patient Account Number: [id number] Date of Birth/Sex: 09-22-1945 (72 y.o. Male) Treating RN: Carolyne Fiscal, Debi Primary Care Ajahni Nay: Elsie Stain Other Clinician: Referring Sarina Robleto: Elsie Stain Treating Jarius Dieudonne/Extender: Melburn Hake, HOYT Weeks in Treatment: 0 Vital Signs Time Taken: 10:14 Temperature (F): 97.7 Height (in): 74 Pulse (bpm): 77 Weight (lbs): 220 Respiratory Rate (breaths/min): 18 Body Mass Index (BMI): 28.2 Blood Pressure (mmHg): 137/75 Reference Range: 80 - 120 mg / dl Electronic  Signature(s) Signed: 11/19/2017 4:59:33 PM By: Alric Quan Entered By: Alric Quan on 11/19/2017 10:14:49

## 2017-12-03 ENCOUNTER — Encounter: Payer: PPO | Admitting: Physician Assistant

## 2017-12-03 DIAGNOSIS — L97822 Non-pressure chronic ulcer of other part of left lower leg with fat layer exposed: Secondary | ICD-10-CM | POA: Diagnosis not present

## 2017-12-03 DIAGNOSIS — L97816 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 involvement without evidence of necrosis: Secondary | ICD-10-CM | POA: Diagnosis not present

## 2017-12-10 ENCOUNTER — Encounter: Payer: PPO | Admitting: Physician Assistant

## 2017-12-10 DIAGNOSIS — L97826 Non-pressure chronic ulcer of other part of left lower leg with bone involvement without evidence of necrosis: Secondary | ICD-10-CM | POA: Diagnosis not present

## 2017-12-10 DIAGNOSIS — L97816 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 involvement without evidence of necrosis: Secondary | ICD-10-CM | POA: Diagnosis not present

## 2017-12-13 NOTE — Progress Notes (Addendum)
Tyler, Hayden ([id number]) Visit Report for 12/03/2017 Chief Complaint Document Details Patient Name: [name], [name]. Date of Service: 12/03/2017 9:30 AM Medical Record Number: [id number] Patient Account Number: [id number] Date of Birth/Sex: 01/17/1946 (72 y.o. M) Treating RN: Roger Shelter Primary Care Provider: Elsie Stain Other Clinician: Referring Provider: Elsie Stain Treating Provider/Extender: Melburn Hake, HOYT Weeks in Treatment: 2 Information Obtained from: Patient Chief Complaint Left leg ulcer Electronic Signature(s) Signed: 02/07/2018 3:24:21 AM By: Worthy Keeler PA-C Previous Signature: 12/04/2017 9:02:15 AM Version By: Worthy Keeler PA-C Entered By: Worthy Keeler on 02/07/2018 03:08:26 [name] ([id number]) -------------------------------------------------------------------------------- Debridement Details Patient Name: [name]. Date of Service: 12/03/2017 9:30 AM Medical Record Number: [id number] Patient Account Number: [id number] Date of Birth/Sex: [birth year] (72 y.o. M) Treating RN: Roger Shelter Primary Care Provider: Elsie Stain Other Clinician: Referring Provider: Elsie Stain Treating Provider/Extender: Melburn Hake, HOYT Weeks in Treatment: 2 Debridement Performed for Wound #2 Left,Medial Lower Leg Assessment: Performed By: Physician STONE III, HOYT E., PA-C Debridement Type: Debridement Pre-procedure Verification/Time Yes - 10:11 Out Taken: Start Time: 10:11 Pain Control: Other : lidocaine 4% Total Area Debrided (L x W): 1.3 (cm) x 0.3 (cm) = 0.39 (cm) Tissue and other material Viable, Non-Viable, Slough, Subcutaneous, Slough debrided: Level: Skin/Subcutaneous Tissue Debridement Description: Excisional Instrument: Curette Bleeding: Minimum Hemostasis Achieved: Pressure End Time: 10:12 Procedural Pain: 0 Post Procedural Pain: 0 Response to Treatment: Procedure was tolerated well Level of  Consciousness: Awake and Alert Post Debridement Measurements of Total Wound Length: (cm) 1.3 Width: (cm) 0.3 Depth: (cm) 0.2 Volume: (cm) 0.061 Character of Wound/Ulcer Post Debridement: Stable Post Procedure Diagnosis Same as Pre-procedure Electronic Signature(s) Signed: 12/03/2017 5:29:48 PM By: Roger Shelter Signed: 12/04/2017 9:02:15 AM By: Worthy Keeler PA-C Entered By: Roger Shelter on 12/03/2017 10:12:51 Halvorson, Verdon K. ([id number]) -------------------------------------------------------------------------------- HPI Details Patient Name: [name]. Date of Service: 12/03/2017 9:30 AM Medical Record Number: [id number] Patient Account Number: [id number] Date of Birth/Sex: [birth year] (72 y.o. M) Treating RN: Roger Shelter Primary Care Provider: Elsie Stain Other Clinician: Referring Provider: Elsie Stain Treating Provider/Extender: Melburn Hake, HOYT Weeks in Treatment: 2 History of Present Illness HPI Description: 11/13/17 patient presents today for initial evaluation and our clinic due to an issue he is having with his left lower extremity in the anterior portion. In 1955 he had a significant accident where he fractured his leg and subsequently had the had reconstructive surgery in this location. Nonetheless he states he has done fairly well over the years that time without any complication speak of. Subsequently when year ago he had some trauma potentially occurred to the site which led to an opening that really has not closed since that time. He really does not know what the trauma was in fact he tells me that he doesn't remember anything in particular it is possible this might've been something that just opened up on its own he really does not know in the end. Nonetheless he's not really had any discomfort or pain although it does appear that there is bone exposed base of the wound. Being on the anterior shin overlying the tibia and with the thin tissue  in this area anyway this is always going to be a difficult place to deal. The ulcer itself seems to have opened up along the surgical incision site. 11/19/17 on evaluation today I did have patient's culture available for review and it did appear that his culture was negative for any bacteria just normal skin flora  was noted. This makes me think that the fragment of bone which was lifting off was actually not infected but rather just flaking because the bone was to dry with the dressings that were place. Nonetheless I do think he's doing much better today the bone seems to be staying much more moist and I think is better quality overall. In general I'm very happy with the overall appearance of the wound today compared to previous there's little bit of erythema there may be just slight cellulitis surrounding the wound bed this was discussed with the patient. He does have doxycycline 100 mg on hand already which I think he can take and he is going to go ahead and start doing this. His MRI is scheduled for the 10th of this month and five days. 12/03/17 on evaluation today patient appears to be doing rather well in regard to his lower Trinity ulcer. He really has not showed much in the way of improvement in regard to the overall size of the wound. With that being said he still have bone noted in the base of the wound there's really no granulation at this point. I still think the Theraskin would be the ideal thing for him. He did have the MRI of his lower extremity which showed posttraumatic deformities of the left tibia and fibula. There is no evidence of ongoing osteomyelitis, abscess, or soft tissue inflammation which was good news upset do not think there's any evidence of significant infection at this point. Electronic Signature(s) Signed: 12/04/2017 9:02:15 AM By: Worthy Keeler PA-C Entered By: Worthy Keeler on 12/03/2017 10:19:37 [name]  ([id number]) -------------------------------------------------------------------------------- Physical Exam Details Patient Name: Tyler, Hayden. Date of Service: 12/03/2017 9:30 AM Medical Record Number: [id number] Patient Account Number: [id number] Date of Birth/Sex: 30-Mar-1946 (72 y.o. M) Treating RN: Roger Shelter Primary Care Provider: Elsie Stain Other Clinician: Referring Provider: Elsie Stain Treating Provider/Extender: STONE III, HOYT Weeks in Treatment: 2 Constitutional Well-nourished and well-hydrated in no acute distress. Respiratory normal breathing without difficulty. Psychiatric this patient is able to make decisions and demonstrates good insight into disease process. Alert and Oriented x 3. pleasant and cooperative. Notes On inspection today patient's wound actually seems to be doing about the same at this point. There does not appear to be any evidence of infection which is good news. He has been waiting to hear about the Theraskin and whether or not this was approved. Right now we are still going to need to gather another weeks worth of notes after this week before we can recement for Theraskin approval. We did not really have any good wound care notes from prior to my valuation in order to compare and submit for approval. Even though this wound has been going on for quite some time. Electronic Signature(s) Signed: 12/04/2017 9:02:15 AM By: Worthy Keeler PA-C Entered By: Worthy Keeler on 12/03/2017 10:25:50 [name] ([id number]) -------------------------------------------------------------------------------- Physician Orders Details Patient Name: [name] Date of Service: 12/03/2017 9:30 AM Medical Record Number: [id number] Patient Account Number: [id number] Date of Birth/Sex: 1946/01/22 (72 y.o. 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 Anesthetic (add to Medication List) Wound #2 Left,Medial Lower Leg o Topical Lidocaine 4% cream applied to wound bed prior to debridement (In Clinic Only). Skin Barriers/Peri-Wound Care Wound #2 Left,Medial Lower Leg o Skin Prep Primary Wound Dressing Wound #2 Left,Medial Lower Leg o Xeroform - with hydrogel Secondary Dressing Wound #2 Left,Medial Lower Leg o Boardered Foam Dressing Dressing Change Frequency Wound #2 Left,Medial Lower Leg o Three times weekly Follow-up Appointments Wound #2 Left,Medial Lower Leg o Return Appointment in 2 weeks. Edema Control Wound #2 Left,Medial Lower Leg o Elevate legs to the level of the heart and pump ankles as often as possible Additional Orders / Instructions LARZ, MARK ([id number]) Wound #2 Left,Medial Lower Leg o Vitamin A; Vitamin C, Zinc - Multivitamin ok Electronic Signature(s) Signed: 12/03/2017 5:29:48 PM By: Roger Shelter Signed: 12/04/2017 9:02:15 AM By: Worthy Keeler PA-C Entered By: Roger Shelter on 12/03/2017 10:13:30 [name] ([id number]) -------------------------------------------------------------------------------- Problem List Details Patient Name: [name]. Date of Service: 12/03/2017 9:30 AM Medical Record Number: [id number] Patient Account Number: [id number] Date of Birth/Sex: 1946-03-05 (72 y.o. 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 Date of Service: 12/03/2017 9:30 AM Medical Record Number: [id number] Patient Account Number: [id number] Date of Birth/Sex: 05/31/1945 (72 y.o. M) Treating RN: Primary Care Provider: Elsie Stain Other Clinician: Referring Provider: Elsie Stain Treating Provider/Extender: Melburn Hake, HOYT Weeks in Treatment: 2 Subjective Chief Complaint Information obtained from Patient Left leg ulcer History of Present Illness (HPI) 11/13/17 patient presents today for initial evaluation and our clinic due to an issue he is having with his left lower extremity in the anterior portion. In 1955 he had a significant accident where he fractured his leg and subsequently had the had reconstructive surgery in this location. Nonetheless he states he has done fairly well over the years that time without any complication speak of. Subsequently when year ago he had some trauma potentially occurred to the site which led to an opening that really has not closed since that time. He really does not know what the trauma was in fact he tells me that he doesn't remember anything in particular it is  possible this might've been something that just opened up on its own he really does not know in the end. Nonetheless he's not really had any discomfort or pain although it does appear that there is bone exposed base of the wound. Being on the anterior shin overlying the tibia and with the thin tissue in this area anyway this is always going to be a difficult place to deal. The ulcer itself seems to have opened up along the surgical incision site. 11/19/17 on evaluation today I did have patient's culture available for review and it did appear that his culture was negative  for any bacteria just normal skin flora was noted. This makes me think that the fragment of bone which was lifting off was actually not infected but rather just flaking because the bone was to dry with the dressings that were place. Nonetheless I do think he's doing much better today the bone seems to be staying much more moist and I think is better quality overall. In general I'm very happy with the overall appearance of the wound today compared to previous there's little bit of erythema there may be just slight cellulitis surrounding the wound bed this was discussed with the patient. He does have doxycycline 100 mg on hand already which I think he can take and he is going to go ahead and start doing this. His MRI is scheduled for the 10th of this month and five days. 12/03/17 on evaluation today patient appears to be doing rather well in regard to his lower Trinity ulcer. He really has not showed much in the way of improvement in regard to the overall size of the wound. With that being said he still have bone noted in the base of the wound there's really no granulation at this point. I still think the Theraskin would be the ideal thing for him. He did have the MRI of his lower extremity which showed posttraumatic deformities of the left tibia and fibula. There is no evidence of ongoing osteomyelitis, abscess, or soft tissue  inflammation which was good news upset do not think there's any evidence of significant infection at this point. Patient History Information obtained from Patient. Family History Diabetes - Father, Heart Disease - Paternal Grandparents, Hypertension - Mother, Lung Disease - Mother, No family history of Cancer, Hereditary Spherocytosis, Kidney Disease, Seizures, Stroke, Thyroid Problems, Tuberculosis. Social History Never smoker, Marital Status - Married, Alcohol Use - Never, Drug Use - No History, Caffeine Use - Moderate. 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 Respiratory The patient has no complaints or symptoms. Cardiovascular The patient has no complaints or symptoms. Psychiatric The patient has no complaints or symptoms. Objective Constitutional Well-nourished and well-hydrated in no acute distress. Vitals Time Taken: 9:37 AM, Height: 74 in, Weight: 220 lbs, BMI: 28.2, Temperature: 98.2 F, Pulse: 69 bpm, Respiratory Rate: 18 breaths/min, Blood Pressure: 144/74 mmHg. Respiratory normal breathing without difficulty. Psychiatric this patient is able to make decisions and demonstrates good insight into disease process. Alert and Oriented x 3. pleasant and cooperative. General Notes: On inspection today patient's wound actually seems to be doing about the same at this point. There does not appear to be any evidence of infection which is good news. He has been waiting to hear about the Theraskin and whether or not this was approved. Right now we are still going to need to gather another weeks worth of notes after this week before we can recement for Theraskin approval. We did not really have any good wound care notes from prior to my valuation in order to compare and submit for approval. Even  though this wound has been going on for quite some time. Integumentary (Hair, Skin) Wound #2 status is Open. Original cause of wound was Trauma. The wound is located on the Left,Medial Lower Leg. The wound measures 1.3cm length x 0.3cm width x 0.2cm depth; 0.306cm^2 area and 0.061cm^3 volume. There is bone exposed. There is no tunneling noted, however, there is undermining  starting at 12:00 and ending at 6:00 with a maximum distance of 0.4cm. There is a medium amount of serosanguineous drainage noted. The wound margin is flat and intact. There is no granulation within the wound bed. There is a large (67-100%) amount of necrotic tissue within the wound bed including Adherent Slough. The periwound skin appearance exhibited: Scarring, Erythema. The periwound skin appearance did not exhibit: Callus, Crepitus, Excoriation, Induration, Rash, Dry/Scaly, Maceration, Atrophie Blanche, Cyanosis, Ecchymosis, Hemosiderin Staining, Mottled, Pallor, Rubor. The surrounding wound skin color is noted with erythema. Periwound temperature was noted as No Abnormality. The periwound has tenderness on palpation. Wound #3 status is Healed - Epithelialized. Original cause of wound was Trauma. The wound is located on the Left,Distal,Medial Lower Leg. The wound measures 0cm length x 0cm width x 0cm depth; 0cm^2 area and 0cm^3 volume. 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 There was a Excisional Skin/Subcutaneous Tissue Debridement with a total area of 0.39 sq cm performed by STONE III, HOYT E., PA-C. With the following instrument(s): Curette to remove Viable and Non-Viable  tissue/material. Material removed includes Subcutaneous Tissue and Slough and after achieving pain control using Other (lidocaine 4%). No specimens were taken. A time out was conducted at 10:11, prior to the start of the procedure. A Minimum amount of bleeding was controlled with Pressure. The procedure was tolerated well with a pain level of 0 throughout and a pain level of 0 following the procedure. Patient s Level of Consciousness post procedure was recorded as Awake and Alert. Post Debridement Measurements: 1.3cm length x 0.3cm width x 0.2cm depth; 0.061cm^3 volume. Character of Wound/Ulcer Post Debridement is stable. Post procedure Diagnosis Wound #2: Same as Pre-Procedure Plan Wound Cleansing: Wound #2 Left,Medial Lower Leg: Clean wound with Normal Saline. Anesthetic (add to Medication List): Wound #2 Left,Medial Lower Leg: Topical Lidocaine 4% cream applied to wound bed prior to debridement (In Clinic Only). Skin Barriers/Peri-Wound Care: Wound #2 Left,Medial Lower Leg: Skin Prep Primary Wound Dressing: Wound #2 Left,Medial Lower Leg: Xeroform - with hydrogel Secondary Dressing: Wound #2 Left,Medial Lower Leg: Boardered Foam Dressing Dressing Change Frequency: Wound #2 Left,Medial Lower Leg: Three times weekly Follow-up Appointments: Wound #2 Left,Medial Lower Leg: Return Appointment in 2 weeks. MAEJOR, ERVEN ([id number]) Edema Control: Wound #2 Left,Medial Lower Leg: Elevate legs to the level of the heart and pump ankles as often as possible Additional Orders / Instructions: Wound #2 Left,Medial Lower Leg: Vitamin A; Vitamin C, Zinc - Multivitamin ok At this point my suggestion is gonna be that we go ahead and initiate treatment with the same hydrogel and Xeroform to keep things moist as far as the bone is concerned. Patient is in agreement with the plan. Subsequently I'm gonna see him for reevaluation next weeks time. If we still are not really making progress in  regard to the wound size which we have been up to this point then my suggestion will be to go ahead and resubmit for the Theraskin to try to get approval. He's in agreement that plan. The good news is his MRI was negative and there's no evidence of infection even though he does continue to have dry and flaky bone I think this is more due to the  fact we have difficulty keeping the area moist he did not receive the hydrogel from medical supply and he therefore has not been applying this I think that will make a difference as well. Obviously he did not contact her office to let us know he did not receive it for [place] to tell him that he could get KY jelly over-the-counter. We will see were things stand at follow-up. Please see above for specific wound care orders. We will see patient for re-evaluation in 1 week(s) here in the clinic. If anything worsens or changes patient will contact our office for additional recommendations. Electronic Signature(s) Signed: 02/07/2018 3:24:21 AM By: Worthy Keeler PA-C Previous Signature: 12/04/2017 9:02:15 AM Version By: Worthy Keeler PA-C Entered By: Worthy Keeler on 02/07/2018 03:09:13 [name] ([id number]) -------------------------------------------------------------------------------- ROS/PFSH Details Patient Name: [name] Date of Service: 12/03/2017 9:30 AM Medical Record Number: [id number] Patient Account Number: [id number] Date of Birth/Sex: 1946-04-01 (72 y.o. M) Treating RN: Roger Shelter Primary Care Provider: Elsie Stain Other Clinician: Referring Provider: Elsie Stain Treating Provider/Extender: Melburn Hake, HOYT Weeks in Treatment: 2 Information Obtained From Patient Wound History Do you currently have one or more open woundso Yes How many open wounds do you currently haveo 1 Approximately how long have you had your woundso 1 year How have you been treating your wound(s) until nowo nuskin Has your wound(s) ever  healed and then re-openedo No Have you had any lab work done in the past montho No Have you tested positive for an antibiotic resistant organism (MRSA, VRE)o No Have you tested positive for osteomyelitis (bone infection)o No Have you had any tests for circulation on your legso No Constitutional Symptoms (General Health) Complaints and Symptoms: Negative for: Fever; Chills Eyes Medical History: Positive for: Cataracts - removed Negative for: Glaucoma; Optic Neuritis Ear/Nose/Mouth/Throat Medical History: Negative for: Chronic sinus problems/congestion; Middle ear problems Hematologic/Lymphatic Medical History: Negative for: Anemia; Hemophilia; Human Immunodeficiency Virus; Lymphedema; Sickle Cell Disease Respiratory Complaints and Symptoms: No Complaints or Symptoms Medical History: Positive for: Sleep Apnea - CPAP Negative for: Aspiration; Asthma; Chronic Obstructive Pulmonary Disease (COPD); Pneumothorax; Tuberculosis Cardiovascular Complaints and Symptoms: No Complaints or Symptoms Medical History: Positive for: Hypertension Stenerson, Jihan K. ([id number]) Negative for: Angina; Arrhythmia; Congestive Heart Failure; Coronary Artery Disease; Deep Vein Thrombosis; Hypotension; Myocardial Infarction; Peripheral Arterial Disease; Peripheral Venous Disease; Phlebitis; Vasculitis Past Medical History Notes: heart murmur Gastrointestinal Medical History: Negative for: Cirrhosis ; Colitis; Crohnos; Hepatitis A; Hepatitis B; Hepatitis C Endocrine Medical History: Negative for: Type I Diabetes; Type II Diabetes Genitourinary Medical History: Negative for: End Stage Renal Disease Past Medical History Notes: HSV Immunological Medical History: Negative for: Lupus Erythematosus; Raynaudos; Scleroderma Integumentary (Skin) Medical History: Negative for: History of Burn; History of pressure wounds Musculoskeletal Medical History: Positive for: Osteoarthritis Negative for: Gout;  Rheumatoid Arthritis Neurologic Medical History: Negative for: Dementia; Neuropathy; Quadriplegia; Paraplegia; Seizure Disorder Oncologic Medical History: Negative for: Received Chemotherapy; Received Radiation Psychiatric Complaints and Symptoms: No Complaints or Symptoms Medical History: Negative for: Anorexia/bulimia; Confinement Anxiety HBO Extended History Items Eyes: Cataracts Kafer, Benjy K. ([id number]) Immunizations Pneumococcal Vaccine: Received Pneumococcal Vaccination: Yes Immunization Notes: up to date Implantable Devices Family and Social History Cancer: No; Diabetes: Yes - Father; Heart Disease: Yes - Paternal Grandparents; Hereditary Spherocytosis: No; Hypertension: Yes - Mother; Kidney Disease: No; Lung Disease: Yes - Mother; Seizures: No; Stroke: No; Thyroid Problems: No; Tuberculosis: No; Never smoker; Marital Status - Married; Alcohol Use: Never; Drug Use: No History; Caffeine  Use: Moderate; Financial Concerns: No; Food, Clothing or Shelter Needs: No; Support System Lacking: No; Transportation Concerns: No; Advanced Directives: Yes (Not Provided); Patient does not want information on Advanced Directives; Medical Power of Attorney: Yes - Tammy Rezas (Not Provided) Physician Affirmation I have reviewed and agree with the above information. Electronic Signature(s) Signed: 12/03/2017 5:29:48 PM By: Roger Shelter Signed: 12/04/2017 9:02:15 AM By: Worthy Keeler PA-C Entered By: Worthy Keeler on 12/03/2017 10:25:16 [name] ([id number]) -------------------------------------------------------------------------------- SuperBill Details Patient Name: [name]. Date of Service: 12/03/2017 Medical Record Number: [id number] Patient Account Number: [id number] Date of Birth/Sex: Jan 15, 1946 (72 y.o. 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

## 2017-12-13 NOTE — Progress Notes (Addendum)
SEVASTIAN, WITCZAK ([id number]) Visit Report for 12/03/2017 Arrival Information Details Patient Name: [name], [name]. Date of Service: 12/03/2017 9:30 AM Medical Record Number: [id number] Patient Account Number: [id number] Date of Birth/Sex: 02-Jan-1946 (72 y.o. M) Treating RN: Ahmed Prima Primary Care Ruben Mahler: Elsie Stain Other Clinician: Referring Zeplin Aleshire: Elsie Stain Treating Teyah Rossy/Extender: Melburn Hake, HOYT Weeks in Treatment: 2 Visit Information History Since Last Visit All ordered tests and consults were completed: No Patient Arrived: Ambulatory Added or deleted any medications: No Arrival Time: 09:36 Any new allergies or adverse reactions: No Accompanied By: wife Had a fall or experienced change in No Transfer Assistance: None activities of daily living that may affect Patient Identification Verified: Yes risk of falls: Secondary Verification Process Yes Signs or symptoms of abuse/neglect since last visito No Completed: Hospitalized since last visit: No Patient Requires Transmission-Based No Implantable device outside of the clinic excluding No Precautions: cellular tissue based products placed in the center Patient Has Alerts: Yes since last visit: Patient Alerts: ABI [place] BILATERAL Has Dressing in Place as Prescribed: Yes >220 Pain Present Now: No Electronic Signature(s) Signed: 12/03/2017 5:34:19 PM By: Alric Quan Entered By: Alric Quan on 12/03/2017 09:37:06 Carbon, [name] ([id number]) -------------------------------------------------------------------------------- Encounter Discharge Information Details Patient Name: [name]. Date of Service: 12/03/2017 9:30 AM Medical Record Number: [id number] Patient Account Number: [id number] Date of Birth/Sex: 18-Mar-1946 (72 y.o. M) Treating RN: Cornell Barman Primary Care Natesha Hassey: Elsie Stain Other Clinician: Referring Esraa Seres: Elsie Stain Treating Lillyth Spong/Extender: Melburn Hake,  HOYT Weeks in Treatment: 2 Encounter Discharge Information Items Discharge Condition: Stable Ambulatory Status: Ambulatory Discharge Destination: Home Transportation: Private Auto Accompanied By: wife Schedule Follow-up Appointment: Yes Clinical Summary of Care: Electronic Signature(s) Signed: 12/04/2017 9:35:33 AM By: Gretta Cool, BSN, RN, CWS, Kim RN, BSN Entered By: Gretta Cool, BSN, RN, CWS, Kim on 12/03/2017 10:29:34 [name] ([id number]) -------------------------------------------------------------------------------- Lower Extremity Assessment Details Patient Name: Tyler, Hayden. Date of Service: 12/03/2017 9:30 AM Medical Record Number: [id number] Patient Account Number: [id number] Date of Birth/Sex: Jul 25, 1945 (72 y.o. M) Treating RN: Ahmed Prima Primary Care Laureen Frederic: Elsie Stain Other Clinician: Referring Cayleigh Paull: Elsie Stain Treating Chastin Garlitz/Extender: Melburn Hake, HOYT Weeks in Treatment: 2 Vascular Assessment Pulses: Dorsalis Pedis Palpable: [Left:Yes] Posterior Tibial Extremity colors, hair growth, and conditions: Extremity Color: [Left:Mottled] Temperature of Extremity: [Left:Warm] Capillary Refill: [Left:< 3 seconds] Toe Nail Assessment Left: Right: Thick: No Discolored: No Deformed: No Improper Length and Hygiene: No Electronic Signature(s) Signed: 12/03/2017 5:34:19 PM By: Alric Quan Entered By: Alric Quan on 12/03/2017 09:45:00 Landress, [name] ([id number]) -------------------------------------------------------------------------------- Multi Wound Chart Details Patient Name: [name]. Date of Service: 12/03/2017 9:30 AM Medical Record Number: [id number] Patient Account Number: [id number] Date of Birth/Sex: [birth year] (72 y.o. M) Treating RN: Roger Shelter Primary Care Traveion Ruddock: Elsie Stain Other Clinician: Referring Guerline Happ: Elsie Stain Treating Kynlea Blackston/Extender: Melburn Hake, HOYT Weeks in Treatment:  2 Vital Signs Height(in): 74 Pulse(bpm): 37 Weight(lbs): 220 Blood Pressure(mmHg): 144/74 Body Mass Index(BMI): 28 Temperature(F): 98.2 Respiratory Rate 18 (breaths/min): Photos: [2:No Photos] [3:No Photos] [N/A:N/A] Wound Location: [2:Left Lower Leg - Medial] [3:Left, Distal, Medial Lower Leg] [N/A:N/A] Wounding Event: [2:Trauma] [3:Trauma] [N/A:N/A] Primary Etiology: [2:Trauma, Other] [3:Trauma, Other] [N/A:N/A] Comorbid History: [2:Cataracts, Sleep Apnea, Hypertension, Osteoarthritis] [3:N/A] [N/A:N/A] Date Acquired: [2:11/13/2016] [3:11/13/2016] [N/A:N/A] Weeks of Treatment: [2:2] [3:2] [N/A:N/A] Wound Status: [2:Open] [3:Healed - Epithelialized] [N/A:N/A] Pending Amputation on [2:Yes] [3:Yes] [N/A:N/A] Presentation: Measurements L x W x D [2:1.3x0.3x0.2] [3:0x0x0] [N/A:N/A] (cm) Area (cm) : [2:0.306] [3:0] [N/A:N/A] Volume (cm) : [2:0.061] [3:0] [N/A:N/A] %  Reduction in Area: [2:7.30%] [3:100.00%] [N/A:N/A] % Reduction in Volume: [2:38.40%] [3:100.00%] [N/A:N/A] Starting Position 1 [2:12] (o'clock): Ending Position 1 [2:6] (o'clock): Maximum Distance 1 (cm): [2:0.4] Undermining: [2:Yes] [3:N/A] [N/A:N/A] Classification: [2:Full Thickness With Exposed Support Structures] [3:Full Thickness With Exposed Support Structures] [N/A:N/A] Exudate Amount: [2:Medium] [3:N/A] [N/A:N/A] Exudate Type: [2:Serosanguineous] [3:N/A] [N/A:N/A] Exudate Color: [2:red, brown] [3:N/A] [N/A:N/A] Wound Margin: [2:Flat and Intact] [3:N/A] [N/A:N/A] Granulation Amount: [2:None Present (0%)] [3:N/A] [N/A:N/A] Necrotic Amount: [2:Large (67-100%)] [3:N/A] [N/A:N/A] Exposed Structures: [2:Bone: Yes Fascia: No Fat Layer (Subcutaneous Tissue) Exposed: No Tendon: No] [3:N/A] [N/A:N/A] Muscle: No Joint: No Epithelialization: None N/A N/A Periwound Skin Texture: Scarring: Yes No Abnormalities Noted N/A Excoriation: No Induration: No Callus: No Crepitus: No Rash: No Periwound Skin  Moisture: Maceration: No No Abnormalities Noted N/A Dry/Scaly: No Periwound Skin Color: Erythema: Yes No Abnormalities Noted N/A Atrophie Blanche: No Cyanosis: No Ecchymosis: No Hemosiderin Staining: No Mottled: No Pallor: No Rubor: No Temperature: No Abnormality N/A N/A Tenderness on Palpation: Yes No N/A Wound Preparation: Ulcer Cleansing: N/A N/A Rinsed/Irrigated with Saline Topical Anesthetic Applied: Other: lidocaine 4% Treatment Notes Electronic Signature(s) Signed: 12/03/2017 5:29:48 PM By: Roger Shelter Entered By: Roger Shelter on 12/03/2017 10:08:58 [name] ([id number]) -------------------------------------------------------------------------------- [place] Details Patient Name: ANTWION, CARPENTER. Date of Service: 12/03/2017 9:30 AM Medical Record Number: [id number] Patient Account Number: [id number] Date of Birth/Sex: 1945-11-06 (72 y.o. M) Treating RN: Roger Shelter Primary Care Dionicia Cerritos: Elsie Stain Other Clinician: Referring Rekita Miotke: Elsie Stain Treating Jeimy Bickert/Extender: Melburn Hake, HOYT Weeks in Treatment: 2 Active Inactive ` Necrotic Tissue Nursing Diagnoses: Knowledge deficit related to management of necrotic/devitalized tissue Goals: Necrotic/devitalized tissue will be minimized in the wound bed Date Initiated: 11/13/2017 Target Resolution Date: 11/16/2017 Goal Status: Active Interventions: Provide education on necrotic tissue and debridement process Treatment Activities: Apply topical anesthetic as ordered : 11/13/2017 Notes: ` Orientation to the Wound Care Program Nursing Diagnoses: Knowledge deficit related to the wound healing center program Goals: Patient/caregiver will verbalize understanding of the [place] Program Date Initiated: 11/13/2017 Target Resolution Date: 11/16/2017 Goal Status: Active Interventions: Provide education on orientation to the wound  center Notes: ` Wound/Skin Impairment Nursing Diagnoses: Impaired tissue integrity Goals: Ulcer/skin breakdown will have a volume reduction of 30% by week 4 Date Initiated: 11/13/2017 Target Resolution Date: 12/14/2017 CIRILO, CANNER ([id number]) Goal Status: Active Interventions: Assess patient/caregiver ability to obtain necessary supplies Treatment Activities: Skin care regimen initiated : 11/13/2017 Notes: Electronic Signature(s) Signed: 12/03/2017 5:29:48 PM By: Roger Shelter Entered By: Roger Shelter on 12/03/2017 10:08:52 [name] ([id number]) -------------------------------------------------------------------------------- Pain Assessment Details Patient Name: [name]. Date of Service: 12/03/2017 9:30 AM Medical Record Number: [id number] Patient Account Number: [id number] Date of Birth/Sex: 10/16/1945 (72 y.o. M) Treating RN: Ahmed Prima Primary Care Yahir Tavano: Elsie Stain Other Clinician: Referring Pat Elicker: Elsie Stain Treating Brayleigh Rybacki/Extender: Melburn Hake, HOYT Weeks in Treatment: 2 Active Problems Location of Pain Severity and Description of Pain Patient Has Paino No Site Locations Pain Management and Medication Current Pain Management: Electronic Signature(s) Signed: 12/03/2017 5:34:19 PM By: Alric Quan Entered By: Alric Quan on 12/03/2017 09:37:11 [name] ([id number]) -------------------------------------------------------------------------------- Patient/Caregiver Education Details Patient Name: [name]. Date of Service: 12/03/2017 9:30 AM Medical Record Number: [id number] Patient Account Number: [id number] Date of Birth/Gender: 1945/04/23 (72 y.o. M) Treating RN: Cornell Barman Primary Care Physician: Elsie Stain Other Clinician: Referring Physician: Elsie Stain Treating Physician/Extender: Sharalyn Ink in Treatment: 2 Education Assessment Education Provided  To: Patient Education  Topics Provided Wound/Skin Impairment: Handouts: Caring for Your Ulcer, Other: treatment complete Methods: Demonstration Responses: State content correctly Electronic Signature(s) Signed: 12/04/2017 9:35:33 AM By: Gretta Cool, BSN, RN, CWS, Kim RN, BSN Entered By: Gretta Cool, BSN, RN, CWS, Kim on 12/03/2017 10:29:56 [name] ([id number]) -------------------------------------------------------------------------------- Wound Assessment Details Patient Name: DHANVIN, SZETO. Date of Service: 12/03/2017 9:30 AM Medical Record Number: [id number] Patient Account Number: [id number] Date of Birth/Sex: 1946/01/17 (72 y.o. M) Treating RN: Ahmed Prima Primary Care Toy Eisemann: Elsie Stain Other Clinician: Referring Josslyn Ciolek: Elsie Stain Treating Jefferey Lippmann/Extender: Melburn Hake, HOYT Weeks in Treatment: 2 Wound Status Wound Number: 2 Primary Trauma, Other Etiology: Wound Location: Left Lower Leg - Medial Wound Status: Open Wounding Event: Trauma Comorbid Cataracts, Sleep Apnea, Hypertension, Date Acquired: 11/13/2016 History: Osteoarthritis Weeks Of Treatment: 2 Clustered Wound: No Pending Amputation On Presentation Photos Photo Uploaded By: Alric Quan on 12/03/2017 16:37:12 Wound Measurements Length: (cm) 1.3 Width: (cm) 0.3 Depth: (cm) 0.2 Area: (cm) 0.306 Volume: (cm) 0.061 % Reduction in Area: 7.3% % Reduction in Volume: 38.4% Epithelialization: None Tunneling: No Undermining: Yes Starting Position (o'clock): 12 Ending Position (o'clock): 6 Maximum Distance: (cm) 0.4 Wound Description Full Thickness With Exposed Support Classification: Structures Wound Margin: Flat and Intact Exudate Medium Amount: Exudate Type: Serosanguineous Exudate Color: red, brown Foul Odor After Cleansing: No Slough/Fibrino Yes Wound Bed Granulation Amount: None Present (0%) Exposed Structure Necrotic Amount: Large (67-100%) Fascia Exposed:  No Strnad, Saturnino K. ([id number]) Necrotic Quality: Adherent Slough Fat Layer (Subcutaneous Tissue) Exposed: No Tendon Exposed: No Muscle Exposed: No Joint Exposed: No Bone Exposed: Yes Periwound Skin Texture Texture Color No Abnormalities Noted: No No Abnormalities Noted: No Callus: No Atrophie Blanche: No Crepitus: No Cyanosis: No Excoriation: No Ecchymosis: No Induration: No Erythema: Yes Rash: No Hemosiderin Staining: No Scarring: Yes Mottled: No Pallor: No Moisture Rubor: No No Abnormalities Noted: No Dry / Scaly: No Temperature / Pain Maceration: No Temperature: No Abnormality Tenderness on Palpation: Yes Wound Preparation Ulcer Cleansing: Rinsed/Irrigated with Saline Topical Anesthetic Applied: Other: lidocaine 4%, Electronic Signature(s) Signed: 12/03/2017 5:34:19 PM By: Alric Quan Entered By: Alric Quan on 12/03/2017 09:43:57 Andrades, [name] ([id number]) -------------------------------------------------------------------------------- Wound Assessment Details Patient Name: [name]. Date of Service: 12/03/2017 9:30 AM Medical Record Number: [id number] Patient Account Number: [id number] Date of Birth/Sex: [birth year] (72 y.o. M) Treating RN: Ahmed Prima Primary Care Ronda Rajkumar: Elsie Stain Other Clinician: Referring Sarim Rothman: Elsie Stain Treating Narjis Mira/Extender: Melburn Hake, HOYT Weeks in Treatment: 2 Wound Status Wound Number: 3 Primary Etiology: Trauma, Other Wound Location: Left, Distal, Medial Lower Leg Wound Status: Healed - Epithelialized Wounding Event: Trauma Date Acquired: 11/13/2016 Weeks Of Treatment: 2 Clustered Wound: No Pending Amputation On Presentation Photos Photo Uploaded By: Alric Quan on 12/03/2017 16:36:24 Wound Measurements Length: (cm) 0 Width: (cm) 0 Depth: (cm) 0 Area: (cm) 0 Volume: (cm) 0 % Reduction in Area: 100% % Reduction in Volume: 100% Wound Description Full Thickness  With Exposed Support Classification: Structures Periwound Skin Texture Texture Color No Abnormalities Noted: No No Abnormalities Noted: No Moisture No Abnormalities Noted: No Electronic Signature(s) Signed: 12/03/2017 5:34:19 PM By: Alric Quan Entered By: Alric Quan on 12/03/2017 09:44:08 [name] ([id number]) -------------------------------------------------------------------------------- Vitals Details Patient Name: [name]. Date of Service: 12/03/2017 9:30 AM Medical Record Number: [id number] Patient Account Number: [id number] Date of Birth/Sex: May 26, 1945 (72 y.o. M) Treating RN: Ahmed Prima Primary Care Maija Biggers: Elsie Stain Other Clinician: Referring Devonna Oboyle: Elsie Stain Treating Hadlee Burback/Extender: STONE III, HOYT Weeks in Treatment: 2 Vital Signs Time  Taken: 09:37 Temperature (F): 98.2 Height (in): 74 Pulse (bpm): 69 Weight (lbs): 220 Respiratory Rate (breaths/min): 18 Body Mass Index (BMI): 28.2 Blood Pressure (mmHg): 144/74 Reference Range: 80 - 120 mg / dl Electronic Signature(s) Signed: 12/03/2017 5:34:19 PM By: Alric Quan Entered By: Alric Quan on 12/03/2017 09:38:38

## 2017-12-18 ENCOUNTER — Ambulatory Visit: Payer: PPO | Admitting: Physician Assistant

## 2017-12-20 ENCOUNTER — Encounter: Payer: PPO | Attending: Nurse Practitioner | Admitting: Nurse Practitioner

## 2017-12-20 DIAGNOSIS — G4730 Sleep apnea, unspecified: Secondary | ICD-10-CM | POA: Insufficient documentation

## 2017-12-20 DIAGNOSIS — Z79899 Other long term (current) drug therapy: Secondary | ICD-10-CM | POA: Insufficient documentation

## 2017-12-20 DIAGNOSIS — L97826 Non-pressure chronic ulcer of other part of left lower leg with bone involvement without evidence of necrosis: Secondary | ICD-10-CM | POA: Diagnosis not present

## 2017-12-20 DIAGNOSIS — I10 Essential (primary) hypertension: Secondary | ICD-10-CM | POA: Insufficient documentation

## 2017-12-20 DIAGNOSIS — L97816 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 involvement without evidence of necrosis: Secondary | ICD-10-CM | POA: Diagnosis not present

## 2017-12-20 DIAGNOSIS — L539 Erythematous condition, unspecified: Secondary | ICD-10-CM | POA: Diagnosis not present

## 2017-12-21 ENCOUNTER — Other Ambulatory Visit
Admission: RE | Admit: 2017-12-21 | Discharge: 2017-12-21 | Disposition: A | Discharge status: Home / Self Care | Payer: PPO | Source: Ambulatory Visit | Attending: Nurse Practitioner | Admitting: Nurse Practitioner

## 2017-12-21 DIAGNOSIS — B999 Unspecified infectious disease: Secondary | ICD-10-CM | POA: Insufficient documentation

## 2017-12-23 LAB — AEROBIC CULTURE W GRAM STAIN (SUPERFICIAL SPECIMEN): Culture: NORMAL

## 2017-12-23 LAB — AEROBIC CULTURE  (SUPERFICIAL SPECIMEN): Gram Stain: NONE SEEN

## 2017-12-23 NOTE — Progress Notes (Addendum)
NACHMAN, SUNDT ([id number]) Visit Report for 12/20/2017 Chief Complaint Document Details Patient Name: [name], [name]. Date of Service: 12/20/2017 2:45 PM Medical Record Number: [id number] Patient Account Number: [id number] Date of Birth/Sex: 1945/08/03 (72 y.o. Male) Treating RN: Roger Shelter Primary Care Provider: Elsie Stain Other Clinician: Referring Provider: Elsie Stain Treating Provider/Extender: Cathie Olden in Treatment: 5 Information Obtained from: Patient Chief Complaint Left leg ulcer Electronic Signature(s) Signed: 12/24/2017 9:14:44 PM By: Lawanda Cousins Previous Signature: 12/20/2017 6:08:14 PM Version By: Lawanda Cousins Entered By: Lawanda Cousins on 12/24/2017 21:14:44 [name] ([id number]) -------------------------------------------------------------------------------- Debridement Details Patient Name: [name]. Date of Service: 12/20/2017 2:45 PM Medical Record Number: [id number] Patient Account Number: [id number] Date of Birth/Sex: Jul 31, 1945 (72 y.o. Male) Treating RN: Roger Shelter Primary Care Provider: Elsie Stain Other Clinician: Referring Provider: Elsie Stain Treating Provider/Extender: Cathie Olden in Treatment: 5 Debridement Performed for Wound #2 Left,Medial Lower Leg Assessment: Performed By: Physician Lawanda Cousins, NP Debridement Type: Debridement Pre-procedure Verification/Time Yes - 15:43 Out Taken: Start Time: 15:43 Pain Control: Other : lidocaine 4% Total Area Debrided (L x W): 1.4 (cm) x 0.2 (cm) = 0.28 (cm) Tissue and other material Viable, Non-Viable, Bone, Skin: Dermis debrided: Level: Skin/Subcutaneous Tissue/Muscle/Bone Debridement Description: Excisional Instrument: Blade Bleeding: Minimum Hemostasis Achieved: Pressure End Time: 15:44 Procedural Pain: 0 Post Procedural Pain: 0 Response to Treatment: Procedure was tolerated well Level of Consciousness: Awake and Alert Post  Debridement Measurements of Total Wound Length: (cm) 1.4 Width: (cm) 0.2 Depth: (cm) 0.2 Volume: (cm) 0.044 Character of Wound/Ulcer Post Debridement: Stable Post Procedure Diagnosis Same as Pre-procedure Electronic Signature(s) Signed: 12/20/2017 6:07:57 PM By: Lawanda Cousins Signed: 12/21/2017 11:36:22 AM By: Roger Shelter Previous Signature: 12/20/2017 5:03:47 PM Version By: Roger Shelter Entered By: Lawanda Cousins on 12/20/2017 18:07:57 Traynham, Lewie Raliegh Ip ([id number]) -------------------------------------------------------------------------------- HPI Details Patient Name: [name]. Date of Service: 12/20/2017 2:45 PM Medical Record Number: [id number] Patient Account Number: [id number] Date of Birth/Sex: Feb 23, 1946 (72 y.o. Male) Treating RN: Roger Shelter Primary Care Provider: Elsie Stain Other Clinician: Referring Provider: Elsie Stain Treating Provider/Extender: Cathie Olden in Treatment: 5 History of Present Illness HPI Description: 11/13/17 patient presents today for initial evaluation and our clinic due to an issue he is having with his left lower extremity in the anterior portion. In 1955 he had a significant accident where he fractured his leg and subsequently had the had reconstructive surgery in this location. Nonetheless he states he has done fairly well over the years that time without any complication speak of. Subsequently when year ago he had some trauma potentially occurred to the site which led to an opening that really has not closed since that time. He really does not know what the trauma was in fact he tells me that he doesn't remember anything in particular it is possible this might've been something that just opened up on its own he really does not know in the end. Nonetheless he's not really had any discomfort or pain although it does appear that there is bone exposed base of the wound. Being on the anterior shin overlying the tibia  and with the thin tissue in this area anyway this is always going to be a difficult place to deal. The ulcer itself seems to have opened up along the surgical incision site. 11/19/17 on evaluation today I did have patient's culture available for review and it did appear that his culture was negative for any bacteria just normal skin flora was noted. This  makes me think that the fragment of bone which was lifting off was actually not infected but rather just flaking because the bone was to dry with the dressings that were place. Nonetheless I do think he's doing much better today the bone seems to be staying much more moist and I think is better quality overall. In general I'm very happy with the overall appearance of the wound today compared to previous there's little bit of erythema there may be just slight cellulitis surrounding the wound bed this was discussed with the patient. He does have doxycycline 100 mg on hand already which I think he can take and he is going to go ahead and start doing this. His MRI is scheduled for the 10th of this month and five days. 12/03/17 on evaluation today patient appears to be doing rather well in regard to his lower Trinity ulcer. He really has not showed much in the way of improvement in regard to the overall size of the wound. With that being said he still have bone noted in the base of the wound there's really no granulation at this point. I still think the Theraskin would be the ideal thing for him. He did have the MRI of his lower extremity which showed posttraumatic deformities of the left tibia and fibula. There is no evidence of ongoing osteomyelitis, abscess, or soft tissue inflammation which was good news upset do not think there's any evidence of significant infection at this point. 12/10/17 on evaluation today patient presents for follow-up concerning his ongoing left anterior lower extremity ulcer. The good news is his wound does not appear to be any  larger today upon evaluation. The unfortunate news is that he continues to really show no signs of improvement in fact the wound actually appears to be essentially unchanged in size today compared to my valuation last week. Again we were having to go forward with additional wound care in order to ensure that there was no progress or change with traditional wound care measures prior to being able to resubmit for the Theraskin which I think would be most appropriate thing for him at this time. Nonetheless based on the fact that we really had no change in the overall wound size or condition over the past month that I have been caring for him since 11/13/17 I think that is definitely appropriate for [place] to proceed with the Theraskin approval. 12/20/17- He is here for follow up evaluation for a left anterior leg. There is noted increased erythema when compared to photo documentation and per patient/spouse report. There is concern in placing theraskin with questionable infection; culture was taken. Theraskin was not place today, he will continue with current treatment plan and he will follow up next week. Electronic Signature(s) Signed: 12/24/2017 9:15:40 PM By: Lawanda Cousins Previous Signature: 12/20/2017 6:11:58 PM Version By: Lawanda Cousins Entered By: Lawanda Cousins on 12/24/2017 21:15:40 [name] ([id number]) -------------------------------------------------------------------------------- Physician Orders Details Patient Name: [name] Date of Service: 12/20/2017 2:45 PM Medical Record Number: [id number] Patient Account Number: [id number] Date of Birth/Sex: 1946-01-12 (72 y.o. Male) Treating RN: Roger Shelter Primary Care Provider: Elsie Stain Other Clinician: Referring Provider: Elsie Stain Treating Provider/Extender: Cathie Olden in Treatment: 5 Verbal / Phone Orders: No Diagnosis Coding Wound Cleansing Wound #2 Left,Medial Lower Leg o Clean wound with Normal  Saline. Anesthetic (add to Medication List) Wound #2 Left,Medial Lower Leg o Topical Lidocaine 4% cream applied to wound bed prior to debridement (In Clinic Only). Skin  Barriers/Peri-Wound Care Wound #2 Left,Medial Lower Leg o Skin Prep Primary Wound Dressing Wound #2 Left,Medial Lower Leg o Xeroform - with hydrogel Secondary Dressing Wound #2 Left,Medial Lower Leg o Boardered Foam Dressing Dressing Change Frequency Wound #2 Left,Medial Lower Leg o Three times weekly Follow-up Appointments Wound #2 Left,Medial Lower Leg o Other: - return next Monday for theraskin application Edema Control Wound #2 Left,Medial Lower Leg o Elevate legs to the level of the heart and pump ankles as often as possible Additional Orders / Instructions Wound #2 Left,Medial Lower Leg o Vitamin A; Vitamin C, Zinc - Multivitamin ok Laboratory o Bacteria identified in Wound by Culture (MICRO) - right lower leg oooo LOINC Code: 6462-6 HEITOR, STEINHOFF ([id number]) oooo Convenience Name: Wound culture routine Electronic Signature(s) Signed: 12/20/2017 5:03:47 PM By: Roger Shelter Signed: 12/20/2017 6:16:13 PM By: Lawanda Cousins Entered By: Roger Shelter on 12/20/2017 15:50:06 Mcdaris, [name] ([id number]) -------------------------------------------------------------------------------- Problem List Details Patient Name: [name]. Date of Service: 12/20/2017 2:45 PM Medical Record Number: [id number] Patient Account Number: [id number] Date of Birth/Sex: 06-Feb-1946 (72 y.o. 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 Date of Service: 12/20/2017 2:45 PM Medical Record Number: [id number] Patient Account Number: [id number] Date of Birth/Sex: 1945/05/19 (72 y.o. Male) Treating RN: Roger Shelter Primary Care Provider: Elsie Stain Other Clinician: Referring Provider: Elsie Stain Treating Provider/Extender: Cathie Olden in Treatment: 5 Subjective Chief Complaint Information obtained from Patient Left leg ulcer History of Present Illness (HPI) 11/13/17 patient presents today for initial evaluation and our clinic due to an issue he is having with his left lower extremity in the anterior portion. In 1955 he had a significant accident where he fractured his leg and subsequently had the had reconstructive surgery in this location. Nonetheless he states he has done fairly well over the years that time without any complication speak of. Subsequently when year ago he had some trauma potentially occurred to the site which led to an opening that really has not closed since that time. He really does not know what the trauma was in fact he tells me that he doesn't remember anything in particular it is possible this might've been something that just opened up on its own he really does not know in the end. Nonetheless he's not really had any discomfort or pain although it does appear that there is bone exposed base of the wound. Being on the anterior shin overlying the tibia and with the thin tissue in this area anyway this is always going to be a  difficult place to deal. The ulcer itself seems to have opened up along the surgical incision site. 11/19/17 on evaluation today I did have patient's culture available for review and it did appear that his culture was negative for any bacteria just normal skin flora was noted. This makes me think that the fragment of bone which was lifting off was actually not infected but  rather just flaking because the bone was to dry with the dressings that were place. Nonetheless I do think he's doing much better today the bone seems to be staying much more moist and I think is better quality overall. In general I'm very happy with the overall appearance of the wound today compared to previous there's little bit of erythema there may be just slight cellulitis surrounding the wound bed this was discussed with the patient. He does have doxycycline 100 mg on hand already which I think he can take and he is going to go ahead and start doing this. His MRI is scheduled for the 10th of this month and five days. 12/03/17 on evaluation today patient appears to be doing rather well in regard to his lower Trinity ulcer. He really has not showed much in the way of improvement in regard to the overall size of the wound. With that being said he still have bone noted in the base of the wound there's really no granulation at this point. I still think the Theraskin would be the ideal thing for him. He did have the MRI of his lower extremity which showed posttraumatic deformities of the left tibia and fibula. There is no evidence of ongoing osteomyelitis, abscess, or soft tissue inflammation which was good news upset do not think there's any evidence of significant infection at this point. 12/10/17 on evaluation today patient presents for follow-up concerning his ongoing left anterior lower extremity ulcer. The good news is his wound does not appear to be any larger today upon evaluation. The unfortunate news is that he continues  to really show no signs of improvement in fact the wound actually appears to be essentially unchanged in size today compared to my valuation last week. Again we were having to go forward with additional wound care in order to ensure that there was no progress or change with traditional wound care measures prior to being able to resubmit for the Theraskin which I think would be most appropriate thing for him at this time. Nonetheless based on the fact that we really had no change in the overall wound size or condition over the past month that I have been caring for him since 11/13/17 I think that is definitely appropriate for [place] to proceed with the Theraskin approval. 12/20/17- He is here for follow up evaluation for a left anterior leg. There is noted increased erythema when compared to photo documentation and per patient/spouse report. There is concern in placing theraskin with questionable infection; culture was taken. Theraskin was not place today, he will continue with current treatment plan and he will follow up next week. JULIUS, MATUS ([id number]) Objective Constitutional Vitals Time Taken: 2:45 PM, Height: 74 in, Weight: 220 lbs, BMI: 28.2, Temperature: 98.1 F, Pulse: 90 bpm, Respiratory Rate: 18 breaths/min, Blood Pressure: 137/79 mmHg. Integumentary (Hair, Skin) Wound #2 status is Open. Original cause of wound was Trauma. The wound is located on the Left,Medial Lower Leg. The wound measures 1.4cm length x 0.2cm width x 0.2cm depth; 0.22cm^2 area and 0.044cm^3 volume. There is bone exposed. There is no undermining noted, however, there is tunneling at 6:00 with a maximum distance of 0.5cm. There is a medium amount of serosanguineous drainage noted. The wound margin is flat and intact. There is no granulation within the wound bed. There is a large (67-100%) amount of necrotic tissue within the wound bed including Adherent Slough. The periwound skin appearance exhibited: Scarring,  Dry/Scaly, Erythema. The periwound skin appearance  did not exhibit: Callus, Crepitus, Excoriation, Induration, Rash, Maceration, Atrophie Blanche, Cyanosis, Ecchymosis, Hemosiderin Staining, Mottled, Pallor, Rubor. The surrounding wound skin color is noted with erythema which is circumferential. Periwound temperature was noted as No Abnormality. The periwound has tenderness on palpation. 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 There was a Excisional Skin/Subcutaneous Tissue/Muscle/Bone Debridement with a total area of 0.28 sq cm performed by Lawanda Cousins, NP. With the following instrument(s): Blade to remove Viable and Non-Viable tissue/material. Material removed includes Bone,Skin: Dermis and after achieving pain control using Other (lidocaine 4%). No specimens were taken. A time out was conducted at 15:43, prior to the start of the procedure. A Minimum amount of bleeding was controlled with Pressure. The procedure was tolerated well with a pain level of 0 throughout and a pain level of 0 following the procedure. Patient s Level of Consciousness post procedure was recorded as Awake and Alert. Post Debridement Measurements: 1.4cm length x 0.2cm width x 0.2cm depth; 0.044cm^3 volume. Character of Wound/Ulcer Post Debridement is stable. Post procedure Diagnosis Wound #2: Same as Pre-Procedure Accardi, Tiandre K. ([id number]) Plan Wound Cleansing: Wound #2 Left,Medial Lower Leg: Clean wound with Normal Saline. Anesthetic (add to Medication List): Wound #2 Left,Medial Lower Leg: Topical Lidocaine 4% cream applied to wound bed prior to debridement (In Clinic Only). Skin Barriers/Peri-Wound  Care: Wound #2 Left,Medial Lower Leg: Skin Prep Primary Wound Dressing: Wound #2 Left,Medial Lower Leg: Xeroform - with hydrogel Secondary Dressing: Wound #2 Left,Medial Lower Leg: Boardered Foam Dressing Dressing Change Frequency: Wound #2 Left,Medial Lower Leg: Three times weekly Follow-up Appointments: Wound #2 Left,Medial Lower Leg: Other: - return next Monday for theraskin application Edema Control: Wound #2 Left,Medial Lower Leg: Elevate legs to the level of the heart and pump ankles as often as possible Additional Orders / Instructions: Wound #2 Left,Medial Lower Leg: Vitamin A; Vitamin C, Zinc - Multivitamin ok Laboratory ordered were: Wound culture routine - right lower leg Electronic Signature(s) Signed: 12/24/2017 9:16:06 PM By: Lawanda Cousins Previous Signature: 12/20/2017 6:14:11 PM Version By: Lawanda Cousins Entered By: Lawanda Cousins on 12/24/2017 21:16:06 [name] ([id number]) -------------------------------------------------------------------------------- SuperBill Details Patient Name: [name]. Date of Service: 12/20/2017 Medical Record Number: [id number] Patient Account Number: [id number] Date of Birth/Sex: [birth year] (72 y.o. 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 L97.816 necrosis I10 Essential (primary) hypertension Facility Procedures CPT4: Description Modifier Quantity Code [id number] 11044 - DEB BONE 20 SQ CM/< 1 ICD-10 Diagnosis Description S81.801S Unspecified open wound, right lower leg, sequela L97.816 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   involvement without evidence of necrosis Physician Procedures CPT4: Description Modifier Quantity Code [id number] Debridement; bone (includes epidermis, dermis, subQ tissue, muscle and/or fascia, if 1 performed) 1st 20 sqcm or less ICD-10 Diagnosis Description S81.801S Unspecified open wound, right lower leg, sequela  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

## 2017-12-24 ENCOUNTER — Ambulatory Visit: Payer: PPO | Admitting: Physician Assistant

## 2017-12-24 NOTE — Progress Notes (Addendum)
QUENTION, MCNEILL ([id number]) Visit Report for 12/20/2017 Arrival Information Details Patient Name: [name], [name]. Date of Service: 12/20/2017 2:45 PM Medical Record Number: [id number] Patient Account Number: [id number] Date of Birth/Sex: 08-11-1945 (72 y.o. Male) Treating RN: Roger Shelter Primary Care Jamarri Vuncannon: Elsie Stain Other Clinician: Referring Jannah Guardiola: Elsie Stain Treating Treyvin Glidden/Extender: Cathie Olden in Treatment: 5 Visit Information History Since Last Visit Added or deleted any medications: No Patient Arrived: Ambulatory Any new allergies or adverse reactions: No Arrival Time: 14:45 Had a fall or experienced change in No Accompanied By: wife activities of daily living that may affect Transfer Assistance: None risk of falls: Patient Requires Transmission-Based No Signs or symptoms of abuse/neglect since last visito No Precautions: Hospitalized since last visit: No Patient Has Alerts: Yes Implantable device outside of the clinic excluding No Patient Alerts: ABI [place] BILATERAL cellular tissue based products placed in the center >220 since last visit: Has Dressing in Place as Prescribed: Yes Pain Present Now: No Electronic Signature(s) Signed: 12/20/2017 4:37:30 PM By: Lorine Bears RCP, RRT, CHT Entered By: Becky Sax, Amado Nash on 12/20/2017 14:46:32 [name] ([id number]) -------------------------------------------------------------------------------- Encounter Discharge Information Details Patient Name: [name]. Date of Service: 12/20/2017 2:45 PM Medical Record Number: [id number] Patient Account Number: [id number] Date of Birth/Sex: 14-Oct-1945 (72 y.o. Male) Treating RN: Cornell Barman Primary Care Shizuye Rupert: Elsie Stain Other Clinician: Referring Kierrah Kilbride: Elsie Stain Treating Emiel Kielty/Extender: Cathie Olden in Treatment: 5 Encounter Discharge Information Items Discharge Condition:  Stable Ambulatory Status: Ambulatory Discharge Destination: Home Transportation: Private Auto Schedule Follow-up Appointment: Yes Clinical Summary of Care: Electronic Signature(s) Signed: 12/20/2017 5:04:06 PM By: Gretta Cool, BSN, RN, CWS, Kim RN, BSN Entered By: Gretta Cool, BSN, RN, CWS, Kim on 12/20/2017 17:04:06 [name] ([id number]) -------------------------------------------------------------------------------- Lower Extremity Assessment Details Patient Name: [name]. Date of Service: 12/20/2017 2:45 PM Medical Record Number: [id number] Patient Account Number: [id number] Date of Birth/Sex: [birth year] (72 y.o. Male) Treating RN: Montey Hora Primary Care Antowan Samford: Elsie Stain Other Clinician: Referring Joie Hipps: Elsie Stain Treating Jaelen Gellerman/Extender: Cathie Olden in Treatment: 5 Vascular Assessment Pulses: Dorsalis Pedis Palpable: [Left:Yes] Posterior Tibial Extremity colors, hair growth, and conditions: Extremity Color: [Left:Normal] Hair Growth on Extremity: [Left:Yes] Temperature of Extremity: [Left:Warm] Capillary Refill: [Left:< 3 seconds] Toe Nail Assessment Left: Right: Thick: Yes Discolored: No Deformed: Yes Improper Length and Hygiene: No Electronic Signature(s) Signed: 12/20/2017 5:08:53 PM By: Montey Hora Entered By: Montey Hora on 12/20/2017 15:08:00 [name] ([id number]) -------------------------------------------------------------------------------- Multi Wound Chart Details Patient Name: [name]. Date of Service: 12/20/2017 2:45 PM Medical Record Number: [id number] Patient Account Number: [id number] Date of Birth/Sex: November 12, 1945 (72 y.o. Male) Treating RN: Roger Shelter Primary Care Deshaun Weisinger: Elsie Stain Other Clinician: Referring Anyla Israelson: Elsie Stain Treating Vessie Olmsted/Extender: Cathie Olden in Treatment: 5 Vital Signs Height(in): 74 Pulse(bpm): 90 Weight(lbs): 220 Blood  Pressure(mmHg): 137/79 Body Mass Index(BMI): 28 Temperature(F): 98.1 Respiratory Rate 18 (breaths/min): Photos: [N/A:N/A] Wound Location: Left Lower Leg - Medial N/A N/A Wounding Event: Trauma N/A N/A Primary Etiology: Trauma, Other N/A N/A Comorbid History: Cataracts, Sleep Apnea, N/A N/A Hypertension, Osteoarthritis Date Acquired: 11/13/2016 N/A N/A Weeks of Treatment: 5 N/A N/A Wound Status: Open N/A N/A Pending Amputation on Yes N/A N/A Presentation: Measurements L x W x D 1.4x0.2x0.2 N/A N/A (cm) Area (cm) : 0.22 N/A N/A Volume (cm) : 0.044 N/A N/A % Reduction in Area: 33.30% N/A N/A % Reduction in Volume: 55.60% N/A N/A Position 1 (o'clock): 6 Maximum Distance 1 (cm): 0.5 Tunneling: Yes N/A  N/A Classification: Full Thickness With Exposed N/A N/A Support Structures Exudate Amount: Medium N/A N/A Exudate Type: Serosanguineous N/A N/A Exudate Color: red, brown N/A N/A Wound Margin: Flat and Intact N/A N/A Granulation Amount: None Present (0%) N/A N/A Necrotic Amount: Large (67-100%) N/A N/A Exposed Structures: Bone: Yes N/A N/A Fascia: No Grantz, Alma K. ([id number]) Fat Layer (Subcutaneous Tissue) Exposed: No Tendon: No Muscle: No Joint: No Epithelialization: None N/A N/A Debridement: Debridement - Selective/Open N/A N/A Wound Pre-procedure 15:43 N/A N/A Verification/Time Out Taken: Pain Control: Other N/A N/A Level: Non-Viable Tissue N/A N/A Debridement Area (sq cm): 0.28 N/A N/A Instrument: Blade N/A N/A Bleeding: Minimum N/A N/A Hemostasis Achieved: Pressure N/A N/A Procedural Pain: 0 N/A N/A Post Procedural Pain: 0 N/A N/A Debridement Treatment Procedure was tolerated well N/A N/A Response: Post Debridement 1.4x0.2x0.2 N/A N/A Measurements L x W x D (cm) Post Debridement Volume: 0.044 N/A N/A (cm) Periwound Skin Texture: Scarring: Yes N/A N/A Excoriation: No Induration: No Callus: No Crepitus: No Rash: No Periwound Skin  Moisture: Dry/Scaly: Yes N/A N/A Maceration: No Periwound Skin Color: Erythema: Yes N/A N/A Atrophie Blanche: No Cyanosis: No Ecchymosis: No Hemosiderin Staining: No Mottled: No Pallor: No Rubor: No Erythema Location: Circumferential N/A N/A Temperature: No Abnormality N/A N/A Tenderness on Palpation: Yes N/A N/A Wound Preparation: Ulcer Cleansing: N/A N/A Rinsed/Irrigated with Saline Topical Anesthetic Applied: Other: lidocaine 4% Procedures Performed: Debridement N/A N/A Treatment Notes Wound #2 (Left, Medial Lower Leg) Notes hydrogel under xeroform TAYE, CATO ([id number]) Electronic Signature(s) Signed: 12/20/2017 6:07:01 PM By: Lawanda Cousins Previous Signature: 12/20/2017 5:03:47 PM Version By: Roger Shelter Entered By: Lawanda Cousins on 12/20/2017 18:07:01 [name] ([id number]) -------------------------------------------------------------------------------- [place] Details Patient Name: [name]. Date of Service: 12/20/2017 2:45 PM Medical Record Number: [id number] Patient Account Number: [id number] Date of Birth/Sex: 21-Feb-1946 (72 y.o. Male) Treating RN: Roger Shelter Primary Care Karthika Glasper: Elsie Stain Other Clinician: Referring Alisea Matte: Elsie Stain Treating Javious Hallisey/Extender: Cathie Olden in Treatment: 5 Active Inactive ` Necrotic Tissue Nursing Diagnoses: Knowledge deficit related to management of necrotic/devitalized tissue Goals: Necrotic/devitalized tissue will be minimized in the wound bed Date Initiated: 11/13/2017 Target Resolution Date: 11/16/2017 Goal Status: Active Interventions: Provide education on necrotic tissue and debridement process Treatment Activities: Apply topical anesthetic as ordered : 11/13/2017 Notes: ` Orientation to the Wound Care Program Nursing Diagnoses: Knowledge deficit related to the wound healing center program Goals: Patient/caregiver will verbalize  understanding of the [place] Program Date Initiated: 11/13/2017 Target Resolution Date: 11/16/2017 Goal Status: Active Interventions: Provide education on orientation to the wound center Notes: ` Wound/Skin Impairment Nursing Diagnoses: Impaired tissue integrity Goals: Ulcer/skin breakdown will have a volume reduction of 30% by week 4 Date Initiated: 11/13/2017 Target Resolution Date: 12/14/2017 JACEON, HEIBERGER ([id number]) Goal Status: Active Interventions: Assess patient/caregiver ability to obtain necessary supplies Treatment Activities: Skin care regimen initiated : 11/13/2017 Notes: Electronic Signature(s) Signed: 12/20/2017 5:03:47 PM By: Roger Shelter Entered By: Roger Shelter on 12/20/2017 15:19:51 [place], [name] ([id number]) -------------------------------------------------------------------------------- Pain Assessment Details Patient Name: [name]. Date of Service: 12/20/2017 2:45 PM Medical Record Number: [id number] Patient Account Number: [id number] Date of Birth/Sex: 1945/07/10 (72 y.o. Male) Treating RN: Roger Shelter Primary Care Emalie Mcwethy: Elsie Stain Other Clinician: Referring Morrison Mcbryar: Elsie Stain Treating Amarie Viles/Extender: Cathie Olden in Treatment: 5 Active Problems Location of Pain Severity and Description of Pain Patient Has Paino No Site Locations Pain Management and Medication Current Pain Management: Electronic Signature(s) Signed: 12/20/2017 4:37:30 PM  By: Lorine Bears RCP, RRT, CHT Signed: 12/20/2017 5:03:47 PM By: Roger Shelter Entered By: Lorine Bears on 12/20/2017 14:46:39 [name] ([id number]) -------------------------------------------------------------------------------- Patient/Caregiver Education Details Patient Name: [name] Date of Service: 12/20/2017 2:45 PM Medical Record Number: [id number] Patient Account Number: [id number] Date of  Birth/Gender: February 06, 1946 (72 y.o. Male) Treating RN: Cornell Barman Primary Care Physician: Elsie Stain Other Clinician: Referring Physician: Elsie Stain Treating Physician/Extender: Cathie Olden in Treatment: 5 Education Assessment Education Provided To: Patient Education Topics Provided Wound/Skin Impairment: Handouts: Caring for Your Ulcer Methods: Demonstration, Explain/Verbal Responses: State content correctly Electronic Signature(s) Signed: 12/20/2017 5:22:10 PM By: Gretta Cool, BSN, RN, CWS, Kim RN, BSN Entered By: Gretta Cool, BSN, RN, CWS, Kim on 12/20/2017 17:04:16 [name] ([id number]) -------------------------------------------------------------------------------- Wound Assessment Details Patient Name: PRANAV, LINCE. Date of Service: 12/20/2017 2:45 PM Medical Record Number: [id number] Patient Account Number: [id number] Date of Birth/Sex: 1946-01-29 (72 y.o. Male) Treating RN: Montey Hora Primary Care Makaylin Carlo: Elsie Stain Other Clinician: Referring Lamiracle Chaidez: Elsie Stain Treating Taelyn Nemes/Extender: Cathie Olden in Treatment: 5 Wound Status Wound Number: 2 Primary Trauma, Other Etiology: Wound Location: Left Lower Leg - Medial Wound Status: Open Wounding Event: Trauma Comorbid Cataracts, Sleep Apnea, Hypertension, Date Acquired: 11/13/2016 History: Osteoarthritis Weeks Of Treatment: 5 Clustered Wound: No Pending Amputation On Presentation Photos Photo Uploaded By: Montey Hora on 12/20/2017 15:38:45 Wound Measurements Length: (cm) 1.4 Width: (cm) 0.2 Depth: (cm) 0.2 Area: (cm) 0.22 Volume: (cm) 0.044 % Reduction in Area: 33.3% % Reduction in Volume: 55.6% Epithelialization: None Tunneling: Yes Position (o'clock): 6 Maximum Distance: (cm) 0.5 Undermining: No Wound Description Full Thickness With Exposed Support Foul O Classification: Structures Slough Wound Margin: Flat and Intact Exudate Medium Amount: Exudate Type:  Serosanguineous Exudate Color: red, brown dor After Cleansing: No /Fibrino Yes Wound Bed Granulation Amount: None Present (0%) Exposed Structure Necrotic Amount: Large (67-100%) Fascia Exposed: No Necrotic Quality: Adherent Slough Fat Layer (Subcutaneous Tissue) Exposed: No Croslin, Odas K. ([id number]) Tendon Exposed: No Muscle Exposed: No Joint Exposed: No Bone Exposed: Yes Periwound Skin Texture Texture Color No Abnormalities Noted: No No Abnormalities Noted: No Callus: No Atrophie Blanche: No Crepitus: No Cyanosis: No Excoriation: No Ecchymosis: No Induration: No Erythema: Yes Rash: No Erythema Location: Circumferential Scarring: Yes Hemosiderin Staining: No Mottled: No Moisture Pallor: No No Abnormalities Noted: No Rubor: No Dry / Scaly: Yes Maceration: No Temperature / Pain Temperature: No Abnormality Tenderness on Palpation: Yes Wound Preparation Ulcer Cleansing: Rinsed/Irrigated with Saline Topical Anesthetic Applied: Other: lidocaine 4%, Treatment Notes Wound #2 (Left, Medial Lower Leg) Notes hydrogel under xeroform Electronic Signature(s) Signed: 12/20/2017 5:08:53 PM By: Montey Hora Entered By: Montey Hora on 12/20/2017 15:06:51 [name] ([id number]) -------------------------------------------------------------------------------- [place] Details Patient Name: [name]. Date of Service: 12/20/2017 2:45 PM Medical Record Number: [id number] Patient Account Number: [id number] Date of Birth/Sex: July 02, 1945 (72 y.o. Male) Treating RN: Roger Shelter Primary Care Arihaan Bellucci: Elsie Stain Other Clinician: Referring Jaquail Mclees: Elsie Stain Treating Ronie Barnhart/Extender: Cathie Olden in Treatment: 5 Vital Signs Time Taken: 14:45 Temperature (F): 98.1 Height (in): 74 Pulse (bpm): 90 Weight (lbs): 220 Respiratory Rate (breaths/min): 18 Body Mass Index (BMI): 28.2 Blood Pressure (mmHg): 137/79 Reference Range: 80 -  120 mg / dl Electronic Signature(s) Signed: 12/20/2017 4:37:30 PM By: Lorine Bears RCP, RRT, CHT Entered By: Becky Sax, Amado Nash on 12/20/2017 14:49:02

## 2017-12-27 ENCOUNTER — Encounter: Payer: PPO | Admitting: Physician Assistant

## 2017-12-27 DIAGNOSIS — L97816 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 involvement without evidence of necrosis: Secondary | ICD-10-CM | POA: Diagnosis not present

## 2017-12-27 DIAGNOSIS — L97822 Non-pressure chronic ulcer of other part of left lower leg with fat layer exposed: Secondary | ICD-10-CM | POA: Diagnosis not present

## 2017-12-31 ENCOUNTER — Ambulatory Visit: Payer: PPO | Admitting: Physician Assistant

## 2018-01-01 NOTE — Progress Notes (Signed)
AMARRION, PASTORINO ([id number]) Visit Report for 12/27/2017 Chief Complaint Document Details Patient Name: [name], [name]. Date of Service: 12/27/2017 12:45 PM Medical Record Number: [id number] Patient Account Number: [id number] Date of Birth/Sex: 03/08/1946 (72 y.o. M) Treating RN: Roger Shelter Primary Care Provider: Elsie Stain Other Clinician: Referring Provider: Elsie Stain Treating Provider/Extender: Melburn Hake, HOYT Weeks in Treatment: 6 Information Obtained from: Patient Chief Complaint Left leg ulcer Electronic Signature(s) Signed: 12/28/2017 8:54:03 AM By: Worthy Keeler PA-C Entered By: Worthy Keeler on 12/27/2017 13:11:26 [name] ([id number]) -------------------------------------------------------------------------------- Cellular or Tissue Based Product Details Patient Name: [name], [name]. Date of Service: 12/27/2017 12:45 PM Medical Record Number: [id number] Patient Account Number: [id number] Date of Birth/Sex: 12-Dec-1945 (72 y.o. M) Treating RN: Roger Shelter Primary Care Provider: Elsie Stain Other Clinician: Referring Provider: Elsie Stain Treating Provider/Extender: Melburn Hake, HOYT Weeks in Treatment: 6 Cellular or Tissue Based Wound #2 Left,Medial Lower Leg Product Type Applied to: Performed By: Physician Tyler III, HOYT E., PA-C Cellular or Tissue Based Theraskin Product Type: Pre-procedure Verification/Time Yes - 13:24 Out Taken: Location: trunk / arms / legs Wound Size (sq cm): 0.28 Product Size (sq cm): 6.5 Waste Size (sq cm): 6.22 Waste Reason: wound size Amount of Product Applied (sq cm): 0.28 Lot #: [id number] Expiration Date: 10/29/2020 Fenestrated: Yes Reconstituted: Yes Solution Type: normal saline Solution Amount: 34ml Lot #: [id number] Solution Expiration Date: 08/16/2019 Secured: Yes Secured With: Steri-Strips Dressing Applied: Yes Primary Dressing: mepitel Procedural Pain: 0 Post Procedural Pain:  0 Response to Treatment: Procedure was tolerated well Level of Consciousness: Awake and Alert Post Procedure Diagnosis Same as Pre-procedure Electronic Signature(s) Signed: 12/28/2017 8:12:43 AM By: Roger Shelter Entered By: Roger Shelter on 12/27/2017 13:32:54 [name] ([id number]) -------------------------------------------------------------------------------- Debridement Details Patient Name: [name]. Date of Service: 12/27/2017 12:45 PM Medical Record Number: [id number] Patient Account Number: [id number] Date of Birth/Sex: 03/05/1946 (72 y.o. M) Treating RN: Roger Shelter Primary Care Provider: Elsie Stain Other Clinician: Referring Provider: Elsie Stain Treating Provider/Extender: Melburn Hake, HOYT Weeks in Treatment: 6 Debridement Performed for Wound #2 Left,Medial Lower Leg Assessment: Performed By: Physician Tyler III, HOYT E., PA-C Debridement Type: Debridement Pre-procedure Verification/Time Yes - 13:19 Out Taken: Start Time: 13:19 Pain Control: Other : lidocaine 4% Total Area Debrided (L x W): 1.4 (cm) x 0.2 (cm) = 0.28 (cm) Tissue and other material Viable, Non-Viable, Slough, Subcutaneous, Biofilm, Slough debrided: Level: Skin/Subcutaneous Tissue Debridement Description: Excisional Instrument: Curette Bleeding: Minimum Hemostasis Achieved: Pressure End Time: 13:20 Procedural Pain: 0 Post Procedural Pain: 0 Response to Treatment: Procedure was tolerated well Level of Consciousness: Awake and Alert Post Debridement Measurements of Total Wound Length: (cm) 1.4 Width: (cm) 0.2 Depth: (cm) 0.3 Volume: (cm) 0.066 Character of Wound/Ulcer Post Debridement: Stable Post Procedure Diagnosis Same as Pre-procedure Electronic Signature(s) Signed: 12/28/2017 8:12:43 AM By: Roger Shelter Signed: 12/28/2017 8:54:03 AM By: Worthy Keeler PA-C Entered By: Roger Shelter on 12/27/2017 13:20:02 [name]  ([id number]) -------------------------------------------------------------------------------- HPI Details Patient Name: [name]. Date of Service: 12/27/2017 12:45 PM Medical Record Number: [id number] Patient Account Number: [id number] Date of Birth/Sex: 08/19/1945 (72 y.o. M) Treating RN: Roger Shelter Primary Care Provider: Elsie Stain Other Clinician: Referring Provider: Elsie Stain Treating Provider/Extender: Melburn Hake, HOYT Weeks in Treatment: 6 History of Present Illness HPI Description: 11/13/17 patient presents today for initial evaluation and our clinic due to an issue he is having with his left lower extremity in the anterior portion. In 1955 he had a  significant accident where he fractured his leg and subsequently had the had reconstructive surgery in this location. Nonetheless he states he has done fairly well over the years that time without any complication speak of. Subsequently when year ago he had some trauma potentially occurred to the site which led to an opening that really has not closed since that time. He really does not know what the trauma was in fact he tells me that he doesn't remember anything in particular it is possible this might've been something that just opened up on its own he really does not know in the end. Nonetheless he's not really had any discomfort or pain although it does appear that there is bone exposed base of the wound. Being on the anterior shin overlying the tibia and with the thin tissue in this area anyway this is always going to be a difficult place to deal. The ulcer itself seems to have opened up along the surgical incision site. 11/19/17 on evaluation today I did have patient's culture available for review and it did appear that his culture was negative for any bacteria just normal skin flora was noted. This makes me think that the fragment of bone which was lifting off was actually not infected but rather just flaking because  the bone was to dry with the dressings that were place. Nonetheless I do think he's doing much better today the bone seems to be staying much more moist and I think is better quality overall. In general I'm very happy with the overall appearance of the wound today compared to previous there's little bit of erythema there may be just slight cellulitis surrounding the wound bed this was discussed with the patient. He does have doxycycline 100 mg on hand already which I think he can take and he is going to go ahead and start doing this. His MRI is scheduled for the 10th of this month and five days. 12/03/17 on evaluation today patient appears to be doing rather well in regard to his lower Trinity ulcer. He really has not showed much in the way of improvement in regard to the overall size of the wound. With that being said he still have bone noted in the base of the wound there's really no granulation at this point. I still think the Theraskin would be the ideal thing for him. He did have the MRI of his lower extremity which showed posttraumatic deformities of the left tibia and fibula. There is no evidence of ongoing osteomyelitis, abscess, or soft tissue inflammation which was good news upset do not think there's any evidence of significant infection at this point. 12/10/17 on evaluation today patient presents for follow-up concerning his ongoing left anterior lower extremity ulcer. The good news is his wound does not appear to be any larger today upon evaluation. The unfortunate news is that he continues to really show no signs of improvement in fact the wound actually appears to be essentially unchanged in size today compared to my valuation last week. Again we were having to go forward with additional wound care in order to ensure that there was no progress or change with traditional wound care measures prior to being able to resubmit for the Theraskin which I think would be most appropriate thing  for him at this time. Nonetheless based on the fact that we really had no change in the overall wound size or condition over the past month that I have been caring for him since 11/13/17 I think  that is definitely appropriate for [place] to proceed with the Theraskin approval. 12/20/17- He is here for follow up evaluation for a left anterior leg. There is noted increased erythema when compared to photo documentation and per patient/spouse report. There is concern in placing theraskin with questionable infection; culture was taken. Theraskin was not place today, he will continue with current treatment plan and he will follow up next week. 12/27/17 on evaluation today patient actually appears to be doing very well in regard to his wound. There's really no change but again I do not see the erythema that characterized last week's evaluation. In general I feel like things are showing signs of improvement. At this point my hope is that the patient will be able to have the [place] in place between now and when I see him back in roughly 2 1/2 weeks time. In that time I'm hoping that the skin graft will actually be able to take to some degree we may need to repeat this I'll be see that was explained to the patient as well. Nonetheless he is hopeful that this will be of benefit and I agree that this is probably his best chance of trying to get this thing to heal. He knows how to take care of this including keeping it clean and dry as well. [name], [name] ([id number]) Electronic Signature(s) Signed: 12/28/2017 8:54:03 AM By: Worthy Keeler PA-C Entered By: Worthy Keeler on 12/27/2017 13:54:20 [name] ([id number]) -------------------------------------------------------------------------------- Physical Exam Details Patient Name: [name] Date of Service: 12/27/2017 12:45 PM Medical Record Number: [id number] Patient Account Number: [id number] Date of Birth/Sex: 1946-03-17 (72 y.o.  M) Treating RN: Roger Shelter Primary Care Provider: Elsie Stain Other Clinician: Referring Provider: Elsie Stain Treating Provider/Extender: Tyler III, HOYT Weeks in Treatment: 6 Constitutional Well-nourished and well-hydrated in no acute distress. Respiratory normal breathing without difficulty. Psychiatric this patient is able to make decisions and demonstrates good insight into disease process. Alert and Oriented x 3. pleasant and cooperative. Notes Patient's wound bed at this time shows evidence of bone noted in the base of the wound again this is not osteomyelitis although that was a concern in the beginning. Nonetheless we are having trouble getting any epithelialization overlying the bone I'm hopeful that the Theraskin will be extremely helpful in this regard. I did in order to prepare the area sharply debride away some of the rolled edges of the skin in order to hopefully allow for the best contact with the wound bed for the Theraskin. The patient tolerated this with no pain post debridement hemostasis was achieved before application of the Theraskin. This was secured with Steri-Strips and then covered over with a mepitel and finally this was also secured with Steri-Strips. Subsequently once this was complete I did advise the patient that anything from this point forward should not be removed until follow-up in less absolutely necessary after the two week mark. He understands. Nonetheless if they have any trouble or any issues he knows he can definitely contact my office for additional recommendations. I explained that the outer dressing can be changed. Electronic Signature(s) Signed: 12/28/2017 8:54:03 AM By: Worthy Keeler PA-C Entered By: Worthy Keeler on 12/27/2017 13:56:04 [name] ([id number]) -------------------------------------------------------------------------------- Physician Orders Details Patient Name: [name] Date of Service:  12/27/2017 12:45 PM Medical Record Number: [id number] Patient Account Number: [id number] Date of Birth/Sex: [birth year] (72 y.o. 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 Anesthetic (add to Medication List) Wound #2 Left,Medial Lower Leg o Topical Lidocaine 4% cream applied to wound bed prior to debridement (In Clinic Only). Secondary Dressing Wound #2 Left,Medial Lower Leg o Dry Gauze - 2x2 over dressing o Mepitel One or equivalent - framed with steri strips to hold theraskin in place Dressing Change Frequency Wound #2 Left,Medial Lower Leg o Other: - change in 2 weeks Follow-up Appointments Wound #2 Left,Medial Lower Leg o Return Appointment in 2 weeks. Advanced Therapies o Theraskin application in clinic; including contact layer, fixation with steri strips, dry gauze and cover dressing. Electronic Signature(s) Signed: 12/28/2017 8:12:43 AM By: Roger Shelter Signed: 12/28/2017 8:54:03 AM By: Worthy Keeler PA-C Entered By: Roger Shelter on 12/27/2017 13:39:11 Kayes, Carma Hayden ([id number]) -------------------------------------------------------------------------------- Problem List Details Patient Name: [name], [name]. Date of Service: 12/27/2017 12:45 PM Medical Record Number: [id number] Patient Account Number: [id number] Date of Birth/Sex: 1945/07/23 (72 y.o. M) Treating RN: Roger Shelter Primary Care Provider: Elsie Stain Other  Clinician: Referring Provider: Elsie Stain Treating Provider/Extender: Melburn Hake, HOYT Weeks in Treatment: 6 Active Problems ICD-10 Evaluated Encounter Code Description Active Date Today Diagnosis S81.801S Unspecified open wound, right lower leg, sequela 11/13/2017 No Yes L97.816 Non-pressure chronic ulcer of other part of right lower leg 11/13/2017 No Yes with bone involvement without evidence of necrosis I10 Essential (primary) hypertension 11/13/2017 No Yes Inactive Problems Resolved Problems Electronic Signature(s) Signed: 12/28/2017 8:54:03 AM By: Worthy Keeler PA-C Entered By: Worthy Keeler on 12/27/2017 13:11:20 [name] ([id number]) -------------------------------------------------------------------------------- Progress Note Details Patient Name: [name]. Date of Service: 12/27/2017 12:45 PM Medical Record Number: [id number] Patient Account Number: [id number] Date of Birth/Sex: 1945-10-20 (72 y.o. M) Treating RN: Roger Shelter Primary Care Provider: Elsie Stain Other Clinician: Referring Provider: Elsie Stain Treating Provider/Extender: Melburn Hake, HOYT Weeks in Treatment: 6 Subjective Chief Complaint Information obtained from Patient Left leg ulcer History of Present Illness (HPI) 11/13/17 patient presents today for initial evaluation and our clinic due to an issue he is having with his left lower extremity in the anterior portion. In 1955 he had a significant accident where he fractured his leg and subsequently had the had reconstructive surgery in this location. Nonetheless he states he has done fairly well over the years that time without any complication speak of. Subsequently when year ago he had some trauma potentially occurred to the site which led to an opening that really has not closed since that time. He really does not know what the trauma was in fact he tells me that he doesn't remember anything in particular it is possible  this might've been something that just opened up on its own he really does not know in the end. Nonetheless he's not really had any discomfort or pain although it does appear that there is bone exposed base of the wound. Being on the anterior shin overlying the tibia and with the thin tissue in this area anyway this is always going to be a difficult place to deal. The ulcer itself seems to have opened up along the surgical incision site. 11/19/17 on evaluation today I did  have patient's culture available for review and it did appear that his culture was negative for any bacteria just normal skin flora was noted. This makes me think that the fragment of bone which was lifting off was actually not infected but rather just flaking because the bone was to dry with the dressings that were place. Nonetheless I do think he's doing much better today the bone seems to be staying much more moist and I think is better quality overall. In general I'm very happy with the overall appearance of the wound today compared to previous there's little bit of erythema there may be just slight cellulitis surrounding the wound bed this was discussed with the patient. He does have doxycycline 100 mg on hand already which I think he can take and he is going to go ahead and start doing this. His MRI is scheduled for the 10th of this month and five days. 12/03/17 on evaluation today patient appears to be doing rather well in regard to his lower Trinity ulcer. He really has not showed much in the way of improvement in regard to the overall size of the wound. With that being said he still have bone noted in the base of the wound there's really no granulation at this point. I still think the Theraskin would be the ideal thing for him. He did have the MRI of his lower extremity which showed posttraumatic deformities of the left tibia and fibula. There is no evidence of ongoing osteomyelitis, abscess, or soft tissue inflammation which  was good news upset do not think there's any evidence of significant infection at this point. 12/10/17 on evaluation today patient presents for follow-up concerning his ongoing left anterior lower extremity ulcer. The good news is his wound does not appear to be any larger today upon evaluation. The unfortunate news is that he continues to really show no signs of improvement in fact the wound actually appears to be essentially unchanged in size today compared to my valuation last week. Again we were having to go forward with additional wound care in order to ensure that there was no progress or change with traditional wound care measures prior to being able to resubmit for the Theraskin which I think would be most appropriate thing for him at this time. Nonetheless based on the fact that we really had no change in the overall wound size or condition over the past month that I have been caring for him since 11/13/17 I think that is definitely appropriate for [place] to proceed with the Theraskin approval. 12/20/17- He is here for follow up evaluation for a left anterior leg. There is noted increased erythema when compared to photo documentation and per patient/spouse report. There is concern in placing theraskin with questionable infection; culture was taken. Theraskin was not place today, he will continue with current treatment plan and he will follow up next week. 12/27/17 on evaluation today patient actually appears to be doing very well in regard to his wound. There's really no change but again I do not see the erythema that characterized last week's evaluation. In general I feel like things are showing signs of [name], Tyler K. ([id number]) improvement. At this point my hope is that the patient will be able to have the [place] in place between now and when I see him back in roughly 2 1/2 weeks time. In that time I'm hoping that the skin graft will actually be able to take to some degree we may need  to  repeat this I'll be see that was explained to the patient as well. Nonetheless he is hopeful that this will be of benefit and I agree that this is probably his best chance of trying to get this thing to heal. He knows how to take care of this including keeping it clean and dry as well. Patient History Information obtained from Patient. Family History Diabetes - Father, Heart Disease - Paternal Grandparents, Hypertension - Mother, Lung Disease - Mother, No family history of Cancer, Hereditary Spherocytosis, Kidney Disease, Seizures, Stroke, Thyroid Problems, Tuberculosis. Social History Never smoker, Marital Status - Married, Alcohol Use - Never, Drug Use - No History, Caffeine Use - Moderate. 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 Respiratory The patient has no complaints or symptoms. Cardiovascular The patient has no complaints or symptoms. Psychiatric The patient has no complaints or symptoms. Objective Constitutional Well-nourished and well-hydrated in no acute distress. Vitals Time Taken: 12:53 PM, Height: 74 in, Weight: 220 lbs, BMI: 28.2, Temperature: 98.2 F, Pulse: 68 bpm, Respiratory Rate: 18 breaths/min, Blood Pressure: 141/79 mmHg. Respiratory normal breathing without difficulty. Psychiatric this patient is able to make decisions and demonstrates good insight into disease process. Alert and Oriented x 3. pleasant and cooperative. General Notes: Patient's wound bed at this time shows evidence of bone noted in the base of the wound again this is not Bale, Rohil K. ([id number]) osteomyelitis although that was a concern in the beginning. Nonetheless we are having trouble getting any epithelialization overlying the bone I'm hopeful that the Theraskin will be extremely helpful in this regard. I did in order to  prepare the area sharply debride away some of the rolled edges of the skin in order to hopefully allow for the best contact with the wound bed for the Theraskin. The patient tolerated this with no pain post debridement hemostasis was achieved before application of the Theraskin. This was secured with Steri-Strips and then covered over with a mepitel and finally this was also secured with Steri-Strips. Subsequently once this was complete I did advise the patient that anything from this point forward should not be removed until follow-up in less absolutely necessary after the two week mark. He understands. Nonetheless if they have any trouble or any issues he knows he can definitely contact my office for additional recommendations. I explained that the outer dressing can be changed. Integumentary (Hair, Skin) Wound #2 status is Open. Original cause of wound was Trauma. The wound is located on the Left,Medial Lower Leg. The wound measures 1.4cm length x 0.2cm width x 0.2cm depth; 0.22cm^2 area and 0.044cm^3 volume. There is bone exposed. There is no tunneling or undermining noted. There is a medium amount of serous drainage noted. The wound margin is flat and intact. There is no granulation within the wound bed. There is a large (67-100%) amount of necrotic tissue within the wound bed including Adherent Slough. The periwound skin appearance exhibited: Scarring, Dry/Scaly, Erythema. The periwound skin appearance did not exhibit: Callus, Crepitus, Excoriation, Induration, Rash, Maceration, Atrophie Blanche, Cyanosis, Ecchymosis, Hemosiderin Staining, Mottled, Pallor, Rubor. The surrounding wound skin color is noted with erythema which is circumferential. Periwound temperature was noted as No Abnormality. The periwound has tenderness on palpation. 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 There was a Excisional Skin/Subcutaneous Tissue Debridement with a total area of 0.28 sq cm performed by Tyler III, HOYT E., PA-C. With the following instrument(s): Curette to remove Viable and Non-Viable tissue/material. Material removed includes Subcutaneous Tissue, Slough, and Biofilm after achieving pain control using Other (lidocaine 4%). No specimens were taken. A time out was conducted at 13:19, prior to the start of the procedure. A Minimum amount of bleeding was controlled with Pressure. The procedure was tolerated well with a pain level of 0 throughout and a pain level of 0 following the procedure. Patient s Level of Consciousness post procedure was recorded as Awake and Alert. Post Debridement Measurements: 1.4cm length x 0.2cm width x 0.3cm depth; 0.066cm^3 volume. Character of Wound/Ulcer Post Debridement is stable. Post procedure Diagnosis Wound #2: Same as Pre-Procedure Pre-procedure diagnosis of Wound #2 is a Trauma, Other located on the Left,Medial Lower Leg. A skin graft procedure using a bioengineered skin substitute/cellular or tissue based product was performed by Tyler III, HOYT E., PA-C. Theraskin was applied and secured with Steri-Strips. 0.28 sq cm of product was utilized and 6.22 sq cm was wasted due to wound size. Post Application, mepitel was applied. A Time Out was conducted at 13:24, prior to the start of the procedure. The procedure was tolerated well with a pain level of 0 throughout and a pain level of 0 following the procedure. Patient s Level of Consciousness [name], Tyler K. ([id number]) post procedure was recorded as Awake and Alert. Post procedure Diagnosis Wound #2: Same as Pre-Procedure . Plan Wound Cleansing: Wound #2 Left,Medial Lower Leg: Clean wound with Normal  Saline. Anesthetic (add to Medication List): Wound #2 Left,Medial Lower Leg: Topical Lidocaine 4% cream applied to wound bed prior to debridement (In Clinic Only). Secondary Dressing: Wound #2 Left,Medial Lower Leg: Dry Gauze - 2x2 over dressing Mepitel One or equivalent - framed with steri strips to hold theraskin in place Dressing Change Frequency: Wound #2 Left,Medial Lower Leg: Other: - change in 2 weeks Follow-up Appointments: Wound #2 Left,Medial Lower Leg: Return Appointment in 2 weeks. Advanced Therapies: Theraskin application in clinic; including contact layer, fixation with steri strips, dry gauze and cover dressing. At this time my suggestion is gonna be that we continue with the above wound care measures over the next 2-2 1/2 weeks. Again my biggest concern has been that the patient was gonna be out of town nonetheless he knows how to take care of this properly and can contact me if you has any concerns. He knows keep this clean and dry including protecting it if he has to shower. Hopefully this will call some granulation and help this area to start healing that's what we're after. If anything changes in the meantime he will let me know. Please see above for specific wound care orders. We will see patient for re-evaluation in 1 week(s) here in the clinic. If anything worsens or changes patient will contact our office for additional recommendations. Electronic Signature(s) Signed: 12/28/2017 8:54:03 AM By: Worthy Keeler PA-C Entered By: Worthy Keeler on 12/27/2017 13:57:06 [name] ([id number]) -------------------------------------------------------------------------------- ROS/PFSH Details Patient Name: [name] Date of Service: 12/27/2017 12:45 PM Medical Record Number: [id number] Patient Account Number: [id number] Date of Birth/Sex: 1946/02/10 (72 y.o. M) Treating RN: Roger Shelter Primary Care Provider: Elsie Stain Other  Clinician: Referring Provider: Elsie Stain Treating Provider/Extender: Tyler III, HOYT Weeks in  Treatment: 6 Information Obtained From Patient Wound History Do you currently have one or more open woundso Yes How many open wounds do you currently haveo 1 Approximately how long have you had your woundso 1 year How have you been treating your wound(s) until nowo nuskin Has your wound(s) ever healed and then re-openedo No Have you had any lab work done in the past montho No Have you tested positive for an antibiotic resistant organism (MRSA, VRE)o No Have you tested positive for osteomyelitis (bone infection)o No Have you had any tests for circulation on your legso No Constitutional Symptoms (General Health) Complaints and Symptoms: Negative for: Fever; Chills Eyes Medical History: Positive for: Cataracts - removed Negative for: Glaucoma; Optic Neuritis Ear/Nose/Mouth/Throat Medical History: Negative for: Chronic sinus problems/congestion; Middle ear problems Hematologic/Lymphatic Medical History: Negative for: Anemia; Hemophilia; Human Immunodeficiency Virus; Lymphedema; Sickle Cell Disease Respiratory Complaints and Symptoms: No Complaints or Symptoms Medical History: Positive for: Sleep Apnea - CPAP Negative for: Aspiration; Asthma; Chronic Obstructive Pulmonary Disease (COPD); Pneumothorax; Tuberculosis Cardiovascular Complaints and Symptoms: No Complaints or Symptoms Medical History: Positive for: Hypertension [name], Tyler K. ([id number]) Negative for: Angina; Arrhythmia; Congestive Heart Failure; Coronary Artery Disease; Deep Vein Thrombosis; Hypotension; Myocardial Infarction; Peripheral Arterial Disease; Peripheral Venous Disease; Phlebitis; Vasculitis Past Medical History Notes: heart murmur Gastrointestinal Medical History: Negative for: Cirrhosis ; Colitis; Crohnos; Hepatitis A; Hepatitis B; Hepatitis C Endocrine Medical History: Negative for: Type I  Diabetes; Type II Diabetes Genitourinary Medical History: Negative for: End Stage Renal Disease Past Medical History Notes: HSV Immunological Medical History: Negative for: Lupus Erythematosus; Raynaudos; Scleroderma Integumentary (Skin) Medical History: Negative for: History of Burn; History of pressure wounds Musculoskeletal Medical History: Positive for: Osteoarthritis Negative for: Gout; Rheumatoid Arthritis Neurologic Medical History: Negative for: Dementia; Neuropathy; Quadriplegia; Paraplegia; Seizure Disorder Oncologic Medical History: Negative for: Received Chemotherapy; Received Radiation Psychiatric Complaints and Symptoms: No Complaints or Symptoms Medical History: Negative for: Anorexia/bulimia; Confinement Anxiety HBO Extended History Items Eyes: Cataracts [name], Tyler K. ([id number]) Immunizations Pneumococcal Vaccine: Received Pneumococcal Vaccination: Yes Immunization Notes: up to date Implantable Devices Family and Social History Cancer: No; Diabetes: Yes - Father; Heart Disease: Yes - Paternal Grandparents; Hereditary Spherocytosis: No; Hypertension: Yes - Mother; Kidney Disease: No; Lung Disease: Yes - Mother; Seizures: No; Stroke: No; Thyroid Problems: No; Tuberculosis: No; Never smoker; Marital Status - Married; Alcohol Use: Never; Drug Use: No History; Caffeine Use: Moderate; Financial Concerns: No; Food, Clothing or Shelter Needs: No; Support System Lacking: No; Transportation Concerns: No; Advanced Directives: Yes (Not Provided); Patient does not want information on Advanced Directives; Medical Power of Attorney: Yes - Tammy Rezas (Not Provided) Physician Affirmation I have reviewed and agree with the above information. Electronic Signature(s) Signed: 12/28/2017 8:12:43 AM By: Roger Shelter Signed: 12/28/2017 8:54:03 AM By: Worthy Keeler PA-C Entered By: Worthy Keeler on 12/27/2017 13:54:34 Gagliano, Carma Hayden  ([id number]) -------------------------------------------------------------------------------- SuperBill Details Patient Name: [name]. Date of Service: 12/27/2017 Medical Record Number: [id number] Patient Account Number: [id number] Date of Birth/Sex: [birth year] (72 y.o. 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 I10 Essential (primary) hypertension Facility Procedures CPT4: Description Modifier Quantity Code 20254270 15271 - SKIN SUB GRAFT TRNK/ARM/LEG 1 ICD-10 Diagnosis Description S81.801S Unspecified  open wound, right lower leg, sequela L97.816 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  involvement without evidence of necrosis I10 Essential (primary) hypertension CPT4: [id number] Q4121- Theraskin per 1sq cm extra small -6 sq cm 6 Physician Procedures CPT4: Description Modifier Quantity Code 0511021 11735 - WC PHYS SKIN SUB GRAFT TRNK/ARM/LEG 1 ICD-10 Diagnosis Description A70.141C Unspecified open wound, right lower leg, sequela L97.816 Non-pressure chronic ulcer of other part of right lower leg with  bone involvement without evidence of necrosis I10 Essential (primary) hypertension Electronic Signature(s) Signed: 12/28/2017 10:43:54 PM By: Worthy Keeler PA-C Previous Signature: 12/28/2017 8:54:03 AM Version By: Worthy Keeler PA-C Entered By: Sharon Mt on 12/28/2017 16:24:03

## 2018-01-01 NOTE — Progress Notes (Signed)
DARRIN, KOMAN ([id number]) Visit Report for 12/27/2017 Arrival Information Details Patient Name: [name], [name]. Date of Service: 12/27/2017 12:45 PM Medical Record Number: [id number] Patient Account Number: [id number] Date of Birth/Sex: [birth year] (72 y.o. M) Treating RN: Roger Shelter Primary Care Winnell Bento: Elsie Stain Other Clinician: Referring Nykole Matos: Elsie Stain Treating Cheyane Ayon/Extender: Melburn Hake, HOYT Weeks in Treatment: 6 Visit Information History Since Last Visit Added or deleted any medications: No Patient Arrived: Ambulatory Any new allergies or adverse reactions: No Arrival Time: 12:53 Had a fall or experienced change in No Accompanied By: wife activities of daily living that may affect Transfer Assistance: None risk of falls: Patient Identification Verified: Yes Signs or symptoms of abuse/neglect since last visito No Secondary Verification Process Yes Hospitalized since last visit: No Completed: Implantable device outside of the clinic excluding No Patient Requires Transmission-Based No cellular tissue based products placed in the center Precautions: since last visit: Patient Has Alerts: Yes Has Dressing in Place as Prescribed: Yes Patient Alerts: ABI [place] BILATERAL Pain Present Now: No >220 Electronic Signature(s) Signed: 12/27/2017 1:01:16 PM By: Lorine Bears RCP, RRT, CHT Entered By: Becky Sax, Amado Nash on 12/27/2017 12:54:18 [name] ([id number]) -------------------------------------------------------------------------------- Lower Extremity Assessment Details Patient Name: [name]. Date of Service: 12/27/2017 12:45 PM Medical Record Number: [id number] Patient Account Number: [id number] Date of Birth/Sex: [birth year] (72 y.o. M) Treating RN: Secundino Ginger Primary Care Jaycob Mcclenton: Elsie Stain Other Clinician: Referring Savio Albrecht: Elsie Stain Treating Ashaki Frosch/Extender: Melburn Hake, HOYT Weeks in  Treatment: 6 Electronic Signature(s) Signed: 12/27/2017 1:18:11 PM By: Secundino Ginger Entered By: Secundino Ginger on 12/27/2017 13:05:13 Kloc, [name] ([id number]) -------------------------------------------------------------------------------- Multi Wound Chart Details Patient Name: [name]. Date of Service: 12/27/2017 12:45 PM Medical Record Number: [id number] Patient Account Number: [id number] Date of Birth/Sex: 1945-07-31 (72 y.o. M) Treating RN: Roger Shelter Primary Care Barret Esquivel: Elsie Stain Other Clinician: Referring Jadzia Ibsen: Elsie Stain Treating Savilla Turbyfill/Extender: Melburn Hake, HOYT Weeks in Treatment: 6 Vital Signs Height(in): 74 Pulse(bpm): 42 Weight(lbs): 220 Blood Pressure(mmHg): 141/79 Body Mass Index(BMI): 28 Temperature(F): 98.2 Respiratory Rate 18 (breaths/min): Photos: [N/A:N/A] Wound Location: Left Lower Leg - Medial N/A N/A Wounding Event: Trauma N/A N/A Primary Etiology: Trauma, Other N/A N/A Comorbid History: Cataracts, Sleep Apnea, N/A N/A Hypertension, Osteoarthritis Date Acquired: 11/13/2016 N/A N/A Weeks of Treatment: 6 N/A N/A Wound Status: Open N/A N/A Pending Amputation on Yes N/A N/A Presentation: Measurements L x W x D 1.4x0.2x0.2 N/A N/A (cm) Area (cm) : 0.22 N/A N/A Volume (cm) : 0.044 N/A N/A % Reduction in Area: 33.30% N/A N/A % Reduction in Volume: 55.60% N/A N/A Classification: Full Thickness With Exposed N/A N/A Support Structures Exudate Amount: Medium N/A N/A Exudate Type: Serous N/A N/A Exudate Color: amber N/A N/A Wound Margin: Flat and Intact N/A N/A Granulation Amount: None Present (0%) N/A N/A Necrotic Amount: Large (67-100%) N/A N/A Exposed Structures: Bone: Yes N/A N/A Fascia: No Fat Layer (Subcutaneous Tissue) Exposed: No Tendon: No Mathenia, Bevan K. ([id number]) Muscle: No Joint: No Epithelialization: None N/A N/A Periwound Skin Texture: Scarring: Yes N/A N/A Excoriation: No Induration:  No Callus: No Crepitus: No Rash: No Periwound Skin Moisture: Dry/Scaly: Yes N/A N/A Maceration: No Periwound Skin Color: Erythema: Yes N/A N/A Atrophie Blanche: No Cyanosis: No Ecchymosis: No Hemosiderin Staining: No Mottled: No Pallor: No Rubor: No Erythema Location: Circumferential N/A N/A Temperature: No Abnormality N/A N/A Tenderness on Palpation: Yes N/A N/A Wound Preparation: Ulcer Cleansing: N/A N/A Rinsed/Irrigated with Saline Topical Anesthetic Applied: Other: lidocaine 4% Treatment Notes  Electronic Signature(s) Signed: 12/28/2017 8:12:43 AM By: Roger Shelter Entered By: Roger Shelter on 12/27/2017 13:16:56 [name] ([id number]) -------------------------------------------------------------------------------- [place] Details Patient Name: ELLISON, RIETH. Date of Service: 12/27/2017 12:45 PM Medical Record Number: [id number] Patient Account Number: [id number] Date of Birth/Sex: 17-Jul-1945 (72 y.o. M) Treating RN: Roger Shelter Primary Care Nakiyah Beverley: Elsie Stain Other Clinician: Referring Vonetta Foulk: Elsie Stain Treating Mireya Meditz/Extender: Melburn Hake, HOYT Weeks in Treatment: 6 Active Inactive ` Necrotic Tissue Nursing Diagnoses: Knowledge deficit related to management of necrotic/devitalized tissue Goals: Necrotic/devitalized tissue will be minimized in the wound bed Date Initiated: 11/13/2017 Target Resolution Date: 11/16/2017 Goal Status: Active Interventions: Provide education on necrotic tissue and debridement process Treatment Activities: Apply topical anesthetic as ordered : 11/13/2017 Notes: ` Orientation to the Wound Care Program Nursing Diagnoses: Knowledge deficit related to the wound healing center program Goals: Patient/caregiver will verbalize understanding of the [facility] Program Date Initiated: 11/13/2017 Target Resolution Date: 11/16/2017 Goal Status: Active Interventions: Provide  education on orientation to the wound center Notes: ` Wound/Skin Impairment Nursing Diagnoses: Impaired tissue integrity Goals: Ulcer/skin breakdown will have a volume reduction of 30% by week 4 Date Initiated: 11/13/2017 Target Resolution Date: 12/14/2017 SHAQUIL, ALDANA ([id number]) Goal Status: Active Interventions: Assess patient/caregiver ability to obtain necessary supplies Treatment Activities: Skin care regimen initiated : 11/13/2017 Notes: Electronic Signature(s) Signed: 12/28/2017 8:12:43 AM By: Roger Shelter Entered By: Roger Shelter on 12/27/2017 13:16:48 Kuhner, [name] ([id number]) -------------------------------------------------------------------------------- Pain Assessment Details Patient Name: [name]. Date of Service: 12/27/2017 12:45 PM Medical Record Number: [id number] Patient Account Number: [id number] Date of Birth/Sex: [birth year] (72 y.o. M) Treating RN: Roger Shelter Primary Care Toniesha Zellner: Elsie Stain Other Clinician: Referring Mort Smelser: Elsie Stain Treating Colena Ketterman/Extender: Melburn Hake, HOYT Weeks in Treatment: 6 Active Problems Location of Pain Severity and Description of Pain Patient Has Paino No Site Locations Pain Management and Medication Current Pain Management: Electronic Signature(s) Signed: 12/27/2017 1:01:16 PM By: Lorine Bears RCP, RRT, CHT Signed: 12/28/2017 8:12:43 AM By: Roger Shelter Entered By: Lorine Bears on 12/27/2017 12:54:26 Hammac, Reginald K. ([id number]) -------------------------------------------------------------------------------- Wound Assessment Details Patient Name: [name]. Date of Service: 12/27/2017 12:45 PM Medical Record Number: [id number] Patient Account Number: [id number] Date of Birth/Sex: 1946/02/04 (72 y.o. M) Treating RN: Secundino Ginger Primary Care Valyncia Wiens: Elsie Stain Other Clinician: Referring Florentina Marquart: Elsie Stain Treating  Lorna Strother/Extender: Melburn Hake, HOYT Weeks in Treatment: 6 Wound Status Wound Number: 2 Primary Trauma, Other Etiology: Wound Location: Left Lower Leg - Medial Wound Status: Open Wounding Event: Trauma Comorbid Cataracts, Sleep Apnea, Hypertension, Date Acquired: 11/13/2016 History: Osteoarthritis Weeks Of Treatment: 6 Clustered Wound: No Pending Amputation On Presentation Photos Photo Uploaded By: Secundino Ginger on 12/27/2017 13:07:13 Wound Measurements Length: (cm) 1.4 Width: (cm) 0.2 Depth: (cm) 0.2 Area: (cm) 0.22 Volume: (cm) 0.044 % Reduction in Area: 33.3% % Reduction in Volume: 55.6% Epithelialization: None Tunneling: No Undermining: No Wound Description Full Thickness With Exposed Support Foul Odor A Classification: Structures Slough/Fibr Wound Margin: Flat and Intact Exudate Medium Amount: Exudate Type: Serous Exudate Color: amber fter Cleansing: No ino Yes Wound Bed Granulation Amount: None Present (0%) Exposed Structure Necrotic Amount: Large (67-100%) Fascia Exposed: No Necrotic Quality: Adherent Slough Fat Layer (Subcutaneous Tissue) Exposed: No Tendon Exposed: No Muscle Exposed: No Joint Exposed: No Rickert, Freeland K. ([id number]) Bone Exposed: Yes Periwound Skin Texture Texture Color No Abnormalities Noted: No No Abnormalities Noted: No Callus: No Atrophie Blanche: No Crepitus: No Cyanosis: No Excoriation: No Ecchymosis: No  Induration: No Erythema: Yes Rash: No Erythema Location: Circumferential Scarring: Yes Hemosiderin Staining: No Mottled: No Moisture Pallor: No No Abnormalities Noted: No Rubor: No Dry / Scaly: Yes Maceration: No Temperature / Pain Temperature: No Abnormality Tenderness on Palpation: Yes Wound Preparation Ulcer Cleansing: Rinsed/Irrigated with Saline Topical Anesthetic Applied: Other: lidocaine 4%, Electronic Signature(s) Signed: 12/27/2017 1:18:11 PM By: Secundino Ginger Entered By: Secundino Ginger on 12/27/2017  13:03:17 [name] ([id number]) -------------------------------------------------------------------------------- Vitals Details Patient Name: [name]. Date of Service: 12/27/2017 12:45 PM Medical Record Number: [id number] Patient Account Number: [id number] Date of Birth/Sex: December 15, 1945 (72 y.o. M) Treating RN: Roger Shelter Primary Care Brisa Auth: Elsie Stain Other Clinician: Referring Katiejo Gilroy: Elsie Stain Treating Aleicia Kenagy/Extender: Melburn Hake, HOYT Weeks in Treatment: 6 Vital Signs Time Taken: 12:53 Temperature (F): 98.2 Height (in): 74 Pulse (bpm): 68 Weight (lbs): 220 Respiratory Rate (breaths/min): 18 Body Mass Index (BMI): 28.2 Blood Pressure (mmHg): 141/79 Reference Range: 80 - 120 mg / dl Electronic Signature(s) Signed: 12/27/2017 1:01:16 PM By: Lorine Bears RCP, RRT, CHT Entered By: Becky Sax, Amado Nash on 12/27/2017 12:56:29

## 2018-01-15 ENCOUNTER — Encounter: Payer: PPO | Attending: Physician Assistant | Admitting: Physician Assistant

## 2018-01-15 DIAGNOSIS — I10 Essential (primary) hypertension: Secondary | ICD-10-CM | POA: Diagnosis not present

## 2018-01-15 DIAGNOSIS — Z833 Family history of diabetes mellitus: Secondary | ICD-10-CM | POA: Diagnosis not present

## 2018-01-15 DIAGNOSIS — Z8249 Family history of ischemic heart disease and other diseases of the circulatory system: Secondary | ICD-10-CM | POA: Insufficient documentation

## 2018-01-15 DIAGNOSIS — E11622 Type 2 diabetes mellitus with other skin ulcer: Secondary | ICD-10-CM | POA: Diagnosis present

## 2018-01-15 DIAGNOSIS — M1990 Unspecified osteoarthritis, unspecified site: Secondary | ICD-10-CM | POA: Diagnosis not present

## 2018-01-15 DIAGNOSIS — L97816 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 involvement without evidence of necrosis: Secondary | ICD-10-CM | POA: Diagnosis not present

## 2018-01-15 DIAGNOSIS — L97826 Non-pressure chronic ulcer of other part of left lower leg with bone involvement without evidence of necrosis: Secondary | ICD-10-CM | POA: Diagnosis not present

## 2018-01-15 DIAGNOSIS — Z836 Family history of other diseases of the respiratory system: Secondary | ICD-10-CM | POA: Insufficient documentation

## 2018-01-19 NOTE — Progress Notes (Signed)
EWARD, RUTIGLIANO ([id number]) Visit Report for 01/15/2018 Arrival Information Details Patient Name: [name], [name]. Date of Service: 01/15/2018 12:30 PM Medical Record Number: [id number] Patient Account Number: [id number] Date of Birth/Sex: [birth year] (72 y.o. M) Treating RN: Montey Hora Primary Care Kaoru Benda: Elsie Stain Other Clinician: Referring Chalene Treu: Elsie Stain Treating Lilly Gasser/Extender: Melburn Hake, HOYT Weeks in Treatment: 9 Visit Information History Since Last Visit Added or deleted any medications: No Patient Arrived: Ambulatory Any new allergies or adverse reactions: No Arrival Time: 12:35 Had a fall or experienced change in No Accompanied By: wife activities of daily living that may affect Transfer Assistance: None risk of falls: Patient Identification Verified: Yes Signs or symptoms of abuse/neglect since last visito No Secondary Verification Process Yes Hospitalized since last visit: No Completed: Implantable device outside of the clinic excluding No Patient Requires Transmission-Based No cellular tissue based products placed in the center Precautions: since last visit: Patient Has Alerts: Yes Has Dressing in Place as Prescribed: Yes Patient Alerts: ABI [place] BILATERAL Pain Present Now: No >220 Electronic Signature(s) Signed: 01/15/2018 3:15:29 PM By: Lorine Bears RCP, RRT, CHT Entered By: Lorine Bears on 01/15/2018 12:38:24 [name] ([id number]) -------------------------------------------------------------------------------- Clinic Level of Care Assessment Details Patient Name: [name]. Date of Service: 01/15/2018 12:30 PM Medical Record Number: [id number] Patient Account Number: [id number] Date of Birth/Sex: 1945/10/08 (72 y.o. M) Treating RN: Montey Hora Primary Care Mishka Stegemann: Elsie Stain Other Clinician: Referring Rhylie Stehr: Elsie Stain Treating Adalaya Irion/Extender: Melburn Hake,  HOYT Weeks in Treatment: 9 Clinic Level of Care Assessment Items TOOL 4 Quantity Score []  - Use when only an EandM is performed on FOLLOW-UP visit 0 ASSESSMENTS - Nursing Assessment / Reassessment X - Reassessment of Co-morbidities (includes updates in patient status) 1 10 X- 1 5 Reassessment of Adherence to Treatment Plan ASSESSMENTS - Wound and Skin Assessment / Reassessment X - Simple Wound Assessment / Reassessment - one wound 1 5 []  - 0 Complex Wound Assessment / Reassessment - multiple wounds []  - 0 Dermatologic / Skin Assessment (not related to wound area) ASSESSMENTS - Focused Assessment []  - Circumferential Edema Measurements - multi extremities 0 []  - 0 Nutritional Assessment / Counseling / Intervention X- 1 5 Lower Extremity Assessment (monofilament, tuning fork, pulses) []  - 0 Peripheral Arterial Disease Assessment (using hand held doppler) ASSESSMENTS - Ostomy and/or Continence Assessment and Care []  - Incontinence Assessment and Management 0 []  - 0 Ostomy Care Assessment and Management (repouching, etc.) PROCESS - Coordination of Care X - Simple Patient / Family Education for ongoing care 1 15 []  - 0 Complex (extensive) Patient / Family Education for ongoing care []  - 0 Staff obtains Programmer, systems, Records, Test Results / Process Orders []  - 0 Staff telephones HHA, Nursing Homes / Clarify orders / etc []  - 0 Routine Transfer to another Facility (non-emergent condition) []  - 0 Routine Hospital Admission (non-emergent condition) []  - 0 New Admissions / Biomedical engineer / Ordering NPWT, Apligraf, etc. []  - 0 Emergency Hospital Admission (emergent condition) X- 1 10 Simple Discharge Coordination [name], HEISLER. ([id number]) []  - 0 Complex (extensive) Discharge Coordination PROCESS - Special Needs []  - Pediatric / Minor Patient Management 0 []  - 0 Isolation Patient Management []  - 0 Hearing / Language / Visual special needs []  - 0 Assessment of  Community assistance (transportation, D/C planning, etc.) []  - 0 Additional assistance / Altered mentation []  - 0 Support Surface(s) Assessment (bed, cushion, seat, etc.) INTERVENTIONS - Wound Cleansing / Measurement X - Simple Wound  Cleansing - one wound 1 5 []  - 0 Complex Wound Cleansing - multiple wounds X- 1 5 Wound Imaging (photographs - any number of wounds) []  - 0 Wound Tracing (instead of photographs) X- 1 5 Simple Wound Measurement - one wound []  - 0 Complex Wound Measurement - multiple wounds INTERVENTIONS - Wound Dressings X - Small Wound Dressing one or multiple wounds 1 10 []  - 0 Medium Wound Dressing one or multiple wounds []  - 0 Large Wound Dressing one or multiple wounds []  - 0 Application of Medications - topical []  - 0 Application of Medications - injection INTERVENTIONS - Miscellaneous []  - External ear exam 0 []  - 0 Specimen Collection (cultures, biopsies, blood, body fluids, etc.) []  - 0 Specimen(s) / Culture(s) sent or taken to Lab for analysis []  - 0 Patient Transfer (multiple staff / Civil Service fast streamer / Similar devices) []  - 0 Simple Staple / Suture removal (25 or less) []  - 0 Complex Staple / Suture removal (26 or more) []  - 0 Hypo / Hyperglycemic Management (close monitor of Blood Glucose) []  - 0 Ankle / Brachial Index (ABI) - do not check if billed separately X- 1 5 Vital Signs [name], Tyler K. ([id number]) Has the patient been seen at the hospital within the last three years: Yes Total Score: 80 Level Of Care: New/Established - Level 3 Electronic Signature(s) Signed: 01/15/2018 5:00:45 PM By: Montey Hora Entered By: Montey Hora on 01/15/2018 13:17:47 [name] ([id number]) -------------------------------------------------------------------------------- Encounter Discharge Information Details Patient Name: [name]. Date of Service: 01/15/2018 12:30 PM Medical Record Number: [id number] Patient Account Number:  [id number] Date of Birth/Sex: 1946-03-05 (72 y.o. M) Treating RN: Roger Shelter Primary Care Petr Bontempo: Elsie Stain Other Clinician: Referring Carroll Lingelbach: Elsie Stain Treating Calin Ellery/Extender: Melburn Hake, HOYT Weeks in Treatment: 9 Encounter Discharge Information Items Discharge Condition: Stable Ambulatory Status: Ambulatory Discharge Destination: Home Transportation: Private Auto Schedule Follow-up Appointment: Yes Clinical Summary of Care: Electronic Signature(s) Signed: 01/15/2018 4:16:12 PM By: Roger Shelter Entered By: Roger Shelter on 01/15/2018 13:24:58 [name], [name] ([id number]) -------------------------------------------------------------------------------- Lower Extremity Assessment Details Patient Name: [name]. Date of Service: 01/15/2018 12:30 PM Medical Record Number: [id number] Patient Account Number: [id number] Date of Birth/Sex: 1945/05/25 (72 y.o. M) Treating RN: Cornell Barman Primary Care Meleana Commerford: Elsie Stain Other Clinician: Referring Elaine Middleton: Elsie Stain Treating Nikol Lemar/Extender: Melburn Hake, HOYT Weeks in Treatment: 9 Edema Assessment Assessed: [Left: No] [Right: No] Edema: [Left: N] [Right: o] Vascular Assessment Pulses: Dorsalis Pedis Palpable: [Left:Yes] Posterior Tibial Extremity colors, hair growth, and conditions: Extremity Color: [Left:Normal] Hair Growth on Extremity: [Left:Yes] Temperature of Extremity: [Left:Warm] Capillary Refill: [Left:< 3 seconds] Toe Nail Assessment Left: Right: Thick: No Discolored: No Deformed: No Electronic Signature(s) Signed: 01/15/2018 4:33:57 PM By: Gretta Cool, BSN, RN, CWS, Kim RN, BSN Entered By: Gretta Cool, BSN, RN, CWS, Kim on 01/15/2018 12:48:22 [name] ([id number]) -------------------------------------------------------------------------------- Multi Wound Chart Details Patient Name: [name]. Date of Service: 01/15/2018 12:30 PM Medical Record Number:  [id number] Patient Account Number: [id number] Date of Birth/Sex: 1945-06-08 (72 y.o. M) Treating RN: Montey Hora Primary Care Mamye Bolds: Elsie Stain Other Clinician: Referring Stony Stegmann: Elsie Stain Treating Ionna Avis/Extender: Melburn Hake, HOYT Weeks in Treatment: 9 Vital Signs Height(in): 74 Pulse(bpm): 71 Weight(lbs): 220 Blood Pressure(mmHg): 143/83 Body Mass Index(BMI): 28 Temperature(F): 98.5 Respiratory Rate 18 (breaths/min): Photos: [2:No Photos] [N/A:N/A] Wound Location: [2:Left Lower Leg - Medial] [N/A:N/A] Wounding Event: [2:Trauma] [N/A:N/A] Primary Etiology: [2:Trauma, Other] [N/A:N/A] Comorbid History: [2:Cataracts, Sleep Apnea, Hypertension, Osteoarthritis] [N/A:N/A] Date Acquired: [2:11/13/2016] [N/A:N/A] Weeks  of Treatment: [2:9] [N/A:N/A] Wound Status: [2:Open] [N/A:N/A] Pending Amputation on [2:Yes] [N/A:N/A] Presentation: Measurements L x W x D [2:1.2x0.2x0.2] [N/A:N/A] (cm) Area (cm) : [2:0.188] [N/A:N/A] Volume (cm) : [2:0.038] [N/A:N/A] % Reduction in Area: [2:43.00%] [N/A:N/A] % Reduction in Volume: [2:61.60%] [N/A:N/A] Classification: [2:Full Thickness With Exposed Support Structures] [N/A:N/A] Exudate Amount: [2:Medium] [N/A:N/A] Exudate Type: [2:Serous] [N/A:N/A] Exudate Color: [2:amber] [N/A:N/A] Wound Margin: [2:Flat and Intact] [N/A:N/A] Granulation Amount: [2:None Present (0%)] [N/A:N/A] Necrotic Amount: [2:Large (67-100%)] [N/A:N/A] Exposed Structures: [2:Bone: Yes Fascia: No Fat Layer (Subcutaneous Tissue) Exposed: No Tendon: No Muscle: No Joint: No] [N/A:N/A] Epithelialization: [2:None] [N/A:N/A] Periwound Skin Texture: [2:Scarring: Yes Excoriation: No Induration: No Callus: No] [N/A:N/A] Crepitus: No Rash: No Periwound Skin Moisture: Dry/Scaly: Yes N/A N/A Maceration: No Periwound Skin Color: Erythema: Yes N/A N/A Atrophie Blanche: No Cyanosis: No Ecchymosis: No Hemosiderin Staining: No Mottled: No Pallor: No Rubor:  No Erythema Location: Circumferential N/A N/A Temperature: No Abnormality N/A N/A Tenderness on Palpation: Yes N/A N/A Wound Preparation: Ulcer Cleansing: N/A N/A Rinsed/Irrigated with Saline Topical Anesthetic Applied: Other: lidocaine 4% Treatment Notes Electronic Signature(s) Signed: 01/15/2018 5:00:45 PM By: Montey Hora Entered By: Montey Hora on 01/15/2018 13:04:08 [name] ([id number]) -------------------------------------------------------------------------------- [place] Details Patient Name: [name], FREER. Date of Service: 01/15/2018 12:30 PM Medical Record Number: [id number] Patient Account Number: [id number] Date of Birth/Sex: 10-28-1945 (72 y.o. M) Treating RN: Montey Hora Primary Care Michell Giuliano: Elsie Stain Other Clinician: Referring Murray Durrell: Elsie Stain Treating Nikhil Osei/Extender: Melburn Hake, HOYT Weeks in Treatment: 9 Active Inactive ` Necrotic Tissue Nursing Diagnoses: Knowledge deficit related to management of necrotic/devitalized tissue Goals: Necrotic/devitalized tissue will be minimized in the wound bed Date Initiated: 11/13/2017 Target Resolution Date: 11/16/2017 Goal Status: Active Interventions: Provide education on necrotic tissue and debridement process Treatment Activities: Apply topical anesthetic as ordered : 11/13/2017 Notes: ` Orientation to the Wound Care Program Nursing Diagnoses: Knowledge deficit related to the wound healing center program Goals: Patient/caregiver will verbalize understanding of the [place] Program Date Initiated: 11/13/2017 Target Resolution Date: 11/16/2017 Goal Status: Active Interventions: Provide education on orientation to the wound center Notes: ` Wound/Skin Impairment Nursing Diagnoses: Impaired tissue integrity Goals: Ulcer/skin breakdown will have a volume reduction of 30% by week 4 Date Initiated: 11/13/2017 Target Resolution Date:  12/14/2017 [name], [name] ([id number]) Goal Status: Active Interventions: Assess patient/caregiver ability to obtain necessary supplies Treatment Activities: Skin care regimen initiated : 11/13/2017 Notes: Electronic Signature(s) Signed: 01/15/2018 5:00:45 PM By: Montey Hora Entered By: Montey Hora on 01/15/2018 13:04:01 [name] ([id number]) -------------------------------------------------------------------------------- Pain Assessment Details Patient Name: [name]. Date of Service: 01/15/2018 12:30 PM Medical Record Number: [id number] Patient Account Number: [id number] Date of Birth/Sex: 08/24/1945 (72 y.o. M) Treating RN: Montey Hora Primary Care Jonus Coble: Elsie Stain Other Clinician: Referring Khale Nigh: Elsie Stain Treating Mizraim Harmening/Extender: Melburn Hake, HOYT Weeks in Treatment: 9 Active Problems Location of Pain Severity and Description of Pain Patient Has Paino No Site Locations Pain Management and Medication Current Pain Management: Electronic Signature(s) Signed: 01/15/2018 3:15:29 PM By: Lorine Bears RCP, RRT, CHT Signed: 01/15/2018 5:00:45 PM By: Montey Hora Entered By: Lorine Bears on 01/15/2018 12:38:31 [name] ([id number]) -------------------------------------------------------------------------------- Patient/Caregiver Education Details Patient Name: [name]. Date of Service: 01/15/2018 12:30 PM Medical Record Number: [id number] Patient Account Number: [id number] Date of Birth/Gender: [birth year] (72 y.o. M) Treating RN: Montey Hora Primary Care Physician: Elsie Stain Other Clinician: Referring Physician: Elsie Stain Treating Physician/Extender: STONE III, HOYT Weeks in Treatment: 9  Education Assessment Education Provided To: Patient and Caregiver Education Topics Provided Wound/Skin Impairment: Handouts: Other: possibility of another skin sub or plastic  surgery Methods: Explain/Verbal Responses: State content correctly Electronic Signature(s) Signed: 01/15/2018 4:16:12 PM By: Roger Shelter Entered By: Roger Shelter on 01/15/2018 13:25:05 [name], [name] ([id number]) -------------------------------------------------------------------------------- Wound Assessment Details Patient Name: [name]. Date of Service: 01/15/2018 12:30 PM Medical Record Number: [id number] Patient Account Number: [id number] Date of Birth/Sex: [birth year] (72 y.o. M) Treating RN: Cornell Barman Primary Care Violia Knopf: Elsie Stain Other Clinician: Referring [name]: Elsie Stain Treating Jasraj Lappe/Extender: Melburn Hake, HOYT Weeks in Treatment: 9 Wound Status Wound Number: 2 Primary Trauma, Other Etiology: Wound Location: Left Lower Leg - Medial Wound Status: Open Wounding Event: Trauma Comorbid Cataracts, Sleep Apnea, Hypertension, Date Acquired: 11/13/2016 History: Osteoarthritis Weeks Of Treatment: 9 Clustered Wound: No Pending Amputation On Presentation Photos Photo Uploaded By: Gretta Cool, BSN, RN, CWS, Kim on 01/15/2018 16:06:49 Wound Measurements Length: (cm) 1.2 Width: (cm) 0.2 Depth: (cm) 0.2 Area: (cm) 0.188 Volume: (cm) 0.038 % Reduction in Area: 43% % Reduction in Volume: 61.6% Epithelialization: None Tunneling: No Undermining: No Wound Description Full Thickness With Exposed Support Foul Odor Classification: Structures Slough/Fi Wound Margin: Flat and Intact Exudate Medium Amount: Exudate Type: Serous Exudate Color: amber After Cleansing: No brino Yes Wound Bed Granulation Amount: None Present (0%) Exposed Structure Necrotic Amount: Large (67-100%) Fascia Exposed: No Necrotic Quality: Adherent Slough Fat Layer (Subcutaneous Tissue) Exposed: No Tendon Exposed: No Muscle Exposed: No Joint Exposed: No [name], Tyler K. ([id number]) Bone Exposed: Yes Periwound Skin Texture Texture Color No Abnormalities  Noted: No No Abnormalities Noted: No Callus: No Atrophie Blanche: No Crepitus: No Cyanosis: No Excoriation: No Ecchymosis: No Induration: No Erythema: Yes Rash: No Erythema Location: Circumferential Scarring: Yes Hemosiderin Staining: No Mottled: No Moisture Pallor: No No Abnormalities Noted: No Rubor: No Dry / Scaly: Yes Maceration: No Temperature / Pain Temperature: No Abnormality Tenderness on Palpation: Yes Wound Preparation Ulcer Cleansing: Rinsed/Irrigated with Saline Topical Anesthetic Applied: Other: lidocaine 4%, Treatment Notes Wound #2 (Left, Medial Lower Leg) 1. Cleansed with: Clean wound with Normal Saline 2. Anesthetic Topical Lidocaine 4% cream to wound bed prior to debridement 4. Dressing Applied: Prisma Ag 5. Secondary Dressing Applied Bordered Foam Dressing Electronic Signature(s) Signed: 01/15/2018 4:33:57 PM By: Gretta Cool, BSN, RN, CWS, Kim RN, BSN Entered By: Gretta Cool, BSN, RN, CWS, Kim on 01/15/2018 12:47:38 [name] ([id number]) -------------------------------------------------------------------------------- Vitals Details Patient Name: [name] Date of Service: 01/15/2018 12:30 PM Medical Record Number: [id number] Patient Account Number: [id number] Date of Birth/Sex: [birth year] (72 y.o. M) Treating RN: Montey Hora Primary Care Blaze Nylund: Elsie Stain Other Clinician: Referring Temekia Caskey: Elsie Stain Treating Teesha Ohm/Extender: Melburn Hake, HOYT Weeks in Treatment: 9 Vital Signs Time Taken: 12:37 Temperature (F): 98.5 Height (in): 74 Pulse (bpm): 71 Weight (lbs): 220 Respiratory Rate (breaths/min): 18 Body Mass Index (BMI): 28.2 Blood Pressure (mmHg): 143/83 Reference Range: 80 - 120 mg / dl Electronic Signature(s) Signed: 01/15/2018 3:15:29 PM By: Lorine Bears RCP, RRT, CHT Entered By: Lorine Bears on 01/15/2018 12:40:47

## 2018-01-19 NOTE — Progress Notes (Signed)
MAX, NUNO ([id number]) Visit Report for 01/15/2018 Chief Complaint Document Details Patient Name: [name], [name]. Date of Service: 01/15/2018 12:30 PM Medical Record Number: [id number] Patient Account Number: [id number] Date of Birth/Sex: 1945/11/14 (72 y.o. M) Treating RN: Montey Hora Primary Care Provider: Elsie Stain Other Clinician: Referring Provider: Elsie Stain Treating Provider/Extender: Melburn Hake, Ifeoluwa Bartz Weeks in Treatment: 9 Information Obtained from: Patient Chief Complaint Left leg ulcer Electronic Signature(s) Signed: 01/16/2018 8:03:16 PM By: Worthy Keeler PA-C Entered By: Worthy Keeler on 01/15/2018 13:47:45 Quarry, [name] ([id number]) -------------------------------------------------------------------------------- HPI Details Patient Name: [name] Date of Service: 01/15/2018 12:30 PM Medical Record Number: [id number] Patient Account Number: [id number] Date of Birth/Sex: 1945-07-09 (72 y.o. M) Treating RN: Montey Hora Primary Care Provider: Elsie Stain Other Clinician: Referring Provider: Elsie Stain Treating Provider/Extender: Melburn Hake, Porter Nakama Weeks in Treatment: 9 History of Present Illness HPI Description: 11/13/17 patient presents today for initial evaluation and our clinic due to an issue he is having with his left lower extremity in the anterior portion. In 1955 he had a significant accident where he fractured his leg and subsequently had the had reconstructive surgery in this location. Nonetheless he states he has done fairly well over the years that time without any complication speak of. Subsequently when year ago he had some trauma potentially occurred to the site which led to an opening that really has not closed since that time. He really does not know what the trauma was in fact he tells me that he doesn't remember anything in particular it is possible this might've been something that just opened up on its own he  really does not know in the end. Nonetheless he's not really had any discomfort or pain although it does appear that there is bone exposed base of the wound. Being on the anterior shin overlying the tibia and with the thin tissue in this area anyway this is always going to be a difficult place to deal. The ulcer itself seems to have opened up along the surgical incision site. 11/19/17 on evaluation today I did have patient's culture available for review and it did appear that his culture was negative for any bacteria just normal skin flora was noted. This makes me think that the fragment of bone which was lifting off was actually not infected but rather just flaking because the bone was to dry with the dressings that were place. Nonetheless I do think he's doing much better today the bone seems to be staying much more moist and I think is better quality overall. In general I'm very happy with the overall appearance of the wound today compared to previous there's little bit of erythema there may be just slight cellulitis surrounding the wound bed this was discussed with the patient. He does have doxycycline 100 mg on hand already which I think he can take and he is going to go ahead and start doing this. His MRI is scheduled for the 10th of this month and five days. 12/03/17 on evaluation today patient appears to be doing rather well in regard to his lower Trinity ulcer. He really has not showed much in the way of improvement in regard to the overall size of the wound. With that being said he still have bone noted in the base of the wound there's really no granulation at this point. I still think the Theraskin would be the ideal thing for him. 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 There is no evidence of ongoing osteomyelitis, abscess, or soft tissue inflammation which was good news upset do not think there's any evidence of  significant infection at this point. 12/10/17 on evaluation today patient presents for follow-up concerning his ongoing left anterior lower extremity ulcer. The good news is his wound does not appear to be any larger today upon evaluation. The unfortunate news is that he continues to really show no signs of improvement in fact the wound actually appears to be essentially unchanged in size today compared to my valuation last week. Again we were having to go forward with additional wound care in order to ensure that there was no progress or change with traditional wound care measures prior to being able to resubmit for the Theraskin which I think would be most appropriate thing for him at this time. Nonetheless based on the fact that we really had no change in the overall wound size or condition over the past month that I have been caring for him since 11/13/17 I think that is definitely appropriate for [place] to proceed with the Theraskin approval. 12/20/17- He is here for follow up evaluation for a left anterior leg. There is noted increased erythema when compared to photo documentation and per patient/spouse report. There is concern in placing theraskin with questionable infection; culture was taken. Theraskin was not place today, he will continue with current treatment plan and he will follow up next week. 12/27/17 on evaluation today patient actually appears to be doing very well in regard to his wound. There's really no change but again I do not see the erythema that characterized last week's evaluation. In general I feel like things are showing signs of improvement. At this point my hope is that the patient will be able to have the [place] in place between now and when I see him back in roughly 2 1/2 weeks time. In that time I'm hoping that the skin graft will actually be able to take to some degree we may need to repeat this I'll be see that was explained to the patient as well. Nonetheless he is  hopeful that this will be of benefit and I agree that this is probably his best chance of trying to get this thing to heal. He knows how to take care of this including keeping it clean and dry as well. DEVEON, KISIEL ([id number]) 01/15/18 on evaluation today patient actually appears to be doing very well in regard to his lower extremity ulcer as far as nothing having worsened over the past 2 1/2 weeks since I last saw him. Unfortunately he also has not really shown any signs of improvement at this time. With that being said we may have some options as far as trying something a little different to see if this can be of benefit for the patient. Electronic Signature(s) Signed: 01/16/2018 8:03:16 PM By: Worthy Keeler PA-C Entered By: Worthy Keeler on 01/15/2018 13:49:26 [name] ([id number]) -------------------------------------------------------------------------------- Physical Exam Details Patient Name: KEIRON, IODICE. Date of Service: 01/15/2018 12:30 PM Medical Record Number: [id number] Patient Account Number: [id number] Date of Birth/Sex: [birth year] (72 y.o. M) Treating RN: Montey Hora Primary Care Provider: Elsie Stain Other Clinician: Referring Provider: Elsie Stain Treating Provider/Extender: STONE III, Jaquila Santelli Weeks in Treatment: 9 Constitutional Well-nourished and well-hydrated in no acute distress. Respiratory normal breathing without difficulty. clear to auscultation bilaterally. Cardiovascular regular rate and rhythm with normal S1, S2. Psychiatric this patient is  able to make decisions and demonstrates good insight into disease process. Alert and Oriented x 3. pleasant and cooperative. Notes Currently patient's wound bed actually show signs of bone noted in the base of the wound although it did not appear to be necrotic at this time which is good news. It also appear to be moist which is also good news. Nonetheless I do not see any additional  epithelialization at this point unfortunately. Electronic Signature(s) Signed: 01/16/2018 8:03:16 PM By: Worthy Keeler PA-C Entered By: Worthy Keeler on 01/15/2018 13:50:12 [name] ([id number]) -------------------------------------------------------------------------------- Physician Orders Details Patient Name: [name] Date of Service: 01/15/2018 12:30 PM Medical Record Number: [id number] Patient Account Number: [id number] Date of Birth/Sex: 07-09-1945 (72 y.o. M) Treating RN: Montey Hora Primary Care Provider: Elsie Stain Other Clinician: Referring Provider: Elsie Stain Treating Provider/Extender: Melburn Hake, Adilen Pavelko Weeks in Treatment: 9 Verbal / Phone Orders: No Diagnosis Coding Wound Cleansing Wound #2 Left,Medial Lower Leg o Clean wound with Normal Saline. Anesthetic (add to Medication List) Wound #2 Left,Medial Lower Leg o Topical Lidocaine 4% cream applied to wound bed prior to debridement (In Clinic Only). Skin Barriers/Peri-Wound Care Wound #2 Left,Medial Lower Leg o Skin Prep Primary Wound Dressing Wound #2 Left,Medial Lower Leg o Silver Collagen Secondary Dressing Wound #2 Left,Medial Lower Leg o Boardered Foam Dressing Dressing Change Frequency Wound #2 Left,Medial Lower Leg o Three times weekly Follow-up Appointments Wound #2 Left,Medial Lower Leg o Return Appointment in 2 weeks. Edema Control Wound #2 Left,Medial Lower Leg o Elevate legs to the level of the heart and pump ankles as often as possible Electronic Signature(s) Signed: 01/15/2018 5:00:45 PM By: Montey Hora Signed: 01/16/2018 8:03:16 PM By: Worthy Keeler PA-C Entered By: Montey Hora on 01/15/2018 13:17:09 Fess, [name] ([id number]) -------------------------------------------------------------------------------- Problem List Details Patient Name: ERVINE, WITUCKI. Date of Service: 01/15/2018 12:30 PM Medical Record Number:  [id number] Patient Account Number: [id number] Date of Birth/Sex: June 23, 1945 (72 y.o. 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 ([id number]) -------------------------------------------------------------------------------- Progress Note Details Patient Name: [name]. Date of Service: 01/15/2018 12:30 PM Medical Record Number: [id number] Patient Account Number: [id number] Date of Birth/Sex: 01-08-1946 (72 y.o. M) Treating RN: Montey Hora Primary Care Provider: Elsie Stain Other Clinician: Referring Provider: Elsie Stain Treating Provider/Extender: Melburn Hake, Delisia Mcquiston Weeks in Treatment: 9 Subjective Chief Complaint Information obtained from Patient Left leg ulcer History of Present Illness (HPI) 11/13/17 patient presents today for initial evaluation and our clinic due to an issue he is having with his left lower extremity in the anterior portion. In 1955 he had a significant accident where he fractured his leg and subsequently had the had reconstructive surgery in this location. Nonetheless he states he has done fairly well over the years that time without any complication speak of. Subsequently when year ago he had some trauma potentially occurred to the site which led to an opening that really has  not closed since that  time. He really does not know what the trauma was in fact he tells me that he doesn't remember anything in particular it is possible this might've been something that just opened up on its own he really does not know in the end. Nonetheless he's not really had any discomfort or pain although it does appear that there is bone exposed base of the wound. Being on the anterior shin overlying the tibia and with the thin tissue in this area anyway this is always going to be a difficult place to deal. The ulcer itself seems to have opened up along the surgical incision site. 11/19/17 on evaluation today I did have patient's culture available for review and it did appear that his culture was negative for any bacteria just normal skin flora was noted. This makes me think that the fragment of bone which was lifting off was actually not infected but rather just flaking because the bone was to dry with the dressings that were place. Nonetheless I do think he's doing much better today the bone seems to be staying much more moist and I think is better quality overall. In general I'm very happy with the overall appearance of the wound today compared to previous there's little bit of erythema there may be just slight cellulitis surrounding the wound bed this was discussed with the patient. He does have doxycycline 100 mg on hand already which I think he can take and he is going to go ahead and start doing this. His MRI is scheduled for the 10th of this month and five days. 12/03/17 on evaluation today patient appears to be doing rather well in regard to his lower Trinity ulcer. He really has not showed much in the way of improvement in regard to the overall size of the wound. With that being said he still have bone noted in the base of the wound there's really no granulation at this point. I still think the Theraskin would be the ideal thing for him. He did have the MRI of his lower extremity  which showed posttraumatic deformities of the left tibia and fibula. There is no evidence of ongoing osteomyelitis, abscess, or soft tissue inflammation which was good news upset do not think there's any evidence of significant infection at this point. 12/10/17 on evaluation today patient presents for follow-up concerning his ongoing left anterior lower extremity ulcer. The good news is his wound does not appear to be any larger today upon evaluation. The unfortunate news is that he continues to really show no signs of improvement in fact the wound actually appears to be essentially unchanged in size today compared to my valuation last week. Again we were having to go forward with additional wound care in order to ensure that there was no progress or change with traditional wound care measures prior to being able to resubmit for the Theraskin which I think would be most appropriate thing for him at this time. Nonetheless based on the fact that we really had no change in the overall wound size or condition over the past month that I have been caring for him since 11/13/17 I think that is definitely appropriate for [place] to proceed with the Theraskin approval. 12/20/17- He is here for follow up evaluation for a left anterior leg. There is noted increased erythema when compared to photo documentation and per patient/spouse report. There is concern in placing theraskin with questionable infection; culture was taken. Theraskin was not place today, he will continue with current  treatment plan and he will follow up next week. 12/27/17 on evaluation today patient actually appears to be doing very well in regard to his wound. There's really no change but again I do not see the erythema that characterized last week's evaluation. In general I feel like things are showing signs of Vollman, Sevon K. ([id number]) improvement. At this point my hope is that the patient will be able to have the [place] in place between  now and when I see him back in roughly 2 1/2 weeks time. In that time I'm hoping that the skin graft will actually be able to take to some degree we may need to repeat this I'll be see that was explained to the patient as well. Nonetheless he is hopeful that this will be of benefit and I agree that this is probably his best chance of trying to get this thing to heal. He knows how to take care of this including keeping it clean and dry as well. 01/15/18 on evaluation today patient actually appears to be doing very well in regard to his lower extremity ulcer as far as nothing having worsened over the past 2 1/2 weeks since I last saw him. Unfortunately he also has not really shown any signs of improvement at this time. With that being said we may have some options as far as trying something a little different to see if this can be of benefit for the patient. Patient History Information obtained from Patient. Family History Diabetes - Father, Heart Disease - Paternal Grandparents, Hypertension - Mother, Lung Disease - Mother, No family history of Cancer, Hereditary Spherocytosis, Kidney Disease, Seizures, Stroke, Thyroid Problems, Tuberculosis. Social History Never smoker, Marital Status - Married, Alcohol Use - Never, Drug Use - No History, Caffeine Use - Moderate. 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 Respiratory The patient has no complaints or symptoms. Cardiovascular The patient has no complaints or symptoms. Psychiatric The patient has no complaints or symptoms. Objective Constitutional Well-nourished and well-hydrated in no acute distress. Vitals Time Taken: 12:37 PM, Height: 74 in, Weight: 220 lbs, BMI: 28.2, Temperature: 98.5 F, Pulse: 71 bpm, Respiratory Rate: 18 breaths/min, Blood Pressure: 143/83  mmHg. Respiratory normal breathing without difficulty. clear to auscultation bilaterally. Cardiovascular TAKOTA, CAHALAN ([id number]) regular rate and rhythm with normal S1, S2. Psychiatric this patient is able to make decisions and demonstrates good insight into disease process. Alert and Oriented x 3. pleasant and cooperative. General Notes: Currently patient's wound bed actually show signs of bone noted in the base of the wound although it did not appear to be necrotic at this time which is good news. It also appear to be moist which is also good news. Nonetheless I do not see any additional epithelialization at this point unfortunately. Integumentary (Hair, Skin) Wound #2 status is Open. Original cause of wound was Trauma. The wound is located on the Left,Medial Lower Leg. The wound measures 1.2cm length x 0.2cm width x 0.2cm depth; 0.188cm^2 area and 0.038cm^3 volume. There is bone exposed. There is no tunneling or undermining noted. There is a medium amount of serous drainage noted. The wound margin is flat and intact. There is no granulation within the wound bed. There is a large (67-100%) amount of necrotic tissue within the wound bed including Adherent Slough. The periwound skin appearance exhibited: Scarring, Dry/Scaly, Erythema. The periwound skin appearance did not exhibit: Callus, Crepitus,  Excoriation, Induration, Rash, Maceration, Atrophie Blanche, Cyanosis, Ecchymosis, Hemosiderin Staining, Mottled, Pallor, Rubor. The surrounding wound skin color is noted with erythema which is circumferential. Periwound temperature was noted as No Abnormality. The periwound has tenderness on palpation. 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 Anesthetic (add to Medication List): Wound #2 Left,Medial Lower Leg: Topical Lidocaine 4% cream applied to wound bed prior to debridement (In Clinic Only). Skin Barriers/Peri-Wound Care: Wound #2 Left,Medial Lower Leg: Skin Prep Primary Wound Dressing: Wound #2 Left,Medial Lower Leg: Silver Collagen Secondary Dressing: Wound #2 Left,Medial Lower Leg: Boardered Foam Dressing Dressing Change Frequency: Wound #2 Left,Medial Lower Leg: PATRICE, MOATES K. ([id number]) Three times weekly Follow-up Appointments: Wound #2 Left,Medial Lower Leg: Return Appointment in 2 weeks. Edema Control: Wound #2 Left,Medial Lower Leg: Elevate legs to the level of the heart and pump ankles as often as possible At this time my suggestion is going to be that we go ahead and see about getting approval for the Grafix PL which I think could be a possibility for the patient as far as attempting to get new tissue growth overlying the bone. The good news is the bone has been free of infection up to this point and I'm hopeful that this will continue to be the case. Obviously we are trying as much is possible to get things to improve and resolve as quickly as possible so as to prevent any infection. Subsequently if there really does not seem to be any good improvement with what we're doing at this time than my next suggestion would likely be that we see about a referral to plastic surgery. I may be willing to give this a try for two weeks time with the Grafix PL but if we're not seeing the improvement that we're looking for the referral would be my next step of choice. The patient understands. Otherwise will see the patient back for a fault visit on 01/28/18 to see were things stand. Please see above for specific wound care orders. We will see patient for re-evaluation in 2 week(s) here in the clinic. If anything worsens or changes patient will contact our office for additional  recommendations. Electronic Signature(s) Signed: 01/16/2018 8:03:16 PM By: Worthy Keeler PA-C Entered By: Worthy Keeler on 01/15/2018 13:52:07 [name] ([id number]) -------------------------------------------------------------------------------- ROS/PFSH Details Patient Name: [name] Date of Service: 01/15/2018 12:30 PM Medical Record Number: [id number] Patient Account Number: [id number] Date of Birth/Sex: [birth year] (72 y.o. M) Treating RN: Montey Hora Primary Care Provider: Elsie Stain Other Clinician: Referring Provider: Elsie Stain Treating Provider/Extender: Melburn Hake, Kashmere Daywalt Weeks in Treatment: 9 Information Obtained From Patient Wound History Do you currently have one or more open woundso Yes How many open wounds do you currently haveo 1 Approximately how long have you had your woundso 1 year How have you been treating your wound(s) until nowo nuskin Has your wound(s) ever healed and then re-openedo No Have you had any lab work done in the past montho No Have you tested positive for an antibiotic resistant organism (MRSA, VRE)o No Have you tested positive for osteomyelitis (bone infection)o No Have you had any tests for circulation on your legso No Constitutional Symptoms (General Health) Complaints and Symptoms: Negative for: Fever; Chills Eyes Medical History: Positive for: Cataracts -  removed Negative for: Glaucoma; Optic Neuritis Ear/Nose/Mouth/Throat Medical History: Negative for: Chronic sinus problems/congestion; Middle ear problems Hematologic/Lymphatic Medical History: Negative for: Anemia; Hemophilia; Human Immunodeficiency Virus; Lymphedema; Sickle Cell Disease Respiratory Complaints and Symptoms: No Complaints or Symptoms Medical History: Positive for: Sleep Apnea - CPAP Negative for: Aspiration; Asthma; Chronic Obstructive Pulmonary Disease (COPD); Pneumothorax; Tuberculosis Cardiovascular Complaints and Symptoms: No  Complaints or Symptoms Medical History: Positive for: Hypertension Meidinger, Lot K. ([id number]) Negative for: Angina; Arrhythmia; Congestive Heart Failure; Coronary Artery Disease; Deep Vein Thrombosis; Hypotension; Myocardial Infarction; Peripheral Arterial Disease; Peripheral Venous Disease; Phlebitis; Vasculitis Past Medical History Notes: heart murmur Gastrointestinal Medical History: Negative for: Cirrhosis ; Colitis; Crohnos; Hepatitis A; Hepatitis B; Hepatitis C Endocrine Medical History: Negative for: Type I Diabetes; Type II Diabetes Genitourinary Medical History: Negative for: End Stage Renal Disease Past Medical History Notes: HSV Immunological Medical History: Negative for: Lupus Erythematosus; Raynaudos; Scleroderma Integumentary (Skin) Medical History: Negative for: History of Burn; History of pressure wounds Musculoskeletal Medical History: Positive for: Osteoarthritis Negative for: Gout; Rheumatoid Arthritis Neurologic Medical History: Negative for: Dementia; Neuropathy; Quadriplegia; Paraplegia; Seizure Disorder Oncologic Medical History: Negative for: Received Chemotherapy; Received Radiation Psychiatric Complaints and Symptoms: No Complaints or Symptoms Medical History: Negative for: Anorexia/bulimia; Confinement Anxiety HBO Extended History Items Eyes: Cataracts Abend, Kaleel K. ([id number]) Immunizations Pneumococcal Vaccine: Received Pneumococcal Vaccination: Yes Immunization Notes: up to date Implantable Devices Family and Social History Cancer: No; Diabetes: Yes - Father; Heart Disease: Yes - Paternal Grandparents; Hereditary Spherocytosis: No; Hypertension: Yes - Mother; Kidney Disease: No; Lung Disease: Yes - Mother; Seizures: No; Stroke: No; Thyroid Problems: No; Tuberculosis: No; Never smoker; Marital Status - Married; Alcohol Use: Never; Drug Use: No History; Caffeine Use: Moderate; Financial Concerns: No; Food, Clothing or  Shelter Needs: No; Support System Lacking: No; Transportation Concerns: No; Advanced Directives: Yes (Not Provided); Patient does not want information on Advanced Directives; Medical Power of Attorney: Yes - Tammy Rezas (Not Provided) Physician Affirmation I have reviewed and agree with the above information. Electronic Signature(s) Signed: 01/15/2018 5:00:45 PM By: Montey Hora Signed: 01/16/2018 8:03:16 PM By: Worthy Keeler PA-C Entered By: Worthy Keeler on 01/15/2018 13:49:44 Barkow, [name] ([id number]) -------------------------------------------------------------------------------- SuperBill Details Patient Name: [name]. Date of Service: 01/15/2018 Medical Record Number: [id number] Patient Account Number: [id number] Date of Birth/Sex: [birth year] (72 y.o. 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 I10 Essential (primary) hypertension Facility Procedures CPT4 Code: 28315176 Description: 99213 - WOUND CARE VISIT-LEV 3 EST PT Modifier: Quantity: 1 Physician Procedures CPT4: Description Modifier Quantity Code 1607371 06269 - WC PHYS LEVEL 3 - EST PT 1 ICD-10 Diagnosis Description S85.462V Unspecified open wound, right lower leg, sequela L97.816 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  involvement without evidence of necrosis I10 Essential (primary) hypertension Electronic Signature(s) Signed: 01/16/2018 8:03:16 PM By: Worthy Keeler PA-C Entered By: Worthy Keeler on 01/15/2018 13:52:19

## 2018-01-28 ENCOUNTER — Encounter: Payer: PPO | Admitting: Physician Assistant

## 2018-01-28 DIAGNOSIS — L97816 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 involvement without evidence of necrosis: Secondary | ICD-10-CM | POA: Diagnosis not present

## 2018-01-28 DIAGNOSIS — L97826 Non-pressure chronic ulcer of other part of left lower leg with bone involvement without evidence of necrosis: Secondary | ICD-10-CM | POA: Diagnosis not present

## 2018-01-29 DIAGNOSIS — S81802A Unspecified open wound, left lower leg, initial encounter: Secondary | ICD-10-CM | POA: Diagnosis not present

## 2018-02-01 NOTE — Progress Notes (Addendum)
[name], [name] ([id number]) Visit Report for 01/28/2018 Arrival Information Details Patient Name: [name], [name]. Date of Service: 01/28/2018 3:45 PM Medical Record Number: [id number] Patient Account Number: [id number] Date of Birth/Sex: 1945/11/28 (72 y.o. M) Treating RN: Cornell Barman Primary Care Deone Omahoney: Elsie Stain Other Clinician: Referring Aala Ransom: Elsie Stain Treating Diva Lemberger/Extender: Melburn Hake, HOYT Weeks in Treatment: 10 Visit Information History Since Last Visit Added or deleted any medications: No Patient Arrived: Ambulatory Any new allergies or adverse reactions: No Arrival Time: 15:52 Had a fall or experienced change in No Accompanied By: wife activities of daily living that may affect Transfer Assistance: None risk of falls: Patient Identification Verified: Yes Signs or symptoms of abuse/neglect since last visito No Secondary Verification Process Yes Hospitalized since last visit: No Completed: Implantable device outside of the clinic excluding No Patient Requires Transmission-Based No cellular tissue based products placed in the center Precautions: since last visit: Patient Has Alerts: Yes Has Dressing in Place as Prescribed: Yes Patient Alerts: ABI Clute BILATERAL Pain Present Now: No >220 Electronic Signature(s) Signed: 01/28/2018 5:43:13 PM By: Lorine Bears RCP, RRT, CHT Entered By: Lorine Bears on 01/28/2018 15:53:17 [name], [name] ([id number]) -------------------------------------------------------------------------------- Clinic Level of Care Assessment Details Patient Name: [name]. Date of Service: 01/28/2018 3:45 PM Medical Record Number: [id number] Patient Account Number: [id number] Date of Birth/Sex: [birth year] (72 y.o. M) Treating RN: Cornell Barman Primary Care Nasha Diss: Elsie Stain Other Clinician: Referring Rohaan Durnil: Elsie Stain Treating Del Wiseman/Extender: Melburn Hake, HOYT Weeks  in Treatment: 10 Clinic Level of Care Assessment Items TOOL 4 Quantity Score []  - Use when only an EandM is performed on FOLLOW-UP visit 0 ASSESSMENTS - Nursing Assessment / Reassessment []  - Reassessment of Co-morbidities (includes updates in patient status) 0 X- 1 5 Reassessment of Adherence to Treatment Plan ASSESSMENTS - Wound and Skin Assessment / Reassessment X - Simple Wound Assessment / Reassessment - one wound 1 5 []  - 0 Complex Wound Assessment / Reassessment - multiple wounds []  - 0 Dermatologic / Skin Assessment (not related to wound area) ASSESSMENTS - Focused Assessment []  - Circumferential Edema Measurements - multi extremities 0 []  - 0 Nutritional Assessment / Counseling / Intervention []  - 0 Lower Extremity Assessment (monofilament, tuning fork, pulses) []  - 0 Peripheral Arterial Disease Assessment (using hand held doppler) ASSESSMENTS - Ostomy and/or Continence Assessment and Care []  - Incontinence Assessment and Management 0 []  - 0 Ostomy Care Assessment and Management (repouching, etc.) PROCESS - Coordination of Care X - Simple Patient / Family Education for ongoing care 1 15 []  - 0 Complex (extensive) Patient / Family Education for ongoing care X- 1 10 Staff obtains Programmer, systems, Records, Test Results / Process Orders []  - 0 Staff telephones HHA, Nursing Homes / Clarify orders / etc []  - 0 Routine Transfer to another Facility (non-emergent condition) []  - 0 Routine Hospital Admission (non-emergent condition) []  - 0 New Admissions / Biomedical engineer / Ordering NPWT, Apligraf, etc. []  - 0 Emergency Hospital Admission (emergent condition) X- 1 10 Simple Discharge Coordination MCCALL, LOMAX. ([id number]) []  - 0 Complex (extensive) Discharge Coordination PROCESS - Special Needs []  - Pediatric / Minor Patient Management 0 []  - 0 Isolation Patient Management []  - 0 Hearing / Language / Visual special needs []  - 0 Assessment of Community  assistance (transportation, D/C planning, etc.) []  - 0 Additional assistance / Altered mentation []  - 0 Support Surface(s) Assessment (bed, cushion, seat, etc.) INTERVENTIONS - Wound Cleansing / Measurement X - Simple Wound Cleansing -  one wound 1 5 []  - 0 Complex Wound Cleansing - multiple wounds X- 1 5 Wound Imaging (photographs - any number of wounds) []  - 0 Wound Tracing (instead of photographs) X- 1 5 Simple Wound Measurement - one wound []  - 0 Complex Wound Measurement - multiple wounds INTERVENTIONS - Wound Dressings []  - Small Wound Dressing one or multiple wounds 0 X- 1 15 Medium Wound Dressing one or multiple wounds []  - 0 Large Wound Dressing one or multiple wounds []  - 0 Application of Medications - topical []  - 0 Application of Medications - injection INTERVENTIONS - Miscellaneous []  - External ear exam 0 []  - 0 Specimen Collection (cultures, biopsies, blood, body fluids, etc.) []  - 0 Specimen(s) / Culture(s) sent or taken to Lab for analysis []  - 0 Patient Transfer (multiple staff / Civil Service fast streamer / Similar devices) []  - 0 Simple Staple / Suture removal (25 or less) []  - 0 Complex Staple / Suture removal (26 or more) []  - 0 Hypo / Hyperglycemic Management (close monitor of Blood Glucose) []  - 0 Ankle / Brachial Index (ABI) - do not check if billed separately X- 1 5 Vital Signs [name], Tyler K. ([id number]) Has the patient been seen at the hospital within the last three years: Yes Total Score: 80 Level Of Care: New/Established - Level 3 Electronic Signature(s) Signed: 01/28/2018 6:22:02 PM By: Gretta Cool, BSN, RN, CWS, Kim RN, BSN Entered By: Gretta Cool, BSN, RN, CWS, Kim on 01/28/2018 16:04:36 [name] ([id number]) -------------------------------------------------------------------------------- Lower Extremity Assessment Details Patient Name: [name], [name]. Date of Service: 01/28/2018 3:45 PM Medical Record Number: [id number] Patient Account  Number: [id number] Date of Birth/Sex: 02-13-1946 (72 y.o. M) Treating RN: Cornell Barman Primary Care Genea Rheaume: Elsie Stain Other Clinician: Referring Emer Onnen: Elsie Stain Treating Demoni Parmar/Extender: Melburn Hake, HOYT Weeks in Treatment: 10 Edema Assessment Assessed: [Left: No] [Right: No] Edema: [Left: N] [Right: o] Vascular Assessment Pulses: Dorsalis Pedis Palpable: [Left:Yes] Posterior Tibial Extremity colors, hair growth, and conditions: Extremity Color: [Left:Normal] Hair Growth on Extremity: [Left:Yes] Temperature of Extremity: [Left:Warm] Capillary Refill: [Left:< 3 seconds] Toe Nail Assessment Left: Right: Thick: Yes Discolored: Yes Deformed: No Improper Length and Hygiene: No Electronic Signature(s) Signed: 01/28/2018 6:22:02 PM By: Gretta Cool, BSN, RN, CWS, Kim RN, BSN Entered By: Gretta Cool, BSN, RN, CWS, Kim on 01/28/2018 15:58:51 [name] ([id number]) -------------------------------------------------------------------------------- Multi Wound Chart Details Patient Name: [name]. Date of Service: 01/28/2018 3:45 PM Medical Record Number: [id number] Patient Account Number: [id number] Date of Birth/Sex: 12-30-1945 (72 y.o. M) Treating RN: Cornell Barman Primary Care Karem Farha: Elsie Stain Other Clinician: Referring Azaryah Oleksy: Elsie Stain Treating Grainne Knights/Extender: Melburn Hake, HOYT Weeks in Treatment: 10 Vital Signs Height(in): 74 Pulse(bpm): 72 Weight(lbs): 220 Blood Pressure(mmHg): 145/84 Body Mass Index(BMI): 28 Temperature(F): 98.3 Respiratory Rate 18 (breaths/min): Photos: [2:No Photos] [N/A:N/A] Wound Location: [2:Left Lower Leg - Medial] [N/A:N/A] Wounding Event: [2:Trauma] [N/A:N/A] Primary Etiology: [2:Trauma, Other] [N/A:N/A] Comorbid History: [2:Cataracts, Sleep Apnea, Hypertension, Osteoarthritis] [N/A:N/A] Date Acquired: [2:11/13/2016] [N/A:N/A] Weeks of Treatment: [2:10] [N/A:N/A] Wound Status: [2:Open] [N/A:N/A] Pending  Amputation on [2:Yes] [N/A:N/A] Presentation: Measurements L x W x D [2:0.9x0.3x0.2] [N/A:N/A] (cm) Area (cm) : [2:0.212] [N/A:N/A] Volume (cm) : [2:0.042] [N/A:N/A] % Reduction in Area: [2:35.80%] [N/A:N/A] % Reduction in Volume: [2:57.60%] [N/A:N/A] Classification: [2:Full Thickness With Exposed Support Structures] [N/A:N/A] Exudate Amount: [2:Medium] [N/A:N/A] Exudate Type: [2:Serous] [N/A:N/A] Exudate Color: [2:amber] [N/A:N/A] Wound Margin: [2:Flat and Intact] [N/A:N/A] Granulation Amount: [2:None Present (0%)] [N/A:N/A] Necrotic Amount: [2:Large (67-100%)] [N/A:N/A] Exposed Structures: [2:Bone: Yes Fascia: No Fat  Layer (Subcutaneous Tissue) Exposed: No Tendon: No Muscle: No Joint: No] [N/A:N/A] Epithelialization: [2:None] [N/A:N/A] Periwound Skin Texture: [2:Scarring: Yes Excoriation: No Induration: No Callus: No] [N/A:N/A] Crepitus: No Rash: No Periwound Skin Moisture: Dry/Scaly: Yes N/A N/A Maceration: No Periwound Skin Color: Erythema: Yes N/A N/A Atrophie Blanche: No Cyanosis: No Ecchymosis: No Hemosiderin Staining: No Mottled: No Pallor: No Rubor: No Erythema Location: Circumferential N/A N/A Temperature: No Abnormality N/A N/A Tenderness on Palpation: Yes N/A N/A Wound Preparation: Ulcer Cleansing: N/A N/A Rinsed/Irrigated with Saline Topical Anesthetic Applied: Other: lidocaine 4% Treatment Notes Electronic Signature(s) Signed: 01/28/2018 6:22:02 PM By: Gretta Cool, BSN, RN, CWS, Kim RN, BSN Entered By: Gretta Cool, BSN, RN, CWS, Kim on 01/28/2018 16:03:16 [name] ([id number]) -------------------------------------------------------------------------------- [facility] Details Patient Name: [name], [name]. Date of Service: 01/28/2018 3:45 PM Medical Record Number: [id number] Patient Account Number: [id number] Date of Birth/Sex: 04-Dec-1945 (72 y.o. M) Treating RN: Cornell Barman Primary Care Miasia Crabtree: Elsie Stain Other  Clinician: Referring Aarini Slee: Elsie Stain Treating Raelene Trew/Extender: Sharalyn Ink in Treatment: 10 Active Inactive Electronic Signature(s) Signed: 02/21/2018 5:53:05 PM By: Gretta Cool, BSN, RN, CWS, Kim RN, BSN Previous Signature: 01/28/2018 6:22:02 PM Version By: Gretta Cool, BSN, RN, CWS, Kim RN, BSN Entered By: Gretta Cool, BSN, RN, CWS, Kim on 02/21/2018 17:53:05 [name] ([id number]) -------------------------------------------------------------------------------- Pain Assessment Details Patient Name: [name], [name]. Date of Service: 01/28/2018 3:45 PM Medical Record Number: [id number] Patient Account Number: [id number] Date of Birth/Sex: 06-Jul-1945 (72 y.o. M) Treating RN: Cornell Barman Primary Care Jesenya Bowditch: Elsie Stain Other Clinician: Referring Costantino Kohlbeck: Elsie Stain Treating Bertha Earwood/Extender: Melburn Hake, HOYT Weeks in Treatment: 10 Active Problems Location of Pain Severity and Description of Pain Patient Has Paino No Site Locations Pain Management and Medication Current Pain Management: Electronic Signature(s) Signed: 01/28/2018 5:43:13 PM By: Lorine Bears RCP, RRT, CHT Signed: 01/28/2018 6:22:02 PM By: Gretta Cool, BSN, RN, CWS, Kim RN, BSN Entered By: Lorine Bears on 01/28/2018 15:53:24 [name] ([id number]) -------------------------------------------------------------------------------- Patient/Caregiver Education Details Patient Name: [name] Date of Service: 01/28/2018 3:45 PM Medical Record Number: [id number] Patient Account Number: [id number] Date of Birth/Gender: Jan 17, 1946 (72 y.o. M) Treating RN: Cornell Barman Primary Care Physician: Elsie Stain Other Clinician: Referring Physician: Elsie Stain Treating Physician/Extender: Sharalyn Ink in Treatment: 10 Education Assessment Education Provided To: Patient Education Topics Provided Wound/Skin Impairment: Handouts: Caring for Your  Ulcer Methods: Demonstration, Explain/Verbal Responses: State content correctly Electronic Signature(s) Signed: 01/28/2018 6:22:02 PM By: Gretta Cool, BSN, RN, CWS, Kim RN, BSN Entered By: Gretta Cool, BSN, RN, CWS, Kim on 01/28/2018 16:07:14 [name] ([id number]) -------------------------------------------------------------------------------- Wound Assessment Details Patient Name: [name], [name]. Date of Service: 01/28/2018 3:45 PM Medical Record Number: [id number] Patient Account Number: [id number] Date of Birth/Sex: 1946-01-14 (72 y.o. M) Treating RN: Cornell Barman Primary Care Konnor Jorden: Elsie Stain Other Clinician: Referring Nil Bolser: Elsie Stain Treating Megan Presti/Extender: Melburn Hake, HOYT Weeks in Treatment: 10 Wound Status Wound Number: 2 Primary Trauma, Other Etiology: Wound Location: Left Lower Leg - Medial Wound Status: Open Wounding Event: Trauma Comorbid Cataracts, Sleep Apnea, Hypertension, Date Acquired: 11/13/2016 History: Osteoarthritis Weeks Of Treatment: 10 Clustered Wound: No Pending Amputation On Presentation Photos Photo Uploaded By: Gretta Cool, BSN, RN, CWS, Kim on 01/28/2018 17:58:59 Wound Measurements Length: (cm) 0.9 Width: (cm) 0.3 Depth: (cm) 0.2 Area: (cm) 0.212 Volume: (cm) 0.042 % Reduction in Area: 35.8% % Reduction in Volume: 57.6% Epithelialization: None Tunneling: No Undermining: No Wound Description Full Thickness With Exposed Support Foul Odor Classification: Structures Slough/Fi Wound Margin:  Flat and Intact Exudate None Present Amount: After Cleansing: No brino No Wound Bed Granulation Amount: None Present (0%) Exposed Structure Necrotic Amount: Large (67-100%) Fascia Exposed: No Necrotic Quality: Adherent Slough Fat Layer (Subcutaneous Tissue) Exposed: No Tendon Exposed: No Muscle Exposed: No Joint Exposed: No Bone Exposed: Yes Periwound Skin Texture [name], Tyler K. ([id number]) Texture Color No Abnormalities  Noted: No No Abnormalities Noted: No Callus: No Atrophie Blanche: No Crepitus: No Cyanosis: No Excoriation: No Ecchymosis: No Induration: No Erythema: Yes Rash: No Erythema Location: Circumferential Scarring: Yes Hemosiderin Staining: No Mottled: No Moisture Pallor: No No Abnormalities Noted: No Rubor: No Dry / Scaly: Yes Maceration: No Temperature / Pain Temperature: No Abnormality Tenderness on Palpation: Yes Wound Preparation Ulcer Cleansing: Rinsed/Irrigated with Saline Topical Anesthetic Applied: Other: lidocaine 4%, Electronic Signature(s) Signed: 01/28/2018 6:22:02 PM By: Gretta Cool, BSN, RN, CWS, Kim RN, BSN Entered By: Gretta Cool, BSN, RN, CWS, Kim on 01/28/2018 16:09:12 [name] ([id number]) -------------------------------------------------------------------------------- Vitals Details Patient Name: [name] Date of Service: 01/28/2018 3:45 PM Medical Record Number: [id number] Patient Account Number: [id number] Date of Birth/Sex: Sep 14, 1945 (72 y.o. M) Treating RN: Cornell Barman Primary Care Yamira Papa: Elsie Stain Other Clinician: Referring Kyannah Climer: Elsie Stain Treating Kendrea Cerritos/Extender: Melburn Hake, HOYT Weeks in Treatment: 10 Vital Signs Time Taken: 16:53 Temperature (F): 98.3 Height (in): 74 Pulse (bpm): 72 Weight (lbs): 220 Respiratory Rate (breaths/min): 18 Body Mass Index (BMI): 28.2 Blood Pressure (mmHg): 145/84 Reference Range: 80 - 120 mg / dl Electronic Signature(s) Signed: 01/28/2018 5:43:13 PM By: Lorine Bears RCP, RRT, CHT Entered By: Lorine Bears on 01/28/2018 15:55:52

## 2018-02-01 NOTE — Progress Notes (Signed)
ALTONIO, SCHWERTNER ([id number]) Visit Report for 01/28/2018 Chief Complaint Document Details Patient Name: [name], [name]. Date of Service: 01/28/2018 3:45 PM Medical Record Number: [id number] Patient Account Number: [id number] Date of Birth/Sex: [birth year] (72 y.o. M) Treating RN: Cornell Barman Primary Care Provider: Elsie Stain Other Clinician: Referring Provider: Elsie Stain Treating Provider/Extender: Melburn Hake, Avyukt Cimo Weeks in Treatment: 10 Information Obtained from: Patient Chief Complaint Left leg ulcer Electronic Signature(s) Signed: 01/30/2018 11:13:36 AM By: Worthy Keeler PA-C Entered By: Worthy Keeler on 01/28/2018 16:17:03 [name] ([id number]) -------------------------------------------------------------------------------- HPI Details Patient Name: [name] Date of Service: 01/28/2018 3:45 PM Medical Record Number: [id number] Patient Account Number: [id number] Date of Birth/Sex: 09/30/1945 (72 y.o. M) Treating RN: Cornell Barman Primary Care Provider: Elsie Stain Other Clinician: Referring Provider: Elsie Stain Treating Provider/Extender: Melburn Hake, Delaney Schnick Weeks in Treatment: 10 History of Present Illness HPI Description: 11/13/17 patient presents today for initial evaluation and our clinic due to an issue he is having with his left lower extremity in the anterior portion. In 1955 he had a significant accident where he fractured his leg and subsequently had the had reconstructive surgery in this location. Nonetheless he states he has done fairly well over the years that time without any complication speak of. Subsequently when year ago he had some trauma potentially occurred to the site which led to an opening that really has not closed since that time. He really does not know what the trauma was in fact he tells me that he doesn't remember anything in particular it is possible this might've been something that just opened up on its own he  really does not know in the end. Nonetheless he's not really had any discomfort or pain although it does appear that there is bone exposed base of the wound. Being on the anterior shin overlying the tibia and with the thin tissue in this area anyway this is always going to be a difficult place to deal. The ulcer itself seems to have opened up along the surgical incision site. 11/19/17 on evaluation today I did have patient's culture available for review and it did appear that his culture was negative for any bacteria just normal skin flora was noted. This makes me think that the fragment of bone which was lifting off was actually not infected but rather just flaking because the bone was to dry with the dressings that were place. Nonetheless I do think he's doing much better today the bone seems to be staying much more moist and I think is better quality overall. In general I'm very happy with the overall appearance of the wound today compared to previous there's little bit of erythema there may be just slight cellulitis surrounding the wound bed this was discussed with the patient. He does have doxycycline 100 mg on hand already which I think he can take and he is going to go ahead and start doing this. His MRI is scheduled for the 10th of this month and five days. 12/03/17 on evaluation today patient appears to be doing rather well in regard to his lower Trinity ulcer. He really has not showed much in the way of improvement in regard to the overall size of the wound. With that being said he still have bone noted in the base of the wound there's really no granulation at this point. I still think the Theraskin would be the ideal thing for him. 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 There is no evidence of ongoing osteomyelitis, abscess, or soft tissue inflammation which was good news upset do not think there's any evidence of  significant infection at this point. 12/10/17 on evaluation today patient presents for follow-up concerning his ongoing left anterior lower extremity ulcer. The good news is his wound does not appear to be any larger today upon evaluation. The unfortunate news is that he continues to really show no signs of improvement in fact the wound actually appears to be essentially unchanged in size today compared to my valuation last week. Again we were having to go forward with additional wound care in order to ensure that there was no progress or change with traditional wound care measures prior to being able to resubmit for the Theraskin which I think would be most appropriate thing for him at this time. Nonetheless based on the fact that we really had no change in the overall wound size or condition over the past month that I have been caring for him since 11/13/17 I think that is definitely appropriate for [place] to proceed with the Theraskin approval. 12/20/17- He is here for follow up evaluation for a left anterior leg. There is noted increased erythema when compared to photo documentation and per patient/spouse report. There is concern in placing theraskin with questionable infection; culture was taken. Theraskin was not place today, he will continue with current treatment plan and he will follow up next week. 12/27/17 on evaluation today patient actually appears to be doing very well in regard to his wound. There's really no change but again I do not see the erythema that characterized last week's evaluation. In general I feel like things are showing signs of improvement. At this point my hope is that the patient will be able to have the [place] in place between now and when I see him back in roughly 2 1/2 weeks time. In that time I'm hoping that the skin graft will actually be able to take to some degree we may need to repeat this I'll be see that was explained to the patient as well. Nonetheless he is  hopeful that this will be of benefit and I agree that this is probably his best chance of trying to get this thing to heal. He knows how to take care of this including keeping it clean and dry as well. RAZIEL, KOENIGS ([id number]) 01/15/18 on evaluation today patient actually appears to be doing very well in regard to his lower extremity ulcer as far as nothing having worsened over the past 2 1/2 weeks since I last saw him. Unfortunately he also has not really shown any signs of improvement at this time. With that being said we may have some options as far as trying something a little different to see if this can be of benefit for the patient. 01/28/18 on evaluation today patient actually appears to be doing about the same in regard to his anterior lower extremity ulcer. He's been tolerating the dressing changes unfortunately nothing seems to really be making a big improvement overall. We did check on the possibility of Grafix PL for the patient. However he would have a $250 co-pay for each application if we were to proceed with this. Subsequently I am not even sure that this will be something beneficial for him in the end anyway. It's possible that just like everything else we tried up to this point he would still have issues with nothing stimulating growth.  Again bone is exposed fortunately there's no osteomyelitis but unfortunately were having issues with getting things to cover over the open wound area. Electronic Signature(s) Signed: 01/30/2018 11:13:36 AM By: Worthy Keeler PA-C Entered By: Worthy Keeler on 01/29/2018 08:48:00 [name] ([id number]) -------------------------------------------------------------------------------- Physical Exam Details Patient Name: [name], [name]. Date of Service: 01/28/2018 3:45 PM Medical Record Number: [id number] Patient Account Number: [id number] Date of Birth/Sex: Aug 29, 1945 (72 y.o. M) Treating RN: Cornell Barman Primary Care  Provider: Elsie Stain Other Clinician: Referring Provider: Elsie Stain Treating Provider/Extender: Melburn Hake, Louine Tenpenny Weeks in Treatment: 10 Constitutional Well-nourished and well-hydrated in no acute distress. Respiratory normal breathing without difficulty. clear to auscultation bilaterally. Cardiovascular regular rate and rhythm with normal S1, S2. Psychiatric this patient is able to make decisions and demonstrates good insight into disease process. Alert and Oriented x 3. pleasant and cooperative. Notes At this time patient's wound bed did not require any sharp debridement. I do think that with the bone still exposed his best option would likely be to see a plastic surgeon to talk about options for surgical closure. Electronic Signature(s) Signed: 01/30/2018 11:13:36 AM By: Worthy Keeler PA-C Entered By: Worthy Keeler on 01/29/2018 08:48:29 [name] ([id number]) -------------------------------------------------------------------------------- Physician Orders Details Patient Name: [name] Date of Service: 01/28/2018 3:45 PM Medical Record Number: [id number] Patient Account Number: [id number] Date of Birth/Sex: 20-Apr-1945 (72 y.o. M) Treating RN: Cornell Barman Primary Care Provider: Elsie Stain Other Clinician: Referring Provider: Elsie Stain Treating Provider/Extender: Melburn Hake, Asim Gersten Weeks in Treatment: 10 Verbal / Phone Orders: No Diagnosis Coding Wound Cleansing Wound #2 Left,Medial Lower Leg o Clean wound with Normal Saline. Anesthetic (add to Medication List) Wound #2 Left,Medial Lower Leg o Topical Lidocaine 4% cream applied to wound bed prior to debridement (In Clinic Only). Skin Barriers/Peri-Wound Care Wound #2 Left,Medial Lower Leg o Skin Prep Primary Wound Dressing Wound #2 Left,Medial Lower Leg o Silver Collagen Secondary Dressing Wound #2 Left,Medial Lower Leg o Boardered Foam Dressing Dressing Change  Frequency Wound #2 Left,Medial Lower Leg o Three times weekly Follow-up Appointments Wound #2 Left,Medial Lower Leg o Return Appointment in 2 weeks. Edema Control Wound #2 Left,Medial Lower Leg o Elevate legs to the level of the heart and pump ankles as often as possible Consults o Plastic Surgery - Left Lower Leg Electronic Signature(s) Signed: 01/28/2018 6:22:02 PM By: Gretta Cool, BSN, RN, CWS, Kim RN, BSN Signed: 01/30/2018 11:13:36 AM By: Worthy Keeler PA-C Entered By: Gretta Cool, BSN, RN, CWS, Kim on 01/28/2018 16:06:16 [name], [name] ([id number]) [name], [name] ([id number]) -------------------------------------------------------------------------------- Problem List Details Patient Name: [name], [name]. Date of Service: 01/28/2018 3:45 PM Medical Record Number: [id number] Patient Account Number: [id number] Date of Birth/Sex: 11/16/1945 (72 y.o. 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 ([id number]) -------------------------------------------------------------------------------- Progress Note Details Patient Name: [name]. Date of Service: 01/28/2018 3:45 PM Medical Record Number: [id number] Patient Account Number: [id number] Date of Birth/Sex:  1945/12/01 (72 y.o. M) Treating  RN: Cornell Barman Primary Care Provider: Elsie Stain Other Clinician: Referring Provider: Elsie Stain Treating Provider/Extender: Melburn Hake, Ehan Freas Weeks in Treatment: 10 Subjective Chief Complaint Information obtained from Patient Left leg ulcer History of Present Illness (HPI) 11/13/17 patient presents today for initial evaluation and our clinic due to an issue he is having with his left lower extremity in the anterior portion. In 1955 he had a significant accident where he fractured his leg and subsequently had the had reconstructive surgery in this location. Nonetheless he states he has done fairly well over the years that time without any complication speak of. Subsequently when year ago he had some trauma potentially occurred to the site which led to an opening that really has not closed since that time. He really does not know what the trauma was in fact he tells me that he doesn't remember anything in particular it is possible this might've been something that just opened up on its own he really does not know in the end. Nonetheless he's not really had any discomfort or pain although it does appear that there is bone exposed base of the wound. Being on the anterior shin overlying the tibia and with the thin tissue in this area anyway this is always going to be a difficult place to deal. The ulcer itself seems to have opened up along the surgical incision site. 11/19/17 on evaluation today I did have patient's culture available for review and it did appear that his culture was negative for any bacteria just normal skin flora was noted. This makes me think that the fragment of bone which was lifting off was actually not infected but rather just flaking because the bone was to dry with the dressings that were place. Nonetheless I do think he's doing much better today the bone seems to be staying much more moist and I think is better quality overall. In general I'm very happy with the  overall appearance of the wound today compared to previous there's little bit of erythema there may be just slight cellulitis surrounding the wound bed this was discussed with the patient. He does have doxycycline 100 mg on hand already which I think he can take and he is going to go ahead and start doing this. His MRI is scheduled for the 10th of this month and five days. 12/03/17 on evaluation today patient appears to be doing rather well in regard to his lower Trinity ulcer. He really has not showed much in the way of improvement in regard to the overall size of the wound. With that being said he still have bone noted in the base of the wound there's really no granulation at this point. I still think the Theraskin would be the ideal thing for him. He did have the MRI of his lower extremity which showed posttraumatic deformities of the left tibia and fibula. There is no evidence of ongoing osteomyelitis, abscess, or soft tissue inflammation which was good news upset do not think there's any evidence of significant infection at this point. 12/10/17 on evaluation today patient presents for follow-up concerning his ongoing left anterior lower extremity ulcer. The good news is his wound does not appear to be any larger today upon evaluation. The unfortunate news is that he continues to really show no signs of improvement in fact the wound actually appears to be essentially unchanged in size today compared to my valuation last week. Again we were having to go forward with  additional wound care in order to ensure that there was no progress or change with traditional wound care measures prior to being able to resubmit for the Theraskin which I think would be most appropriate thing for him at this time. Nonetheless based on the fact that we really had no change in the overall wound size or condition over the past month that I have been caring for him since 11/13/17 I think that is definitely appropriate for  [place] to proceed with the Theraskin approval. 12/20/17- He is here for follow up evaluation for a left anterior leg. There is noted increased erythema when compared to photo documentation and per patient/spouse report. There is concern in placing theraskin with questionable infection; culture was taken. Theraskin was not place today, he will continue with current treatment plan and he will follow up next week. 12/27/17 on evaluation today patient actually appears to be doing very well in regard to his wound. There's really no change but again I do not see the erythema that characterized last week's evaluation. In general I feel like things are showing signs of [name], Tyler K. ([id number]) improvement. At this point my hope is that the patient will be able to have the [place] in place between now and when I see him back in roughly 2 1/2 weeks time. In that time I'm hoping that the skin graft will actually be able to take to some degree we may need to repeat this I'll be see that was explained to the patient as well. Nonetheless he is hopeful that this will be of benefit and I agree that this is probably his best chance of trying to get this thing to heal. He knows how to take care of this including keeping it clean and dry as well. 01/15/18 on evaluation today patient actually appears to be doing very well in regard to his lower extremity ulcer as far as nothing having worsened over the past 2 1/2 weeks since I last saw him. Unfortunately he also has not really shown any signs of improvement at this time. With that being said we may have some options as far as trying something a little different to see if this can be of benefit for the patient. 01/28/18 on evaluation today patient actually appears to be doing about the same in regard to his anterior lower extremity ulcer. He's been tolerating the dressing changes unfortunately nothing seems to really be making a big improvement overall. We did check  on the possibility of Grafix PL for the patient. However he would have a $250 co-pay for each application if we were to proceed with this. Subsequently I am not even sure that this will be something beneficial for him in the end anyway. It's possible that just like everything else we tried up to this point he would still have issues with nothing stimulating growth. Again bone is exposed fortunately there's no osteomyelitis but unfortunately were having issues with getting things to cover over the open wound area. Patient History Information obtained from Patient. Family History Diabetes - Father, Heart Disease - Paternal Grandparents, Hypertension - Mother, Lung Disease - Mother, No family history of Cancer, Hereditary Spherocytosis, Kidney Disease, Seizures, Stroke, Thyroid Problems, Tuberculosis. Social History Never smoker, Marital Status - Married, Alcohol Use - Never, Drug Use - No History, Caffeine Use - Moderate. Medical And Surgical History Notes Cardiovascular heart murmur Genitourinary HSV Review of Systems (ROS) Constitutional Symptoms (General Health) Denies complaints or symptoms of Fatigue, Fever, Chills. Respiratory The  patient has no complaints or symptoms. Cardiovascular The patient has no complaints or symptoms. Psychiatric The patient has no complaints or symptoms. Objective Constitutional Well-nourished and well-hydrated in no acute distress. [name], [name] ([id number]) Vitals Time Taken: 4:53 PM, Height: 74 in, Weight: 220 lbs, BMI: 28.2, Temperature: 98.3 F, Pulse: 72 bpm, Respiratory Rate: 18 breaths/min, Blood Pressure: 145/84 mmHg. Respiratory normal breathing without difficulty. clear to auscultation bilaterally. Cardiovascular regular rate and rhythm with normal S1, S2. Psychiatric this patient is able to make decisions and demonstrates good insight into disease process. Alert and Oriented x 3. pleasant and cooperative. General Notes: At this  time patient's wound bed did not require any sharp debridement. I do think that with the bone still exposed his best option would likely be to see a plastic surgeon to talk about options for surgical closure. Integumentary (Hair, Skin) Wound #2 status is Open. Original cause of wound was Trauma. The wound is located on the Left,Medial Lower Leg. The wound measures 0.9cm length x 0.3cm width x 0.2cm depth; 0.212cm^2 area and 0.042cm^3 volume. There is bone exposed. There is no tunneling or undermining noted. There is a none present amount of drainage noted. The wound margin is flat and intact. There is no granulation within the wound bed. There is a large (67-100%) amount of necrotic tissue within the wound bed including Adherent Slough. The periwound skin appearance exhibited: Scarring, Dry/Scaly, Erythema. The periwound skin appearance did not exhibit: Callus, Crepitus, Excoriation, Induration, Rash, Maceration, Atrophie Blanche, Cyanosis, Ecchymosis, Hemosiderin Staining, Mottled, Pallor, Rubor. The surrounding wound skin color is noted with erythema which is circumferential. Periwound temperature was noted as No Abnormality. The periwound has tenderness on palpation. 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 Anesthetic (add to Medication List): Wound #2 Left,Medial Lower Leg: Topical Lidocaine 4% cream applied to wound bed prior to debridement (In Clinic Only). Skin Barriers/Peri-Wound Care: Wound #2 Left,Medial Lower Leg: Skin Prep Primary Wound Dressing: [name], [name] ([id number]) Wound #2 Left,Medial Lower Leg: Silver Collagen Secondary Dressing: Wound #2 Left,Medial Lower Leg: Boardered Foam  Dressing Dressing Change Frequency: Wound #2 Left,Medial Lower Leg: Three times weekly Follow-up Appointments: Wound #2 Left,Medial Lower Leg: Return Appointment in 2 weeks. Edema Control: Wound #2 Left,Medial Lower Leg: Elevate legs to the level of the heart and pump ankles as often as possible Consults ordered were: Plastic Surgery - Left Lower Leg At this point my suggestion is gonna be that we go ahead and proceed with sin and the patient to plastic surgery for evaluation and treatment. He is actually in agreement with this plan. We will subsequently see what we can do as far as finding the best spot for him to go he does want to check on the place [facility] as far as referral is concerned we can definitely check into this. Nonetheless my hope is that will be able to get him in for the referral soon so they can evaluate and see if there's anything potentially they could do to help with surgical closure of this wound since we have not been able to get anything progressing with the current wound care measures and attempts that we have made. Please see above for specific wound care orders. We will see patient for re-evaluation in 2 week(s) here in the  clinic. If anything worsens or changes patient will contact our office for additional recommendations. Electronic Signature(s) Signed: 01/30/2018 11:13:36 AM By: Worthy Keeler PA-C Entered By: Worthy Keeler on 01/29/2018 08:49:34 [name] ([id number]) -------------------------------------------------------------------------------- ROS/PFSH Details Patient Name: [name] Date of Service: 01/28/2018 3:45 PM Medical Record Number: [id number] Patient Account Number: [id number] Date of Birth/Sex: 04/08/1946 (72 y.o. M) Treating RN: Cornell Barman Primary Care Provider: Elsie Stain Other Clinician: Referring Provider: Elsie Stain Treating Provider/Extender: Melburn Hake, Tremon Sainvil Weeks in Treatment: 10 Information  Obtained From Patient Wound History Do you currently have one or more open woundso Yes How many open wounds do you currently haveo 1 Approximately how long have you had your woundso 1 year How have you been treating your wound(s) until nowo nuskin Has your wound(s) ever healed and then re-openedo No Have you had any lab work done in the past montho No Have you tested positive for an antibiotic resistant organism (MRSA, VRE)o No Have you tested positive for osteomyelitis (bone infection)o No Have you had any tests for circulation on your legso No Constitutional Symptoms (General Health) Complaints and Symptoms: Negative for: Fatigue; Fever; Chills Eyes Medical History: Positive for: Cataracts - removed Negative for: Glaucoma; Optic Neuritis Ear/Nose/Mouth/Throat Medical History: Negative for: Chronic sinus problems/congestion; Middle ear problems Hematologic/Lymphatic Medical History: Negative for: Anemia; Hemophilia; Human Immunodeficiency Virus; Lymphedema; Sickle Cell Disease Respiratory Complaints and Symptoms: No Complaints or Symptoms Medical History: Positive for: Sleep Apnea - CPAP Negative for: Aspiration; Asthma; Chronic Obstructive Pulmonary Disease (COPD); Pneumothorax; Tuberculosis Cardiovascular Complaints and Symptoms: No Complaints or Symptoms Medical History: Positive for: Hypertension [name], Tyler K. ([id number]) Negative for: Angina; Arrhythmia; Congestive Heart Failure; Coronary Artery Disease; Deep Vein Thrombosis; Hypotension; Myocardial Infarction; Peripheral Arterial Disease; Peripheral Venous Disease; Phlebitis; Vasculitis Past Medical History Notes: heart murmur Gastrointestinal Medical History: Negative for: Cirrhosis ; Colitis; Crohnos; Hepatitis A; Hepatitis B; Hepatitis C Endocrine Medical History: Negative for: Type I Diabetes; Type II Diabetes Genitourinary Medical History: Negative for: End Stage Renal Disease Past Medical History  Notes: HSV Immunological Medical History: Negative for: Lupus Erythematosus; Raynaudos; Scleroderma Integumentary (Skin) Medical History: Negative for: History of Burn; History of pressure wounds Musculoskeletal Medical History: Positive for: Osteoarthritis Negative for: Gout; Rheumatoid Arthritis Neurologic Medical History: Negative for: Dementia; Neuropathy; Quadriplegia; Paraplegia; Seizure Disorder Oncologic Medical History: Negative for: Received Chemotherapy; Received Radiation Psychiatric Complaints and Symptoms: No Complaints or Symptoms Medical History: Negative for: Anorexia/bulimia; Confinement Anxiety HBO Extended History Items Eyes: Cataracts [name], Tyler K. ([id number]) Immunizations Pneumococcal Vaccine: Received Pneumococcal Vaccination: Yes Immunization Notes: up to date Implantable Devices Family and Social History Cancer: No; Diabetes: Yes - Father; Heart Disease: Yes - Paternal Grandparents; Hereditary Spherocytosis: No; Hypertension: Yes - Mother; Kidney Disease: No; Lung Disease: Yes - Mother; Seizures: No; Stroke: No; Thyroid Problems: No; Tuberculosis: No; Never smoker; Marital Status - Married; Alcohol Use: Never; Drug Use: No History; Caffeine Use: Moderate; Financial Concerns: No; Food, Clothing or Shelter Needs: No; Support System Lacking: No; Transportation Concerns: No; Advanced Directives: Yes (Not Provided); Patient does not want information on Advanced Directives; Medical Power of Attorney: Yes - Tammy Rezas (Not Provided) Physician Affirmation I have reviewed and agree with the above information. Electronic Signature(s) Signed: 01/30/2018 11:13:36 AM By: Worthy Keeler PA-C Signed: 01/30/2018 5:17:02 PM By: Gretta Cool, BSN, RN, CWS, Kim RN, BSN Entered By: Worthy Keeler on 01/29/2018 08:48:14 [name] ([id number]) -------------------------------------------------------------------------------- SuperBill Details Patient  Name: [name], [name]. Date of Service: 01/28/2018  Medical Record Number: [id number] Patient Account Number: [id number] Date of Birth/Sex: February 20, 1946 (72 y.o. 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 without evidence of L97.816 necrosis I10 Essential (primary) hypertension Facility Procedures CPT4 Code: 28638177 Description: 99213 - WOUND CARE VISIT-LEV 3 EST PT Modifier: Quantity: 1 Physician Procedures CPT4: Description Modifier Quantity Code 1165790 38333 - WC PHYS LEVEL 3 - EST PT 1 ICD-10 Diagnosis Description O32.919T Unspecified open wound, right lower leg, sequela L97.816 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  involvement without evidence of necrosis I10 Essential (primary) hypertension Electronic Signature(s) Signed: 01/30/2018 11:13:36 AM By: Worthy Keeler PA-C Entered By: Worthy Keeler on 01/29/2018 08:49:45

## 2018-02-14 DIAGNOSIS — L97924 Non-pressure chronic ulcer of unspecified part of left lower leg with necrosis of bone: Secondary | ICD-10-CM | POA: Diagnosis not present
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
## 2018-02-19 NOTE — Pre-Procedure Instructions (Signed)
EDYN QAZI  02/19/2018      [id number] - [place] [address], [place] - [address] [place] [address] Phone: [phone] Fax: [phone]    Your procedure is scheduled on Mon. Nov. 11, 2019 from 7:03AM-8:15AM  Report to [facility] Admitting Entrance "A" at 5:30AM  Call this number if you have problems the morning of surgery:  [phone]   Remember:  Do not eat or drink after midnight on Nov. 10th    Take these medicines the morning of surgery with A SIP OF WATER: If needed: Cetirizine (ZYRTEC), Cyclobenzaprine (FLEXERIL), Omeprazole (PRILOSEC), and  Oxybutynin (DITROPAN)   As of today, stop taking all Other Aspirin Products, Vitamins, Fish oils, and Herbal medications. Also stop all NSAIDS i.e. Advil, Ibuprofen, Motrin, Aleve, Anaprox, Naproxen, BC, Goody Powders, and all Supplements. Including: Meloxicam (MOBIC)    Do not wear jewelry.  Do not wear lotions, powders, colognes, or deodorant.  Do not shave 48 hours prior to surgery.  Men may shave face.  Do not bring valuables to the hospital.  [facility] is not responsible for any belongings or valuables.  Contacts, dentures or bridgework may not be worn into surgery.  Leave your suitcase in the car.  After surgery it may be brought to your room.  For patients admitted to the hospital, discharge time will be determined by your treatment team.  Patients discharged the day of surgery will not be allowed to drive home.   Special instructions:   [facility]- Preparing For Surgery  Before surgery, you can play an important role. Because skin is not sterile, your skin needs to be as free of germs as possible. You can reduce the number of germs on your skin by washing with CHG (chlorahexidine gluconate) Soap before surgery.  CHG is an antiseptic cleaner which kills germs and bonds with the skin to continue killing germs even after washing.    Oral Hygiene is also important to reduce  your risk of infection.  Remember - BRUSH YOUR TEETH THE MORNING OF SURGERY WITH YOUR REGULAR TOOTHPASTE  Please do not use if you have an allergy to CHG or antibacterial soaps. If your skin becomes reddened/irritated stop using the CHG.  Do not shave (including legs and underarms) for at least 48 hours prior to first CHG shower. It is OK to shave your face.  Please follow these instructions carefully.   1. Shower the NIGHT BEFORE SURGERY and the MORNING OF SURGERY with CHG.   2. If you chose to wash your hair, wash your hair first as usual with your normal shampoo.  3. After you shampoo, rinse your hair and body thoroughly to remove the shampoo.  4. Use CHG as you would any other liquid soap. You can apply CHG directly to the skin and wash gently with a scrungie or a clean washcloth.   5. Apply the CHG Soap to your body ONLY FROM THE NECK DOWN.  Do not use on open wounds or open sores. Avoid contact with your eyes, ears, mouth and genitals (private parts). Wash Face and genitals (private parts)  with your normal soap.  6. Wash thoroughly, paying special attention to the area where your surgery will be performed.  7. Thoroughly rinse your body with warm water from the neck down.  8. DO NOT shower/wash with your normal soap after using and rinsing off the CHG Soap.  9. Pat yourself dry with a CLEAN TOWEL.  10. Wear  CLEAN PAJAMAS to bed the night before surgery, wear comfortable clothes the morning of surgery  11. Place CLEAN SHEETS on your bed the night of your first shower and DO NOT SLEEP WITH PETS.  Day of Surgery:  Do not apply any deodorants/lotions.  Please wear clean clothes to the hospital/surgery center.   Remember to brush your teeth WITH YOUR REGULAR TOOTHPASTE.  Please read over the following fact sheets that you were given. Pain Booklet, Coughing and Deep Breathing and Surgical Site Infection Prevention

## 2018-02-20 ENCOUNTER — Other Ambulatory Visit: Payer: Self-pay

## 2018-02-20 ENCOUNTER — Encounter (HOSPITAL_BASED_OUTPATIENT_CLINIC_OR_DEPARTMENT_OTHER)
Admission: RE | Admit: 2018-02-20 | Discharge: 2018-02-20 | Disposition: A | Discharge status: Home / Self Care | Payer: PPO | Source: Ambulatory Visit | Attending: Plastic Surgery | Admitting: Plastic Surgery

## 2018-02-20 ENCOUNTER — Encounter (HOSPITAL_BASED_OUTPATIENT_CLINIC_OR_DEPARTMENT_OTHER): Payer: Self-pay | Admitting: *Deleted

## 2018-02-20 ENCOUNTER — Encounter (HOSPITAL_COMMUNITY)
Admission: RE | Admit: 2018-02-20 | Discharge: 2018-02-20 | Disposition: A | Discharge status: Home / Self Care | Payer: PPO | Source: Ambulatory Visit | Attending: Plastic Surgery | Admitting: Plastic Surgery

## 2018-02-20 DIAGNOSIS — Z01810 Encounter for preprocedural cardiovascular examination: Secondary | ICD-10-CM | POA: Diagnosis not present

## 2018-02-20 DIAGNOSIS — I10 Essential (primary) hypertension: Secondary | ICD-10-CM | POA: Diagnosis not present

## 2018-02-20 DIAGNOSIS — I4510 Unspecified right bundle-branch block: Secondary | ICD-10-CM | POA: Insufficient documentation

## 2018-02-20 NOTE — Progress Notes (Signed)
   02/20/18 1018  OBSTRUCTIVE SLEEP APNEA  Have you ever been diagnosed with sleep apnea through a sleep study? No (bought CPAP without being officially diagnosed with OSA, uses when wife says he snores too loud)  Do you snore loudly (loud enough to be heard through closed doors)?  1  Do you often feel tired, fatigued, or sleepy during the daytime (such as falling asleep during driving or talking to someone)? 0  Has anyone observed you stop breathing during your sleep? 0  Do you have, or are you being treated for high blood pressure? 1  BMI more than 35 kg/m2? 0  Age > 50 (1-yes) 1  Neck circumference greater than:Male 16 inches or larger, Male 17inches or larger? 0  Male Gender (Yes=1) 1  Obstructive Sleep Apnea Score 4

## 2018-02-20 NOTE — Progress Notes (Signed)
EKG reviewed by Dr. Turk, will proceed with surgery as scheduled. 

## 2018-02-21 ENCOUNTER — Other Ambulatory Visit: Payer: Self-pay | Admitting: Family Medicine

## 2018-02-22 NOTE — Telephone Encounter (Signed)
Electronic refill Flexeril Last refill 01/01/17 #30 Last office visit 10/30/17

## 2018-02-24 NOTE — Telephone Encounter (Signed)
Sent. Thanks.   

## 2018-02-24 NOTE — Anesthesia Preprocedure Evaluation (Addendum)
Anesthesia Evaluation  Patient identified by MRN, date of birth, ID band Patient awake    Reviewed: Allergy & Precautions, NPO status , Patient's Chart, lab work & pertinent test results  History of Anesthesia Complications Negative for: history of anesthetic complications  Airway Mallampati: I  TM Distance: >3 FB Neck ROM: Limited    Dental  (+) Teeth Intact   Pulmonary neg pulmonary ROS,    breath sounds clear to auscultation       Cardiovascular hypertension, negative cardio ROS   Rhythm:Regular     Neuro/Psych Low back pain with bilateral leg pain, no weakness    GI/Hepatic Neg liver ROS, GERD  Medicated and Controlled,  Endo/Other  negative endocrine ROS  Renal/GU Renal InsufficiencyRenal disease     Musculoskeletal  (+) Arthritis ,   Abdominal   Peds  Hematology negative hematology ROS (+)   Anesthesia Other Findings   Reproductive/Obstetrics                            Anesthesia Physical  Anesthesia Plan  ASA: II  Anesthesia Plan: General   Post-op Pain Management:    Induction: Intravenous  PONV Risk Score and Plan: 1 and Treatment may vary due to age or medical condition, Ondansetron and Dexamethasone  Airway Management Planned: LMA  Additional Equipment: None  Intra-op Plan:   Post-operative Plan: Extubation in OR  Informed Consent: I have reviewed the patients History and Physical, chart, labs and discussed the procedure including the risks, benefits and alternatives for the proposed anesthesia with the patient or authorized representative who has indicated his/her understanding and acceptance.   Dental advisory given  Plan Discussed with: CRNA, Surgeon and Anesthesiologist  Anesthesia Plan Comments: ( )       Anesthesia Quick Evaluation

## 2018-02-25 ENCOUNTER — Ambulatory Visit (HOSPITAL_BASED_OUTPATIENT_CLINIC_OR_DEPARTMENT_OTHER): Payer: PPO | Admitting: Anesthesiology

## 2018-02-25 ENCOUNTER — Encounter (HOSPITAL_BASED_OUTPATIENT_CLINIC_OR_DEPARTMENT_OTHER): Payer: Self-pay

## 2018-02-25 ENCOUNTER — Encounter (HOSPITAL_BASED_OUTPATIENT_CLINIC_OR_DEPARTMENT_OTHER)
Admission: RE | Disposition: A | Discharge status: Home / Self Care | Payer: Self-pay | Source: Ambulatory Visit | Attending: Plastic Surgery

## 2018-02-25 ENCOUNTER — Other Ambulatory Visit: Payer: Self-pay

## 2018-02-25 ENCOUNTER — Ambulatory Visit (HOSPITAL_COMMUNITY)
Admission: RE | Admit: 2018-02-25 | Discharge: 2018-02-25 | Disposition: A | Discharge status: Home / Self Care | Payer: PPO | Source: Ambulatory Visit | Attending: Plastic Surgery | Admitting: Plastic Surgery

## 2018-02-25 DIAGNOSIS — L97826 Non-pressure chronic ulcer of other part of left lower leg with bone involvement without evidence of necrosis: Secondary | ICD-10-CM | POA: Diagnosis not present

## 2018-02-25 DIAGNOSIS — Z79899 Other long term (current) drug therapy: Secondary | ICD-10-CM | POA: Diagnosis not present

## 2018-02-25 DIAGNOSIS — I10 Essential (primary) hypertension: Secondary | ICD-10-CM | POA: Diagnosis not present

## 2018-02-25 DIAGNOSIS — G4733 Obstructive sleep apnea (adult) (pediatric): Secondary | ICD-10-CM | POA: Insufficient documentation

## 2018-02-25 DIAGNOSIS — L97926 Non-pressure chronic ulcer of unspecified part of left lower leg with bone involvement without evidence of necrosis: Secondary | ICD-10-CM | POA: Diagnosis not present

## 2018-02-25 DIAGNOSIS — K219 Gastro-esophageal reflux disease without esophagitis: Secondary | ICD-10-CM | POA: Insufficient documentation

## 2018-02-25 DIAGNOSIS — N289 Disorder of kidney and ureter, unspecified: Secondary | ICD-10-CM | POA: Diagnosis not present

## 2018-02-25 DIAGNOSIS — L97824 Non-pressure chronic ulcer of other part of left lower leg with necrosis of bone: Secondary | ICD-10-CM | POA: Insufficient documentation

## 2018-02-25 DIAGNOSIS — M1990 Unspecified osteoarthritis, unspecified site: Secondary | ICD-10-CM | POA: Diagnosis not present

## 2018-02-25 DIAGNOSIS — M86662 Other chronic osteomyelitis, left tibia and fibula: Secondary | ICD-10-CM | POA: Insufficient documentation

## 2018-02-25 DIAGNOSIS — L97829 Non-pressure chronic ulcer of other part of left lower leg with unspecified severity: Secondary | ICD-10-CM | POA: Diagnosis not present

## 2018-02-25 HISTORY — PX: DEBRIDEMENT AND CLOSURE WOUND: SHX5614

## 2018-02-25 SURGERY — DEBRIDEMENT, WOUND, WITH CLOSURE
Anesthesia: Monitor Anesthesia Care | Site: Leg Lower | Laterality: Left

## 2018-02-25 MED ORDER — SUCCINYLCHOLINE CHLORIDE 200 MG/10ML IV SOSY
PREFILLED_SYRINGE | INTRAVENOUS | Status: AC
  Filled 2018-02-25: qty 10

## 2018-02-25 MED ORDER — BUPIVACAINE HCL 0.5 % IJ SOLN
INTRAMUSCULAR | Status: DC | PRN
  Administered 2018-02-25: 5 mL

## 2018-02-25 MED ORDER — ONDANSETRON HCL 4 MG/2ML IJ SOLN
INTRAMUSCULAR | Status: DC | PRN
  Administered 2018-02-25: 4 mg via INTRAVENOUS

## 2018-02-25 MED ORDER — CEFAZOLIN SODIUM-DEXTROSE 2-4 GM/100ML-% IV SOLN
2.0000 g | INTRAVENOUS | Status: AC
  Administered 2018-02-25: 2 g via INTRAVENOUS

## 2018-02-25 MED ORDER — MIDAZOLAM HCL 2 MG/2ML IJ SOLN
INTRAMUSCULAR | Status: AC
  Filled 2018-02-25: qty 2

## 2018-02-25 MED ORDER — CEFAZOLIN SODIUM-DEXTROSE 2-4 GM/100ML-% IV SOLN
INTRAVENOUS | Status: AC
  Filled 2018-02-25: qty 100

## 2018-02-25 MED ORDER — BACITRACIN ZINC 500 UNIT/GM EX OINT
TOPICAL_OINTMENT | CUTANEOUS | Status: AC
  Filled 2018-02-25: qty 28.35

## 2018-02-25 MED ORDER — MIDAZOLAM HCL 2 MG/2ML IJ SOLN: 1.0000 mg | INTRAMUSCULAR | Status: DC | PRN

## 2018-02-25 MED ORDER — SCOPOLAMINE 1 MG/3DAYS TD PT72: 1.0000 | MEDICATED_PATCH | Freq: Once | TRANSDERMAL | Status: DC | PRN

## 2018-02-25 MED ORDER — PROPOFOL 500 MG/50ML IV EMUL
INTRAVENOUS | Status: AC
  Filled 2018-02-25: qty 50

## 2018-02-25 MED ORDER — EPHEDRINE 5 MG/ML INJ
INTRAVENOUS | Status: AC
  Filled 2018-02-25: qty 10

## 2018-02-25 MED ORDER — LIDOCAINE-EPINEPHRINE 1 %-1:100000 IJ SOLN
INTRAMUSCULAR | Status: AC
  Filled 2018-02-25: qty 1

## 2018-02-25 MED ORDER — TRAMADOL HCL 50 MG PO TABS: 50.0000 mg | ORAL_TABLET | Freq: Four times a day (QID) | ORAL | 0 refills | Status: AC | PRN

## 2018-02-25 MED ORDER — FENTANYL CITRATE (PF) 100 MCG/2ML IJ SOLN
50.0000 ug | INTRAMUSCULAR | Status: DC | PRN
  Administered 2018-02-25 (×2): 50 ug via INTRAVENOUS

## 2018-02-25 MED ORDER — LACTATED RINGERS IV SOLN
INTRAVENOUS | Status: DC
  Administered 2018-02-25 (×2): via INTRAVENOUS

## 2018-02-25 MED ORDER — FENTANYL CITRATE (PF) 100 MCG/2ML IJ SOLN: 25.0000 ug | INTRAMUSCULAR | Status: DC | PRN

## 2018-02-25 MED ORDER — ONDANSETRON HCL 4 MG/2ML IJ SOLN
INTRAMUSCULAR | Status: AC
  Filled 2018-02-25: qty 2

## 2018-02-25 MED ORDER — MEPERIDINE HCL 25 MG/ML IJ SOLN: 6.2500 mg | INTRAMUSCULAR | Status: DC | PRN

## 2018-02-25 MED ORDER — FENTANYL CITRATE (PF) 100 MCG/2ML IJ SOLN
INTRAMUSCULAR | Status: AC
  Filled 2018-02-25: qty 2

## 2018-02-25 MED ORDER — DEXAMETHASONE SODIUM PHOSPHATE 10 MG/ML IJ SOLN
INTRAMUSCULAR | Status: AC
  Filled 2018-02-25: qty 1

## 2018-02-25 MED ORDER — PHENYLEPHRINE 40 MCG/ML (10ML) SYRINGE FOR IV PUSH (FOR BLOOD PRESSURE SUPPORT)
PREFILLED_SYRINGE | INTRAVENOUS | Status: AC
  Filled 2018-02-25: qty 10

## 2018-02-25 MED ORDER — LIDOCAINE 2% (20 MG/ML) 5 ML SYRINGE
INTRAMUSCULAR | Status: AC
  Filled 2018-02-25: qty 5

## 2018-02-25 MED ORDER — DEXAMETHASONE SODIUM PHOSPHATE 4 MG/ML IJ SOLN
INTRAMUSCULAR | Status: DC | PRN
  Administered 2018-02-25: 8 mg via INTRAVENOUS

## 2018-02-25 MED ORDER — BUPIVACAINE-EPINEPHRINE (PF) 0.25% -1:200000 IJ SOLN
INTRAMUSCULAR | Status: AC
  Filled 2018-02-25: qty 30

## 2018-02-25 SURGICAL SUPPLY — 53 items
BENZOIN TINCTURE PRP APPL 2/3 (GAUZE/BANDAGES/DRESSINGS) IMPLANT
BLADE CLIPPER SURG (BLADE) IMPLANT
BLADE SURG 10 STRL SS (BLADE) IMPLANT
BLADE SURG 15 STRL LF DISP TIS (BLADE) ×1 IMPLANT
BLADE SURG 15 STRL SS (BLADE) ×1
BRUSH SCRUB EZ PLAIN DRY (MISCELLANEOUS) ×4 IMPLANT
BUR OVAL CARBIDE 4.0 (BURR) ×2 IMPLANT
CANISTER SUCT 1200ML W/VALVE (MISCELLANEOUS) IMPLANT
COVER BACK TABLE 60X90IN (DRAPES) ×2 IMPLANT
COVER MAYO STAND STRL (DRAPES) ×2 IMPLANT
COVER WAND RF STERILE (DRAPES) IMPLANT
DERMABOND ADVANCED (GAUZE/BANDAGES/DRESSINGS)
DERMABOND ADVANCED .7 DNX12 (GAUZE/BANDAGES/DRESSINGS) IMPLANT
DRAPE LAPAROSCOPIC ABDOMINAL (DRAPES) IMPLANT
DRAPE U-SHAPE 76X120 STRL (DRAPES) ×2 IMPLANT
DRSG EMULSION OIL 3X3 NADH (GAUZE/BANDAGES/DRESSINGS) ×2 IMPLANT
ELECT COATED BLADE 2.86 ST (ELECTRODE) IMPLANT
ELECT REM PT RETURN 9FT ADLT (ELECTROSURGICAL) ×2
ELECT REM PT RETURN 9FT PED (ELECTROSURGICAL)
ELECTRODE REM PT RETRN 9FT PED (ELECTROSURGICAL) IMPLANT
ELECTRODE REM PT RTRN 9FT ADLT (ELECTROSURGICAL) ×1 IMPLANT
GAUZE SPONGE 4X4 12PLY STRL LF (GAUZE/BANDAGES/DRESSINGS) IMPLANT
GLOVE BIO SURGEON STRL SZ 6 (GLOVE) ×4 IMPLANT
GLOVE BIOGEL PI IND STRL 7.0 (GLOVE) ×1 IMPLANT
GLOVE BIOGEL PI INDICATOR 7.0 (GLOVE) ×1
GLOVE ECLIPSE 6.5 STRL STRAW (GLOVE) ×2 IMPLANT
GOWN STRL REUS W/ TWL LRG LVL3 (GOWN DISPOSABLE) ×2 IMPLANT
GOWN STRL REUS W/TWL LRG LVL3 (GOWN DISPOSABLE) ×2
MATRIX WOUND MESHED 2X2 (Tissue) ×1 IMPLANT
NEEDLE PRECISIONGLIDE 27X1.5 (NEEDLE) ×2 IMPLANT
NS IRRIG 1000ML POUR BTL (IV SOLUTION) ×2 IMPLANT
PACK BASIN DAY SURGERY FS (CUSTOM PROCEDURE TRAY) ×2 IMPLANT
PENCIL BUTTON HOLSTER BLD 10FT (ELECTRODE) ×2 IMPLANT
SHEET MEDIUM DRAPE 40X70 STRL (DRAPES) ×2 IMPLANT
SPONGE LAP 18X18 RF (DISPOSABLE) IMPLANT
STAPLER VISISTAT 35W (STAPLE) ×2 IMPLANT
STRIP CLOSURE SKIN 1/2X4 (GAUZE/BANDAGES/DRESSINGS) IMPLANT
SUCTION FRAZIER HANDLE 10FR (MISCELLANEOUS) ×1
SUCTION TUBE FRAZIER 10FR DISP (MISCELLANEOUS) ×1 IMPLANT
SUT CHROMIC 4 0 PS 2 18 (SUTURE) ×2 IMPLANT
SUT ETHILON 4 0 PS 2 18 (SUTURE) IMPLANT
SUT ETHILON 5 0 P 3 18 (SUTURE)
SUT MNCRL AB 4-0 PS2 18 (SUTURE) IMPLANT
SUT NYLON ETHILON 5-0 P-3 1X18 (SUTURE) IMPLANT
SUT PLAIN 5 0 P 3 18 (SUTURE) IMPLANT
SUT VICRYL 4-0 PS2 18IN ABS (SUTURE) IMPLANT
SYR BULB IRRIGATION 50ML (SYRINGE) ×2 IMPLANT
SYR CONTROL 10ML LL (SYRINGE) ×2 IMPLANT
TOWEL GREEN STERILE FF (TOWEL DISPOSABLE) ×2 IMPLANT
TRAY DSU PREP LF (CUSTOM PROCEDURE TRAY) ×2 IMPLANT
TUBE CONNECTING 20X1/4 (TUBING) ×2 IMPLANT
WOUND MATRIX MESHED 2X2 (Tissue) ×1 IMPLANT
YANKAUER SUCT BULB TIP NO VENT (SUCTIONS) ×2 IMPLANT

## 2018-02-25 NOTE — Anesthesia Procedure Notes (Signed)
Procedure Name: LMA Insertion Date/Time: 02/25/2018 7:32 AM Performed by: Willa Frater, CRNA Pre-anesthesia Checklist: Patient identified, Emergency Drugs available, Suction available and Patient being monitored Patient Re-evaluated:Patient Re-evaluated prior to induction Oxygen Delivery Method: Circle system utilized Preoxygenation: Pre-oxygenation with 100% oxygen Induction Type: IV induction Ventilation: Mask ventilation without difficulty LMA: LMA inserted LMA Size: 5.0 Number of attempts: 1 Airway Equipment and Method: Bite block Placement Confirmation: positive ETCO2 Tube secured with: Tape Dental Injury: Teeth and Oropharynx as per pre-operative assessment

## 2018-02-25 NOTE — Op Note (Signed)
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 Surgical preparation for grafting left leg 1 cm2 2. Application Integra dermal substrate 1.5 cm2  SURGEON: Irene Limbo MD MBA  ASSISTANT: none  ANESTHESIA:  General.   EBL: minimal  COMPLICATIONS: None immediate.   INDICATIONS FOR PROCEDURE:  The patient, [name], is a 72 y.o. male born on 08-02-1945, is here for debridement chronic wound left leg of over year duration.   FINDINGS: Sequestrum tibia removed. Following debridement open wound 1.8 x 0.8 cm noted to bone.  DESCRIPTION OF PROCEDURE:  The patient's operative site was marked with the patient in the preoperative area. The patient was taken to the operating room. SCDs were placed and IV antibiotics were given. The patient's operative site was prepped and draped in a sterile fashion. A time out was performed and all information was confirmed to be correct.  Local anesthetic infiltrated surrounding wound. Sharp excision skin margins completed. Burr used to tangentially debride exposed bone. Exposed bone was noted during this to be unstable and rongeur used to remove the remainder dry necrotic bone, total area 1 cm2. The bone following debridement was noted to be hard in nature with punctate bleeding. Integra dermal substrate inset over bone with 4-0 chromic, 1.5 cm2. Adaptic and sterile sponge applied as bolster.  The patient was allowed to wake from anesthesia, extubated and taken to the recovery room in satisfactory condition.   SPECIMENS: left tibia for pathology and culture  DRAINS: none  Irene Limbo, MD [facility] Re[facility] Surgery [phone], pin [phone]

## 2018-02-25 NOTE — Interval H&P Note (Signed)
History and Physical Interval Note:  02/25/2018 6:59 AM  [name]  has presented today for surgery, with the diagnosis of non pressure ulcer left leg  The various methods of treatment have been discussed with the patient and family. After consideration of risks, benefits and other options for treatment, the patient has consented to  Procedure(s): DEBRIDEMENT LEFT LEG APPLICATION INTEGRA (Left) as a surgical intervention .  The patient's history has been reviewed, patient examined, no change in status, stable for surgery.  I have reviewed the patient's chart and labs.  Questions were answered to the patient's satisfaction.     Arnoldo Hooker Amiya Escamilla

## 2018-02-25 NOTE — Anesthesia Postprocedure Evaluation (Signed)
Anesthesia Post Note  Patient: [name]  Procedure(s) Performed: DEBRIDEMENT LEFT LEG APPLICATION INTEGRA (Left Leg Lower)     Patient location during evaluation: PACU Anesthesia Type: MAC Level of consciousness: awake and alert and oriented Pain management: pain level controlled Vital Signs Assessment: post-procedure vital signs reviewed and stable Respiratory status: spontaneous breathing, nonlabored ventilation and respiratory function stable Cardiovascular status: stable and blood pressure returned to baseline Postop Assessment: no apparent nausea or vomiting Anesthetic complications: no    Last Vitals:  Vitals:   02/25/18 0845 02/25/18 0910  BP: 109/63 127/82  Pulse: 82 79  Resp: 14 14  Temp:  36.4 C  SpO2: 97% 97%    Last Pain:  Vitals:   02/25/18 0910  TempSrc: Oral  PainSc: 0-No pain                 Sumayyah Custodio A.

## 2018-02-25 NOTE — Discharge Instructions (Signed)

## 2018-02-25 NOTE — Transfer of Care (Signed)
Immediate Anesthesia Transfer of Care Note  Patient: [name]  Procedure(s) Performed: DEBRIDEMENT LEFT LEG APPLICATION INTEGRA (Left Leg Lower)  Patient Location: PACU  Anesthesia Type:General  Level of Consciousness: sedated  Airway & Oxygen Therapy: Patient Spontanous Breathing and Patient connected to face mask oxygen  Post-op Assessment: Report given to RN and Post -op Vital signs reviewed and stable  Post vital signs: Reviewed and stable  Last Vitals:  Vitals Value Taken Time  BP 105/73 02/25/2018  8:10 AM  Temp    Pulse 81 02/25/2018  8:13 AM  Resp 10 02/25/2018  8:13 AM  SpO2 98 % 02/25/2018  8:13 AM  Vitals shown include unvalidated device data.  Last Pain:  Vitals:   02/25/18 0645  TempSrc: Oral  PainSc: 0-No pain         Complications: No apparent anesthesia complications

## 2018-02-26 ENCOUNTER — Encounter (HOSPITAL_BASED_OUTPATIENT_CLINIC_OR_DEPARTMENT_OTHER): Payer: Self-pay | Admitting: Plastic Surgery

## 2018-02-27 ENCOUNTER — Encounter (HOSPITAL_BASED_OUTPATIENT_CLINIC_OR_DEPARTMENT_OTHER): Payer: Self-pay | Admitting: Plastic Surgery

## 2018-03-04 DIAGNOSIS — L97924 Non-pressure chronic ulcer of unspecified part of left lower leg with necrosis of bone: Secondary | ICD-10-CM | POA: Diagnosis not present

## 2018-03-06 LAB — AEROBIC/ANAEROBIC CULTURE W GRAM STAIN (SURGICAL/DEEP WOUND)

## 2018-03-06 LAB — AEROBIC/ANAEROBIC CULTURE (SURGICAL/DEEP WOUND)

## 2018-03-20 DIAGNOSIS — M79642 Pain in left hand: Secondary | ICD-10-CM | POA: Diagnosis not present

## 2018-03-26 DIAGNOSIS — Z6828 Body mass index (BMI) 28.0-28.9, adult: Secondary | ICD-10-CM | POA: Diagnosis not present

## 2018-03-26 DIAGNOSIS — I10 Essential (primary) hypertension: Secondary | ICD-10-CM | POA: Diagnosis not present

## 2018-03-26 DIAGNOSIS — M545 Low back pain: Secondary | ICD-10-CM | POA: Diagnosis not present

## 2018-04-04 DIAGNOSIS — L97924 Non-pressure chronic ulcer of unspecified part of left lower leg with necrosis of bone: Secondary | ICD-10-CM | POA: Diagnosis not present

## 2018-04-19 ENCOUNTER — Ambulatory Visit: Payer: PPO

## 2018-04-21 DIAGNOSIS — R05 Cough: Secondary | ICD-10-CM | POA: Diagnosis not present

## 2018-04-22 DIAGNOSIS — L97926 Non-pressure chronic ulcer of unspecified part of left lower leg with bone involvement without evidence of necrosis: Secondary | ICD-10-CM | POA: Diagnosis not present

## 2018-04-23 ENCOUNTER — Encounter: Payer: Self-pay | Admitting: Family Medicine

## 2018-04-23 ENCOUNTER — Ambulatory Visit (INDEPENDENT_AMBULATORY_CARE_PROVIDER_SITE_OTHER): Payer: PPO | Admitting: Family Medicine

## 2018-04-23 DIAGNOSIS — R05 Cough: Secondary | ICD-10-CM

## 2018-04-23 DIAGNOSIS — R059 Cough, unspecified: Secondary | ICD-10-CM

## 2018-04-23 MED ORDER — BENZONATATE 200 MG PO CAPS: 200.0000 mg | ORAL_CAPSULE | Freq: Three times a day (TID) | ORAL | 1 refills | Status: DC | PRN

## 2018-04-23 NOTE — Progress Notes (Signed)
Sx started about 1 month ago with a head cold.  Then had ST, then chest congestion.  Started using mucinex and chest sx got better.  In the AM and late PM with throat clearing cough.  Now with dry cough.  He doesn't feel sick except for nausea this AM.  Diarrhea for about 3 weeks, but yesterday and today stools are getting more normal.    No sputum.  Some post nasal gtt.  No fevers.  No blood in stool.  No vomiting.  No mucous in stool.  No recent abx use.    Advised patient against concurrent meloxicam and ibuprofen use. He was only using ibuprofen prn.      Advised against routine vit E and zinc use and evidence for benefit is limited.   He had L shin resutured yesterday per outside clinic.  I'll defer.    Meds, vitals, and allergies reviewed.   ROS: Per HPI unless specifically indicated in ROS section   GEN: nad, alert and oriented HEENT: mucous membranes moist, tm w/o erythema, nasal exam w/o erythema, clear discharge noted,  OP with minimal cobblestoning NECK: supple w/o LA CV: rrr.   PULM: ctab, no inc wob EXT: no edema SKIN: L shin sutured, clean appearing edges.

## 2018-04-23 NOTE — Patient Instructions (Signed)
Likely a post infectious cough with diarrhea related to post nasal drip.  Tessalon for cough.  Imodium 2mg  twice a day as needed for diarrhea.  If needed, start using flonase 2 sprays per nostril per day.   Cough should gradually get better.   Update me as needed.

## 2018-04-24 DIAGNOSIS — R051 Acute cough: Secondary | ICD-10-CM | POA: Insufficient documentation

## 2018-04-24 DIAGNOSIS — R059 Cough, unspecified: Secondary | ICD-10-CM | POA: Insufficient documentation

## 2018-04-24 DIAGNOSIS — R05 Cough: Secondary | ICD-10-CM | POA: Insufficient documentation

## 2018-04-24 NOTE — Assessment & Plan Note (Signed)
Well-appearing.  Discussed options. Likely a post infectious cough with diarrhea related to post nasal drip.  Tessalon for cough.  Imodium 2mg  twice a day as needed for diarrhea.  If needed, start using flonase 2 sprays per nostril per day.   Cough should gradually get better.   Update me as needed.   I will defer to the outside clinic about his leg lesion.  He agrees.

## 2018-05-06 DIAGNOSIS — M48061 Spinal stenosis, lumbar region without neurogenic claudication: Secondary | ICD-10-CM | POA: Diagnosis not present

## 2018-05-08 DIAGNOSIS — L97924 Non-pressure chronic ulcer of unspecified part of left lower leg with necrosis of bone: Secondary | ICD-10-CM | POA: Diagnosis not present

## 2018-08-01 DIAGNOSIS — L820 Inflamed seborrheic keratosis: Secondary | ICD-10-CM | POA: Diagnosis not present

## 2018-08-01 DIAGNOSIS — L821 Other seborrheic keratosis: Secondary | ICD-10-CM | POA: Diagnosis not present

## 2018-08-01 DIAGNOSIS — L97924 Non-pressure chronic ulcer of unspecified part of left lower leg with necrosis of bone: Secondary | ICD-10-CM | POA: Diagnosis not present

## 2018-08-01 DIAGNOSIS — S92354A Nondisplaced fracture of fifth metatarsal bone, right foot, initial encounter for closed fracture: Secondary | ICD-10-CM | POA: Diagnosis not present

## 2018-08-01 DIAGNOSIS — M5412 Radiculopathy, cervical region: Secondary | ICD-10-CM | POA: Diagnosis not present

## 2018-08-01 DIAGNOSIS — D225 Melanocytic nevi of trunk: Secondary | ICD-10-CM | POA: Diagnosis not present

## 2018-08-01 DIAGNOSIS — L570 Actinic keratosis: Secondary | ICD-10-CM | POA: Diagnosis not present

## 2018-08-23 ENCOUNTER — Other Ambulatory Visit: Payer: Self-pay | Admitting: Anesthesiology

## 2018-08-23 DIAGNOSIS — M5412 Radiculopathy, cervical region: Secondary | ICD-10-CM

## 2018-09-05 ENCOUNTER — Ambulatory Visit
Admission: RE | Admit: 2018-09-05 | Discharge: 2018-09-05 | Disposition: A | Discharge status: Home / Self Care | Payer: PPO | Source: Ambulatory Visit | Attending: Anesthesiology | Admitting: Anesthesiology

## 2018-09-05 ENCOUNTER — Other Ambulatory Visit: Payer: Self-pay

## 2018-09-05 DIAGNOSIS — M4802 Spinal stenosis, cervical region: Secondary | ICD-10-CM | POA: Diagnosis not present

## 2018-09-05 DIAGNOSIS — M5412 Radiculopathy, cervical region: Secondary | ICD-10-CM

## 2018-09-24 DIAGNOSIS — Z6827 Body mass index (BMI) 27.0-27.9, adult: Secondary | ICD-10-CM | POA: Diagnosis not present

## 2018-09-24 DIAGNOSIS — M4712 Other spondylosis with myelopathy, cervical region: Secondary | ICD-10-CM | POA: Diagnosis not present

## 2018-09-24 DIAGNOSIS — R030 Elevated blood-pressure reading, without diagnosis of hypertension: Secondary | ICD-10-CM | POA: Diagnosis not present

## 2018-10-08 DIAGNOSIS — Z1159 Encounter for screening for other viral diseases: Secondary | ICD-10-CM | POA: Diagnosis not present

## 2018-10-14 DIAGNOSIS — M4712 Other spondylosis with myelopathy, cervical region: Secondary | ICD-10-CM | POA: Diagnosis not present

## 2018-10-14 DIAGNOSIS — M4722 Other spondylosis with radiculopathy, cervical region: Secondary | ICD-10-CM | POA: Diagnosis not present

## 2018-10-14 DIAGNOSIS — Z981 Arthrodesis status: Secondary | ICD-10-CM | POA: Diagnosis not present

## 2018-10-21 DIAGNOSIS — M5431 Sciatica, right side: Secondary | ICD-10-CM | POA: Diagnosis not present

## 2018-10-21 DIAGNOSIS — M1611 Unilateral primary osteoarthritis, right hip: Secondary | ICD-10-CM | POA: Diagnosis not present

## 2018-11-04 DIAGNOSIS — I10 Essential (primary) hypertension: Secondary | ICD-10-CM | POA: Diagnosis not present

## 2018-11-04 DIAGNOSIS — M48061 Spinal stenosis, lumbar region without neurogenic claudication: Secondary | ICD-10-CM | POA: Diagnosis not present

## 2018-11-04 DIAGNOSIS — Z981 Arthrodesis status: Secondary | ICD-10-CM | POA: Diagnosis not present

## 2018-11-04 DIAGNOSIS — Z6827 Body mass index (BMI) 27.0-27.9, adult: Secondary | ICD-10-CM | POA: Diagnosis not present

## 2018-11-05 DIAGNOSIS — M4712 Other spondylosis with myelopathy, cervical region: Secondary | ICD-10-CM | POA: Diagnosis not present

## 2018-11-11 DIAGNOSIS — M5431 Sciatica, right side: Secondary | ICD-10-CM | POA: Diagnosis not present

## 2018-11-15 ENCOUNTER — Other Ambulatory Visit: Payer: Self-pay

## 2018-11-25 DIAGNOSIS — M48061 Spinal stenosis, lumbar region without neurogenic claudication: Secondary | ICD-10-CM | POA: Diagnosis not present

## 2018-12-09 DIAGNOSIS — R251 Tremor, unspecified: Secondary | ICD-10-CM | POA: Insufficient documentation

## 2018-12-11 ENCOUNTER — Encounter: Payer: Self-pay | Admitting: Neurology

## 2019-01-10 NOTE — Progress Notes (Addendum)
[name] was seen today in the movement disorders clinic for neurologic consultation at the request of Newman Pies, MD.  The consultation is for the evaluation of RUE tremor and to r/o PD.  The records that were made available to me were reviewed.  Wife present on facetime and supplements the hx.    Specific Symptoms:  Tremor: Yes.  , started around the same time he had neck pain and he was told that he thought that he had a pinched nerve causing the shaking.  His pain did get better following surgery but the tremor did not and it has gotten worse.  Tremor on the R hand is a rest tremor.  He can hold it in the air and it goes away.  No R leg tremor.  Notes RUE tremor with ambulation. Family hx of similar:  No. (father may have had leg tremor in the "late years." Voice: no change Sleep: sleeps well  Vivid Dreams:  No.  Acting out dreams:  No. Wet Pillows: No., uses CPAP Postural symptoms:  No., not unless I have "vertigo"  Falls?  No. Bradykinesia symptoms: no bradykinesia noted Loss of smell:  Yes.   x 20 year Loss of taste:  No. Urinary Incontinence:  No., but has urgency Difficulty Swallowing:  No. Handwriting, micrographia: No. Trouble with ADL's:  No.  Trouble buttoning clothing: No. Depression:  No. Memory changes:  No. Hallucinations:  No.  visual distortions: No. N/V:  No. Lightheaded:  No.  Syncope: No. Diplopia:  No. Dyskinesia:  No. Prior exposure to reglan/antipsychotics: No.  Neuroimaging of the brain has not previously been performed.    PREVIOUS MEDICATIONS: none to date  ALLERGIES:   Allergies  Allergen Reactions  . Flomax [Tamsulosin Hcl] Other (See Comments)    Lack of effect, not allergy or intolerance.   . Morphine And Related Hives    Hallucinations- can tolerate oxycodone.     CURRENT MEDICATIONS:  Current Outpatient Medications  Medication Instructions  . benzonatate (TESSALON) 200 mg, Oral, 3 times daily PRN  .  cyclobenzaprine (FLEXERIL) 5-10 mg, Oral, 3 times daily PRN  . diphenhydrAMINE (BENADRYL) 25 mg, Oral, At bedtime PRN  . Docosanol (ABREVA) 10 % CREA 1 application, Topical, 4 times daily PRN  . docusate sodium (COLACE) 100 mg, Oral, 2 times daily PRN  . ECHIN-GLDNSEAL-GNSNG-RSHP-ZN-C PO Oral  . ibuprofen (ADVIL) 400-600 mg, Oral, Every 8 hours PRN  . Multiple Vitamin (MULTIVITAMIN WITH MINERALS) TABS tablet 1 tablet, Oral, Daily, Multivitamin for Men 50+  . Omega-3 Fatty Acids (OMEGA 3 PO) 1 capsule, Oral, Daily  . omeprazole (PRILOSEC) 20 mg, Oral, Daily PRN  . traMADol (ULTRAM) 50 mg, Oral, Every 6 hours PRN    PAST MEDICAL HISTORY:   Past Medical History:  Diagnosis Date  . Arthritis   . GERD (gastroesophageal reflux disease)   . H/O urinary frequency   . Heart murmur    as a child  . History of bronchitis   . HSV infection    oral  . Hypertension    improved after back pain treated with surgery  . Insomnia   . Joint pain   . OSA on CPAP    he ordered kit and started use 2017 w/o formal testing, see note from 11/30/15.   . Vertigo 10/13/2014   recurrent; trigger is cool air on left ear, improved with daily antihistamine    PAST SURGICAL HISTORY:   Past Surgical History:  Procedure Laterality Date  .  ACHILLES TENDON REPAIR Left   . BACK SURGERY    . COLONOSCOPY  02/2011   Negative,[facility]  . DEBRIDEMENT AND CLOSURE WOUND Left 02/25/2018   Procedure: DEBRIDEMENT LEFT LEG APPLICATION INTEGRA;  Surgeon: Irene Limbo, MD;  Location: [facility];  Service: Plastics;  Laterality: Left;  . EYE SURGERY Bilateral    cataracts  . KNEE ARTHROSCOPY     L  . LUMBAR FUSION     2016  . SPINE SURGERY  2003   cervical fusion  . TONSILLECTOMY    . varicoelectomy      SOCIAL HISTORY:   Social History   Socioeconomic History  . Marital status: Married    Spouse name: Not on file  . Number of children: 2  . Years of education: 27  . Highest  education level: Not on file  Occupational History  . Occupation: retired  Scientific laboratory technician  . Financial resource strain: Not on file  . Food insecurity    Worry: Not on file    Inability: Not on file  . Transportation needs    Medical: Not on file    Non-medical: Not on file  Tobacco Use  . Smoking status: Never Smoker  . Smokeless tobacco: Never Used  Substance and Sexual Activity  . Alcohol use: No  . Drug use: No  . Sexual activity: Not on file  Lifestyle  . Physical activity    Days per week: Not on file    Minutes per session: Not on file  . Stress: Not on file  Relationships  . Social Herbalist on phone: Not on file    Gets together: Not on file    Attends religious service: Not on file    Active member of club or organization: Not on file    Attends meetings of clubs or organizations: Not on file    Relationship status: Not on file  . Intimate partner violence    Fear of current or ex partner: Not on file    Emotionally abused: Not on file    Physically abused: Not on file    Forced sexual activity: Not on file  Other Topics Concern  . 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      FAMILY HISTORY:   Family Status  Relation Name Status  . Mother  Deceased  . Father  Deceased  . PGF  (Not Specified)  . Sister  Alive  . Brother  Alive  . Child 2 Alive  . Neg Hx  (Not Specified)    ROS:  Review of Systems  Constitutional: Negative.   HENT: Negative.   Eyes: Negative.   Respiratory: Negative.   Cardiovascular: Negative.   Gastrointestinal: Negative.   Genitourinary: Positive for urgency.  Musculoskeletal: Negative.   Skin: Negative.   Endo/Heme/Allergies: Negative.     PHYSICAL EXAMINATION:    VITALS:    Vitals:   01/14/19 0825  BP: 140/70  Pulse: 74  SpO2: 97%  Weight: 214 lb (97.1 kg)  Height: _0  (1.93 m)    GEN:  The patient appears stated age and is in NAD. HEENT:  Normocephalic, atraumatic.  The mucous membranes are moist. The superficial  temporal arteries are without ropiness or tenderness. CV:  RRR Lungs:  CTAB Neck/HEME:  There are no carotid bruits bilaterally.  Neurological examination:  Orientation: The patient is alert and oriented x3. Fund of knowledge is appropriate.  Recent and remote memory are intact.  Attention and concentration are normal.    Able to name objects and repeat phrases. Cranial nerves: There is good facial symmetry. There is facial hypomimia.   Extraocular muscles are intact. The visual fields are full to confrontational testing. The speech is fluent and clear. Soft palate rises symmetrically and there is no tongue deviation. Hearing is intact to conversational tone. Sensation: Sensation is intact to light and pinprick throughout (facial, trunk, extremities). Vibration is intact at the bilateral big toe. There is no extinction with double simultaneous stimulation. There is no sensory dermatomal level identified. Motor: Strength is 5/5 in the bilateral upper and lower extremities.   Shoulder shrug is equal and symmetric.  There is no pronator drift. Deep tendon reflexes: Deep tendon reflexes are 2/4 at the bilateral biceps, triceps, brachioradialis, 1/4 at the bilateral patella and achilles. Plantar responses are downgoing bilaterally.  Movement examination: Tone: There is mild increased tone in the RUE/RLE Abnormal movements: there is mild RUE rest tremor that becomes mod to severe with distraction Coordination:  There is mild decremation with RAM's, with alternating supination and pronation of the forearm, hand opening and closing, finger taps on the R.  All other RAM's are normal bilaterally Gait and Station: The patient has no difficulty arising out  of a deep-seated chair without the use of the hands. The patient's stride length is good.  He is stooped at the waist.  He has increased rest tremor on the R with ambulation.  Lab Results  Component Value Date   TSH 4.02 06/21/2015     Chemistry      Component Value Date/Time   NA 137 04/11/2017 0936   K 4.5 04/11/2017 0936   CL 101 04/11/2017 0936   CO2 27 04/11/2017 0936   BUN 18 04/11/2017 0936   CREATININE 1.39 04/11/2017 0936      Component Value Date/Time   CALCIUM 9.1 04/11/2017 0936   ALKPHOS 66 04/11/2017 0936   AST 18 04/11/2017 0936   ALT 24 04/11/2017 0936   BILITOT 0.9 04/11/2017 0936       ASSESSMENT/PLAN:  1.  Idiopathic Parkinson's disease.  The patient has tremor, bradykinesia, rigidity and mild postural instability.  Dx: 01/14/19  -We discussed the diagnosis as well as pathophysiology of the disease.  We discussed treatment options as well as prognostic indicators.  Patient education was provided.  -We discussed that it used to be thought that levodopa would increase risk of melanoma but now it is believed that Parkinsons itself likely increases risk of melanoma. he is to get regular skin checks.  -Greater than 50% of the 60 minute visit was spent in counseling answering questions and talking about what to expect now as well as in the future.  We talked about medication options as well as potential future surgical options.  We talked about safety in the home.  -We decided to add carbidopa/levodopa 25/100.  1/2 tab tid x 1 wk, then 1/2 in am & noon & 1 at night for a week, then 1/2 in am &1 at noon &night for a week, then 1 po tid.  Risks, benefits, side effects and alternative therapies were discussed.  The opportunity to ask questions was given and they were answered  to the best of my ability.  The patient expressed understanding and willingness to follow the outlined treatment protocols.  -We talked about the importance of safe, cardiovascular exercise in  Parkinson's disease.  -We discussed community resources in the area including patient support groups and community exercise programs for PD and pt education was provided to the patient.  -met with my social worker today  -chem, tsh today  2.  Follow up is anticipated in the next 4-6 months, sooner should new neurologic issues arise.  Much greater than 50% of this visit was spent in counseling and coordinating care.  Total face to face time:  60 min  Cc:  Tonia Ghent, MD

## 2019-01-14 ENCOUNTER — Ambulatory Visit (INDEPENDENT_AMBULATORY_CARE_PROVIDER_SITE_OTHER): Payer: PPO | Admitting: Clinical

## 2019-01-14 ENCOUNTER — Encounter: Payer: Self-pay | Admitting: Neurology

## 2019-01-14 ENCOUNTER — Other Ambulatory Visit (INDEPENDENT_AMBULATORY_CARE_PROVIDER_SITE_OTHER): Payer: PPO

## 2019-01-14 ENCOUNTER — Ambulatory Visit (INDEPENDENT_AMBULATORY_CARE_PROVIDER_SITE_OTHER): Payer: PPO | Admitting: Neurology

## 2019-01-14 ENCOUNTER — Telehealth: Payer: Self-pay

## 2019-01-14 ENCOUNTER — Other Ambulatory Visit: Payer: Self-pay

## 2019-01-14 VITALS — BP 140/70 | HR 74 | Ht 76.0 in | Wt 214.0 lb

## 2019-01-14 DIAGNOSIS — Z5181 Encounter for therapeutic drug level monitoring: Secondary | ICD-10-CM

## 2019-01-14 DIAGNOSIS — R251 Tremor, unspecified: Secondary | ICD-10-CM | POA: Diagnosis not present

## 2019-01-14 DIAGNOSIS — G20A1 Parkinson's disease without dyskinesia, without mention of fluctuations: Secondary | ICD-10-CM

## 2019-01-14 DIAGNOSIS — G20 Parkinson's disease: Secondary | ICD-10-CM

## 2019-01-14 DIAGNOSIS — Z719 Counseling, unspecified: Secondary | ICD-10-CM

## 2019-01-14 LAB — TSH: TSH: 4.39 u[IU]/mL (ref 0.35–4.50)

## 2019-01-14 MED ORDER — CARBIDOPA-LEVODOPA 25-100 MG PO TABS: 1.0000 | ORAL_TABLET | Freq: Three times a day (TID) | ORAL | 1 refills | Status: DC

## 2019-01-14 NOTE — Telephone Encounter (Signed)
Called patient no answer left message lab normal on voice mail

## 2019-01-14 NOTE — Patient Instructions (Addendum)
Start Carbidopa Levodopa as follows:  Take 1/2 tablet three times daily, at least 30 minutes before meals, for one week  Then take 1/2 tablet in the morning, 1/2 tablet in the afternoon, 1 tablet in the evening, at least 30 minutes before meals, for one week  Then take 1/2 tablet in the morning, 1 tablet in the afternoon, 1 tablet in the evening, at least 30 minutes before meals, for one week  Then take 1 tablet three times daily at 7am/11am/4pm   As a reminder, carbidopa/levodopa can be taken at the same time as a carbohydrate, but we like to have you take your pill either 30 minutes before a protein source or 1 hour after as protein can interfere with carbidopa/levodopa absorption.  The physicians and staff at [facility] are committed to providing excellent care. You may receive a survey requesting feedback about your experience at our office. We strive to receive "very good" responses to the survey questions. If you feel that your experience would prevent you from giving the office a "very good " response, please contact our office to try to remedy the situation. We may be reached at [phone]. Thank you for taking the time out of your busy day to complete the survey.

## 2019-01-14 NOTE — Telephone Encounter (Signed)
-----   Message from [place], DO sent at 01/14/2019  4:03 PM EDT ----- Let pt know that TSH was normal

## 2019-01-15 LAB — COMPLETE METABOLIC PANEL WITH GFR
AG Ratio: 1.7 (calc) (ref 1.0–2.5)
ALT: 20 U/L (ref 9–46)
AST: 17 U/L (ref 10–35)
Albumin: 4.5 g/dL (ref 3.6–5.1)
Alkaline phosphatase (APISO): 63 U/L (ref 35–144)
BUN/Creatinine Ratio: 10 (calc) (ref 6–22)
BUN: 13 mg/dL (ref 7–25)
CO2: 23 mmol/L (ref 20–32)
Calcium: 9.4 mg/dL (ref 8.6–10.3)
Chloride: 103 mmol/L (ref 98–110)
Creat: 1.32 mg/dL — ABNORMAL HIGH (ref 0.70–1.18)
GFR, Est African American: 62 mL/min/{1.73_m2} (ref >=60)
GFR, Est Non African American: 54 mL/min/{1.73_m2} — ABNORMAL LOW (ref >=60)
Globulin: 2.6 g/dL (calc) (ref 1.9–3.7)
Glucose, Bld: 91 mg/dL (ref 65–99)
Potassium: 4.3 mmol/L (ref 3.5–5.3)
Sodium: 139 mmol/L (ref 135–146)
Total Bilirubin: 0.8 mg/dL (ref 0.2–1.2)
Total Protein: 7.1 g/dL (ref 6.1–8.1)

## 2019-01-16 ENCOUNTER — Telehealth: Payer: Self-pay | Admitting: Clinical

## 2019-01-16 NOTE — Telephone Encounter (Signed)
Coordinated pt participation in [employer] Orientation on 01/16/2019

## 2019-01-16 NOTE — BH Specialist Note (Signed)
Pt presents for psychoeducation following new dx of Parkinson's Disease, pt education provided re non-motor sx including cognitive and mood, and value of forced intense exercise to manage sx in addition to medication. Pt provided with information for educational course, support group information, and rock steady boxing information. Pt endorses positive coping in current exercise routine, marital satisfaction and overall sense of well-being. [facility] will remain available for future consultation.  1. Patient to follow up with [facility] in: as needed 2. Medication Recommendation:3. Behavioral Recommendation(s):A: 6 wk education course for newly dx PD B: [employer], continue cycling.

## 2019-01-17 IMAGING — MR MR [PERSON_NAME] LOW W/O CM*L*
5 of 7 series · 27 of 40 positions shown · non-contrast
Comparison: Radiographs 10/30/2017

CLINICAL DATA: Chronic intermittent non pressure ulcer of the left
lower leg. History of remote injury and surgery.

EXAM:
MRI OF LOWER LEFT EXTREMITY WITHOUT CONTRAST
TECHNIQUE: Multiplanar, multisequence MR imaging of the left lower leg was
performed. No intravenous contrast was administered.

[Series 4: T1 · axial · 4.0mm · 0.69mm/px · z∈[-210,+222]mm · 9 of 88 slices shown (1 of 2)]
[im 1/88]
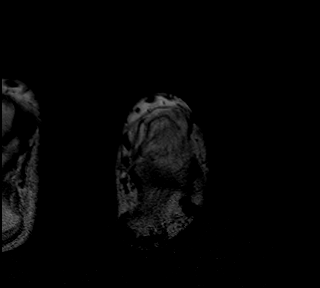
[im 16/88]
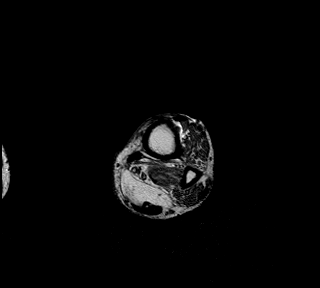
[im 24/88]
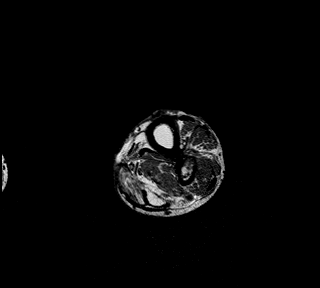
[im 40/88]
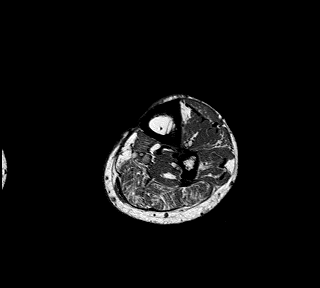
[im 48/88]
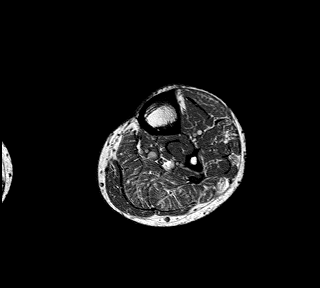
[im 64/88]
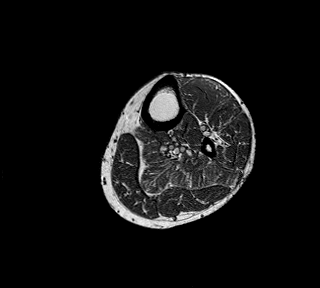
[im 72/88]
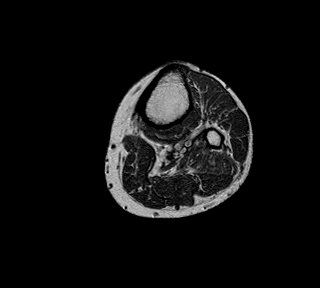
[im 80/88]
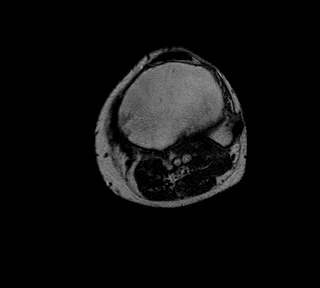
[im 88/88]
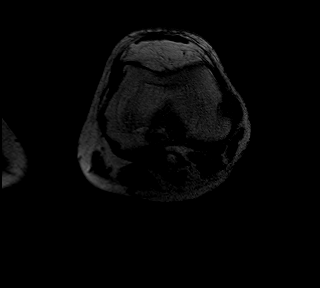

[Series 5: T2 fat-sat · axial · 4.0mm · 0.43mm/px · z∈[-224,+208]mm · 9 of 88 slices shown (1 of 3)]
[im 1/88]
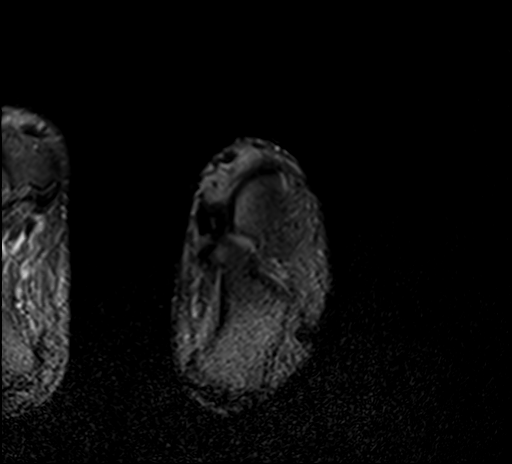
[im 16/88]
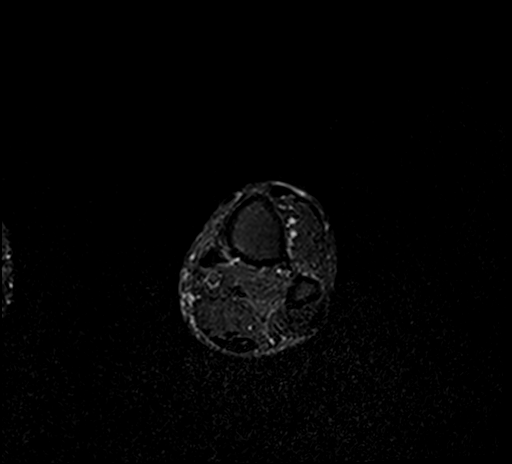
[im 24/88]
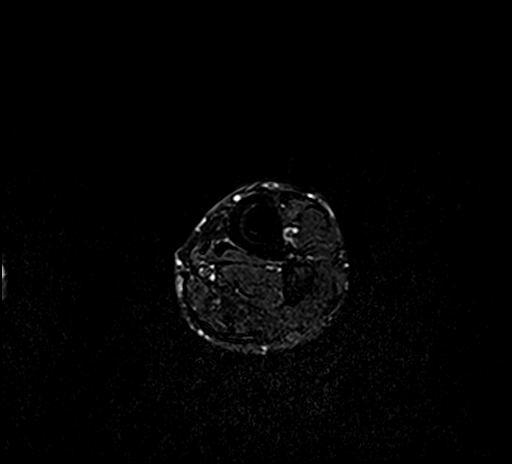
[im 40/88]
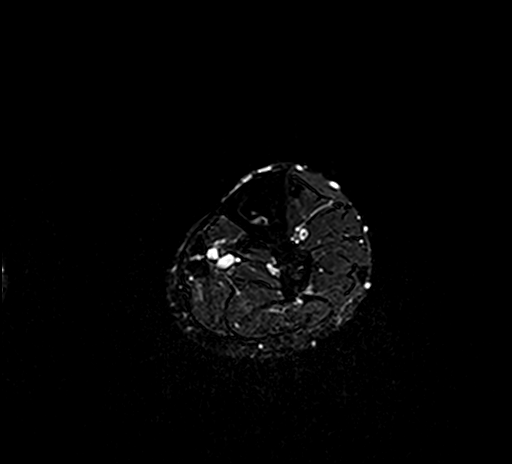
[im 48/88]
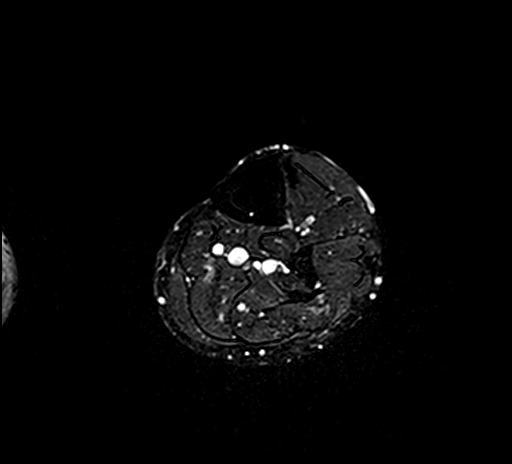
[im 64/88]
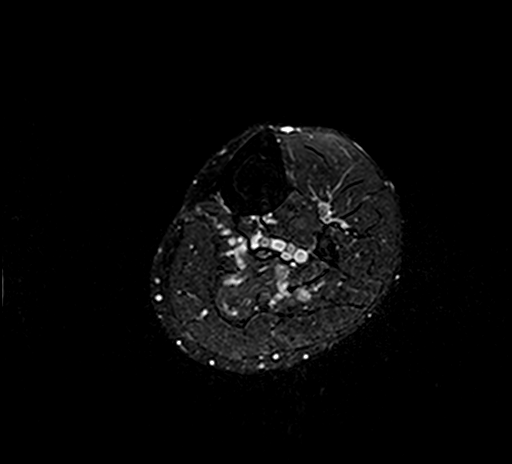
[im 72/88]
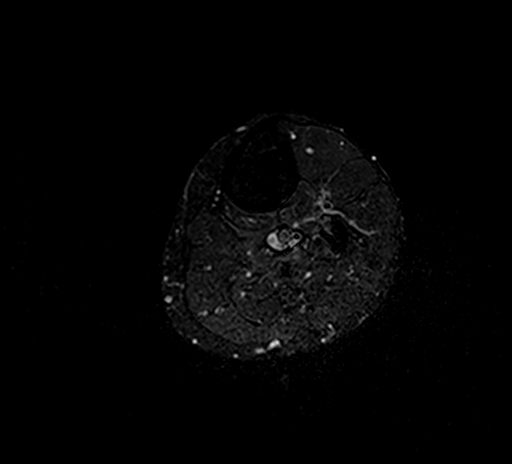
[im 80/88]
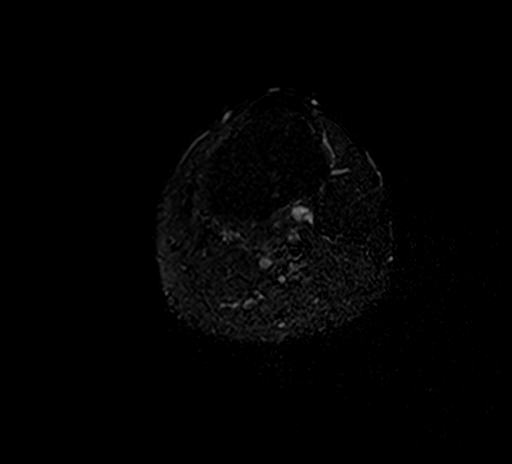
[im 88/88]
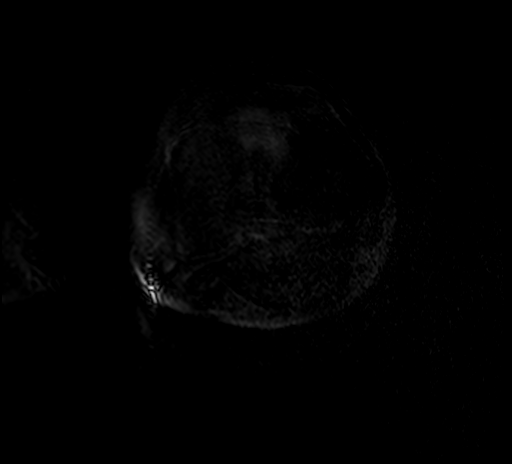

[Series 6: T1 · coronal · 4.0mm · 1.11mm/px · 3 of 30 slices shown (2 of 2)]
[im 1/30]
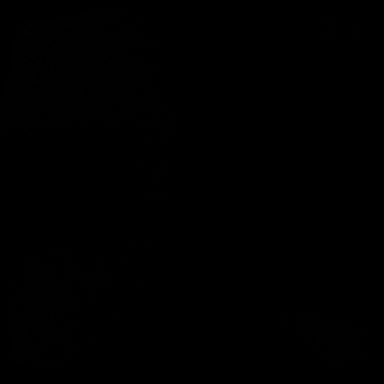
[im 10/30]
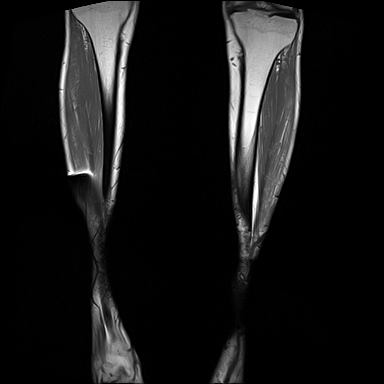
[im 20/30]
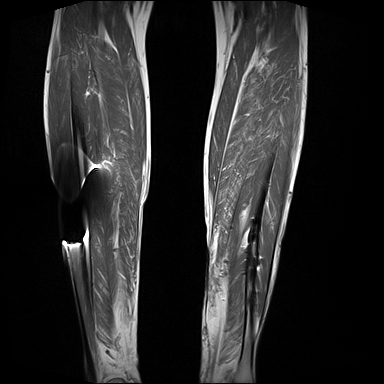

[Series 7: T2 fat-sat · coronal · 4.0mm · 0.78mm/px · 3 of 27 slices shown (2 of 3)]
[im 1/27]
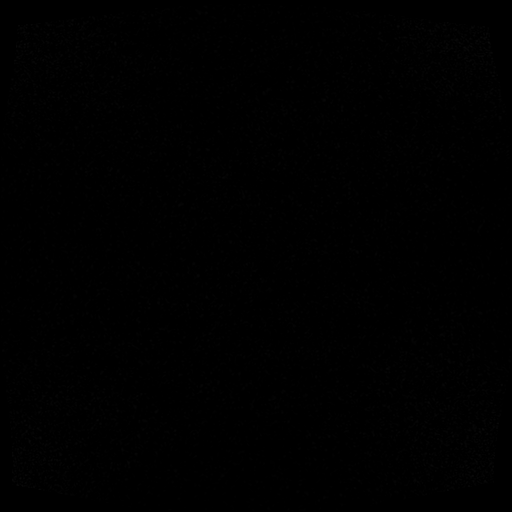
[im 14/27]
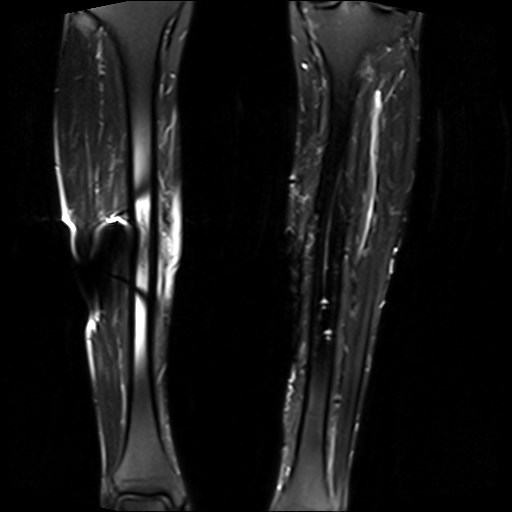
[im 27/27]
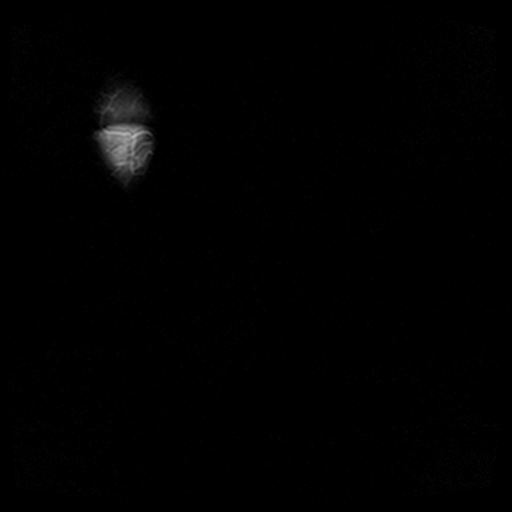

[Series 9: T2 fat-sat · sagittal · 4.0mm · 0.78mm/px · 3 of 27 slices shown (3 of 3)]
[im 1/27]
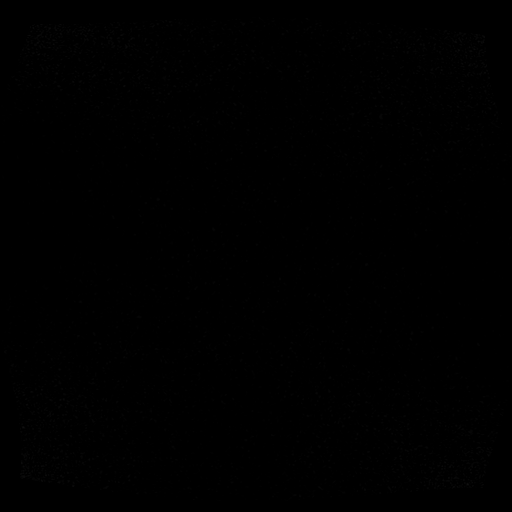
[im 14/27]
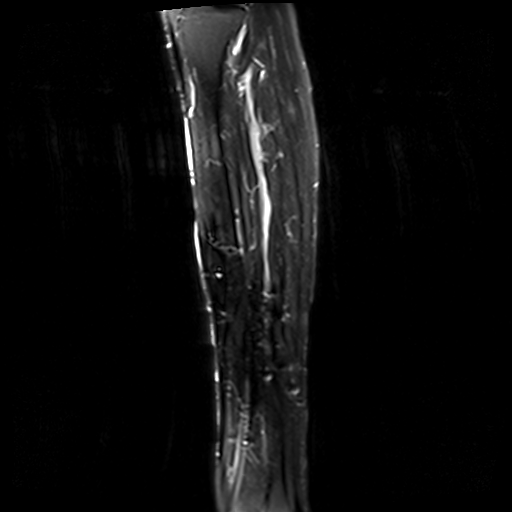
[im 27/27]
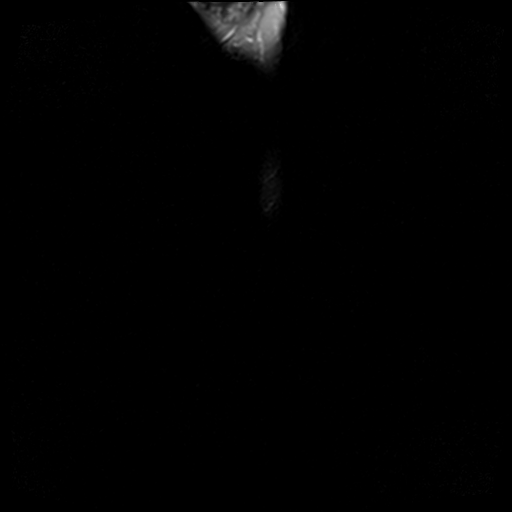

[27 of 40 positions shown; findings below may reference images not displayed]

FINDINGS: Bones/Joint/Cartilage

Examination was performed using the body coil. Both lower legs are
included on the coronal images.

As seen on the radiographs, there are extensive posttraumatic and
postsurgical deformities of the mid to distal left tibial and
fibular diaphyses. There is associated cortical thickening and
surrounding postsurgical susceptibility artifact. Probable areas of
intraosseous bridging across the syndesmosis. No cortical
destruction, marrow T2 hyperintensity or obvious exposed bone. No
osseous abnormalities are seen within the visualized right lower
leg. There is some susceptibility artifact laterally

Ligaments

Not relevant for exam/indication.

Muscles and Tendons

Mild fatty atrophy.  No T2 hyperintensity or focal fluid collection.

Soft tissues

The pretibial soft tissues are thinned and irregular, although no
focal fluid collection or obvious exposed tibial cortex identified.
There is no significant soft tissue edema.
IMPRESSION: 1. Posttraumatic deformities of the left tibia and fibula.
2. No evidence of ongoing osteomyelitis, focal abscess or
significant soft tissue inflammation.

## 2019-02-04 DIAGNOSIS — M542 Cervicalgia: Secondary | ICD-10-CM | POA: Insufficient documentation

## 2019-02-04 DIAGNOSIS — M4712 Other spondylosis with myelopathy, cervical region: Secondary | ICD-10-CM | POA: Diagnosis not present

## 2019-02-04 DIAGNOSIS — I10 Essential (primary) hypertension: Secondary | ICD-10-CM | POA: Diagnosis not present

## 2019-02-04 DIAGNOSIS — Z6827 Body mass index (BMI) 27.0-27.9, adult: Secondary | ICD-10-CM | POA: Diagnosis not present

## 2019-02-13 DIAGNOSIS — M47812 Spondylosis without myelopathy or radiculopathy, cervical region: Secondary | ICD-10-CM | POA: Diagnosis not present

## 2019-02-20 DIAGNOSIS — M48061 Spinal stenosis, lumbar region without neurogenic claudication: Secondary | ICD-10-CM | POA: Diagnosis not present

## 2019-02-20 DIAGNOSIS — Z981 Arthrodesis status: Secondary | ICD-10-CM | POA: Insufficient documentation

## 2019-02-21 DIAGNOSIS — Z981 Arthrodesis status: Secondary | ICD-10-CM | POA: Diagnosis not present

## 2019-02-25 DIAGNOSIS — M5416 Radiculopathy, lumbar region: Secondary | ICD-10-CM | POA: Diagnosis not present

## 2019-02-28 DIAGNOSIS — M65331 Trigger finger, right middle finger: Secondary | ICD-10-CM | POA: Diagnosis not present

## 2019-02-28 DIAGNOSIS — M5431 Sciatica, right side: Secondary | ICD-10-CM | POA: Diagnosis not present

## 2019-02-28 DIAGNOSIS — M1611 Unilateral primary osteoarthritis, right hip: Secondary | ICD-10-CM | POA: Diagnosis not present

## 2019-03-07 ENCOUNTER — Other Ambulatory Visit: Payer: Self-pay | Admitting: Neurosurgery

## 2019-03-07 DIAGNOSIS — M48061 Spinal stenosis, lumbar region without neurogenic claudication: Secondary | ICD-10-CM

## 2019-03-10 ENCOUNTER — Other Ambulatory Visit: Payer: Self-pay

## 2019-03-10 ENCOUNTER — Ambulatory Visit
Admission: RE | Admit: 2019-03-10 | Discharge: 2019-03-10 | Disposition: A | Discharge status: Home / Self Care | Payer: PPO | Source: Ambulatory Visit | Attending: Neurosurgery | Admitting: Neurosurgery

## 2019-03-10 DIAGNOSIS — M48061 Spinal stenosis, lumbar region without neurogenic claudication: Secondary | ICD-10-CM | POA: Diagnosis not present

## 2019-03-18 DIAGNOSIS — M5416 Radiculopathy, lumbar region: Secondary | ICD-10-CM | POA: Diagnosis not present

## 2019-03-20 ENCOUNTER — Other Ambulatory Visit: Payer: Self-pay | Admitting: Neurosurgery

## 2019-03-20 DIAGNOSIS — M4180 Other forms of scoliosis, site unspecified: Secondary | ICD-10-CM | POA: Insufficient documentation

## 2019-03-20 DIAGNOSIS — M5136 Other intervertebral disc degeneration, lumbar region: Secondary | ICD-10-CM | POA: Diagnosis not present

## 2019-03-20 DIAGNOSIS — M4807 Spinal stenosis, lumbosacral region: Secondary | ICD-10-CM | POA: Diagnosis not present

## 2019-03-20 DIAGNOSIS — M5417 Radiculopathy, lumbosacral region: Secondary | ICD-10-CM | POA: Diagnosis not present

## 2019-03-20 DIAGNOSIS — M5127 Other intervertebral disc displacement, lumbosacral region: Secondary | ICD-10-CM | POA: Insufficient documentation

## 2019-03-20 DIAGNOSIS — M4316 Spondylolisthesis, lumbar region: Secondary | ICD-10-CM | POA: Diagnosis not present

## 2019-03-20 DIAGNOSIS — M4150 Other secondary scoliosis, site unspecified: Secondary | ICD-10-CM | POA: Insufficient documentation

## 2019-03-20 DIAGNOSIS — M5137 Other intervertebral disc degeneration, lumbosacral region: Secondary | ICD-10-CM | POA: Insufficient documentation

## 2019-03-23 ENCOUNTER — Other Ambulatory Visit: Payer: PPO

## 2019-03-26 ENCOUNTER — Other Ambulatory Visit: Payer: Self-pay | Admitting: Neurosurgery

## 2019-03-26 NOTE — Progress Notes (Signed)
[id number] [facility], [place] - [address] [place] [address] Phone: [phone] Fax: [phone]      Your procedure is scheduled on March 31, 2019.  Report to [facility] Main Entrance "A" at 08:30 A.M., and check in at the Admitting office.  Call this number if you have problems the morning of surgery:  [phone]  Call [phone] if you have any questions prior to your surgery date Monday-Friday 8am-4pm    Remember:  Do not eat or drink after midnight the night before your surgery    Take these medicines the morning of surgery with A SIP OF WATER : Acetaminophen (Tylenol) if needed Carbidopa-Levodopa (Sinemet IR) Loratadine (Claritin) Tramadol (Ultram) if needed  7 days prior to surgery STOP taking any Aspirin (unless otherwise instructed by your surgeon), Aleve, Naproxen, Ibuprofen, Motrin, Advil, Goody's, BC's, all herbal medications, fish oil, and all vitamins.    The Morning of Surgery  Do not wear jewelry.  Do not wear lotions, powders, or perfumes/colognes, or deodorant  Do not shave 48 hours prior to surgery.  Men may shave face and neck.  Do not bring valuables to the hospital.  [facility] is not responsible for any belongings or valuables.  If you are a smoker, DO NOT Smoke 24 hours prior to surgery  If you wear a CPAP at night please bring your mask, tubing, and machine the morning of surgery   Remember that you must have someone to transport you home after your surgery, and remain with you for 24 hours if you are discharged the same day.  Please bring cases for contacts, glasses, hearing aids, dentures or bridgework because it cannot be worn into surgery.   Leave your suitcase in the car.  After surgery it may be brought to your room.  For patients admitted to the hospital, discharge time will be determined by your treatment team.  Patients discharged the day of surgery will not be allowed to drive home.     Special instructions:   - Preparing For Surgery  Before surgery, you can play an important role. Because skin is not sterile, your skin needs to be as free of germs as possible. You can reduce the number of germs on your skin by washing with CHG (chlorahexidine gluconate) Soap before surgery.  CHG is an antiseptic cleaner which kills germs and bonds with the skin to continue killing germs even after washing.    Oral Hygiene is also important to reduce your risk of infection.  Remember - BRUSH YOUR TEETH THE MORNING OF SURGERY WITH YOUR REGULAR TOOTHPASTE  Please do not use if you have an allergy to CHG or antibacterial soaps. If your skin becomes reddened/irritated stop using the CHG.  Do not shave (including legs and underarms) for at least 48 hours prior to first CHG shower. It is OK to shave your face.  Please follow these instructions carefully.   1. Shower the NIGHT BEFORE SURGERY and the MORNING OF SURGERY with CHG Soap.   2. If you chose to wash your hair, wash your hair first as usual with your normal shampoo.  3. After you shampoo, rinse your hair and body thoroughly to remove the shampoo.  4. Use CHG as you would any other liquid soap. You can apply CHG directly to the skin and wash gently with a scrungie or a clean washcloth.   5. Apply the CHG Soap to your body ONLY FROM THE NECK DOWN.  Do not use on open wounds or open sores. Avoid contact with your eyes, ears, mouth and genitals (private parts). Wash Face and genitals (private parts)  with your normal soap.   6. Wash thoroughly, paying special attention to the area where your surgery will be performed.  7. Thoroughly rinse your body with warm water from the neck down.  8. DO NOT shower/wash with your normal soap after using and rinsing off the CHG Soap.  9. Pat yourself dry with a CLEAN TOWEL.  10. Wear CLEAN PAJAMAS to bed the night before surgery, wear comfortable clothes the morning of  surgery  11. Place CLEAN SHEETS on your bed the night of your first shower and DO NOT SLEEP WITH PETS.    Day of Surgery:  Please shower the morning of surgery with the CHG soap Do not apply any deodorants/lotions. Please wear clean clothes to the hospital/surgery center.   Remember to brush your teeth WITH YOUR REGULAR TOOTHPASTE.   Please read over the following fact sheets that you were given.

## 2019-03-27 ENCOUNTER — Encounter (HOSPITAL_COMMUNITY)
Admission: RE | Admit: 2019-03-27 | Discharge: 2019-03-27 | Disposition: A | Discharge status: Home / Self Care | Payer: PPO | Source: Ambulatory Visit | Attending: Neurosurgery | Admitting: Neurosurgery

## 2019-03-27 ENCOUNTER — Other Ambulatory Visit (HOSPITAL_COMMUNITY)
Admission: RE | Admit: 2019-03-27 | Discharge: 2019-03-27 | Disposition: A | Discharge status: Home / Self Care | Payer: PPO | Source: Ambulatory Visit | Attending: Neurosurgery | Admitting: Neurosurgery

## 2019-03-27 ENCOUNTER — Encounter (HOSPITAL_COMMUNITY): Payer: Self-pay

## 2019-03-27 ENCOUNTER — Other Ambulatory Visit: Payer: Self-pay

## 2019-03-27 DIAGNOSIS — G20 Parkinson's disease: Secondary | ICD-10-CM | POA: Insufficient documentation

## 2019-03-27 DIAGNOSIS — I10 Essential (primary) hypertension: Secondary | ICD-10-CM | POA: Insufficient documentation

## 2019-03-27 DIAGNOSIS — Z20828 Contact with and (suspected) exposure to other viral communicable diseases: Secondary | ICD-10-CM | POA: Insufficient documentation

## 2019-03-27 DIAGNOSIS — R000 Tachycardia, unspecified: Secondary | ICD-10-CM | POA: Insufficient documentation

## 2019-03-27 DIAGNOSIS — L97909 Non-pressure chronic ulcer of unspecified part of unspecified lower leg with unspecified severity: Secondary | ICD-10-CM | POA: Diagnosis not present

## 2019-03-27 DIAGNOSIS — T753XXA Motion sickness, initial encounter: Secondary | ICD-10-CM | POA: Insufficient documentation

## 2019-03-27 DIAGNOSIS — Z01818 Encounter for other preprocedural examination: Secondary | ICD-10-CM | POA: Insufficient documentation

## 2019-03-27 DIAGNOSIS — I4510 Unspecified right bundle-branch block: Secondary | ICD-10-CM | POA: Insufficient documentation

## 2019-03-27 DIAGNOSIS — M545 Low back pain: Secondary | ICD-10-CM | POA: Diagnosis not present

## 2019-03-27 LAB — CBC
HCT: 46.2 % (ref 39.0–52.0)
Hemoglobin: 16.1 g/dL (ref 13.0–17.0)
MCH: 31.4 pg (ref 26.0–34.0)
MCHC: 34.8 g/dL (ref 30.0–36.0)
MCV: 90.1 fL (ref 80.0–100.0)
Platelets: 181 10*3/uL (ref 150–400)
RBC: 5.13 MIL/uL (ref 4.22–5.81)
RDW: 11.7 % (ref 11.5–15.5)
WBC: 7.7 10*3/uL (ref 4.0–10.5)
nRBC: 0 % (ref 0.0–0.2)

## 2019-03-27 LAB — BASIC METABOLIC PANEL
Anion gap: 12 (ref 5–15)
BUN: 12 mg/dL (ref 8–23)
CO2: 22 mmol/L (ref 22–32)
Calcium: 9.1 mg/dL (ref 8.9–10.3)
Chloride: 101 mmol/L (ref 98–111)
Creatinine, Ser: 1.17 mg/dL (ref 0.61–1.24)
GFR calc Af Amer: >60 mL/min (ref >60)
GFR calc non Af Amer: >60 mL/min (ref >60)
Glucose, Bld: 101 mg/dL — ABNORMAL HIGH (ref 70–99)
Potassium: 3.8 mmol/L (ref 3.5–5.1)
Sodium: 135 mmol/L (ref 135–145)

## 2019-03-27 LAB — GLUCOSE, CAPILLARY: Glucose-Capillary: 101 mg/dL — ABNORMAL HIGH (ref 70–99)

## 2019-03-27 LAB — TYPE AND SCREEN
ABO/RH(D): B POS
Antibody Screen: NEGATIVE

## 2019-03-27 LAB — SURGICAL PCR SCREEN
MRSA, PCR: NEGATIVE
Staphylococcus aureus: NEGATIVE

## 2019-03-27 LAB — ABO/RH: ABO/RH(D): B POS

## 2019-03-27 NOTE — Progress Notes (Signed)
PCP - Dr. Elsie Stain Cardiologist - Denies  PPM/ICD - Denies  Chest x-ray - Denies EKG - 03/27/2019 Stress Test - Denies ECHO - Denies Cardiac Cath - Denies  Sleep Study - No official sleep study, see primary notes. Wears CPAP PRN.   COVID TEST- 03/27/2019   Anesthesia review: Yes, EKG  Patient denies shortness of breath, fever, cough and chest pain at PAT appointment   All instructions explained to the patient, with a verbal understanding of the material. Patient agrees to go over the instructions while at home for a better understanding. Patient also instructed to self quarantine after being tested for COVID-19. The opportunity to ask questions was provided.   Pt arrived for appt, stated that he felt as if he were "going to pass out" while sitting in wheelchair in waiting room. Took to EKG room to lay on stretcher. Pt reports shaking more and feeling sick from car ride. Did not take dramamine prior to car ride and did not eat breakfast. VS and CBG obtained. Multiple EKGs obtained. Karoline Caldwell, PA-C called to evaluate pt. After laying on stretcher for 5 mins and drinking sprite, pt reported feeling better. Appt able to be completed. EKG repeated per Jeneen Rinks with better HR. Pt felt better to leave hospital and reported muscle trimmers had improved.

## 2019-03-27 NOTE — Progress Notes (Signed)
[id number] [name], [place] - [address] [address] Phone: [phone] Fax: [phone]      Your procedure is scheduled on March 31, 2019.  Report to [facility] Main Entrance "A" at 08:30 A.M., and check in at the Admitting office.  Call this number if you have problems the morning of surgery:  [phone]  Call [phone] if you have any questions prior to your surgery date Monday-Friday 8am-4pm    Remember:  Do not eat or drink after midnight the night before your surgery    Take these medicines the morning of surgery with A SIP OF WATER : Acetaminophen (Tylenol) if needed Loratadine (Claritin) Tramadol (Ultram) if needed Cyclobenzapine (Flexeril) if needed  7 days prior to surgery STOP taking any Aspirin (unless otherwise instructed by your surgeon), Aleve, Naproxen, Ibuprofen, Motrin, Advil, Goody's, BC's, all herbal medications, fish oil, and all vitamins.    The Morning of Surgery  Do not wear jewelry.  Do not wear lotions, powders, or perfumes/colognes, or deodorant  Do not shave 48 hours prior to surgery.  Men may shave face and neck.  Do not bring valuables to the hospital.  [facility] is not responsible for any belongings or valuables.  If you are a smoker, DO NOT Smoke 24 hours prior to surgery  If you wear a CPAP at night please bring your mask, tubing, and machine the morning of surgery   Remember that you must have someone to transport you home after your surgery, and remain with you for 24 hours if you are discharged the same day.  Please bring cases for contacts, glasses, hearing aids, dentures or bridgework because it cannot be worn into surgery.   Leave your suitcase in the car.  After surgery it may be brought to your room.  For patients admitted to the hospital, discharge time will be determined by your treatment team.  Patients discharged the day of surgery will not be allowed to drive home.     Special instructions:   [facility]- Preparing For Surgery  Before surgery, you can play an important role. Because skin is not sterile, your skin needs to be as free of germs as possible. You can reduce the number of germs on your skin by washing with CHG (chlorahexidine gluconate) Soap before surgery.  CHG is an antiseptic cleaner which kills germs and bonds with the skin to continue killing germs even after washing.    Oral Hygiene is also important to reduce your risk of infection.  Remember - BRUSH YOUR TEETH THE MORNING OF SURGERY WITH YOUR REGULAR TOOTHPASTE  Please do not use if you have an allergy to CHG or antibacterial soaps. If your skin becomes reddened/irritated stop using the CHG.  Do not shave (including legs and underarms) for at least 48 hours prior to first CHG shower. It is OK to shave your face.  Please follow these instructions carefully.   1. Shower the NIGHT BEFORE SURGERY and the MORNING OF SURGERY with CHG Soap.   2. If you chose to wash your hair, wash your hair first as usual with your normal shampoo.  3. After you shampoo, rinse your hair and body thoroughly to remove the shampoo.  4. Use CHG as you would any other liquid soap. You can apply CHG directly to the skin and wash gently with a scrungie or a clean washcloth.   5. Apply the CHG Soap to your body ONLY FROM THE NECK DOWN.  Do not use on open wounds or open sores. Avoid contact with your eyes, ears, mouth and genitals (private parts). Wash Face and genitals (private parts)  with your normal soap.   6. Wash thoroughly, paying special attention to the area where your surgery will be performed.  7. Thoroughly rinse your body with warm water from the neck down.  8. DO NOT shower/wash with your normal soap after using and rinsing off the CHG Soap.  9. Pat yourself dry with a CLEAN TOWEL.  10. Wear CLEAN PAJAMAS to bed the night before surgery, wear comfortable clothes the morning of  surgery  11. Place CLEAN SHEETS on your bed the night of your first shower and DO NOT SLEEP WITH PETS.    Day of Surgery:  Please shower the morning of surgery with the CHG soap Do not apply any deodorants/lotions. Please wear clean clothes to the hospital/surgery center.   Remember to brush your teeth WITH YOUR REGULAR TOOTHPASTE.   Please read over the following fact sheets that you were given.

## 2019-03-28 LAB — NOVEL CORONAVIRUS, NAA (HOSP ORDER, SEND-OUT TO REF LAB; TAT 18-24 HRS): SARS-CoV-2, NAA: NOT DETECTED

## 2019-03-28 NOTE — Anesthesia Preprocedure Evaluation (Addendum)
Anesthesia Evaluation  Patient identified by MRN, date of birth, ID band Patient awake    Reviewed: Allergy & Precautions, NPO status , Patient's Chart, lab work & pertinent test results  History of Anesthesia Complications Negative for: history of anesthetic complications  Airway Mallampati: II  TM Distance: >3 FB Neck ROM: Limited    Dental  (+) Dental Advisory Given, Teeth Intact   Pulmonary sleep apnea and Continuous Positive Airway Pressure Ventilation ,    Pulmonary exam normal        Cardiovascular hypertension (no meds), Pt. 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  Reproductive/Obstetrics                           Anesthesia Physical Anesthesia Plan  ASA: III  Anesthesia Plan: General   Post-op Pain Management:    Induction: Intravenous  PONV Risk Score and Plan: 3 and Treatment may vary due to age or medical condition, Ondansetron and Dexamethasone  Airway Management Planned: Oral ETT  Additional Equipment: None  Intra-op Plan:   Post-operative Plan: Extubation in OR  Informed Consent: I have reviewed the patients History and Physical, chart, labs and discussed the procedure including the risks, benefits and alternatives for the proposed anesthesia with the patient or authorized representative who has indicated his/her understanding and acceptance.     Dental advisory given  Plan Discussed with: CRNA and Anesthesiologist  Anesthesia Plan Comments:       Anesthesia Quick Evaluation

## 2019-03-28 NOTE — Progress Notes (Signed)
Anesthesia Chart Review:  I was called to evaluate pt at PAT as he reported he felt like he was "about to pass out". When I evaluated the pt he had already been moved to a stretcher in the EKG room and tracing was being recorded. Monitor showed sinus tach 108-135 with some atrial and ventricular ectopy. He denied any CP or SOB. He was feeling some better since laying down. He did have a very obvious and significant bilateral UE tremor, he has hx of Parkinson Disease but said the tremor was much worse this morning. He reported that he did not eat breakfast this morning, which is unusual for him, and the he also did not take dramamine before riding here in the car, which is also unusual because he has severe motion sickness and always takes dramamine before getting into a vehicle. He also says traffic was quite bad and he was feeling anxious and nauseated on the way here. His wife was driving. In addition, he is suffering from severe low back pain that he says is not well controlled at the moment. His diagnosis of Parkinson's is very recent, and that combined with his need for back surgery has somewhat overwhelmed him. He does endorse a history of anxiety.  On exam he appears anxious but not acutely ill. No diaphoresis, no pallor. He is tachycardic with ectopic beats, the rhythm is not irregular. His breathing is unlabored. He has no cough, fever, or other signs/symptoms of systemic illness. He is alert and oriented X 3. His initial blood pressure was 167/113 but was 129/92 on recheck. After speaking with and evaluating the pt he did seem to calm down and reported feeling much better. He was provided with soda, which he also said helped settle his stomach. His tremor improved to baseline. The remainer of the PAT appointment was carried out as usual and the pt continued to feel better. EKG was repeated at the end of his visit and showed sinus rhythm with one PVC, rate 93, incomplete RBBB. Tracing did not appear  significantly different from comparison in 2019. Based on exam and interview his symptoms seem most consistent with anxiety and exacerbation of acute low back pain. He was advised to continued to monitor his symptoms, and if he had any additional feelings of presyncope he was advised to call his PCP and/or be seen at ED.  Today's Vitals   03/27/19 0947 03/27/19 1008 03/27/19 1031  BP: (!) 167/113 (!) 129/92   Pulse: 100 (!) 110   Resp: 19    Temp: 36.9 C    TempSrc: Oral    SpO2: 98% 99%   Weight: 95.7 kg    Height: 6\' 3"  (1.905 m)    PainSc:   8    Body mass index is 26.37 kg/m.  Recent diagnosis of Parkinson's disease on 01/14/19. Followed by Dr. Carles Collet. Maintained on carbidopa/levodopa.  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 Iran Planas.   Preop labs reviewed, WNL.  EKG 03/27/19: Sinus rhythm with occasional Premature ventricular complexes. Rate 93. Incomplete right bundle branch block. Inferior infarct , age undetermined. Does not appear significantly changed from prior.    Wynonia Musty [facility] Short Stay Center/Anesthesiology Phone [phone] 03/28/2019 4:58 PM

## 2019-03-31 ENCOUNTER — Other Ambulatory Visit: Payer: Self-pay

## 2019-03-31 ENCOUNTER — Inpatient Hospital Stay (HOSPITAL_COMMUNITY): Payer: PPO | Admitting: Physician Assistant

## 2019-03-31 ENCOUNTER — Inpatient Hospital Stay (HOSPITAL_COMMUNITY): Payer: PPO | Admitting: Anesthesiology

## 2019-03-31 ENCOUNTER — Inpatient Hospital Stay (HOSPITAL_COMMUNITY): Payer: PPO

## 2019-03-31 ENCOUNTER — Inpatient Hospital Stay (HOSPITAL_COMMUNITY)
Admission: RE | Admit: 2019-03-31 | Discharge: 2019-04-01 | DRG: 454 | Disposition: A | Discharge status: Home / Self Care | Payer: PPO | Attending: Neurosurgery | Admitting: Neurosurgery

## 2019-03-31 ENCOUNTER — Encounter (HOSPITAL_COMMUNITY)
Admission: RE | Disposition: A | Discharge status: Home / Self Care | Payer: Self-pay | Source: Home / Self Care | Attending: Neurosurgery

## 2019-03-31 DIAGNOSIS — Z79899 Other long term (current) drug therapy: Secondary | ICD-10-CM

## 2019-03-31 DIAGNOSIS — Z885 Allergy status to narcotic agent status: Secondary | ICD-10-CM | POA: Diagnosis not present

## 2019-03-31 DIAGNOSIS — M48062 Spinal stenosis, lumbar region with neurogenic claudication: Secondary | ICD-10-CM | POA: Diagnosis not present

## 2019-03-31 DIAGNOSIS — Z20828 Contact with and (suspected) exposure to other viral communicable diseases: Secondary | ICD-10-CM | POA: Diagnosis not present

## 2019-03-31 DIAGNOSIS — G4733 Obstructive sleep apnea (adult) (pediatric): Secondary | ICD-10-CM | POA: Diagnosis not present

## 2019-03-31 DIAGNOSIS — M1990 Unspecified osteoarthritis, unspecified site: Secondary | ICD-10-CM | POA: Diagnosis not present

## 2019-03-31 DIAGNOSIS — M4326 Fusion of spine, lumbar region: Secondary | ICD-10-CM | POA: Diagnosis not present

## 2019-03-31 DIAGNOSIS — M4807 Spinal stenosis, lumbosacral region: Secondary | ICD-10-CM | POA: Diagnosis not present

## 2019-03-31 DIAGNOSIS — Z888 Allergy status to other drugs, medicaments and biological substances status: Secondary | ICD-10-CM

## 2019-03-31 DIAGNOSIS — Z981 Arthrodesis status: Secondary | ICD-10-CM | POA: Diagnosis not present

## 2019-03-31 DIAGNOSIS — K219 Gastro-esophageal reflux disease without esophagitis: Secondary | ICD-10-CM | POA: Diagnosis not present

## 2019-03-31 DIAGNOSIS — Y839 Surgical procedure, unspecified as the cause of abnormal reaction of the patient, or of later complication, without mention of misadventure at the time of the procedure: Secondary | ICD-10-CM | POA: Diagnosis present

## 2019-03-31 DIAGNOSIS — M960 Pseudarthrosis after fusion or arthrodesis: Secondary | ICD-10-CM | POA: Diagnosis present

## 2019-03-31 DIAGNOSIS — M5117 Intervertebral disc disorders with radiculopathy, lumbosacral region: Secondary | ICD-10-CM | POA: Diagnosis present

## 2019-03-31 DIAGNOSIS — Z8249 Family history of ischemic heart disease and other diseases of the circulatory system: Secondary | ICD-10-CM | POA: Diagnosis not present

## 2019-03-31 DIAGNOSIS — I10 Essential (primary) hypertension: Secondary | ICD-10-CM | POA: Diagnosis not present

## 2019-03-31 DIAGNOSIS — Z419 Encounter for procedure for purposes other than remedying health state, unspecified: Secondary | ICD-10-CM

## 2019-03-31 HISTORY — PX: LUMBAR SPINE SURGERY: SHX701

## 2019-03-31 SURGERY — POSTERIOR LUMBAR FUSION 1 LEVEL
Anesthesia: General | Site: Spine Lumbar

## 2019-03-31 MED ORDER — LACTATED RINGERS IV SOLN
INTRAVENOUS | Status: DC | PRN
  Administered 2019-03-31 (×2): via INTRAVENOUS

## 2019-03-31 MED ORDER — DOCUSATE SODIUM 100 MG PO CAPS
100.0000 mg | ORAL_CAPSULE | Freq: Two times a day (BID) | ORAL | Status: DC
  Administered 2019-03-31 – 2019-04-01 (×3): 100 mg via ORAL
  Filled 2019-03-31 (×3): qty 1

## 2019-03-31 MED ORDER — BUPIVACAINE LIPOSOME 1.3 % IJ SUSP
INTRAMUSCULAR | Status: DC | PRN
  Administered 2019-03-31: 20 mL

## 2019-03-31 MED ORDER — OXYCODONE HCL 5 MG/5ML PO SOLN: 5.0000 mg | Freq: Once | ORAL | Status: DC | PRN

## 2019-03-31 MED ORDER — BACITRACIN ZINC 500 UNIT/GM EX OINT
TOPICAL_OINTMENT | CUTANEOUS | Status: DC | PRN
  Administered 2019-03-31: 1 via TOPICAL

## 2019-03-31 MED ORDER — LORATADINE 10 MG PO TABS
10.0000 mg | ORAL_TABLET | Freq: Every day | ORAL | Status: DC
  Administered 2019-03-31 – 2019-04-01 (×2): 10 mg via ORAL
  Filled 2019-03-31 (×2): qty 1

## 2019-03-31 MED ORDER — SODIUM CHLORIDE 0.9 % IV SOLN
INTRAVENOUS | Status: DC | PRN
  Administered 2019-03-31: 500 mL

## 2019-03-31 MED ORDER — PHENYLEPHRINE HCL-NACL 10-0.9 MG/250ML-% IV SOLN
INTRAVENOUS | Status: DC | PRN
  Administered 2019-03-31: 25 ug/min via INTRAVENOUS

## 2019-03-31 MED ORDER — BACITRACIN ZINC 500 UNIT/GM EX OINT
TOPICAL_OINTMENT | CUTANEOUS | Status: AC
  Filled 2019-03-31: qty 28.35

## 2019-03-31 MED ORDER — SODIUM CHLORIDE 0.9 % IV SOLN
250.0000 mL | INTRAVENOUS | Status: DC
  Administered 2019-03-31: 250 mL via INTRAVENOUS

## 2019-03-31 MED ORDER — ONDANSETRON HCL 4 MG/2ML IJ SOLN
INTRAMUSCULAR | Status: DC | PRN
  Administered 2019-03-31: 4 mg via INTRAVENOUS

## 2019-03-31 MED ORDER — 0.9 % SODIUM CHLORIDE (POUR BTL) OPTIME
TOPICAL | Status: DC | PRN
  Administered 2019-03-31: 1000 mL

## 2019-03-31 MED ORDER — ONDANSETRON HCL 4 MG/2ML IJ SOLN: 4.0000 mg | Freq: Four times a day (QID) | INTRAMUSCULAR | Status: DC | PRN

## 2019-03-31 MED ORDER — CHLORHEXIDINE GLUCONATE CLOTH 2 % EX PADS: 6.0000 | MEDICATED_PAD | Freq: Once | CUTANEOUS | Status: DC

## 2019-03-31 MED ORDER — PHENYLEPHRINE 40 MCG/ML (10ML) SYRINGE FOR IV PUSH (FOR BLOOD PRESSURE SUPPORT)
PREFILLED_SYRINGE | INTRAVENOUS | Status: AC
  Filled 2019-03-31: qty 10

## 2019-03-31 MED ORDER — CEFAZOLIN SODIUM-DEXTROSE 2-4 GM/100ML-% IV SOLN
2.0000 g | INTRAVENOUS | Status: AC
  Administered 2019-03-31: 2 g via INTRAVENOUS

## 2019-03-31 MED ORDER — GLYCOPYRROLATE PF 0.2 MG/ML IJ SOSY
PREFILLED_SYRINGE | INTRAMUSCULAR | Status: AC
  Filled 2019-03-31: qty 1

## 2019-03-31 MED ORDER — FENTANYL CITRATE (PF) 250 MCG/5ML IJ SOLN
INTRAMUSCULAR | Status: AC
  Filled 2019-03-31: qty 5

## 2019-03-31 MED ORDER — THROMBIN 5000 UNITS EX SOLR
CUTANEOUS | Status: AC
  Filled 2019-03-31: qty 5000

## 2019-03-31 MED ORDER — PHENYLEPHRINE HCL (PRESSORS) 10 MG/ML IV SOLN
INTRAVENOUS | Status: AC
  Filled 2019-03-31: qty 1

## 2019-03-31 MED ORDER — MENTHOL 3 MG MT LOZG
1.0000 | LOZENGE | OROMUCOSAL | Status: DC | PRN
  Filled 2019-03-31: qty 9

## 2019-03-31 MED ORDER — SUGAMMADEX SODIUM 200 MG/2ML IV SOLN
INTRAVENOUS | Status: DC | PRN
  Administered 2019-03-31: 191.4 mg via INTRAVENOUS

## 2019-03-31 MED ORDER — MIDAZOLAM HCL 2 MG/2ML IJ SOLN
INTRAMUSCULAR | Status: DC | PRN
  Administered 2019-03-31: 2 mg via INTRAVENOUS

## 2019-03-31 MED ORDER — FENTANYL CITRATE (PF) 100 MCG/2ML IJ SOLN
INTRAMUSCULAR | Status: AC
  Filled 2019-03-31: qty 2

## 2019-03-31 MED ORDER — ROCURONIUM BROMIDE 10 MG/ML (PF) SYRINGE
PREFILLED_SYRINGE | INTRAVENOUS | Status: DC | PRN
  Administered 2019-03-31: 70 mg via INTRAVENOUS

## 2019-03-31 MED ORDER — BUPIVACAINE-EPINEPHRINE (PF) 0.5% -1:200000 IJ SOLN
INTRAMUSCULAR | Status: AC
  Filled 2019-03-31: qty 30

## 2019-03-31 MED ORDER — CYCLOBENZAPRINE HCL 10 MG PO TABS
10.0000 mg | ORAL_TABLET | Freq: Three times a day (TID) | ORAL | Status: DC | PRN
  Administered 2019-03-31: 10 mg via ORAL
  Filled 2019-03-31: qty 1

## 2019-03-31 MED ORDER — DEXAMETHASONE SODIUM PHOSPHATE 10 MG/ML IJ SOLN
INTRAMUSCULAR | Status: AC
  Filled 2019-03-31: qty 1

## 2019-03-31 MED ORDER — FENTANYL CITRATE (PF) 250 MCG/5ML IJ SOLN
INTRAMUSCULAR | Status: DC | PRN
  Administered 2019-03-31 (×3): 50 ug via INTRAVENOUS
  Administered 2019-03-31: 100 ug via INTRAVENOUS

## 2019-03-31 MED ORDER — PROPOFOL 10 MG/ML IV BOLUS
INTRAVENOUS | Status: DC | PRN
  Administered 2019-03-31: 150 mg via INTRAVENOUS

## 2019-03-31 MED ORDER — SODIUM CHLORIDE 0.9% FLUSH: 3.0000 mL | INTRAVENOUS | Status: DC | PRN

## 2019-03-31 MED ORDER — PHENYLEPHRINE 40 MCG/ML (10ML) SYRINGE FOR IV PUSH (FOR BLOOD PRESSURE SUPPORT)
PREFILLED_SYRINGE | INTRAVENOUS | Status: DC | PRN
  Administered 2019-03-31: 80 ug via INTRAVENOUS
  Administered 2019-03-31: 120 ug via INTRAVENOUS
  Administered 2019-03-31: 80 ug via INTRAVENOUS

## 2019-03-31 MED ORDER — TRAMADOL HCL 50 MG PO TABS: 100.0000 mg | ORAL_TABLET | Freq: Four times a day (QID) | ORAL | Status: DC | PRN

## 2019-03-31 MED ORDER — OXYCODONE HCL 5 MG PO TABS
10.0000 mg | ORAL_TABLET | ORAL | Status: DC | PRN
  Administered 2019-03-31 – 2019-04-01 (×4): 10 mg via ORAL
  Filled 2019-03-31 (×5): qty 2

## 2019-03-31 MED ORDER — MIDAZOLAM HCL 2 MG/2ML IJ SOLN
INTRAMUSCULAR | Status: AC
  Filled 2019-03-31: qty 2

## 2019-03-31 MED ORDER — BUPIVACAINE LIPOSOME 1.3 % IJ SUSP
20.0000 mL | Freq: Once | INTRAMUSCULAR | Status: DC
  Filled 2019-03-31: qty 20

## 2019-03-31 MED ORDER — ACETAMINOPHEN 650 MG RE SUPP: 650.0000 mg | RECTAL | Status: DC | PRN

## 2019-03-31 MED ORDER — ONDANSETRON HCL 4 MG PO TABS
4.0000 mg | ORAL_TABLET | Freq: Four times a day (QID) | ORAL | Status: DC | PRN
  Administered 2019-04-01: 4 mg via ORAL
  Filled 2019-03-31: qty 1

## 2019-03-31 MED ORDER — PROPOFOL 10 MG/ML IV BOLUS
INTRAVENOUS | Status: AC
  Filled 2019-03-31: qty 40

## 2019-03-31 MED ORDER — LIDOCAINE 2% (20 MG/ML) 5 ML SYRINGE
INTRAMUSCULAR | Status: AC
  Filled 2019-03-31: qty 5

## 2019-03-31 MED ORDER — CEFAZOLIN SODIUM-DEXTROSE 2-4 GM/100ML-% IV SOLN
2.0000 g | Freq: Three times a day (TID) | INTRAVENOUS | Status: AC
  Administered 2019-03-31 (×2): 2 g via INTRAVENOUS
  Filled 2019-03-31 (×2): qty 100

## 2019-03-31 MED ORDER — SODIUM CHLORIDE 0.9% FLUSH
3.0000 mL | Freq: Two times a day (BID) | INTRAVENOUS | Status: DC
  Administered 2019-03-31 (×2): 3 mL via INTRAVENOUS

## 2019-03-31 MED ORDER — ONDANSETRON HCL 4 MG/2ML IJ SOLN: 4.0000 mg | Freq: Once | INTRAMUSCULAR | Status: DC | PRN

## 2019-03-31 MED ORDER — LIDOCAINE 2% (20 MG/ML) 5 ML SYRINGE
INTRAMUSCULAR | Status: DC | PRN
  Administered 2019-03-31: 80 mg via INTRAVENOUS

## 2019-03-31 MED ORDER — DEXAMETHASONE SODIUM PHOSPHATE 10 MG/ML IJ SOLN
INTRAMUSCULAR | Status: DC | PRN
  Administered 2019-03-31: 5 mg via INTRAVENOUS

## 2019-03-31 MED ORDER — ROCURONIUM BROMIDE 10 MG/ML (PF) SYRINGE
PREFILLED_SYRINGE | INTRAVENOUS | Status: AC
  Filled 2019-03-31: qty 10

## 2019-03-31 MED ORDER — PHENOL 1.4 % MT LIQD: 1.0000 | OROMUCOSAL | Status: DC | PRN

## 2019-03-31 MED ORDER — BUPIVACAINE-EPINEPHRINE (PF) 0.5% -1:200000 IJ SOLN
INTRAMUSCULAR | Status: DC | PRN
  Administered 2019-03-31: 10 mL

## 2019-03-31 MED ORDER — THROMBIN 5000 UNITS EX SOLR
OROMUCOSAL | Status: DC | PRN
  Administered 2019-03-31: 5 mL via TOPICAL

## 2019-03-31 MED ORDER — OXYCODONE HCL 5 MG PO TABS
5.0000 mg | ORAL_TABLET | ORAL | Status: DC | PRN
  Administered 2019-04-01: 5 mg via ORAL
  Filled 2019-03-31: qty 1

## 2019-03-31 MED ORDER — AMLODIPINE BESYLATE 5 MG PO TABS
5.0000 mg | ORAL_TABLET | Freq: Every day | ORAL | Status: DC
  Administered 2019-03-31 – 2019-04-01 (×2): 5 mg via ORAL
  Filled 2019-03-31 (×2): qty 1

## 2019-03-31 MED ORDER — HYDROMORPHONE HCL 1 MG/ML IJ SOLN
1.0000 mg | INTRAMUSCULAR | Status: DC | PRN
  Administered 2019-03-31: 1 mg via INTRAVENOUS
  Filled 2019-03-31: qty 1

## 2019-03-31 MED ORDER — CEFAZOLIN SODIUM-DEXTROSE 2-4 GM/100ML-% IV SOLN
INTRAVENOUS | Status: AC
  Filled 2019-03-31: qty 100

## 2019-03-31 MED ORDER — OXYCODONE HCL 5 MG PO TABS: 5.0000 mg | ORAL_TABLET | Freq: Once | ORAL | Status: DC | PRN

## 2019-03-31 MED ORDER — ACETAMINOPHEN 325 MG PO TABS: 650.0000 mg | ORAL_TABLET | ORAL | Status: DC | PRN

## 2019-03-31 MED ORDER — FENTANYL CITRATE (PF) 100 MCG/2ML IJ SOLN
25.0000 ug | INTRAMUSCULAR | Status: DC | PRN
  Administered 2019-03-31 (×2): 50 ug via INTRAVENOUS

## 2019-03-31 MED ORDER — ONDANSETRON HCL 4 MG/2ML IJ SOLN
INTRAMUSCULAR | Status: AC
  Filled 2019-03-31: qty 2

## 2019-03-31 MED ORDER — ACETAMINOPHEN 500 MG PO TABS
1000.0000 mg | ORAL_TABLET | Freq: Four times a day (QID) | ORAL | Status: AC
  Administered 2019-03-31 – 2019-04-01 (×4): 1000 mg via ORAL
  Filled 2019-03-31 (×4): qty 2

## 2019-03-31 MED ORDER — BISACODYL 10 MG RE SUPP: 10.0000 mg | Freq: Every day | RECTAL | Status: DC | PRN

## 2019-03-31 SURGICAL SUPPLY — 72 items
BAG DECANTER FOR FLEXI CONT (MISCELLANEOUS) ×3 IMPLANT
BENZOIN TINCTURE PRP APPL 2/3 (GAUZE/BANDAGES/DRESSINGS) ×3 IMPLANT
BLADE CLIPPER SURG (BLADE) ×3 IMPLANT
BUR MATCHSTICK NEURO 3.0 LAGG (BURR) ×3 IMPLANT
BUR PRECISION FLUTE 6.0 (BURR) ×3 IMPLANT
CAGE ALTERA 10X31X9-13 15D (Cage) ×2 IMPLANT
CAGE ALTERA 9-13-15-31MM (Cage) ×1 IMPLANT
CANISTER SUCT 3000ML PPV (MISCELLANEOUS) ×3 IMPLANT
CAP LOCK DLX THRD (Cap) ×6 IMPLANT
CAP REVERE LOCKING (Cap) ×12 IMPLANT
CARTRIDGE OIL MAESTRO DRILL (MISCELLANEOUS) ×1 IMPLANT
CLOSURE WOUND 1/2 X4 (GAUZE/BANDAGES/DRESSINGS) ×1
CONT SPEC 4OZ CLIKSEAL STRL BL (MISCELLANEOUS) ×3 IMPLANT
COVER BACK TABLE 60X90IN (DRAPES) ×3 IMPLANT
COVER WAND RF STERILE (DRAPES) IMPLANT
DECANTER SPIKE VIAL GLASS SM (MISCELLANEOUS) IMPLANT
DIFFUSER DRILL AIR PNEUMATIC (MISCELLANEOUS) ×3 IMPLANT
DRAPE C-ARM 42X72 X-RAY (DRAPES) ×6 IMPLANT
DRAPE HALF SHEET 40X57 (DRAPES) ×3 IMPLANT
DRAPE LAPAROTOMY 100X72X124 (DRAPES) ×3 IMPLANT
DRAPE SURG 17X23 STRL (DRAPES) ×12 IMPLANT
DRSG OPSITE POSTOP 4X6 (GAUZE/BANDAGES/DRESSINGS) ×3 IMPLANT
ELECT BLADE 4.0 EZ CLEAN MEGAD (MISCELLANEOUS) ×3
ELECT REM PT RETURN 9FT ADLT (ELECTROSURGICAL) ×3
ELECTRODE BLDE 4.0 EZ CLN MEGD (MISCELLANEOUS) ×1 IMPLANT
ELECTRODE REM PT RTRN 9FT ADLT (ELECTROSURGICAL) ×1 IMPLANT
EVACUATOR 1/8 PVC DRAIN (DRAIN) IMPLANT
GAUZE 4X4 16PLY RFD (DISPOSABLE) IMPLANT
GAUZE SPONGE 4X4 12PLY STRL (GAUZE/BANDAGES/DRESSINGS) IMPLANT
GLOVE BIO SURGEON STRL SZ 6.5 (GLOVE) ×2 IMPLANT
GLOVE BIO SURGEON STRL SZ7 (GLOVE) ×6 IMPLANT
GLOVE BIO SURGEON STRL SZ8 (GLOVE) ×6 IMPLANT
GLOVE BIO SURGEON STRL SZ8.5 (GLOVE) ×6 IMPLANT
GLOVE BIO SURGEONS STRL SZ 6.5 (GLOVE) ×1
GLOVE BIOGEL PI IND STRL 6.5 (GLOVE) ×1 IMPLANT
GLOVE BIOGEL PI IND STRL 8 (GLOVE) ×2 IMPLANT
GLOVE BIOGEL PI INDICATOR 6.5 (GLOVE) ×2
GLOVE BIOGEL PI INDICATOR 8 (GLOVE) ×4
GLOVE ECLIPSE 7.5 STRL STRAW (GLOVE) ×9 IMPLANT
GLOVE EXAM NITRILE XL STR (GLOVE) IMPLANT
GOWN STRL REUS W/ TWL LRG LVL3 (GOWN DISPOSABLE) IMPLANT
GOWN STRL REUS W/ TWL XL LVL3 (GOWN DISPOSABLE) ×2 IMPLANT
GOWN STRL REUS W/TWL 2XL LVL3 (GOWN DISPOSABLE) ×3 IMPLANT
GOWN STRL REUS W/TWL LRG LVL3 (GOWN DISPOSABLE)
GOWN STRL REUS W/TWL XL LVL3 (GOWN DISPOSABLE) ×4
HEMOSTAT POWDER KIT SURGIFOAM (HEMOSTASIS) ×3 IMPLANT
KIT BASIN OR (CUSTOM PROCEDURE TRAY) ×3 IMPLANT
KIT TURNOVER KIT B (KITS) ×3 IMPLANT
MILL MEDIUM DISP (BLADE) IMPLANT
NEEDLE HYPO 21X1.5 SAFETY (NEEDLE) ×3 IMPLANT
NEEDLE HYPO 22GX1.5 SAFETY (NEEDLE) ×3 IMPLANT
NS IRRIG 1000ML POUR BTL (IV SOLUTION) ×3 IMPLANT
OIL CARTRIDGE MAESTRO DRILL (MISCELLANEOUS) ×3
PACK LAMINECTOMY NEURO (CUSTOM PROCEDURE TRAY) ×3 IMPLANT
PAD ARMBOARD 7.5X6 YLW CONV (MISCELLANEOUS) ×15 IMPLANT
PATTIES SURGICAL .5 X1 (DISPOSABLE) IMPLANT
PUTTY DBM 10CC CALC GRAN (Putty) ×3 IMPLANT
ROD CURVED TI 6.35X70 (Rod) ×6 IMPLANT
SCREW PA DLX CREO 7.5X50 (Screw) ×6 IMPLANT
SPONGE LAP 4X18 RFD (DISPOSABLE) IMPLANT
SPONGE NEURO XRAY DETECT 1X3 (DISPOSABLE) IMPLANT
SPONGE SURGIFOAM ABS GEL 100 (HEMOSTASIS) IMPLANT
STRIP CLOSURE SKIN 1/2X4 (GAUZE/BANDAGES/DRESSINGS) ×2 IMPLANT
SUT VIC AB 1 CT1 18XBRD ANBCTR (SUTURE) ×2 IMPLANT
SUT VIC AB 1 CT1 8-18 (SUTURE) ×4
SUT VIC AB 2-0 CP2 18 (SUTURE) ×6 IMPLANT
SUT VIC AB 3-0 SH 8-18 (SUTURE) IMPLANT
SYR 20ML LL LF (SYRINGE) ×3 IMPLANT
TOWEL GREEN STERILE (TOWEL DISPOSABLE) ×3 IMPLANT
TOWEL GREEN STERILE FF (TOWEL DISPOSABLE) ×3 IMPLANT
TRAY FOLEY MTR SLVR 16FR STAT (SET/KITS/TRAYS/PACK) ×3 IMPLANT
WATER STERILE IRR 1000ML POUR (IV SOLUTION) ×3 IMPLANT

## 2019-03-31 NOTE — Anesthesia Procedure Notes (Signed)
Procedure Name: Intubation Date/Time: 03/31/2019 7:46 AM Performed by: Kathryne Hitch, CRNA Pre-anesthesia Checklist: Patient identified, Emergency Drugs available, Suction available and Patient being monitored Patient Re-evaluated:Patient Re-evaluated prior to induction Oxygen Delivery Method: Circle system utilized Preoxygenation: Pre-oxygenation with 100% oxygen Induction Type: IV induction Ventilation: Mask ventilation without difficulty and Oral airway inserted - appropriate to patient size Laryngoscope Size: Sabra Heck and 3 Grade View: Grade I Tube type: Oral Tube size: 8.0 mm Number of attempts: 1 Airway Equipment and Method: Stylet and Oral airway Placement Confirmation: ETT inserted through vocal cords under direct vision,  positive ETCO2 and breath sounds checked- equal and bilateral Secured at: 23 cm Tube secured with: Tape Dental Injury: Teeth and Oropharynx as per pre-operative assessment

## 2019-03-31 NOTE — Anesthesia Postprocedure Evaluation (Signed)
Anesthesia Post Note  Patient: [name]  Procedure(s) Performed: POSTERIOR LUMBAR INTERBODY FUSION, POSTERIOR INSTRUMENTATION LUMBAR FIVE- SACRAL ONE; EXPLORE FUSION (N/A Spine Lumbar)     Patient location during evaluation: PACU Anesthesia Type: General Level of consciousness: awake and alert Pain management: pain level controlled Vital Signs Assessment: post-procedure vital signs reviewed and stable Respiratory status: spontaneous breathing, nonlabored ventilation and respiratory function stable Cardiovascular status: blood pressure returned to baseline and stable Postop Assessment: no apparent nausea or vomiting Anesthetic complications: no    Last Vitals:  Vitals:   03/31/19 1245 03/31/19 1320  BP:  (!) 162/98  Pulse: 83 92  Resp: 10 17  Temp: 36.7 C 36.9 C  SpO2: 93%     Last Pain:  Vitals:   03/31/19 1320  TempSrc: Oral  PainSc:                  Audry Pili

## 2019-03-31 NOTE — Op Note (Signed)
Brief history: The patient is a 73 year old white male on whom I performed an L4-5 decompression, instrumentation and fusion in 2016.  He did well after surgery but recently has developed severe right leg pain consistent with an L5 radiculopathy.  He has failed medical management and was worked up with a lumbar MRI which demonstrated a far lateral herniated disc and foraminal stenosis at L5-S1.  I discussed the various treatment options with him.  He has weighed the risks, benefits and alternatives and decided to proceed with an exploration of his lumbar fusion with an L5-S1 decompression, instrumentation and fusion.  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 requiring a wide decompression of the nerve roots.);  L5-S1 transforaminal lumbar interbody fusion with local morselized autograft bone and Zimmer DBM; insertion of interbody prosthesis at L5-S1 (globus peek expandable interbody prosthesis); posterior segmental instrumentation from L4 to S1 with globus titanium pedicle screws and rods; posterior lateral arthrodesis at 4 5 and L5-S1 with local morselized autograft bone and Zimmer DBM; explosion of lumbar fusion/removal of lumbar hardware  Surgeon: Dr. Earle Gell  Asst.: Arnetha Massy nurse practitioner  Anesthesia: Gen. endotracheal  Estimated blood loss: 150 cc  Drains: None  Complications: None  Description of procedure: The patient was brought to the operating room by the anesthesia team. General endotracheal anesthesia  was induced. The patient was turned to the prone position on the Wilson frame. The patient's lumbosacral region was then prepared with Betadine scrub and Betadine solution. Sterile drapes were applied.  I then injected the area to be incised with Marcaine with epinephrine solution. I then used the scalpel to make a linear midline incision over the L4-5 and L5-S1 interspace. I then used electrocautery to perform a bilateral subperiosteal dissection exposing the spinous process and lamina of L4, L5 and S1, and exposing the old hardware at L4-5.  We then inserted the Verstrac retractor to provide exposure.  We explored the fusion by removing the caps from the old screws then removed the rods.  I inspected the posterior lateral arthrodesis.  I could not see any bony fusion posterior laterally.  The screws were not loose.  I began the decompression by using the high speed drill to perform laminotomies at L5-S1 bilaterally. We then used the Kerrison punches to widen the laminotomy and removed the ligamentum flavum at L5-S1 bilaterally. We used the Kerrison punches to remove the medial facets at L5-S1 bilaterally.  I removed the right L5-S1 facet.  We performed wide foraminotomies about the bilateral L5 and S1 nerve roots completing the decompression.  We now turned our attention to the posterior lumbar interbody fusion. I used a scalpel to incise the intervertebral disc at L5-S1 bilaterally. I then performed a partial intervertebral discectomy at L5-S1 bilaterally using the pituitary forceps.  Of note we did find a fairly large far lateral herniated disc at L5-S1 on the right compressing the exiting right L5 nerve root.  We removed it with a pituitary forceps decompressing the L5 nerve root.  We prepared the vertebral endplates at 075-GRM bilaterally for the fusion by removing the soft tissues with the curettes. We then used the  trial spacers to pick the appropriate sized interbody prosthesis. We prefilled his  prosthesis with a combination of local morselized autograft bone that we obtained during the decompression as well as Zimmer DBM. We inserted the prefilled prosthesis into the interspace at L5-S1 from the right, we then turned and expanded the prosthesis. There was a good snug fit of the prosthesis in the interspace. We then filled and the remainder of the intervertebral disc space with local morselized autograft bone and Zimmer DBM. This completed the posterior lumbar interbody arthrodesis.  During the decompression and insertion of the prosthesis the assistant protected the thecal sac and nerve roots with the D'Errico retractor.  We now turned attention to the instrumentation. Under fluoroscopic guidance we cannulated the bilateral S1 pedicles with the bone probe. We then removed the bone probe. We then tapped the pedicle with a 6.5 millimeter tap. We then removed the tap. We probed inside the tapped pedicle with a ball probe to rule out cortical breaches. We then inserted a 7.5 x 50 millimeter pedicle screw into the S1 pedicles bilaterally under fluoroscopic guidance. We then palpated along the medial aspect of the pedicles to rule out cortical breaches. There were none. The nerve roots were not injured. We then connected the unilateral pedicle screws with a lordotic rod. We compressed the construct at L5-S1 and secured the rod in place with the caps. We then tightened the caps appropriately. This completed the instrumentation from L4-S1 bilaterally.  We now turned our attention to the posterior lateral arthrodesis at L5-S1 bilaterally and the redo L4-5 posterior lateral fusion bilaterally. We used the high-speed drill to decorticate the remainder of the facets, pars, transverse process at L5-S1 and L4-5 and remove the soft tissue at L4-5.Marland Kitchen We then applied a combination of local morselized autograft bone and Zimmer DBM over these decorticated posterior lateral structures. This completed the posterior lateral  arthrodesis at L4-5 and L5-S1.  We then obtained hemostasis using bipolar electrocautery. We irrigated the wound out with bacitracin solution. We inspected the thecal sac and nerve roots and noted they were well decompressed. We then removed the retractor.  We injected Exparel . We reapproximated patient's thoracolumbar fascia with interrupted #1 Vicryl suture. We reapproximated patient's subcutaneous tissue with interrupted 2-0 Vicryl suture. The reapproximated patient's skin with Steri-Strips and benzoin. The wound was then coated with bacitracin ointment. A sterile dressing was applied. The drapes were removed. The patient was subsequently returned to the supine position where they were extubated by the anesthesia team. He was then transported to the post anesthesia care unit in stable condition. All sponge instrument and needle counts were reportedly correct at the end of this case.

## 2019-03-31 NOTE — H&P (Signed)
Subjective: The patient is a 73 year old white male on whom I performed an L4-5 decompression, instrumentation and fusion approximately 4 years ago.  He did very well until recently when he began having pain rating down his right leg consistent with an L5 radiculopathy.  He has failed medical management.  He was worked up with lumbar x-rays a lumbar MRI which demonstrated L5-S1 foraminal stenosis.  I discussed the various treatment options including surgery.  He has decided proceed with an L5-S1 decompression, instrumentation and fusion.  Past Medical History:  Diagnosis Date  . Arthritis   . GERD (gastroesophageal reflux disease)   . H/O urinary frequency   . Heart murmur    as a child  . History of bronchitis   . HSV infection    oral  . Hypertension    improved after back pain treated with surgery  . Insomnia   . Joint pain   . OSA on CPAP    he ordered kit and started use 2017 w/o formal testing, see note from 11/30/15.   . Vertigo 10/13/2014   recurrent; trigger is cool air on left ear, improved with daily antihistamine    Past Surgical History:  Procedure Laterality Date  . ACHILLES TENDON REPAIR Left   . BACK SURGERY    . COLONOSCOPY  02/2011   Negative,[facility]  . DEBRIDEMENT AND CLOSURE WOUND Left 02/25/2018   Procedure: DEBRIDEMENT LEFT LEG APPLICATION INTEGRA;  Surgeon: Irene Limbo, MD;  Location: [facility];  Service: Plastics;  Laterality: Left;  . EYE SURGERY Bilateral    cataracts  . KNEE ARTHROSCOPY     L  . LUMBAR FUSION     2016  . SPINE SURGERY  2003   cervical fusion  . TONSILLECTOMY    . varicoelectomy      Allergies  Allergen Reactions  . Flomax [Tamsulosin Hcl] Other (See Comments)    Lack of effect, not allergy or intolerance.   . Morphine And Related Hives    Hallucinations- can tolerate oxycodone.     Social History   Tobacco Use  . Smoking status: Never Smoker  . Smokeless tobacco: Never Used  Substance Use  Topics  . Alcohol use: No    Family History  Problem Relation Age of Onset  . Hypertension Mother   . Asthma Mother   . Diabetes Father   . Stroke Father   . Heart attack Paternal Grandfather        >55  . Cancer Sister   . Colon cancer Neg Hx   . Prostate cancer Neg Hx    Prior to Admission medications   Medication Sig Start Date End Date Taking? Authorizing Provider  acetaminophen (TYLENOL) 500 MG tablet Take 1,000 mg by mouth every 6 (six) hours as needed for moderate pain.   Yes [provider]  amLODipine (NORVASC) 5 MG tablet Take 5 mg by mouth as needed.   Yes Tonia Ghent, MD  carbidopa-levodopa (SINEMET IR) 25-100 MG tablet Take 1 tablet by mouth 3 (three) times daily. Patient not taking: Reported on 03/27/2019 01/14/19  Yes Tat, Eustace Quail, DO  Cholecalciferol (VITAMIN D) 125 MCG (5000 UT) CAPS Take 5,000 Units by mouth daily.   Yes [provider]  cyclobenzaprine (FLEXERIL) 10 MG tablet TAKE 0.5-1 TABLETS (5-10 MG TOTAL) BY MOUTH 3 (THREE) TIMES DAILY AS NEEDED FOR MUSCLE SPASMS. 02/24/18  Yes Tonia Ghent, MD  Echinacea 450 MG CAPS Take 450 mg by mouth daily.  Yes [provider]  loratadine (CLARITIN) 10 MG tablet Take 10 mg by mouth daily.   Yes [provider]  Menthol, Topical Analgesic, (BIOFREEZE EX) Apply 1 application topically daily as needed (pain).   Yes [provider]  Multiple Vitamin (MULTIVITAMIN WITH MINERALS) TABS tablet Take 1 tablet by mouth daily.    Yes [provider]  traMADol (ULTRAM) 50 MG tablet Take 50 mg by mouth every 6 (six) hours as needed for moderate pain.   Yes [provider]  zinc gluconate 50 MG tablet Take 50 mg by mouth daily.   Yes [provider]  benzonatate (TESSALON) 200 MG capsule Take 1 capsule (200 mg total) by mouth 3 (three) times daily as needed. Patient not taking: Reported on 01/14/2019 04/23/18   Tonia Ghent, MD     Review of  Systems  Positive ROS: As above  All other systems have been reviewed and were otherwise negative with the exception of those mentioned in the HPI and as above.  Objective: Vital signs in last 24 hours: Temp:  [98.4 F (36.9 C)] 98.4 F (36.9 C) (12/14 0611) Pulse Rate:  [100] 100 (12/14 0611) Resp:  [18] 18 (12/14 0611) BP: (160-164)/(84-101) 164/84 (12/14 0652) SpO2:  [96 %] 96 % (12/14 0611) Weight:  [95.7 kg] 95.7 kg (12/14 0647) Estimated body mass index is 26.37 kg/m as calculated from the following:   Height as of this encounter: _0  (1.905 m).   Weight as of this encounter: 95.7 kg.   General Appearance: Alert Head: Normocephalic, without obvious abnormality, atraumatic Eyes: PERRL, conjunctiva/corneas clear, EOM's intact,    Ears: Normal  Throat: Normal  Neck: The middle range of motion, his anterior cervical incision is well-healed. Back: His lumbar incision has healed well. Lungs: Clear to auscultation bilaterally, respirations unlabored Heart: Regular rate and rhythm, no murmur, rub or gallop Abdomen: Soft, non-tender Extremities: Extremities normal, atraumatic, no cyanosis or edema Skin: unremarkable  NEUROLOGIC:   Mental status: alert and oriented,Motor Exam -right EHL weakness. Sensory Exam -L5 numbness bilaterally Reflexes:  Coordination - grossly normal Gait - grossly normal Balance - grossly normal Cranial Nerves: I: smell Not tested  II: visual acuity  OS: Normal  OD: Normal   II: visual fields Full to confrontation  II: pupils Equal, round, reactive to light  III,VII: ptosis None  III,IV,VI: extraocular muscles  Full ROM  V: mastication Normal  V: facial light touch sensation  Normal  V,VII: corneal reflex  Present  VII: facial muscle function - upper  Normal  VII: facial muscle function - lower Normal  VIII: hearing Not tested  IX: soft palate elevation  Normal  IX,X: gag reflex Present  XI: trapezius strength  5/5  XI:  sternocleidomastoid strength 5/5  XI: neck flexion strength  5/5  XII: tongue strength  Normal    Data Review Lab Results  Component Value Date   WBC 7.7 03/27/2019   HGB 16.1 03/27/2019   HCT 46.2 03/27/2019   MCV 90.1 03/27/2019   PLT 181 03/27/2019   Lab Results  Component Value Date   NA 135 03/27/2019   K 3.8 03/27/2019   CL 101 03/27/2019   CO2 22 03/27/2019   BUN 12 03/27/2019   CREATININE 1.17 03/27/2019   GLUCOSE 101 (H) 03/27/2019   No results found for: INR, PROTIME  Assessment/Plan: L5-S1 foraminal stenosis, lumbago, lumbar radiculopathy, neurogenic claudication: I have discussed the situation with the patient.  I have reviewed his  imaging studies with him and pointed out the abnormalities.  We have discussed the various treatment options including surgery.  I have described the surgical treatment option of an exploration of his lumbar fusion with an L5-S1 decompression, instrumentation and fusion.  I have shown him surgical models.  I have given him a surgical pamphlet.  We have discussed the risk, benefits, alternatives, expected postop course, and likelihood of achieving our goals with surgery.  I have answered all his questions.  He has decided to proceed with surgery.   Ophelia Charter 03/31/2019 7:26 AM

## 2019-03-31 NOTE — Transfer of Care (Signed)
Immediate Anesthesia Transfer of Care Note  Patient: [name]  Procedure(s) Performed: POSTERIOR LUMBAR INTERBODY FUSION, POSTERIOR INSTRUMENTATION LUMBAR FIVE- SACRAL ONE; EXPLORE FUSION (N/A Spine Lumbar)  Patient Location: PACU  Anesthesia Type:General  Level of Consciousness: drowsy and patient cooperative  Airway & Oxygen Therapy: Patient Spontanous Breathing and Patient connected to face mask oxygen  Post-op Assessment: Report given to RN and Post -op Vital signs reviewed and stable  Post vital signs: Reviewed and stable  Last Vitals:  Vitals Value Taken Time  BP 128/84 03/31/19 1126  Temp 36.4 C 03/31/19 1126  Pulse 82 03/31/19 1129  Resp 9 03/31/19 1129  SpO2 100 % 03/31/19 1129  Vitals shown include unvalidated device data.  Last Pain:  Vitals:   03/31/19 0647  PainSc: 8          Complications: No apparent anesthesia complications

## 2019-04-01 LAB — BASIC METABOLIC PANEL
Anion gap: 9 (ref 5–15)
BUN: 13 mg/dL (ref 8–23)
CO2: 28 mmol/L (ref 22–32)
Calcium: 8.9 mg/dL (ref 8.9–10.3)
Chloride: 101 mmol/L (ref 98–111)
Creatinine, Ser: 1.38 mg/dL — ABNORMAL HIGH (ref 0.61–1.24)
GFR calc Af Amer: 59 mL/min — ABNORMAL LOW (ref >60)
GFR calc non Af Amer: 51 mL/min — ABNORMAL LOW (ref >60)
Glucose, Bld: 120 mg/dL — ABNORMAL HIGH (ref 70–99)
Potassium: 4.2 mmol/L (ref 3.5–5.1)
Sodium: 138 mmol/L (ref 135–145)

## 2019-04-01 LAB — CBC
HCT: 40.9 % (ref 39.0–52.0)
Hemoglobin: 13.8 g/dL (ref 13.0–17.0)
MCH: 31.1 pg (ref 26.0–34.0)
MCHC: 33.7 g/dL (ref 30.0–36.0)
MCV: 92.1 fL (ref 80.0–100.0)
Platelets: 172 10*3/uL (ref 150–400)
RBC: 4.44 MIL/uL (ref 4.22–5.81)
RDW: 11.9 % (ref 11.5–15.5)
WBC: 11 10*3/uL — ABNORMAL HIGH (ref 4.0–10.5)
nRBC: 0 % (ref 0.0–0.2)

## 2019-04-01 MED ORDER — DOCUSATE SODIUM 100 MG PO CAPS: 100.0000 mg | ORAL_CAPSULE | Freq: Two times a day (BID) | ORAL | 0 refills | Status: DC

## 2019-04-01 MED ORDER — CYCLOBENZAPRINE HCL 10 MG PO TABS: 5.0000 mg | ORAL_TABLET | Freq: Three times a day (TID) | ORAL | 0 refills | Status: DC | PRN

## 2019-04-01 MED ORDER — ONDANSETRON HCL 4 MG PO TABS: 4.0000 mg | ORAL_TABLET | Freq: Four times a day (QID) | ORAL | 0 refills | Status: DC | PRN

## 2019-04-01 MED ORDER — CALCIUM CARBONATE ANTACID 500 MG PO CHEW
1.0000 | CHEWABLE_TABLET | Freq: Two times a day (BID) | ORAL | Status: DC | PRN
  Administered 2019-04-01: 200 mg via ORAL
  Filled 2019-04-01: qty 1

## 2019-04-01 MED ORDER — OXYCODONE HCL 5 MG PO TABS: 5.0000 mg | ORAL_TABLET | ORAL | 0 refills | Status: DC | PRN

## 2019-04-01 MED ORDER — ALUM & MAG HYDROXIDE-SIMETH 200-200-20 MG/5ML PO SUSP: 30.0000 mL | ORAL | Status: DC | PRN

## 2019-04-01 MED ORDER — DIPHENHYDRAMINE HCL 25 MG PO CAPS
25.0000 mg | ORAL_CAPSULE | Freq: Four times a day (QID) | ORAL | Status: DC | PRN
  Administered 2019-04-01: 25 mg via ORAL
  Filled 2019-04-01: qty 1

## 2019-04-01 NOTE — Evaluation (Signed)
Occupational Therapy Evaluation Patient Details Name: [name] MRN: [id number] DOB: 1945/11/21 Today's Date: 04/01/2019    History of Present Illness Pt is a 73 y/o male found with L5-S1 feaminal stenosis, S/P L5-S1 PLIF. PMH: arthritis, HTN, insomina, vertigo, lumbar fusion 2016, cervical fusion 2003.    Clinical Impression   PTA pt independent using rollator for mobility (due to pain) and LB ADLs with AE.  Admitted for above and limited by problem list below, including back precautions and pain. Patient educated on precautions, ADL compensatory techniques, DME, AE, safety and recommendations.  Pt able to complete LB ADLs with supervision using AE (which he normally uses at home--reacher, sock aide, long shoe horn and educated on long sponge), transfers with supervision, in room mobility with supervision, and grooming at sink with supervision. Donning brace with setup assist only.  He will have support of spouse at dc.  Based on performance today, no further OT needs identified. Thank you for this referral.      Follow Up Recommendations  No OT follow up;Supervision - Intermittent    Equipment Recommendations  None recommended by OT    Recommendations for Other Services       Precautions / Restrictions Precautions Precautions: Back Precaution Booklet Issued: Yes (comment) Precaution Comments: reviewed with pt  Required Braces or Orthoses: Spinal Brace Spinal Brace: Lumbar corset;Applied in sitting position Restrictions Weight Bearing Restrictions: No      Mobility Bed Mobility Overal bed mobility: Needs Assistance Bed Mobility: Rolling;Sidelying to Sit Rolling: Modified independent (Device/Increase time) Sidelying to sit: Modified independent (Device/Increase time)       General bed mobility comments: able to complete log roll technique without cueing   Transfers Overall transfer level: Needs assistance Equipment used: None Transfers: Sit to/from Stand Sit to  Stand: Supervision         General transfer comment: supervision for safety, good posture and technique    Balance Overall balance assessment: Mild deficits observed, not formally tested                                         ADL either performed or assessed with clinical judgement   ADL Overall ADL's : Needs assistance/impaired     Grooming: Supervision/safety;Standing   Upper Body Bathing: Supervision/ safety;Sitting   Lower Body Bathing: Supervison/ safety;Sit to/from stand Lower Body Bathing Details (indicate cue type and reason): educated on seated and using long sponge for bathing  Upper Body Dressing : Set up;Sitting Upper Body Dressing Details (indicate cue type and reason): able to manage brace without cueing  Lower Body Dressing: Supervision/safety;Set up;Sit to/from stand Lower Body Dressing Details (indicate cue type and reason): using AE for LB dressing, simulated; supervision sit to stand  Toilet Transfer: Supervision/safety;Ambulation Toilet Transfer Details (indicate cue type and reason): simulated in room         Functional mobility during ADLs: Supervision/safety General ADL Comments: pt educated on back precautions, safety, ADL compensatory techniques, DME and recommendations      Vision   Vision Assessment?: No apparent visual deficits     Perception     Praxis      Pertinent Vitals/Pain Pain Assessment: 0-10 Pain Score: 2  Pain Location: incisional, back Pain Descriptors / Indicators: Discomfort;Grimacing;Operative site guarding Pain Intervention(s): Monitored during session;Repositioned     Hand Dominance     Extremity/Trunk Assessment Upper Extremity Assessment Upper Extremity Assessment:  Overall [facility] for tasks assessed   Lower Extremity Assessment Lower Extremity Assessment: Defer to PT evaluation   Cervical / Trunk Assessment Cervical / Trunk Assessment: Other exceptions Cervical / Trunk Exceptions: s/p back  surgery   Communication Communication Communication: No difficulties   Cognition Arousal/Alertness: Awake/alert Behavior During Therapy: WFL for tasks assessed/performed Overall Cognitive Status: Within Functional Limits for tasks assessed                                     General Comments       Exercises     Shoulder Instructions      Home Living Family/patient expects to be discharged to:: Private residence Living Arrangements: Spouse/significant other Available Help at Discharge: Family;Available 24 hours/day Type of Home: House Home Access: Stairs to enter [employer] of Steps: 1   Home Layout: One level     Bathroom Shower/Tub: Occupational psychologist: Handicapped height     Home Equipment: Environmental consultant - 4 wheels;Shower seat - built Hotel manager: Reacher;Sock aid;Long-handled Conservation officer, historic buildings        Prior Functioning/Environment Level of Independence: Independent with assistive device(s)        Comments: was using rollator for moblity due to pain, but otherwise independent and using AE for LB dressing         OT Problem List: Decreased activity tolerance;Decreased safety awareness;Decreased knowledge of use of DME or AE;Decreased knowledge of precautions;Pain      OT Treatment/Interventions:      OT Goals(Current goals can be found in the care plan section) Acute Rehab OT Goals Patient Stated Goal: home today OT Goal Formulation: With patient  OT Frequency:     Barriers to D/C:            Co-evaluation              AM-PAC OT "6 Clicks" Daily Activity     Outcome Measure Help from another person eating meals?: None Help from another person taking care of personal grooming?: A Little Help from another person toileting, which includes using toliet, bedpan, or urinal?: A Little Help from another person bathing (including washing, rinsing, drying)?: A Little Help from another person to  put on and taking off regular upper body clothing?: None Help from another person to put on and taking off regular lower body clothing?: A Little 6 Click Score: 20   End of Session Equipment Utilized During Treatment: Back brace Nurse Communication: Mobility status  Activity Tolerance: Patient tolerated treatment well Patient left: with call bell/phone within reach;Other (comment)(seated EOB )  OT Visit Diagnosis: Pain Pain - part of body: (back- incisonal)                [id number] OT Time Calculation (min): 14 min Charges:  OT General Charges $OT Visit: 1 Visit OT Evaluation $OT Eval Low Complexity: 1 Low  Jolaine Artist, OT Acute Rehabilitation Services Pager [phone] Office [phone]   Delight Stare 04/01/2019, 9:06 AM

## 2019-04-01 NOTE — Evaluation (Signed)
Physical Therapy Evaluation Patient Details Name: [name] MRN: [id number] DOB: Sep 03, 1945 Today's Date: 04/01/2019   History of Present Illness  Pt is a 73 y/o male found with L5-S1 feaminal stenosis, S/P L5-S1 PLIF. PMH: arthritis, HTN, insomina, vertigo, lumbar fusion 2016, cervical fusion 2003.   Clinical Impression  Pt admitted with above diagnosis. At the time of PT eval, pt was able to demonstrate transfers and ambulation with gross supervision for safety and no AD. Pt reliant on railing in hall and noted UE tremors throughout session. Pt was educated on precautions, brace application/wearing schedule, appropriate activity progression, and car transfer. Pt currently with functional limitations due to the deficits listed below (see PT Problem List). Pt will benefit from skilled PT to increase their independence and safety with mobility to allow discharge to the venue listed below.         Follow Up Recommendations No PT follow up;Supervision for mobility/OOB    Equipment Recommendations  None recommended by PT    Recommendations for Other Services       Precautions / Restrictions Precautions Precautions: Back Precaution Booklet Issued: Yes (comment) Precaution Comments: reviewed with pt  Required Braces or Orthoses: Spinal Brace Spinal Brace: Lumbar corset;Applied in sitting position Restrictions Weight Bearing Restrictions: No      Mobility  Bed Mobility               General bed mobility comments: Pt was received ambulating in hallway.  Transfers Overall transfer level: Needs assistance Equipment used: None Transfers: Sit to/from Stand Sit to Stand: Supervision         General transfer comment: Light supervision for safety, good posture and technique  Ambulation/Gait Ambulation/Gait assistance: Supervision Gait Distance (Feet): 400 Feet Assistive device: None Gait Pattern/deviations: Shuffle;Trunk flexed;Narrow base of support Gait velocity:  Decreased Gait velocity interpretation: <1.8 ft/sec, indicate of risk for recurrent falls General Gait Details: Grossly flexed trunk with minor corrective changes when cued for improved posture. Ambulating fairly well however noting a shuffling gait pattern and narrow BOS.   Stairs Stairs: Yes Stairs assistance: Supervision;Min guard Stair Management: One rail Right;Alternating pattern;Forwards Number of Stairs: 10 General stair comments: VC's for sequencing and general safety. Pt managed a flight of stairs well without assistance.   Wheelchair Mobility    Modified Rankin (Stroke Patients Only)       Balance Overall balance assessment: Mild deficits observed, not formally tested                                           Pertinent Vitals/Pain Pain Assessment: Faces Faces Pain Scale: Hurts a little bit Pain Location: incisional, back Pain Descriptors / Indicators: Discomfort;Grimacing;Operative site guarding Pain Intervention(s): Limited activity within patient's tolerance;Monitored during session;Repositioned    Home Living Family/patient expects to be discharged to:: Private residence Living Arrangements: Spouse/significant other Available Help at Discharge: Family;Available 24 hours/day Type of Home: House Home Access: Stairs to enter   [employer] of Steps: 1 Home Layout: One level Home Equipment: Walker - 4 wheels;Shower seat - built Investment banker, operational      Prior Function Level of Independence: Independent with assistive device(s)         Comments: was using rollator for moblity due to pain, but otherwise independent and using AE for LB dressing      Hand Dominance        Extremity/Trunk Assessment  Upper Extremity Assessment Upper Extremity Assessment: RUE deficits/detail RUE Deficits / Details: Noted UE tremors bilaterally, R>L, however pt reports that he was told he has Parkinson's but does not feel this is accurate.  Parkinson's Disease not listed in PMH.     Lower Extremity Assessment Lower Extremity Assessment: Generalized weakness(Consistent with pre-op diagnosis)    Cervical / Trunk Assessment Cervical / Trunk Assessment: Other exceptions Cervical / Trunk Exceptions: s/p back surgery  Communication   Communication: No difficulties  Cognition Arousal/Alertness: Awake/alert Behavior During Therapy: WFL for tasks assessed/performed Overall Cognitive Status: Within Functional Limits for tasks assessed                                        General Comments      Exercises     Assessment/Plan    PT Assessment Patient needs continued PT services  PT Problem List Decreased strength;Decreased activity tolerance;Decreased balance;Decreased mobility;Decreased knowledge of use of DME;Decreased safety awareness;Decreased knowledge of precautions;Pain       PT Treatment Interventions DME instruction;Gait training;Functional mobility training;Therapeutic activities;Therapeutic exercise;Neuromuscular re-education;Patient/family education    PT Goals (Current goals can be found in the Care Plan section)  Acute Rehab PT Goals Patient Stated Goal: home today PT Goal Formulation: With patient Time For Goal Achievement: 04/08/19 Potential to Achieve Goals: Good    Frequency Min 5X/week   Barriers to discharge        Co-evaluation               AM-PAC PT "6 Clicks" Mobility  Outcome Measure Help needed turning from your back to your side while in a flat bed without using bedrails?: None Help needed moving from lying on your back to sitting on the side of a flat bed without using bedrails?: None Help needed moving to and from a bed to a chair (including a wheelchair)?: None Help needed standing up from a chair using your arms (e.g., wheelchair or bedside chair)?: None Help needed to walk in hospital room?: A Little Help needed climbing 3-5 steps with a railing? : A  Little 6 Click Score: 22    End of Session Equipment Utilized During Treatment: Back brace Activity Tolerance: Patient tolerated treatment well Patient left: in chair;with call bell/phone within reach Nurse Communication: Mobility status PT Visit Diagnosis: Unsteadiness on feet (R26.81);Pain Pain - part of body: (back)    Time: [id number] PT Time Calculation (min) (ACUTE ONLY): 16 min   Charges:   PT Evaluation $PT Eval Moderate Complexity: 1 Mod          Rolinda Roan, PT, DPT Acute Rehabilitation Services Pager: [phone] Office: [phone]   Thelma Comp 04/01/2019, 1:32 PM

## 2019-04-01 NOTE — Plan of Care (Signed)
Patient alert and oriented, mae's well, voiding adequate amount of urine, swallowing without difficulty, no c/o pain at time of discharge. Patient discharged home with family. Script and discharged instructions given to patient. Patient and family stated understanding of instructions given. Patient has an appointment with Dr. Jenkins   

## 2019-04-01 NOTE — Discharge Summary (Signed)
Physician Discharge Summary  Patient ID: [name] MRN: [id number] DOB/AGE: Sep 13, 1945 73 y.o.  Admit date: 03/31/2019 Discharge date: 04/01/2019  Admission Diagnoses: L5-S1 far lateral herniated disc, lumbosacral foraminal stenosis, lumbosacral radiculopathy, neurogenic claudication Discharge Diagnoses: The same Active Problems:   Neural foraminal stenosis of lumbosacral spine   Discharged Condition: good  Hospital Course: I performed an exploration of the patient's lumbar fusion with an L5-S1 decompression, instrumentation and fusion, redo L4-5 fusion on 03/31/2019.  The surgery went well.  The patient's postoperative course was unremarkable.  On postoperative day #1 he looked and felt much better.  His radicular pain was gone.  He requested discharge to home.  He was given written and oral discharge instructions.  All his questions were answered.  Consults: PT, OT, care management Significant Diagnostic Studies: None Treatments: Exploration of lumbar fusion, L5-S1 decompression, instrumentation and fusion, redo L4-5 posterior lateral arthrodesis Discharge Exam: Blood pressure 127/85, pulse 76, temperature 98.5 F (36.9 C), temperature source Oral, resp. rate 20, height 6\' 3"  (1.905 m), weight 95.7 kg, SpO2 97 %. The patient is alert and pleasant.  His dressing is clean and dry.  His strength is normal.  He looks well.  Disposition: Home  Discharge Instructions    Call MD for:  difficulty breathing, headache or visual disturbances   Complete by: As directed    Call MD for:  extreme fatigue   Complete by: As directed    Call MD for:  hives   Complete by: As directed    Call MD for:  persistant dizziness or light-headedness   Complete by: As directed    Call MD for:  persistant nausea and vomiting   Complete by: As directed    Call MD for:  redness, tenderness, or signs of infection (pain, swelling, redness, odor or green/yellow discharge around incision site)    Complete by: As directed    Call MD for:  severe uncontrolled pain   Complete by: As directed    Call MD for:  temperature >100.4   Complete by: As directed    Diet - low sodium heart healthy   Complete by: As directed    Discharge instructions   Complete by: As directed    Call [phone] for a followup appointment. Take a stool softener while you are using pain medications.   Driving Restrictions   Complete by: As directed    Do not drive for 2 weeks.   Increase activity slowly   Complete by: As directed    Lifting restrictions   Complete by: As directed    Do not lift more than 5 pounds. No excessive bending or twisting.   May shower / Bathe   Complete by: As directed    Remove the dressing for 3 days after surgery.  You may shower, but leave the incision alone.   Remove dressing in 48 hours   Complete by: As directed    Your stitches are under the scan and will dissolve by themselves. The Steri-Strips will fall off after you take a few showers. Do not rub back or pick at the wound, Leave the wound alone.     Allergies as of 04/01/2019      Reactions   Flomax [tamsulosin Hcl] Other (See Comments)   Lack of effect, not allergy or intolerance.    Morphine And Related Hives   Hallucinations- can tolerate oxycodone.       Medication List    STOP taking these medications  acetaminophen 500 MG tablet Commonly known as: TYLENOL   carbidopa-levodopa 25-100 MG tablet Commonly known as: SINEMET IR     TAKE these medications   amLODipine 5 MG tablet Commonly known as: NORVASC Take 5 mg by mouth as needed.   benzonatate 200 MG capsule Commonly known as: TESSALON Take 1 capsule (200 mg total) by mouth 3 (three) times daily as needed.   BIOFREEZE EX Apply 1 application topically daily as needed (pain).   cyclobenzaprine 10 MG tablet Commonly known as: FLEXERIL Take 0.5-1 tablets (5-10 mg total) by mouth 3 (three) times daily as needed for muscle spasms.   docusate  sodium 100 MG capsule Commonly known as: COLACE Take 1 capsule (100 mg total) by mouth 2 (two) times daily.   Echinacea 450 MG Caps Take 450 mg by mouth daily.   loratadine 10 MG tablet Commonly known as: CLARITIN Take 10 mg by mouth daily.   multivitamin with minerals Tabs tablet Take 1 tablet by mouth daily.   oxyCODONE 5 MG immediate release tablet Commonly known as: Oxy IR/ROXICODONE Take 1 tablet (5 mg total) by mouth every 3 (three) hours as needed for moderate pain ((score 4 to 6)).   traMADol 50 MG tablet Commonly known as: ULTRAM Take 50 mg by mouth every 6 (six) hours as needed for moderate pain.   Vitamin D 125 MCG (5000 UT) Caps Take 5,000 Units by mouth daily.   zinc gluconate 50 MG tablet Take 50 mg by mouth daily.        Signed: Ophelia Charter 04/01/2019, 7:40 AM

## 2019-04-03 MED FILL — Sodium Chloride IV Soln 0.9%: INTRAVENOUS | Qty: 3000 | Status: AC

## 2019-04-03 MED FILL — Heparin Sodium (Porcine) Inj 1000 Unit/ML: INTRAMUSCULAR | Qty: 30 | Status: AC

## 2019-04-22 DIAGNOSIS — M4316 Spondylolisthesis, lumbar region: Secondary | ICD-10-CM | POA: Diagnosis not present

## 2019-04-22 DIAGNOSIS — Z6825 Body mass index (BMI) 25.0-25.9, adult: Secondary | ICD-10-CM | POA: Insufficient documentation

## 2019-04-24 ENCOUNTER — Other Ambulatory Visit: Payer: Self-pay | Admitting: *Deleted

## 2019-04-24 ENCOUNTER — Inpatient Hospital Stay (HOSPITAL_COMMUNITY)
Admission: EM | Admit: 2019-04-24 | Discharge: 2019-04-25 | DRG: 310 | Disposition: A | Discharge status: Home / Self Care | Payer: PPO | Attending: Internal Medicine | Admitting: Internal Medicine

## 2019-04-24 ENCOUNTER — Emergency Department (HOSPITAL_COMMUNITY): Payer: PPO

## 2019-04-24 ENCOUNTER — Telehealth: Payer: Self-pay | Admitting: Family Medicine

## 2019-04-24 ENCOUNTER — Encounter (HOSPITAL_COMMUNITY): Payer: Self-pay | Admitting: Nephrology

## 2019-04-24 ENCOUNTER — Telehealth: Payer: Self-pay

## 2019-04-24 DIAGNOSIS — K219 Gastro-esophageal reflux disease without esophagitis: Secondary | ICD-10-CM | POA: Diagnosis not present

## 2019-04-24 DIAGNOSIS — Z9889 Other specified postprocedural states: Secondary | ICD-10-CM | POA: Diagnosis not present

## 2019-04-24 DIAGNOSIS — G4700 Insomnia, unspecified: Secondary | ICD-10-CM | POA: Diagnosis present

## 2019-04-24 DIAGNOSIS — Z8249 Family history of ischemic heart disease and other diseases of the circulatory system: Secondary | ICD-10-CM | POA: Diagnosis not present

## 2019-04-24 DIAGNOSIS — I4891 Unspecified atrial fibrillation: Secondary | ICD-10-CM | POA: Diagnosis present

## 2019-04-24 DIAGNOSIS — I480 Paroxysmal atrial fibrillation: Principal | ICD-10-CM | POA: Diagnosis present

## 2019-04-24 DIAGNOSIS — Z885 Allergy status to narcotic agent status: Secondary | ICD-10-CM

## 2019-04-24 DIAGNOSIS — Z79891 Long term (current) use of opiate analgesic: Secondary | ICD-10-CM | POA: Diagnosis not present

## 2019-04-24 DIAGNOSIS — Z833 Family history of diabetes mellitus: Secondary | ICD-10-CM | POA: Diagnosis not present

## 2019-04-24 DIAGNOSIS — Z951 Presence of aortocoronary bypass graft: Secondary | ICD-10-CM

## 2019-04-24 DIAGNOSIS — I484 Atypical atrial flutter: Secondary | ICD-10-CM | POA: Diagnosis not present

## 2019-04-24 DIAGNOSIS — Z20822 Contact with and (suspected) exposure to covid-19: Secondary | ICD-10-CM | POA: Diagnosis present

## 2019-04-24 DIAGNOSIS — R079 Chest pain, unspecified: Secondary | ICD-10-CM | POA: Diagnosis not present

## 2019-04-24 DIAGNOSIS — Z888 Allergy status to other drugs, medicaments and biological substances status: Secondary | ICD-10-CM | POA: Diagnosis not present

## 2019-04-24 DIAGNOSIS — R000 Tachycardia, unspecified: Secondary | ICD-10-CM | POA: Diagnosis not present

## 2019-04-24 DIAGNOSIS — F0280 Dementia in other diseases classified elsewhere without behavioral disturbance: Secondary | ICD-10-CM | POA: Diagnosis not present

## 2019-04-24 DIAGNOSIS — Z03818 Encounter for observation for suspected exposure to other biological agents ruled out: Secondary | ICD-10-CM | POA: Diagnosis not present

## 2019-04-24 DIAGNOSIS — Z79899 Other long term (current) drug therapy: Secondary | ICD-10-CM | POA: Diagnosis not present

## 2019-04-24 DIAGNOSIS — Z794 Long term (current) use of insulin: Secondary | ICD-10-CM

## 2019-04-24 DIAGNOSIS — Z91018 Allergy to other foods: Secondary | ICD-10-CM

## 2019-04-24 DIAGNOSIS — Z981 Arthrodesis status: Secondary | ICD-10-CM

## 2019-04-24 DIAGNOSIS — E119 Type 2 diabetes mellitus without complications: Secondary | ICD-10-CM | POA: Diagnosis present

## 2019-04-24 DIAGNOSIS — G20 Parkinson's disease: Secondary | ICD-10-CM | POA: Diagnosis present

## 2019-04-24 DIAGNOSIS — I4510 Unspecified right bundle-branch block: Secondary | ICD-10-CM | POA: Diagnosis present

## 2019-04-24 DIAGNOSIS — Z887 Allergy status to serum and vaccine status: Secondary | ICD-10-CM | POA: Diagnosis not present

## 2019-04-24 DIAGNOSIS — I10 Essential (primary) hypertension: Secondary | ICD-10-CM | POA: Diagnosis present

## 2019-04-24 DIAGNOSIS — G4733 Obstructive sleep apnea (adult) (pediatric): Secondary | ICD-10-CM | POA: Diagnosis present

## 2019-04-24 DIAGNOSIS — G8929 Other chronic pain: Secondary | ICD-10-CM | POA: Diagnosis not present

## 2019-04-24 DIAGNOSIS — Z91040 Latex allergy status: Secondary | ICD-10-CM

## 2019-04-24 LAB — CBC
HCT: 44.7 % (ref 39.0–52.0)
Hemoglobin: 15.2 g/dL (ref 13.0–17.0)
MCH: 30.5 pg (ref 26.0–34.0)
MCHC: 34 g/dL (ref 30.0–36.0)
MCV: 89.6 fL (ref 80.0–100.0)
Platelets: 251 10*3/uL (ref 150–400)
RBC: 4.99 MIL/uL (ref 4.22–5.81)
RDW: 12.2 % (ref 11.5–15.5)
WBC: 9.9 10*3/uL (ref 4.0–10.5)
nRBC: 0 % (ref 0.0–0.2)

## 2019-04-24 LAB — BASIC METABOLIC PANEL
Anion gap: 13 (ref 5–15)
BUN: 10 mg/dL (ref 8–23)
CO2: 20 mmol/L — ABNORMAL LOW (ref 22–32)
Calcium: 9.8 mg/dL (ref 8.9–10.3)
Chloride: 100 mmol/L (ref 98–111)
Creatinine, Ser: 1.23 mg/dL (ref 0.61–1.24)
GFR calc Af Amer: >60 mL/min (ref >60)
GFR calc non Af Amer: 58 mL/min — ABNORMAL LOW (ref >60)
Glucose, Bld: 130 mg/dL — ABNORMAL HIGH (ref 70–99)
Potassium: 4 mmol/L (ref 3.5–5.1)
Sodium: 133 mmol/L — ABNORMAL LOW (ref 135–145)

## 2019-04-24 LAB — SARS CORONAVIRUS 2 (TAT 6-24 HRS): SARS Coronavirus 2: NEGATIVE

## 2019-04-24 LAB — TROPONIN I (HIGH SENSITIVITY)
Troponin I (High Sensitivity): 5 ng/L (ref <18)
Troponin I (High Sensitivity): 4 ng/L (ref <18)

## 2019-04-24 MED ORDER — SENNOSIDES-DOCUSATE SODIUM 8.6-50 MG PO TABS
2.0000 | ORAL_TABLET | Freq: Every day | ORAL | Status: DC
  Administered 2019-04-24: 22:00:00 2 via ORAL
  Filled 2019-04-24: qty 2

## 2019-04-24 MED ORDER — ACETAMINOPHEN 325 MG PO TABS
650.0000 mg | ORAL_TABLET | Freq: Four times a day (QID) | ORAL | Status: DC | PRN
  Administered 2019-04-25: 650 mg via ORAL
  Filled 2019-04-24: qty 2

## 2019-04-24 MED ORDER — DILTIAZEM HCL 25 MG/5ML IV SOLN: 10.0000 mg | Freq: Once | INTRAVENOUS | Status: DC

## 2019-04-24 MED ORDER — ONDANSETRON HCL 4 MG/2ML IJ SOLN: 4.0000 mg | Freq: Four times a day (QID) | INTRAMUSCULAR | Status: DC | PRN

## 2019-04-24 MED ORDER — SODIUM CHLORIDE 0.9% FLUSH
3.0000 mL | Freq: Two times a day (BID) | INTRAVENOUS | Status: DC
  Administered 2019-04-25: 3 mL via INTRAVENOUS

## 2019-04-24 MED ORDER — HYDROMORPHONE HCL 1 MG/ML IJ SOLN
1.0000 mg | Freq: Once | INTRAMUSCULAR | Status: AC
  Administered 2019-04-24: 1 mg via INTRAVENOUS
  Filled 2019-04-24: qty 1

## 2019-04-24 MED ORDER — ACETAMINOPHEN 650 MG RE SUPP: 650.0000 mg | Freq: Four times a day (QID) | RECTAL | Status: DC | PRN

## 2019-04-24 MED ORDER — RAMELTEON 8 MG PO TABS
8.0000 mg | ORAL_TABLET | Freq: Once | ORAL | Status: AC
  Administered 2019-04-24: 8 mg via ORAL
  Filled 2019-04-24: qty 1

## 2019-04-24 MED ORDER — DILTIAZEM HCL-DEXTROSE 125-5 MG/125ML-% IV SOLN (PREMIX)
5.0000 mg/h | INTRAVENOUS | Status: DC
  Administered 2019-04-24: 5 mg/h via INTRAVENOUS
  Filled 2019-04-24: qty 125

## 2019-04-24 MED ORDER — BISACODYL 5 MG PO TBEC: 5.0000 mg | DELAYED_RELEASE_TABLET | Freq: Every day | ORAL | Status: DC | PRN

## 2019-04-24 MED ORDER — SODIUM CHLORIDE 0.9 % IV SOLN: INTRAVENOUS | Status: DC

## 2019-04-24 MED ORDER — SODIUM CHLORIDE 0.9% FLUSH: 3.0000 mL | Freq: Once | INTRAVENOUS | Status: DC

## 2019-04-24 MED ORDER — AMLODIPINE BESYLATE 5 MG PO TABS: 5.0000 mg | ORAL_TABLET | ORAL | 0 refills | Status: DC | PRN

## 2019-04-24 MED ORDER — PROPRANOLOL HCL ER 60 MG PO CP24
120.0000 mg | ORAL_CAPSULE | Freq: Every day | ORAL | Status: DC
  Administered 2019-04-24: 120 mg via ORAL
  Filled 2019-04-24: qty 1
  Filled 2019-04-24 (×2): qty 2

## 2019-04-24 MED ORDER — HYDROCODONE-ACETAMINOPHEN 5-325 MG PO TABS
1.0000 | ORAL_TABLET | ORAL | Status: DC | PRN
  Administered 2019-04-25: 2 via ORAL
  Filled 2019-04-24: qty 2

## 2019-04-24 MED ORDER — RIVAROXABAN 20 MG PO TABS
20.0000 mg | ORAL_TABLET | Freq: Every day | ORAL | Status: DC
  Administered 2019-04-24 – 2019-04-25 (×2): 20 mg via ORAL
  Filled 2019-04-24 (×2): qty 1

## 2019-04-24 MED ORDER — SODIUM CHLORIDE 0.9 % IV SOLN: 250.0000 mL | INTRAVENOUS | Status: DC | PRN

## 2019-04-24 MED ORDER — ONDANSETRON HCL 4 MG PO TABS: 4.0000 mg | ORAL_TABLET | Freq: Four times a day (QID) | ORAL | Status: DC | PRN

## 2019-04-24 MED ORDER — SODIUM CHLORIDE 0.9% FLUSH: 3.0000 mL | INTRAVENOUS | Status: DC | PRN

## 2019-04-24 NOTE — Consult Note (Signed)
CARDIOLOGY CONSULT NOTE  Patient ID: [name] MRN: [id number] DOB/AGE: May 31, 1945 74 y.o.  Admit date: 04/24/2019 Referring Physician  Roney Jaffe, MD Primary Physician:  Tonia Ghent, MD Reason for Consultation  New A. Fib  Patient ID: [name], male    DOB: 1945-08-04, 74 y.o.   MRN: [id number]  Chief Complaint  Patient presents with  . Tachycardia   HPI:    [name]  is a 74 y.o. Caucasian male with no significant prior cardiovascular history except for hypertension, recent back surgery in December 2020 without perioperative cardiac complications, OSA on CPAP, recent diagnosis of Parkinson's disease presented to the emergency room with fatigue and not feeling well.  He was found to be in atrial fibrillation with rapid ventricular response, was started on IV diltiazem and I was consulted to manage the rhythm disorder.  Since being in the hospital, states that he is feeling well and symptoms of palpitations and fatigue seem to have improved.  Also states that he was having burning sensation in the urine and was having difficulty micturating, but states that since being in the hospital he has been urinating a lot.  Denies any fever, chills.  No history of GI bleed.  Past Medical History:  Diagnosis Date  . Arthritis   . GERD (gastroesophageal reflux disease)   . H/O urinary frequency   . Heart murmur    as a child  . History of bronchitis   . HSV infection    oral  . Hypertension    improved after back pain treated with surgery  . Insomnia   . Joint pain   . OSA on CPAP    he ordered kit and started use 2017 w/o formal testing, see note from 11/30/15.   . Vertigo 10/13/2014   recurrent; trigger is cool air on left ear, improved with daily antihistamine   Past Surgical History:  Procedure Laterality Date  . ACHILLES TENDON REPAIR Left   . BACK SURGERY    . COLONOSCOPY  02/2011   Negative,[facility]  . DEBRIDEMENT AND CLOSURE WOUND Left  02/25/2018   Procedure: DEBRIDEMENT LEFT LEG APPLICATION INTEGRA;  Surgeon: Irene Limbo, MD;  Location: [facility];  Service: Plastics;  Laterality: Left;  . EYE SURGERY Bilateral    cataracts  . KNEE ARTHROSCOPY     L  . LUMBAR FUSION     2016  . SPINE SURGERY  2003   cervical fusion  . TONSILLECTOMY    . varicoelectomy     Social History   Socioeconomic History  . Marital status: Married    Spouse name: Not on file  . Number of children: 2  . Years of education: 88  . Highest education level: Not on file  Occupational History  . Occupation: retired  Tobacco Use  . Smoking status: Never Smoker  . Smokeless tobacco: Never Used  Substance and Sexual Activity  . Alcohol use: No  . Drug use: No  . Sexual activity: Not on file  Other Topics Concern  . 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     Social Determinants of Health   Financial Resource Strain:   . Difficulty of Paying Living  Expenses: Not on file  Food Insecurity:   . Worried About Charity fundraiser in the Last Year: Not on file  . Ran Out of Food in the Last Year: Not on file  Transportation Needs:   . Lack of Transportation (Medical): Not on file  . Lack of Transportation (Non-Medical): Not on file  Physical Activity:   . Days of Exercise per Week: Not on file  . Minutes of Exercise per Session: Not on file  Stress:   . Feeling of Stress : Not on file  Social Connections:   . Frequency of Communication with Friends and Family: Not on file  . Frequency of Social Gatherings with Friends and Family: Not on file  . Attends Religious Services: Not on file  . Active Member of Clubs or Organizations: Not on file  . Attends Archivist  Meetings: Not on file  . Marital Status: Not on file  Intimate Partner Violence:   . Fear of Current or Ex-Partner: Not on file  . Emotionally Abused: Not on file  . Physically Abused: Not on file  . Sexually Abused: Not on file   ROS  Review of Systems  Constitution: Positive for malaise/fatigue. Negative for weight gain.  Cardiovascular: Positive for dyspnea on exertion and irregular heartbeat. Negative for leg swelling and syncope.  Respiratory: Negative for hemoptysis.   Endocrine: Negative for cold intolerance.  Hematologic/Lymphatic: Does not bruise/bleed easily.  Musculoskeletal: Positive for back pain.  Gastrointestinal: Negative for heartburn, hematochezia and melena.  Neurological: Negative for headaches and light-headedness.  All other systems reviewed and are negative.  Objective   Vitals with BMI 04/24/2019 04/24/2019 04/24/2019  Height - - -  Weight - - -  BMI - - -  Systolic 784 696 295  Diastolic 99 284 97  Pulse 109 98 128    Blood pressure (!) 158/99, pulse (!) 109, temperature 98.2 F (36.8 C), temperature source Oral, resp. rate 13, SpO2 97 %.    Physical Exam  Constitutional: He appears well-developed and well-nourished.  HENT:  Head: Atraumatic.  Eyes: Conjunctivae are normal.  Neck: No thyromegaly present.  Cardiovascular: Intact distal pulses and normal pulses. An irregularly irregular rhythm present. Tachycardia present. Exam reveals no gallop, no S3 and no S4.  No murmur heard. S1 is variable, S2 is normal. No JVD, No leg edema.  Pulmonary/Chest: Effort normal and breath sounds normal.  Abdominal: Soft. Bowel sounds are normal.  Skin: Skin is warm and dry.   Laboratory examination:   Recent Labs    03/27/19 1113 04/01/19 0543 04/24/19 1453  NA 135 138 133*  K 3.8 4.2 4.0  CL 101 101 100  CO2 22 28 20*  GLUCOSE 101* 120* 130*  BUN _0 CREATININE 1.17 1.38* 1.23  CALCIUM 9.1 8.9 9.8  GFRNONAA >60 51* 58*  GFRAA >60 59* >60    CrCl cannot be calculated (Unknown ideal weight.).  CMP Latest Ref Rng & Units 04/24/2019 04/01/2019 03/27/2019  Glucose 70 - 99 mg/dL 130(H) 120(H) 101(H)  BUN 8 - 23 mg/dL _1 Creatinine 0.61 - 1.24 mg/dL 1.23 1.38(H) 1.17  Sodium 135 - 145 mmol/L 133(L) 138 135  Potassium 3.5 - 5.1 mmol/L 4.0 4.2 3.8  Chloride 98 - 111 mmol/L 100 101 101  CO2 22 - 32 mmol/L 20(L) 28 22  Calcium 8.9 - 10.3 mg/dL 9.8 8.9 9.1  Total Protein 6.1 - 8.1 g/dL - - -  Total Bilirubin 0.2 -  1.2 mg/dL - - -  Alkaline Phos 39 - 117 U/L - - -  AST 10 - 35 U/L - - -  ALT 9 - 46 U/L - - -   CBC Latest Ref Rng & Units 04/24/2019 04/01/2019 03/27/2019  WBC 4.0 - 10.5 K/uL 9.9 11.0(H) 7.7  Hemoglobin 13.0 - 17.0 g/dL 15.2 13.8 16.1  Hematocrit 39.0 - 52.0 % 44.7 40.9 46.2  Platelets 150 - 400 K/uL 251 172 181   Lipid Panel     Component Value Date/Time   CHOL 132 04/11/2017 0936   TRIG 111.0 04/11/2017 0936   HDL 30.40 (L) 04/11/2017 0936   CHOLHDL 4 04/11/2017 0936   VLDL 22.2 04/11/2017 0936   LDLCALC 79 04/11/2017 0936   HEMOGLOBIN A1C No results found for: HGBA1C, MPG TSH Recent Labs    01/14/19 0958  TSH 4.39   BNP (last 3 results) No results for input(s): BNP in the last 8760 hours.  Medications and allergies   Allergies  Allergen Reactions  . Flomax [Tamsulosin Hcl] Other (See Comments)    Lack of effect, not allergy or intolerance.   . Morphine And Related Hives    Hallucinations- can tolerate oxycodone.      . sodium chloride 75 mL/hr at 04/24/19 1534  . sodium chloride    . diltiazem (CARDIZEM) infusion 5 mg/hr (04/24/19 1705)    Current Outpatient Medications  Medication Instructions  . amLODipine (NORVASC) 5 mg, Oral, As needed, Please ask patient to make an appointment for physical exam prior to further refills.  . cyclobenzaprine (FLEXERIL) 5-10 mg, Oral, 3 times daily PRN  . docusate sodium (COLACE) 100 mg, Oral, 2 times daily  . Echinacea 450 mg, Oral, Daily  .  loratadine (CLARITIN) 10 mg, Oral, Daily  . Menthol, Topical Analgesic, (BIOFREEZE EX) 1 application, Topical, Daily PRN  . Multiple Vitamin (MULTIVITAMIN WITH MINERALS) TABS tablet 1 tablet, Oral, Daily  . ondansetron (ZOFRAN) 4 mg, Oral, Every 6 hours PRN  . oxyCODONE (OXY IR/ROXICODONE) 5 mg, Oral, Every  3 hours PRN  . traMADol (ULTRAM) 50 mg, Oral, Every 6 hours PRN  . Vitamin D 5,000 Units, Oral, Daily  . zinc gluconate 50 mg, Oral, Daily   Scheduled Meds: . diltiazem  10 mg Intravenous Once  . senna-docusate  2 tablet Oral QHS  . sodium chloride flush  3 mL Intravenous Once  . sodium chloride flush  3 mL Intravenous Q12H   Continuous Infusions: . sodium chloride 75 mL/hr at 04/24/19 1534  . sodium chloride    . diltiazem (CARDIZEM) infusion 5 mg/hr (04/24/19 1705)   PRN Meds:.sodium chloride, acetaminophen **OR** acetaminophen, bisacodyl, HYDROcodone-acetaminophen, ondansetron **OR** ondansetron (ZOFRAN) IV, sodium chloride flush   No intake/output data recorded. Total I/O In: -  Out: 800 [Urine:800]   Radiology:   Imaging: DG Chest Port 1 View  Result Date: 04/24/2019 CLINICAL DATA:  Chest pain.  Palpitations. EXAM: PORTABLE CHEST 1 VIEW COMPARISON:  12/20/2014. FINDINGS: Mediastinum and hilar structures normal. Heart size normal. Lungs are clear. No pleural effusion or pneumothorax. Prior cervical spine fusion. Degenerative change thoracic spine. IMPRESSION: No acute cardiopulmonary disease. Electronically Signed   By: Marcello Moores  Register   On: 04/24/2019 15:43   Cardiac Studies:   None  Assessment   1.  New onset atypical atrial flutter with 2: 1 conduction with RVR, ventricular rate 148 bpm, episodes of paroxysmal atrial fibrillation. CHA2DS2-VASc Score is 3.  Yearly risk of stroke: 3.2%(Age, HTN)  EKG 04/24/2019: Atrial  flutter with 2: 1 conduction at the rate of 147 bpm.  Rightward axis.  Right bundle branch block.  No evidence of ischemia.  Compared to the study  done on 03/27/2019, sinus rhythm is now replaced by atrial flutter.  Telemetry: Paroxysmal episodes of atrial fibrillation with RVR.  Patient flips back into a flutter.           2.  Essential hypertension  Recommendations:   [name]  is a 74 y.o.  Caucasian male with no significant prior cardiovascular history except for hypertension, recent back surgery in December 2020 without perioperative cardiac complications, OSA on CPAP, recent diagnosis of Parkinson's disease presented to the emergency room with fatigue and not feeling well.  He was found to be in atrial fibrillation with rapid ventricular response, was started on IV diltiazem and I was consulted to manage the rhythm disorder.  With IV diltiazem is trying to convert back to sinus rhythm.  Continue IV diltiazem for now, I would like to try propranolol LA 120 mg daily to see whether we can control his blood pressure better and also hopefully convert him back to sinus rhythm or improve heart rate control.  I am using propranolol in view of recent diagnosis of Parkinson's disease and also tremors, to see if this would help with tremors in any which way.  If he has any side effects from the propranolol, we could certainly switch him back to diltiazem in the outpatient basis.  Agree with starting him on Xarelto, his cardioembolic risk is 3.0.  We will follow up on the echocardiogram.  If he remains stable and his heart rate is controlled, could potentially be discharged in the morning with outpatient follow-up, in view of Covid 19 we will try to hasten his discharge.  Thank you for the consultation.  Adrian Prows, MD, [facility] 04/24/2019, 6:33 PM [facility] [place] Pager: [phone] Office: [phone]

## 2019-04-24 NOTE — ED Provider Notes (Signed)
Captured EKG tracings per request of DR. Joaquin Music, PA-C 04/24/19 Valerie Roys    Daleen Bo, MD 04/28/19 [phone]

## 2019-04-24 NOTE — ED Notes (Signed)
Cardizem 25mg  bolus given per MD

## 2019-04-24 NOTE — ED Notes (Signed)
Attempted report 

## 2019-04-24 NOTE — Telephone Encounter (Signed)
Rx sent.  Letter sent to make appointment for CPE.

## 2019-04-24 NOTE — ED Provider Notes (Signed)
[facility] EMERGENCY DEPARTMENT Provider Note   CSN: [id number] Arrival date & time: 04/24/19  1422     History Chief Complaint  Patient presents with  . Tachycardia    [name] is a 74 y.o. male.  Patient arrived by POV. Feeling lightheaded feeling as if maybe he is got a pass out. He was noted out in triage to have a tachycardia with a heart rate of 150 bpm. No fever. Respirations 16 blood pressure elevated 172/113. Patient checked his blood pressure at home and it was markedly elevated that was his concern. He could not tell that his heart was going fast. Patient does have a history of hypertension is on Norvasc. Patient is not on any blood thinners. But he is on chronic pain medication oxycodone. And has stopped that 2 days ago. On his own. Patient is had a history of atrial fibrillation one time in the past was not on any medications for it it was after a postoperative procedure. Patient recently had back surgery done by Dr. Arnoldo Morale but that was week or so ago. Patient also has a history of Parkinson's disease. Although not listed on his past medical history.        Past Medical History:  Diagnosis Date  . Arthritis   . GERD (gastroesophageal reflux disease)   . H/O urinary frequency   . Heart murmur    as a child  . History of bronchitis   . HSV infection    oral  . Hypertension    improved after back pain treated with surgery  . Insomnia   . Joint pain   . OSA on CPAP    he ordered kit and started use 2017 w/o formal testing, see note from 11/30/15.   . Vertigo 10/13/2014   recurrent; trigger is cool air on left ear, improved with daily antihistamine    Patient Active Problem List   Diagnosis Date Noted  . 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 Cough 04/24/2018  . Skin lesion 10/31/2017  . Routine health maintenance 04/23/2017  . Lower urinary tract symptoms (LUTS) 04/17/2016  . Oral lesion 02/16/2016  .  Medicare annual wellness visit, initial 12/01/2015  . Advance care planning 12/01/2015  . Lower back pain 12/01/2015  . OSA on CPAP   . HTN (hypertension) 04/23/2015  . Spondylolisthesis of lumbar region 12/16/2014  . GERD (gastroesophageal reflux disease) 10/11/2012  . RBBB 06/27/2011    Past Surgical History:  Procedure Laterality Date  . ACHILLES TENDON REPAIR Left   . BACK SURGERY    . COLONOSCOPY  02/2011   Negative,[facility]  . DEBRIDEMENT AND CLOSURE WOUND Left 02/25/2018   Procedure: DEBRIDEMENT LEFT LEG APPLICATION INTEGRA;  Surgeon: Irene Limbo, MD;  Location: [facility];  Service: Plastics;  Laterality: Left;  . EYE SURGERY Bilateral    cataracts  . KNEE ARTHROSCOPY     L  . LUMBAR FUSION     2016  . SPINE SURGERY  2003   cervical fusion  . TONSILLECTOMY    . varicoelectomy         Family History  Problem Relation Age of Onset  . Hypertension Mother   . Asthma Mother   . Diabetes Father   . Stroke Father   . Heart attack Paternal Grandfather        >55  . Cancer Sister   . Colon cancer Neg Hx   . Prostate cancer Neg Hx  Social History   Tobacco Use  . Smoking status: Never Smoker  . Smokeless tobacco: Never Used  Substance Use Topics  . Alcohol use: No  . Drug use: No    Home Medications Prior to Admission medications   Medication Sig Start Date End Date Taking? Authorizing Provider  amLODipine (NORVASC) 5 MG tablet Take 1 tablet (5 mg total) by mouth as needed. Please ask patient to make an appointment for physical exam prior to further refills. 04/24/19   Tonia Ghent, MD  benzonatate (TESSALON) 200 MG capsule Take 1 capsule (200 mg total) by mouth 3 (three) times daily as needed. Patient not taking: Reported on 01/14/2019 04/23/18   Tonia Ghent, MD  Cholecalciferol (VITAMIN D) 125 MCG (5000 UT) CAPS Take 5,000 Units by mouth daily.    [provider]  cyclobenzaprine (FLEXERIL) 10 MG tablet Take  0.5-1 tablets (5-10 mg total) by mouth 3 (three) times daily as needed for muscle spasms. 04/01/19   Newman Pies, MD  docusate sodium (COLACE) 100 MG capsule Take 1 capsule (100 mg total) by mouth 2 (two) times daily. 04/01/19   Newman Pies, MD  Echinacea 450 MG CAPS Take 450 mg by mouth daily.    [provider]  loratadine (CLARITIN) 10 MG tablet Take 10 mg by mouth daily.    [provider]  Menthol, Topical Analgesic, (BIOFREEZE EX) Apply 1 application topically daily as needed (pain).    [provider]  Multiple Vitamin (MULTIVITAMIN WITH MINERALS) TABS tablet Take 1 tablet by mouth daily.     [provider]  ondansetron (ZOFRAN) 4 MG tablet Take 1 tablet (4 mg total) by mouth every 6 (six) hours as needed for nausea or vomiting. 04/01/19   Viona Gilmore D, NP  oxyCODONE (OXY IR/ROXICODONE) 5 MG immediate release tablet Take 1 tablet (5 mg total) by mouth every 3 (three) hours as needed for moderate pain ((score 4 to 6)). 04/01/19   Newman Pies, MD  traMADol (ULTRAM) 50 MG tablet Take 50 mg by mouth every 6 (six) hours as needed for moderate pain.    [provider]  zinc gluconate 50 MG tablet Take 50 mg by mouth daily.    [provider]    Allergies    Flomax [tamsulosin hcl] and Morphine and related  Review of Systems   Review of Systems  Constitutional: Negative for chills and fever.  HENT: Negative for congestion, rhinorrhea and sore throat.   Eyes: Negative for visual disturbance.  Respiratory: Negative for cough and shortness of breath.   Cardiovascular: Negative for chest pain, palpitations and leg swelling.  Gastrointestinal: Negative for abdominal pain, diarrhea, nausea and vomiting.  Genitourinary: Negative for dysuria.  Musculoskeletal: Negative for back pain and neck pain.  Skin: Negative for rash.  Neurological: Positive for tremors and light-headedness. Negative for dizziness and headaches.    Hematological: Does not bruise/bleed easily.  Psychiatric/Behavioral: Negative for confusion.    Physical Exam Updated Vital Signs BP (!) 151/96 (BP Location: Left Arm)   Pulse (!) 103   Temp 98.2 F (36.8 C) (Oral)   Resp 16   SpO2 96%   Physical Exam Vitals and nursing note reviewed.  Constitutional:      Appearance: Normal appearance. He is well-developed.  HENT:     Head: Normocephalic and atraumatic.  Eyes:     Extraocular Movements: Extraocular movements intact.     Conjunctiva/sclera: Conjunctivae normal.     Pupils: Pupils are equal, round,  and reactive to light.  Cardiovascular:     Rate and Rhythm: Tachycardia present. Rhythm irregular.     Heart sounds: No murmur.  Pulmonary:     Effort: Pulmonary effort is normal. No respiratory distress.     Breath sounds: Normal breath sounds.  Abdominal:     Palpations: Abdomen is soft.     Tenderness: There is no abdominal tenderness.  Musculoskeletal:     Cervical back: Normal range of motion and neck supple.  Skin:    General: Skin is warm and dry.  Neurological:     General: No focal deficit present.     Mental Status: He is alert and oriented to person, place, and time.     Comments: Tremors.     ED Results / Procedures / Treatments   Labs (all labs ordered are listed, but only abnormal results are displayed) Labs Reviewed  BASIC METABOLIC PANEL - Abnormal; Notable for the following components:      Result Value   Sodium 133 (*)    CO2 20 (*)    Glucose, Bld 130 (*)    GFR calc non Af Amer 58 (*)    All other components within normal limits  SARS CORONAVIRUS 2 (TAT 6-24 HRS)  CBC  TROPONIN I (HIGH SENSITIVITY)  TROPONIN I (HIGH SENSITIVITY)    EKG EKG Interpretation  Date/Time:  Thursday April 24 2019 14:38:17 EST Ventricular Rate:  147 PR Interval:    QRS Duration: 120 QT Interval:  282 QTC Calculation: 441 R Axis:   112 Text Interpretation: Wide QRS tachycardia Right bundle branch block  Possible Lateral infarct , age undetermined Possible Inferior infarct , age undetermined Abnormal ECG Confirmed by Fredia Sorrow [phone]) on 04/24/2019 2:59:35 PM   Radiology DG Chest Port 1 View  Result Date: 04/24/2019 CLINICAL DATA:  Chest pain.  Palpitations. EXAM: PORTABLE CHEST 1 VIEW COMPARISON:  12/20/2014. FINDINGS: Mediastinum and hilar structures normal. Heart size normal. Lungs are clear. No pleural effusion or pneumothorax. Prior cervical spine fusion. Degenerative change thoracic spine. IMPRESSION: No acute cardiopulmonary disease. Electronically Signed   By: Marcello Moores  Register   On: 04/24/2019 15:43    Procedures Procedures (including critical care time)  Medications Ordered in ED Medications  sodium chloride flush (NS) 0.9 % injection 3 mL (has no administration in time range)  0.9 %  sodium chloride infusion ( Intravenous New Bag/Given 04/24/19 1534)  diltiazem (CARDIZEM) 125 mg in dextrose 5% 125 mL (1 mg/mL) infusion (has no administration in time range)  diltiazem (CARDIZEM) injection 10 mg (has no administration in time range)  HYDROmorphone (DILAUDID) injection 1 mg (1 mg Intravenous Given 04/24/19 1534)    ED Course  I have reviewed the triage vital signs and the nursing notes.  Pertinent labs & imaging results that were available during my care of the patient were reviewed by me and considered in my medical decision making (see chart for details).    MDM Rules/Calculators/A&P                         Initial EKG was may be more of a supraventricular tachycardia or sinus tach. Since patient had stopped taking his pain medication which he been taking a fair amount of just 2 days ago thought maybe there was some component of withdrawal. Patient was given 1 mg of hydromorphone. With initial improvement heart rate came down to the 90s it was showing sinus rhythm. And patient's blood pressure improved  down to like 432 systolic.  However further cardiac monitoring showed  that he would go in and out of atrial fibrillation. Sometimes 144 other times he was back down to the 90s with a sinus rhythm. So probably the overall presentation was atrial fibrillation. Patient will be started on diltiazem and diltiazem drip. And will require admission for new onset atrial fibrillation. Patient's chest x-ray negative Covid testing is been ordered. Patient's troponin without any significant elevation. Labs without significant abnormalities. Patient's primary care doctor is not in the area. So will be an unassigned medicine admission.  Patient does have wide complex on the EKG. But does have right bundle branch block pre-existing.    Final Clinical Impression(s) / ED Diagnoses Final diagnoses:  Atrial fibrillation with RVR [facility])    Rx / DC Orders ED Discharge Orders    None       Fredia Sorrow, MD 04/24/19 [phone]

## 2019-04-24 NOTE — Progress Notes (Signed)
ANTICOAGULATION CONSULT NOTE - Initial Consult  Pharmacy Consult for rivaroxaban Indication: atrial fibrillation  Vital Signs: Temp: 98.2 F (36.8 C) (01/07 1443) Temp Source: Oral (01/07 1443) BP: 158/99 (01/07 1745) Pulse Rate: 109 (01/07 1745)  Labs: Recent Labs    04/24/19 1453  HGB 15.2  HCT 44.7  PLT 251  CREATININE 1.23  TROPONINIHS 5    CrCl cannot be calculated (Unknown ideal weight.).   Medical History: Past Medical History:  Diagnosis Date  . Arthritis   . GERD (gastroesophageal reflux disease)   . H/O urinary frequency   . Heart murmur    as a child  . History of bronchitis   . HSV infection    oral  . Hypertension    improved after back pain treated with surgery  . Insomnia   . Joint pain   . OSA on CPAP    he ordered kit and started use 2017 w/o formal testing, see note from 11/30/15.   . Vertigo 10/13/2014   recurrent; trigger is cool air on left ear, improved with daily antihistamine     Assessment: 74 yo male with new onset AFib with RVR. Planning to start Xarelto. H/h and SCr are stable. Planning to admit patient.    Goal of Therapy:  Anti-Xa level 0.6-1 units/ml 4hrs after LMWH dose given Monitor platelets by anticoagulation protocol: Yes   Plan:  -Xarelto 20 mg po daily -Monitor s/sx bleeding   Harvel Quale 04/24/2019,6:38 PM

## 2019-04-24 NOTE — ED Notes (Signed)
Admitting MD in to assess pt. 

## 2019-04-24 NOTE — ED Notes (Signed)
Cardiology in to assess pt at this time

## 2019-04-24 NOTE — Telephone Encounter (Signed)
Agreed.  Thanks.  

## 2019-04-24 NOTE — Telephone Encounter (Signed)
Pt called to get a refill on  Amlodipine He stated drug store told him to call office  [facility]  Pt is out of meds

## 2019-04-24 NOTE — H&P (Deleted)
[facility] History & Physical    [name] 04/24/2019 Sol Blazing MD   Chief Complaint: Lightheadedness HPI: The patient is a 74 y.o. year-old with hx of HTN, DJD presented w/ presyncope to ED.  In ED had HR 150's, no fever, RR 16, BP's up. Hx HTN on norvasc.  Pt just had back surgery recently.  Hx of afib in the past one time after a different surgery.  Hx Parkinson's. Hx chronic pain on oxycodone which he stopped recently a few days ago.  Initial EKG not able to tell SVT vs afib.  In ED pt rec'd 1mg  IV dilaudid for poss withdrawal and HR improved briefly then increased again and repeat EKG reportedly looked more like atrial fib so now is to be getting IV diltiazem bolus and drip. Asked to see for admission.   Pt seen in ED, he is feeling much better now w/ HR's down to 90's from 150's. Pt states he had back surg L5-S1 about 3 wks ago, taking pain pills postop became very constipated and did not have a BM for 2-3 wks, so he stopped his narcotics 2 days ago and took Senna 2 at night and finally had a BM yesterday.  Today he felt lightheaded, no palpitation or CP, did have SOB but not severe.  No hx afib, MI, CP, heart problems.  Recent hx of parkinson's w/ RUE tremors started about 6 mos ago.    Pt is active, likes to go to the beach w/ his wife and they ride ebikes up to 40 miles up and down the beach.  No tob.      ROS  denies CP  no joint pain   no HA  no blurry vision  no rash  no diarrhea  no nausea/ vomiting  no dysuria  no difficulty voiding  no change in urine color    Past Medical History  Past Medical History:  Diagnosis Date  . Allergy   . Arthritis    neck  . Cataract    bilateral - MD monitoring cataracts  . CHF (congestive heart failure) ([place])   . Chronic kidney disease, stage I    DR OTTELIN  HX UTIS  . Cirrhosis ([place])   . Cramp of limb   . Diabetes mellitus   . Dysphagia, unspecified(787.20)   . Dysuria   . Epistaxis   . GERD  (gastroesophageal reflux disease)   . Heart murmur    NO CARDIOLOGIST  DX FOR YEARS ASYMPTOMATIC  . Lumbago   . Neoplasm of uncertain behavior of skin   . Nonspecific elevation of levels of transaminase or lactic acid dehydrogenase (LDH)   . Osteoarthrosis, unspecified whether generalized or localized, unspecified site   . Other and unspecified hyperlipidemia    diet controlled  . Pain in joint, shoulder region   . Paresthesias 03/10/2015  . Postablative ovarian failure   . Trochanteric bursitis of left hip 11/23/2015  . Type 2 diabetes mellitus without complication ([place])   . Unspecified essential hypertension    no meds   Past Surgical History  Past Surgical History:  Procedure Laterality Date  . BREAST BIOPSY    . CARDIAC CATHETERIZATION N/A 01/05/2016   Procedure: Left Heart Cath and Coronary Angiography;  Surgeon: Belva Crome, MD;  Location: [facility];  Service: Cardiovascular;  Laterality: N/A;  . COLONOSCOPY  2012   Dr Lajoyce Corners.   . COLONOSCOPY WITH PROPOFOL N/A 06/15/2016   Procedure: COLONOSCOPY WITH  PROPOFOL;  Surgeon: Milus Banister, MD;  Location: [facility];  Service: Endoscopy;  Laterality: N/A;  . 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 ENDOVEIN HARVEST OF GREATER SAPHENOUS VEIN Right 01/06/2016   Procedure: ENDOVEIN HARVEST OF GREATER SAPHENOUS VEIN;  Surgeon: Melrose Nakayama, MD;  Location: [facility];  Service: Open Heart Surgery;  Laterality: Right;  . ESOPHAGEAL BANDING  03/06/2019   Procedure: ESOPHAGEAL BANDING;  Surgeon: Milus Banister, MD;  Location: [facility];  Service: Endoscopy;;  . ESOPHAGEAL BANDING  03/16/2019   Procedure: ESOPHAGEAL BANDING;  Surgeon: Juanita Craver, MD;  Location[facility];  Service: Endoscopy;;  .  ESOPHAGOGASTRODUODENOSCOPY N/A 03/16/2019   Procedure: ESOPHAGOGASTRODUODENOSCOPY (EGD);  Surgeon: Juanita Craver, MD;  Location: [facility] ENDOSCOPY;  Service: Endoscopy;  Laterality: N/A;  . ESOPHAGOGASTRODUODENOSCOPY (EGD) WITH PROPOFOL N/A 06/15/2016   Procedure: ESOPHAGOGASTRODUODENOSCOPY (EGD) WITH PROPOFOL;  Surgeon: Milus Banister, MD;  Location: [facility];  Service: Endoscopy;  Laterality: N/A;  . ESOPHAGOGASTRODUODENOSCOPY (EGD) WITH PROPOFOL N/A 03/06/2019   Procedure: ESOPHAGOGASTRODUODENOSCOPY (EGD) WITH PROPOFOL;  Surgeon: Milus Banister, MD;  Location: [facility];  Service: Endoscopy;  Laterality: N/A;  . HEMOSTASIS CLIP PLACEMENT  03/16/2019   Procedure: HEMOSTASIS CLIP PLACEMENT;  Surgeon: Juanita Craver, MD;  Location: [facility];  Service: Endoscopy;;  . IR ANGIOGRAM SELECTIVE EACH ADDITIONAL VESSEL  03/17/2019  . IR EMBO ART  VEN HEMORR LYMPH EXTRAV  INC GUIDE ROADMAPPING  03/17/2019  . IR PARACENTESIS  03/17/2019  . IR TIPS  03/17/2019  . MAXIMUM ACCESS (MAS)POSTERIOR LUMBAR INTERBODY FUSION (PLIF) 1 LEVEL Left 05/19/2015   Procedure: FOR MAXIMUM ACCESS (MAS) POSTERIOR LUMBAR INTERBODY FUSION (PLIF) LUMBAR THREE-FOUR EXTRAFORAMINAL MICRODISCECTOMY LUMBAR FIVE-SACRAL ONE LEFT;  Surgeon: Eustace Moore, MD;  Location: [facility];  Service: Neurosurgery;  Laterality: Left;  . RADIOLOGY WITH ANESTHESIA N/A 03/17/2019   Procedure: RADIOLOGY WITH ANESTHESIA;  Surgeon: Radiologist, Medication, MD;  Location: [facility];  Service: Radiology;  Laterality: N/A;  . SCLEROTHERAPY  03/16/2019   Procedure: SCLEROTHERAPY;  Surgeon: Juanita Craver, MD;  Location: [facility] ENDOSCOPY;  Service: Endoscopy;;  . TEE WITHOUT CARDIOVERSION N/A 01/06/2016   Procedure: TRANSESOPHAGEAL ECHOCARDIOGRAM (TEE);  Surgeon: Melrose Nakayama, MD;  Location: [facility];  Service: Open Heart Surgery;  Laterality: N/A;  . TUBAL LIGATION  1982   Dr Connye Burkitt  . UPPER GASTROINTESTINAL ENDOSCOPY    . VAGINAL HYSTERECTOMY  1997   Dr  Rande Lawman   Family History  Family History  Problem Relation Age of Onset  . Lung cancer Father   . Arthritis Sister   . Arthritis Brother   . Heart disease Maternal Grandmother   . Heart disease Maternal Grandfather   . Heart disease Paternal Grandmother   . Heart disease Paternal Grandfather   . Breast cancer Mother   . Liver cancer Brother   . Breast cancer Maternal Aunt   . Breast cancer Paternal Aunt   . Colon cancer Neg Hx   . Esophageal cancer Neg Hx   . Rectal cancer Neg Hx   . Stomach cancer Neg Hx    Social History  reports that she has never smoked. She has never used smokeless tobacco. She reports that she does not drink alcohol or use drugs. Allergies  Allergies  Allergen Reactions  .  Kiwi Extract Anaphylaxis  . Tdap [Tetanus-Diphth-Acell Pertussis] Swelling and Other (See Comments)    Swelling at injection site, gets very hot  . Statins     RHABDOMYOLYSIS  . Latex Itching, Dermatitis and Rash  . Tramadol Nausea And Vomiting   Home medications Prior to Admission medications   Medication Sig Start Date End Date Taking? Authorizing Provider  acetaminophen (TYLENOL) 500 MG tablet Take 500 mg by mouth at bedtime.     [provider]  Aromatic Inhalants (VICKS VAPOR IN) Vicks Vapor Rub apply small amount to outside of nose to help breathing    [provider]  BD PEN NEEDLE NANO U/F 32G X 4 MM MISC USE THREE TIMES DAILY AS DIRECTED 11/04/18   Reed, Tiffany L, DO  Biotin 10000 MCG TABS Take 10,000 mcg by mouth every morning.    [provider]  bisacodyl (DULCOLAX) 10 MG suppository Place 1 suppository (10 mg total) rectally daily as needed for moderate constipation. 03/21/19   Aline August, MD  calcium carbonate (OS-CAL) 600 MG TABS Take 600 mg by mouth 2 (two) times daily with a meal.      [provider]  Cholecalciferol (VITAMIN D) 50 MCG (2000 UT) CAPS Take 2,000 Units by mouth daily.     [provider]  Cyanocobalamin  (VITAMIN B 12 PO) Take 1,000 mcg by mouth daily.      [provider]  ezetimibe (ZETIA) 10 MG tablet TAKE 1 TABLET(10 MG) BY MOUTH DAILY 12/04/18   Reed, Tiffany L, DO  furosemide (LASIX) 40 MG tablet Take 40 mg by mouth daily.     [provider]  glucose blood test strip One Touch Ultra II strips. Use to test blood sugar three times daily. Dx: E11.65 05/10/17   Reed, Tiffany L, DO  insulin detemir (LEVEMIR) 100 UNIT/ML injection Inject 0.2 mLs (20 Units total) into the skin at bedtime. 03/22/19   Aline August, MD  Insulin Syringe-Needle U-100 (INSULIN SYRINGE 1CC/31GX5/16") 31G X 5/16" 1 ML MISC USE AS DIRECTED DAILY WITH LEVEMIR 07/05/18   Reed, Tiffany L, DO  JARDIANCE 25 MG TABS tablet Take 25 mg by mouth daily. 04/08/19   [provider]  lactulose (CHRONULAC) 10 GM/15ML solution Take 20 g by mouth 3 (three) times daily. 04/12/19   [provider]  loratadine (CLARITIN) 10 MG tablet Take 10 mg by mouth daily as needed for allergies.    [provider]  MAGNESIUM PO Take 500 mg by mouth 2 (two) times daily in the am and at bedtime..    [provider]  Multiple Vitamins-Minerals (MULTIVITAMIN WITH MINERALS) tablet Take 1 tablet by mouth daily.      [provider]  NOVOLOG FLEXPEN 100 UNIT/ML FlexPen Inject 12 units under the skin every morning, 8 units at lunch and 12 units at supper 01/28/19   Reed, Tiffany L, DO  ondansetron (ZOFRAN) 4 MG tablet Take 1 tablet (4 mg total) by mouth every 6 (six) hours as needed for nausea. 03/21/19   Aline August, MD  pantoprazole (PROTONIX) 40 MG tablet Take 1 tablet (40 mg total) by mouth 2 (two) times daily. 03/21/19   Aline August, MD  Polyethyl Glycol-Propyl Glycol (SYSTANE OP) Place 1 drop into both eyes 2 (two) times daily.    [provider]  Probiotic Product (PROBIOTIC DAILY PO) Take 1 capsule by mouth daily. Digestive Advantage Probiotic    [provider]   spironolactone (ALDACTONE) 50 MG tablet Take  1 tablet (50 mg total) by mouth 2 (two) times daily. 03/24/19   Gayland Curry, DO   Liver Function Tests Recent Labs  Lab 04/21/19 1436 04/23/19 1042  AST 58* 62*  ALT 41* 45*  ALKPHOS  --  127*  BILITOT 3.5* 3.4*  PROT 7.8 7.2  ALBUMIN  --  3.0*   Recent Labs  Lab 04/23/19 1042  LIPASE 29   CBC Recent Labs  Lab 04/21/19 1436 04/23/19 1042 04/23/19 1056  WBC 10.4 7.7  --   NEUTROABS 8,299* 5.9  --   HGB 16.1* 14.9 15.0  HCT 47.2* 43.4 44.0  MCV 92.5 94.6  --   PLT 151 116*  --    Basic Metabolic Panel Recent Labs  Lab 04/21/19 1436 04/23/19 1042 04/23/19 1056  NA 133* 131* 133*  K 4.5 4.6 4.6  CL 96* 97*  --   CO2 24 23  --   GLUCOSE 269* 138*  --   BUN 19 20  --   CREATININE 1.05* 1.13*  --   CALCIUM 11.1* 10.0  --    Iron/TIBC/Ferritin/ %Sat    Component Value Date/Time   IRON 29 04/15/2016 1422   TIBC 427 04/15/2016 1422   FERRITIN 13 04/15/2016 1422   IRONPCTSAT 7 (L) 04/15/2016 1422    Vitals:   04/23/19 1018 04/23/19 1030 04/23/19 1045 04/23/19 1215  BP: (!) 123/51 (!) 101/46  (!) 125/54  Pulse: 89 83    Resp: 16 15  20   Temp: 97.8 F (36.6 C)  97.9 F (36.6 C)   TempSrc: Oral  Rectal   SpO2: 99% 98%      Exam Gen alert, no distress No rash, cyanosis or gangrene Sclera anicteric, throat clear  No jvd or bruits Chest clear bilat RRR no MRG  Abd soft ntnd no mass or ascites +bs GU normal male MS no joint effusions or deformity Ext no LE edema, no wounds or ulcers Neuro is alert, Ox 3 , nf , R arm has tremors    Home meds:  - amlodipine 5 qd  - oxycodone IR tid prn/ tramadol 50 qid prn  - prn's/ vitamins/ supplements     Na 133 K 4  BUN 10  Cr 1.23  Ca 9.8  WBC 9k  Hb 15  plt 251   Trop 5,  CXR no acute disease     EKG #1 Undetermined rhythm, Rightward axis, Incomplete right bundle branch block, Nonspecific ST abnormality Abnormal ECG    EKG #2  Wide QRS tachycardia, Right  bundle branch block, Possible Lateral and inferior infarct age undetermined Abnormal ECG  Assessment/ Plan: 1. New onset atrial fib w/ RVR - no hx CAD or other heart problems. Started on IV diltiazem here, now appears to be in NSR at 90's.  BP's better. Will admit and consulting cardiology to see and assist.  Ordered echo. Cont IV dilt.  2. HTN - cont amlodipine 3. Parkinson's - not on meds 4. Recent L-S back surgery - pain relief was very good, took himself off pain meds recently      Kelly Splinter, MD   [facility] 04/24/2019, 1:10 PM

## 2019-04-24 NOTE — ED Triage Notes (Signed)
Pt arrives to ED with lightheaded and feeling faint: pt noted to have wide complex tachycardia rate 150's. EKG taken to MD- charge made aware needs room asap.

## 2019-04-24 NOTE — Telephone Encounter (Signed)
Pt said had back surgery 4 wks ago and BP has been more elevated recently due to back pain. Today back pain level is 4. Using ice on back/ pt has felt anxious for couple of days.  Pt took Amlodipine 5 mg this AM at 8:30 and took another amlodipine 5 mg at 12:50.Marland Kitchen pt said rx bottle said to discard after 03/20. 1 hr ago BP 130/98 and 15 mins ago 178/110 pulse 150; now BP 187/150 P 110. Pt had H/A earlier but none now. No CP,SOB or dizziness.pts refused 911 and pts wife is taking pt to [facility] ED now. FYI to Dr Damita Dunnings.

## 2019-04-24 NOTE — ED Notes (Signed)
Dinner tray ordered for pt

## 2019-04-25 ENCOUNTER — Inpatient Hospital Stay (HOSPITAL_COMMUNITY): Payer: PPO

## 2019-04-25 DIAGNOSIS — G20A1 Parkinson's disease without dyskinesia, without mention of fluctuations: Secondary | ICD-10-CM

## 2019-04-25 DIAGNOSIS — G20 Parkinson's disease: Secondary | ICD-10-CM

## 2019-04-25 DIAGNOSIS — F0280 Dementia in other diseases classified elsewhere without behavioral disturbance: Secondary | ICD-10-CM

## 2019-04-25 LAB — URINALYSIS, COMPLETE (UACMP) WITH MICROSCOPIC
Bacteria, UA: NONE SEEN
Bilirubin Urine: NEGATIVE
Glucose, UA: NEGATIVE mg/dL
Hgb urine dipstick: NEGATIVE
Ketones, ur: NEGATIVE mg/dL
Leukocytes,Ua: NEGATIVE
Nitrite: NEGATIVE
Protein, ur: NEGATIVE mg/dL
Specific Gravity, Urine: 1.008 (ref 1.005–1.030)
pH: 6 (ref 5.0–8.0)

## 2019-04-25 LAB — BASIC METABOLIC PANEL
Anion gap: 10 (ref 5–15)
BUN: 10 mg/dL (ref 8–23)
CO2: 26 mmol/L (ref 22–32)
Calcium: 8.8 mg/dL — ABNORMAL LOW (ref 8.9–10.3)
Chloride: 100 mmol/L (ref 98–111)
Creatinine, Ser: 1.12 mg/dL (ref 0.61–1.24)
GFR calc Af Amer: >60 mL/min (ref >60)
GFR calc non Af Amer: >60 mL/min (ref >60)
Glucose, Bld: 96 mg/dL (ref 70–99)
Potassium: 4.1 mmol/L (ref 3.5–5.1)
Sodium: 136 mmol/L (ref 135–145)

## 2019-04-25 LAB — CBC
HCT: 41.5 % (ref 39.0–52.0)
Hemoglobin: 13.7 g/dL (ref 13.0–17.0)
MCH: 30.4 pg (ref 26.0–34.0)
MCHC: 33 g/dL (ref 30.0–36.0)
MCV: 92 fL (ref 80.0–100.0)
Platelets: 256 10*3/uL (ref 150–400)
RBC: 4.51 MIL/uL (ref 4.22–5.81)
RDW: 12.4 % (ref 11.5–15.5)
WBC: 9.3 10*3/uL (ref 4.0–10.5)
nRBC: 0 % (ref 0.0–0.2)

## 2019-04-25 LAB — ECHOCARDIOGRAM COMPLETE
Height: 75 in
Weight: 3291.2 oz

## 2019-04-25 MED ORDER — SENNOSIDES-DOCUSATE SODIUM 8.6-50 MG PO TABS: 1.0000 | ORAL_TABLET | Freq: Two times a day (BID) | ORAL | Status: DC

## 2019-04-25 MED ORDER — BISACODYL 5 MG PO TBEC: 5.0000 mg | DELAYED_RELEASE_TABLET | Freq: Every day | ORAL | 0 refills | Status: DC | PRN

## 2019-04-25 MED ORDER — DOCUSATE SODIUM 100 MG PO CAPS
100.0000 mg | ORAL_CAPSULE | Freq: Two times a day (BID) | ORAL | Status: DC
  Administered 2019-04-25: 100 mg via ORAL
  Filled 2019-04-25: qty 1

## 2019-04-25 MED ORDER — LOSARTAN POTASSIUM 50 MG PO TABS
50.0000 mg | ORAL_TABLET | Freq: Every day | ORAL | Status: DC
  Administered 2019-04-25: 50 mg via ORAL
  Filled 2019-04-25: qty 1

## 2019-04-25 MED ORDER — AMLODIPINE BESYLATE 10 MG PO TABS: 10.0000 mg | ORAL_TABLET | ORAL | 1 refills | Status: DC | PRN

## 2019-04-25 MED ORDER — ZINC GLUCONATE 50 MG PO TABS: 50.0000 mg | ORAL_TABLET | Freq: Every day | ORAL | Status: DC

## 2019-04-25 MED ORDER — RIVAROXABAN 20 MG PO TABS: 20.0000 mg | ORAL_TABLET | Freq: Every day | ORAL | 1 refills | Status: DC

## 2019-04-25 MED ORDER — ADULT MULTIVITAMIN W/MINERALS CH
1.0000 | ORAL_TABLET | Freq: Every day | ORAL | Status: DC
  Administered 2019-04-25: 1 via ORAL
  Filled 2019-04-25: qty 1

## 2019-04-25 MED ORDER — PROPRANOLOL HCL ER 120 MG PO CP24: 120.0000 mg | ORAL_CAPSULE | Freq: Every day | ORAL | 1 refills | Status: DC

## 2019-04-25 MED ORDER — HYDROCHLOROTHIAZIDE 12.5 MG PO CAPS
12.5000 mg | ORAL_CAPSULE | Freq: Every day | ORAL | Status: DC
  Administered 2019-04-25: 12.5 mg via ORAL
  Filled 2019-04-25: qty 1

## 2019-04-25 MED ORDER — CYCLOBENZAPRINE HCL 10 MG PO TABS: 5.0000 mg | ORAL_TABLET | Freq: Three times a day (TID) | ORAL | Status: DC | PRN

## 2019-04-25 MED ORDER — LOSARTAN POTASSIUM 50 MG PO TABS: 50.0000 mg | ORAL_TABLET | Freq: Every day | ORAL | 1 refills | Status: DC

## 2019-04-25 MED ORDER — TRAMADOL HCL 50 MG PO TABS: 50.0000 mg | ORAL_TABLET | Freq: Four times a day (QID) | ORAL | Status: DC | PRN

## 2019-04-25 MED ORDER — AMLODIPINE BESYLATE 10 MG PO TABS: 10.0000 mg | ORAL_TABLET | Freq: Every day | ORAL | Status: DC

## 2019-04-25 MED ORDER — HYDROCHLOROTHIAZIDE 12.5 MG PO CAPS: 12.5000 mg | ORAL_CAPSULE | Freq: Every day | ORAL | 1 refills | Status: DC

## 2019-04-25 MED ORDER — AMLODIPINE BESYLATE 5 MG PO TABS: 5.0000 mg | ORAL_TABLET | Freq: Every day | ORAL | Status: DC

## 2019-04-25 MED ORDER — FLECAINIDE ACETATE 50 MG PO TABS: 50.0000 mg | ORAL_TABLET | Freq: Two times a day (BID) | ORAL | Status: DC

## 2019-04-25 MED ORDER — ZINC SULFATE 220 (50 ZN) MG PO CAPS
220.0000 mg | ORAL_CAPSULE | Freq: Every day | ORAL | Status: DC
  Filled 2019-04-25: qty 1

## 2019-04-25 MED ORDER — FLECAINIDE ACETATE 50 MG PO TABS: 50.0000 mg | ORAL_TABLET | Freq: Two times a day (BID) | ORAL | 1 refills | Status: DC

## 2019-04-25 MED ORDER — VITAMIN D 25 MCG (1000 UNIT) PO TABS
5000.0000 [IU] | ORAL_TABLET | Freq: Every day | ORAL | Status: DC
  Administered 2019-04-25: 5000 [IU] via ORAL
  Filled 2019-04-25: qty 5

## 2019-04-25 NOTE — Discharge Summary (Signed)
Physician Discharge Summary  [name] [id number] DOB: Jun 06, 1945 DOA: 04/24/2019  PCP: Tonia Ghent, MD  Admit date: 04/24/2019 Discharge date: 04/25/2019  Time spent: 55 minutes  Recommendations for Outpatient Follow-up:  1. Follow-up with Dr. Einar Gip, 05/01/2019. 2. Follow-up with Tonia Ghent, MD in 2 weeks.  On follow-up patient will need a basic metabolic profile done to follow-up on electrolytes and renal function.  Patient blood pressure needs to be reassessed as patient's antihypertensive medications have been adjusted and ARB as well as HCTZ and propranolol added to patient's regimen.   Discharge Diagnoses:  Principal Problem:   Atrial fibrillation with RVR ([place]) Active Problems:   RBBB   HTN (hypertension)   History of recent back surgery   Dementia due to Parkinson's disease without behavioral disturbance [facility])   Discharge Condition: Stable and improved  Diet recommendation: Heart healthy  Filed Weights   04/24/19 1822 04/24/19 2053 04/25/19 0456  Weight: 97.1 kg 93.4 kg 93.3 kg    History of present illness:  HPI per Dr. Jonnie Finner The patient is a 74 y.o. year-old with hx of HTN, DJD presented w/ presyncope to ED.  In ED had HR 150's, no fever, RR 16, BP's up. Hx HTN on norvasc.  Pt just had back surgery recently.  Hx of afib in the past one time after a different surgery.  Hx Parkinson's. Hx chronic pain on oxycodone which he stopped recently a few days ago.  Initial EKG not able to tell SVT vs afib.  In ED pt rec'd 1mg  IV dilaudid for poss withdrawal and HR improved briefly then increased again and repeat EKG reportedly looked more like atrial fib so now is to be getting IV diltiazem bolus and drip. Asked to see for admission.   Pt seen in ED, he is feeling much better now w/ HR's down to 90's from 150's. Pt states he had back surg L5-S1 about 3 wks ago, taking pain pills postop became very constipated and did not have a BM for 2-3 wks, so he stopped his  narcotics 2 days ago and took Senna 2 at night and finally had a BM yesterday.  Today he felt lightheaded, no palpitation or CP, did have SOB but not severe.  No hx afib, MI, CP, heart problems.  Recent hx of parkinson's w/ RUE tremors started about 6 mos ago.    Pt is active, likes to go to the beach w/ his wife and they ride ebikes up to 40 miles up and down the beach.  No tob.    Hospital Course:  1 new onset paroxysmal A. fib with RVR Questionable etiology.  Patient with no prior history of coronary artery disease however has a history of hypertension and noted right bundle branch block on EKG with atrial flutter with 2:1 conduction.  Patient was placed on IV Cardizem and cardiology consulted.  2D echo which was done had a EF of 60 to 65%, no LVH, no wall motion abnormalities.  Patient noted on telemetry to have paroxysmal episodes of A. fib with RVR and subsequently went back into sinus rhythm on day of discharge.  Patient was maintaining in sinus rhythm.  Patient was started on propranolol ER 120 mg daily for rate control and flecainide added per cardiology.  Patient was started on Xarelto for anticoagulation.  Close outpatient follow-up with cardiology 10 days post discharge.  Patient was discharged in stable and improved condition.   2.  Hypertension Patient noted to have uncontrolled blood  pressure during the hospitalization.  Patient's Norvasc was increased to 10 mg daily and patient had started on propranolol ER 120 mg daily.  HCTZ and ARB were added per cardiology recommendations.  Blood pressure improved.  Outpatient follow-up.  3.  Parkinson's disease Not on any medication at this time.  Patient started on propranolol for rate control which will help with tremors of Parkinson's.  Outpatient follow-up with PCP and neurology.  4.  Recent L/S back surgery Patient took himself of pain medications due to constipation.  Patient denied any acute pain during the hospitalization.  Outpatient  follow-up.    Procedures:  Chest x-ray 04/24/2019  2D echo 04/25/2019  Consultations:  Cardiology: Dr. Einar Gip 04/24/2019   Discharge Exam: Vitals:   04/25/19 0446 04/25/19 1332  BP: (!) 139/94 124/81  Pulse: 65 63  Resp: 18 12  Temp: 97.9 F (36.6 C) 98.6 F (37 C)  SpO2: 98%     General: nad Cardiovascular: RRR Respiratory: CTAB  Discharge Instructions   Discharge Instructions    Diet - low sodium heart healthy   Complete by: As directed    Increase activity slowly   Complete by: As directed      Allergies as of 04/25/2019      Reactions   Flomax [tamsulosin Hcl] Other (See Comments)   Lack of effect, not allergy or intolerance.    Morphine And Related Hives   Hallucinations- can tolerate oxycodone.       Medication List    STOP taking these medications   docusate sodium 100 MG capsule Commonly known as: COLACE     TAKE these medications   amLODipine 10 MG tablet Commonly known as: NORVASC Take 1 tablet (10 mg total) by mouth as needed. Please ask patient to make an appointment for physical exam prior to further refills. What changed:   medication strength  how much to take   BIOFREEZE EX Apply 1 application topically daily as needed (pain).   bisacodyl 5 MG EC tablet Commonly known as: DULCOLAX Take 1 tablet (5 mg total) by mouth daily as needed for moderate constipation.   cyclobenzaprine 10 MG tablet Commonly known as: FLEXERIL Take 0.5-1 tablets (5-10 mg total) by mouth 3 (three) times daily as needed for muscle spasms.   Echinacea 450 MG Caps Take 450 mg by mouth daily.   flecainide 50 MG tablet Commonly known as: TAMBOCOR Take 1 tablet (50 mg total) by mouth 2 (two) times daily.   hydrochlorothiazide 12.5 MG capsule Commonly known as: MICROZIDE Take 1 capsule (12.5 mg total) by mouth daily. Start taking on: April 26, 2019   loratadine 10 MG tablet Commonly known as: CLARITIN Take 10 mg by mouth daily.   losartan 50 MG  tablet Commonly known as: COZAAR Take 1 tablet (50 mg total) by mouth daily. Start taking on: April 26, 2019   multivitamin with minerals Tabs tablet Take 1 tablet by mouth daily.   ondansetron 4 MG tablet Commonly known as: ZOFRAN Take 1 tablet (4 mg total) by mouth every 6 (six) hours as needed for nausea or vomiting.   oxyCODONE 5 MG immediate release tablet Commonly known as: Oxy IR/ROXICODONE Take 1 tablet (5 mg total) by mouth every 3 (three) hours as needed for moderate pain ((score 4 to 6)).   propranolol ER 120 MG 24 hr capsule Commonly known as: INDERAL LA Take 1 capsule (120 mg total) by mouth daily.   rivaroxaban 20 MG Tabs tablet Commonly known as: XARELTO Take  1 tablet (20 mg total) by mouth daily with supper.   senna-docusate 8.6-50 MG tablet Commonly known as: Senokot-S Take 1 tablet by mouth 2 (two) times daily.   traMADol 50 MG tablet Commonly known as: ULTRAM Take 50 mg by mouth every 6 (six) hours as needed for moderate pain.   Vitamin D 125 MCG (5000 UT) Caps Take 5,000 Units by mouth daily.   zinc gluconate 50 MG tablet Take 50 mg by mouth daily.      Allergies  Allergen Reactions  . Flomax [Tamsulosin Hcl] Other (See Comments)    Lack of effect, not allergy or intolerance.   . Morphine And Related Hives    Hallucinations- can tolerate oxycodone.    Follow-up Information    Adrian Prows, MD Follow up on 05/01/2019.   Specialty: Cardiology Why: 4 pm appointment Contact information: [place] [address] [phone]        Tonia Ghent, MD. Schedule an appointment as soon as possible for a visit in 2 week(s).   Specialty: Family Medicine Contact information: [place] [address] [phone]            The results of significant diagnostics from this hospitalization (including imaging, microbiology, ancillary and laboratory) are listed below for reference.    Significant  Diagnostic Studies: DG Lumbar Spine 2-3 Views  Result Date: 03/31/2019 CLINICAL DATA:  74 year old male undergoing lumbar surgery. EXAM: LUMBAR SPINE - 2-3 VIEW; DG C-ARM 1-60 MIN COMPARISON:  Lumbar radiographs [place] [facility] 03/20/2019 and earlier. FINDINGS: Two intraoperative fluoroscopic spot views of the lower lumbar spine. Using the same numbering system on MRI 03/10/2019, pre-existing L4-L5 posterior and interbody fusion hardware again noted. Connecting rods have been removed. There are new pedicle screws at S1 and L5-S1 interbody implant. 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 Electronically Signed   By: Genevie Ann M.D.   On: 03/31/2019 12:24   DG Chest Port 1 View  Result Date: 04/24/2019 CLINICAL DATA:  Chest pain.  Palpitations. EXAM: PORTABLE CHEST 1 VIEW COMPARISON:  12/20/2014. FINDINGS: Mediastinum and hilar structures normal. Heart size normal. Lungs are clear. No pleural effusion or pneumothorax. Prior cervical spine fusion. Degenerative change thoracic spine. IMPRESSION: No acute cardiopulmonary disease. Electronically Signed   By: Marcello Moores  Register   On: 04/24/2019 15:43   DG C-Arm 1-60 Min  Result Date: 03/31/2019 CLINICAL DATA:  74 year old male undergoing lumbar surgery. EXAM: LUMBAR SPINE - 2-3 VIEW; DG C-ARM 1-60 MIN COMPARISON:  Lumbar radiographs [place] [facility] 03/20/2019 and earlier. FINDINGS: Two intraoperative fluoroscopic spot views of the lower lumbar spine. Using the same numbering system on MRI 03/10/2019, pre-existing L4-L5 posterior and interbody fusion hardware again noted. Connecting rods have been removed. There are new pedicle screws at S1 and L5-S1 interbody implant. 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 Electronically Signed   By: Genevie Ann M.D.   On: 03/31/2019 12:24   ECHOCARDIOGRAM COMPLETE  Result Date: 04/25/2019   ECHOCARDIOGRAM REPORT   Patient Name:   [name] Date of Exam: 04/25/2019 Medical  Rec #:  [id number]         Height:       75.0 in Accession #:    [id number]        Weight:       205.7 lb Date of Birth:  02/14/1946         BSA:  2.22 m Patient Age:    74 years          BP:           139/94 mmHg Patient Gender: M                 HR:           57 bpm. Exam Location:  Inpatient Procedure: 2D Echo Indications:     Atrial Fibrillation I48.91  History:         Patient has no prior history of Echocardiogram examinations.                  Signs/Symptoms:Murmur; Risk Factors:Hypertension.  Sonographer:     Mikki Santee RDCS (AE) Referring Phys:  [id number] Roney Jaffe Diagnosing Phys: Adrian Prows MD IMPRESSIONS  1. Left ventricular ejection fraction, by visual estimation, is 60 to 65%. The left ventricle has normal function. There is no left ventricular hypertrophy.  2. The left ventricle has no regional wall motion abnormalities.  3. Global right ventricle has normal systolic function.The right ventricular size is normal. No increase in right ventricular wall thickness.  4. Left atrial size was normal.  5. Right atrial size was normal.  6. The mitral valve is normal in structure. No evidence of mitral valve regurgitation. No evidence of mitral stenosis.  7. The tricuspid valve is normal in structure.  8. The aortic valve is normal in structure. Aortic valve regurgitation is not visualized. No evidence of aortic valve sclerosis or stenosis.  9. The pulmonic valve was normal in structure. Pulmonic valve regurgitation is not visualized. 10. The inferior vena cava is normal in size with greater than 50% respiratory variability, suggesting right atrial pressure of 3 mmHg. FINDINGS  Left Ventricle: Left ventricular ejection fraction, by visual estimation, is 60 to 65%. The left ventricle has normal function. The left ventricle has no regional wall motion abnormalities. There is no left ventricular hypertrophy. Normal left atrial pressure. Right Ventricle: The right ventricular size is normal. No  increase in right ventricular wall thickness. Global RV systolic function is has normal systolic function. Left Atrium: Left atrial size was normal in size. Right Atrium: Right atrial size was normal in size Pericardium: There is no evidence of pericardial effusion. Mitral Valve: The mitral valve is normal in structure. No evidence of mitral valve regurgitation. No evidence of mitral valve stenosis by observation. Tricuspid Valve: The tricuspid valve is normal in structure. Tricuspid valve regurgitation is not demonstrated. Aortic Valve: The aortic valve is normal in structure. Aortic valve regurgitation is not visualized. The aortic valve is structurally normal, with no evidence of sclerosis or stenosis. Pulmonic Valve: The pulmonic valve was normal in structure. Pulmonic valve regurgitation is not visualized. Pulmonic regurgitation is not visualized. Aorta: The aortic root, ascending aorta and aortic arch are all structurally normal, with no evidence of dilitation or obstruction. Venous: The inferior vena cava is normal in size with greater than 50% respiratory variability, suggesting right atrial pressure of 3 mmHg. IAS/Shunts: No atrial level shunt detected by color flow Doppler.  LEFT VENTRICLE PLAX 2D LVIDd:         4.40 cm  Diastology LVIDs:         2.90 cm  LV e' lateral:   9.46 cm/s LV PW:         1.00 cm  LV E/e' lateral: 9.3 LV IVS:        1.00 cm  LV e' medial:    7.18  cm/s LVOT diam:     2.30 cm  LV E/e' medial:  12.3 LV SV:         55 ml LV SV Index:   24.89 LVOT Area:     4.15 cm  RIGHT VENTRICLE RV S prime:     11.40 cm/s TAPSE (M-mode): 2.0 cm LEFT ATRIUM             Index       RIGHT ATRIUM           Index LA diam:        2.70 cm 1.22 cm/m  RA Area:     15.70 cm LA Vol (A2C):   38.8 ml 17.47 ml/m RA Volume:   36.70 ml  16.53 ml/m LA Vol (A4C):   36.7 ml 16.53 ml/m LA Biplane Vol: 41.5 ml 18.69 ml/m  AORTIC VALVE LVOT Vmax:   105.00 cm/s LVOT Vmean:  66.400 cm/s LVOT VTI:    0.212 m  AORTA Ao  Root diam: 2.80 cm MITRAL VALVE MV Area (PHT): 3.60 cm             SHUNTS MV PHT:        61.19 msec           Systemic VTI:  0.21 m MV Decel Time: 211 msec             Systemic Diam: 2.30 cm MV E velocity: 88.30 cm/s 103 cm/s MV A velocity: 70.30 cm/s 70.3 cm/s MV E/A ratio:  1.26       1.5  Adrian Prows MD Electronically signed by Adrian Prows MD Signature Date/Time: 04/25/2019/2:58:46 PM    Final     Microbiology: Recent Results (from the past 240 hour(s))  SARS CORONAVIRUS 2 (TAT 6-24 HRS) Nasopharyngeal Nasopharyngeal Swab     Status: None   Collection Time: 04/24/19  6:48 PM   Specimen: Nasopharyngeal Swab  Result Value Ref Range Status   SARS Coronavirus 2 NEGATIVE NEGATIVE Final    Comment: (NOTE) SARS-CoV-2 target nucleic acids are NOT DETECTED. The SARS-CoV-2 RNA is generally detectable in upper and lower respiratory specimens during the acute phase of infection. Negative results do not preclude SARS-CoV-2 infection, do not rule out co-infections with other pathogens, and should not be used as the sole basis for treatment or other patient management decisions. Negative results must be combined with clinical observations, patient history, and epidemiological information. The expected result is Negative. Fact Sheet for Patients: SugarRoll.be Fact Sheet for Healthcare Providers: https://www.woods-mathews.com/ This test is not yet approved or cleared by the [place] FDA and  has been authorized for detection and/or diagnosis of SARS-CoV-2 by FDA under an Emergency Use Authorization (EUA). This EUA will remain  in effect (meaning this test can be used) for the duration of the COVID-19 declaration under Section 56 4(b)(1) of the Act, 21 U.S.C. section 360bbb-3(b)(1), unless the authorization is terminated or revoked sooner. Performed at [facility], [place] [address] [place], [place] [address]      Labs: Basic Metabolic Panel: Recent  Labs  Lab 04/24/19 1453 04/25/19 0318  NA 133* 136  K 4.0 4.1  CL 100 100  CO2 20* 26  GLUCOSE 130* 96  BUN 10 10  CREATININE 1.23 1.12  CALCIUM 9.8 8.8*   Liver Function Tests: No results for input(s): AST, ALT, ALKPHOS, BILITOT, PROT, ALBUMIN in the last 168 hours. No results for input(s): LIPASE, AMYLASE in the last 168 hours. No results for  input(s): AMMONIA in the last 168 hours. CBC: Recent Labs  Lab 04/24/19 1453 04/25/19 0318  WBC 9.9 9.3  HGB 15.2 13.7  HCT 44.7 41.5  MCV 89.6 92.0  PLT 251 256   Cardiac Enzymes: No results for input(s): CKTOTAL, CKMB, CKMBINDEX, TROPONINI in the last 168 hours. BNP: BNP (last 3 results) No results for input(s): BNP in the last 8760 hours.  ProBNP (last 3 results) No results for input(s): PROBNP in the last 8760 hours.  CBG: No results for input(s): GLUCAP in the last 168 hours.     Signed:  Irine Seal MD.  [facility] 04/25/2019, 3:37 PM

## 2019-04-25 NOTE — Progress Notes (Signed)
  Echocardiogram 2D Echocardiogram has been performed.  [name] 04/25/2019, 11:00 AM

## 2019-04-25 NOTE — Progress Notes (Signed)
PROGRESS NOTE    [name]  [id number] DOB: 12-Jul-1945 DOA: 04/24/2019 PCP: Tonia Ghent, MD follow-up   Brief Narrative:  HPI per Dr. Jonnie Finner The patient is a 74 y.o. year-old with hx of HTN, DJD presented w/ presyncope to ED.  In ED had HR 150's, no fever, RR 16, BP's up. Hx HTN on norvasc.  Pt just had back surgery recently.  Hx of afib in the past one time after a different surgery.  Hx Parkinson's. Hx chronic pain on oxycodone which he stopped recently a few days ago.  Initial EKG not able to tell SVT vs afib.  In ED pt rec'd 1mg  IV dilaudid for poss withdrawal and HR improved briefly then increased again and repeat EKG reportedly looked more like atrial fib so now is to be getting IV diltiazem bolus and drip. Asked to see for admission.   Pt seen in ED, he is feeling much better now w/ HR's down to 90's from 150's. Pt states he had back surg L5-S1 about 3 wks ago, taking pain pills postop became very constipated and did not have a BM for 2-3 wks, so he stopped his narcotics 2 days ago and took Senna 2 at night and finally had a BM yesterday.  Today he felt lightheaded, no palpitation or CP, did have SOB but not severe.  No hx afib, MI, CP, heart problems.  Recent hx of parkinson's w/ RUE tremors started about 6 mos ago.    Pt is active, likes to go to the beach w/ his wife and they ride ebikes up to 40 miles up and down the beach.  No tob.      Assessment & Plan:   Principal Problem:   Atrial fibrillation with RVR (HCC) Active Problems:   RBBB   HTN (hypertension)   History of recent back surgery   1 new onset paroxysmal A. fib with RVR Questionable etiology.  Patient with no prior history of coronary artery disease however has a history of hypertension and noted right bundle branch block on EKG.  Patient was placed on IV Cardizem and cardiology consulted.  2D echo pending.  Patient with clinical improvement.  Patient back in normal sinus rhythm.  Patient  currently on propranolol.  Xarelto for anticoagulation.  Per cardiology.  2.  Hypertension Continue Norvasc and propranolol.  3.  Parkinson's disease Not on any medication at this time.  Patient started on propranolol for rate control which will help with tremors of Parkinson's.  Outpatient follow-up with PCP and neurology.  4.  Recent L/S back surgery Patient took himself of pain medications due to constipation.  Currently denies any acute pain.  Outpatient follow-up.   DVT prophylaxis: Xarelto Code Status: Full Family Communication: Updated patient.  No family at bedside. Disposition Plan: Home when cleared by cardiology.   Consultants:   Cardiology: Dr. Einar Gip 04/24/2019  Procedures:   Chest x-ray 04/24/2019  2D echo pending  Antimicrobials:  None   Subjective: Patient sitting up at bedside eating his breakfast.  Denies any chest pain or shortness of breath.  States fatigue improving.  Feeling better than on admission.  Denies any palpitations.  Currently in sinus rhythm.  Objective: Vitals:   04/24/19 2053 04/25/19 0019 04/25/19 0446 04/25/19 0456  BP: (!) 158/110 118/76 (!) 139/94   Pulse: 92  65   Resp: 19  18   Temp: 98 F (36.7 C)  97.9 F (36.6 C)   TempSrc: Oral  Oral  SpO2: 94%  98%   Weight: 93.4 kg   93.3 kg  Height: 6\' 3"  (1.905 m)       Intake/Output Summary (Last 24 hours) at 04/25/2019 0933 Last data filed at 04/25/2019 0900 Gross per 24 hour  Intake 478 ml  Output 1825 ml  Net -1347 ml   Filed Weights   04/24/19 1822 04/24/19 2053 04/25/19 0456  Weight: 97.1 kg 93.4 kg 93.3 kg    Examination:  General exam: Appears calm and comfortable  Respiratory system: Clear to auscultation. Respiratory effort normal. Cardiovascular system: S1 & S2 heard, RRR. No JVD, murmurs, rubs, gallops or clicks. No pedal edema. Gastrointestinal system: Abdomen is nondistended, soft and nontender. No organomegaly or masses felt. Normal bowel sounds heard. Central  nervous system: Alert and oriented. No focal neurological deficits. Extremities: Right upper extremity tremors.  Symmetric 5 x 5 power. Skin: No rashes, lesions or ulcers Psychiatry: Judgement and insight appear normal. Mood & affect appropriate.     Data Reviewed: I have personally reviewed following labs and imaging studies  CBC: Recent Labs  Lab 04/24/19 1453 04/25/19 0318  WBC 9.9 9.3  HGB 15.2 13.7  HCT 44.7 41.5  MCV 89.6 92.0  PLT 251 [id number]   Basic Metabolic Panel: Recent Labs  Lab 04/24/19 1453 04/25/19 0318  NA 133* 136  K 4.0 4.1  CL 100 100  CO2 20* 26  GLUCOSE 130* 96  BUN 10 10  CREATININE 1.23 1.12  CALCIUM 9.8 8.8*   GFR: Estimated Creatinine Clearance: 71.3 mL/min (by C-G formula based on SCr of 1.12 mg/dL). Liver Function Tests: No results for input(s): AST, ALT, ALKPHOS, BILITOT, PROT, ALBUMIN in the last 168 hours. No results for input(s): LIPASE, AMYLASE in the last 168 hours. No results for input(s): AMMONIA in the last 168 hours. Coagulation Profile: No results for input(s): INR, PROTIME in the last 168 hours. Cardiac Enzymes: No results for input(s): CKTOTAL, CKMB, CKMBINDEX, TROPONINI in the last 168 hours. BNP (last 3 results) No results for input(s): PROBNP in the last 8760 hours. HbA1C: No results for input(s): HGBA1C in the last 72 hours. CBG: No results for input(s): GLUCAP in the last 168 hours. Lipid Profile: No results for input(s): CHOL, HDL, LDLCALC, TRIG, CHOLHDL, LDLDIRECT in the last 72 hours. Thyroid Function Tests: No results for input(s): TSH, T4TOTAL, FREET4, T3FREE, THYROIDAB in the last 72 hours. Anemia Panel: No results for input(s): VITAMINB12, FOLATE, FERRITIN, TIBC, IRON, RETICCTPCT in the last 72 hours. Sepsis Labs: No results for input(s): PROCALCITON, LATICACIDVEN in the last 168 hours.  Recent Results (from the past 240 hour(s))  SARS CORONAVIRUS 2 (TAT 6-24 HRS) Nasopharyngeal Nasopharyngeal Swab     Status:  None   Collection Time: 04/24/19  6:48 PM   Specimen: Nasopharyngeal Swab  Result Value Ref Range Status   SARS Coronavirus 2 NEGATIVE NEGATIVE Final    Comment: (NOTE) SARS-CoV-2 target nucleic acids are NOT DETECTED. The SARS-CoV-2 RNA is generally detectable in upper and lower respiratory specimens during the acute phase of infection. Negative results do not preclude SARS-CoV-2 infection, do not rule out co-infections with other pathogens, and should not be used as the sole basis for treatment or other patient management decisions. Negative results must be combined with clinical observations, patient history, and epidemiological information. The expected result is Negative. Fact Sheet for Patients: SugarRoll.be Fact Sheet for Healthcare Providers: https://www.woods-mathews.com/ This test is not yet approved or cleared by the [place] FDA and  has been  authorized for detection and/or diagnosis of SARS-CoV-2 by FDA under an Emergency Use Authorization (EUA). This EUA will remain  in effect (meaning this test can be used) for the duration of the COVID-19 declaration under Section 56 4(b)(1) of the Act, 21 U.S.C. section 360bbb-3(b)(1), unless the authorization is terminated or revoked sooner. Performed at [facility], [place] [address] [place], [place] [address]          Radiology Studies: DG Chest Port 1 View  Result Date: 04/24/2019 CLINICAL DATA:  Chest pain.  Palpitations. EXAM: PORTABLE CHEST 1 VIEW COMPARISON:  12/20/2014. FINDINGS: Mediastinum and hilar structures normal. Heart size normal. Lungs are clear. No pleural effusion or pneumothorax. Prior cervical spine fusion. Degenerative change thoracic spine. IMPRESSION: No acute cardiopulmonary disease. Electronically Signed   By: [place]   On: 04/24/2019 15:43        Scheduled Meds: . amLODipine  5 mg Oral Daily  . cholecalciferol  5,000 Units Oral Daily    . docusate sodium  100 mg Oral BID  . multivitamin with minerals  1 tablet Oral Daily  . propranolol ER  120 mg Oral Daily  . rivaroxaban  20 mg Oral Q supper  . senna-docusate  2 tablet Oral QHS  . sodium chloride flush  3 mL Intravenous Once  . sodium chloride flush  3 mL Intravenous Q12H  . zinc sulfate  220 mg Oral Daily   Continuous Infusions: . sodium chloride 75 mL/hr at 04/25/19 0130  . sodium chloride    . diltiazem (CARDIZEM) infusion 5 mg/hr (04/24/19 1705)     LOS: 1 day    Time spent: 35 minutes    Irine Seal, MD [facility]  If 7PM-7AM, please contact night-coverage [email or web address] 04/25/2019, 9:33 AM

## 2019-04-25 NOTE — Progress Notes (Signed)
Echo structurally normal. Low suspicion for CAD or ischemic.  Start Flecainide for AF and I will f/u in 10 days in office. Okay to discharge.    JG

## 2019-04-25 NOTE — Progress Notes (Signed)
Subjective:  Feels much improved with regard to symptoms of fatigue and palpitations.  Intake/Output from previous day:  I/O last 3 completed shifts: In: 51 [P.O.:478] Out: 1550 [WNUUV:2536] Total I/O In: -  Out: 275 [Urine:275]  Blood pressure (!) 139/94, pulse 65, temperature 97.9 F (36.6 C), temperature source Oral, resp. rate 18, height '6\' 3"'$  (1.905 m), weight 93.3 kg, SpO2 98 %. Physical Exam  Constitutional: He is oriented to person, place, and time. He appears well-developed and well-nourished.  Neck: No thyromegaly present.  Cardiovascular: Normal rate, regular rhythm, normal heart sounds and intact distal pulses. Exam reveals no gallop.  No murmur heard. No leg edema, no JVD.  Pulmonary/Chest: Effort normal and breath sounds normal.  Abdominal: Soft. Bowel sounds are normal.  Musculoskeletal:        General: Normal range of motion.     Cervical back: Neck supple.  Neurological: He is alert and oriented to person, place, and time.  Skin: Skin is warm and dry.    Lab Results: BMP BNP (last 3 results) No results for input(s): BNP in the last 8760 hours.  ProBNP (last 3 results) No results for input(s): PROBNP in the last 8760 hours. BMP Latest Ref Rng & Units 04/25/2019 04/24/2019 04/01/2019  Glucose 70 - 99 mg/dL 96 130(H) 120(H)  BUN 8 - 23 mg/dL '10 10 13  '$ Creatinine 0.61 - 1.24 mg/dL 1.12 1.23 1.38(H)  BUN/Creat Ratio 6 - 22 (calc) - - -  Sodium 135 - 145 mmol/L 136 133(L) 138  Potassium 3.5 - 5.1 mmol/L 4.1 4.0 4.2  Chloride 98 - 111 mmol/L 100 100 101  CO2 22 - 32 mmol/L 26 20(L) 28  Calcium 8.9 - 10.3 mg/dL 8.8(L) 9.8 8.9   Hepatic Function Latest Ref Rng & Units 01/14/2019 04/11/2017 11/23/2015  Total Protein 6.1 - 8.1 g/dL 7.1 7.4 7.2  Albumin 3.5 - 5.2 g/dL - 4.6 4.1  AST 10 - 35 U/L '17 18 17  '$ ALT 9 - 46 U/L '20 24 19  '$ Alk Phosphatase 39 - 117 U/L - 66 59  Total Bilirubin 0.2 - 1.2 mg/dL 0.8 0.9 0.5  Bilirubin, Direct 0.0 - 0.3 mg/dL - - -   CBC Latest  Ref Rng & Units 04/25/2019 04/24/2019 04/01/2019  WBC 4.0 - 10.5 K/uL 9.3 9.9 11.0(H)  Hemoglobin 13.0 - 17.0 g/dL 13.7 15.2 13.8  Hematocrit 39.0 - 52.0 % 41.5 44.7 40.9  Platelets 150 - 400 K/uL 256 251 172   Lipid Panel     Component Value Date/Time   CHOL 132 04/11/2017 0936   TRIG 111.0 04/11/2017 0936   HDL 30.40 (L) 04/11/2017 0936   CHOLHDL 4 04/11/2017 0936   VLDL 22.2 04/11/2017 0936   LDLCALC 79 04/11/2017 0936   Cardiac Panel  HS trop x1 negative  HEMOGLOBIN A1C No results found for: HGBA1C, MPG TSH Recent Labs    01/14/19 0958  TSH 4.39   Imaging:DG Chest Port 1 View  Result Date: 04/24/2019 CLINICAL DATA:  Chest pain.  Palpitations. EXAM: PORTABLE CHEST 1 VIEW COMPARISON:  12/20/2014. FINDINGS: Mediastinum and hilar structures normal. Heart size normal. Lungs are clear. No pleural effusion or pneumothorax. Prior cervical spine fusion. Degenerative change thoracic spine. IMPRESSION: No acute cardiopulmonary disease. Electronically Signed   By: Marcello Moores  Register   On: 04/24/2019 15:43   Cardiac Studies:  Echo pending   No results found for this or any previous visit (from the past 43800 hour(s)).  Scheduled Meds: . amLODipine  5  mg Oral Daily  . cholecalciferol  5,000 Units Oral Daily  . docusate sodium  100 mg Oral BID  . multivitamin with minerals  1 tablet Oral Daily  . propranolol ER  120 mg Oral Daily  . rivaroxaban  20 mg Oral Q supper  . senna-docusate  2 tablet Oral QHS  . sodium chloride flush  3 mL Intravenous Once  . sodium chloride flush  3 mL Intravenous Q12H  . zinc sulfate  220 mg Oral Daily   Continuous Infusions: . sodium chloride 75 mL/hr at 04/25/19 0130  . sodium chloride    . diltiazem (CARDIZEM) infusion 5 mg/hr (04/24/19 1705)   PRN Meds:.sodium chloride, acetaminophen **OR** acetaminophen, bisacodyl, cyclobenzaprine, HYDROcodone-acetaminophen, ondansetron **OR** ondansetron (ZOFRAN) IV, sodium chloride flush,  traMADol  Assessment/Plan:  1.  Paroxysmal atrial fibrillation with rapid ventricular response and paroxysmal atypical atrial flutter  EKG 04/24/2019: Atrial flutter with 2: 1 conduction at the rate of 147 bpm.  Rightward axis.  Right bundle branch block.  No evidence of ischemia.  Compared to the study done on 03/27/2019, sinus rhythm is now replaced by atrial flutter.  Telemetry 04/24/2019: Paroxysmal episodes of atrial fibrillation with RVR.  Patient flips back into a flutter.   Telemetry 04/25/2019: Course atrial fibrillation paroxysmal episodes with RVR, mostly maintaining sinus rhythm.  2.  Hypertension  Recommendation: Patient blood pressure is still uncontrolled, increase amlodipine to 10 mg daily, continue propranolol ER 120 mg daily. Patient's blood pressure is uncontrolled, will also add ARB with HCTZ.  Await echocardiogram, if essentially normal echocardiogram, I will start him on flecainide as my suspicion for CAD is low.  I will make my final recommendation prior to discharge once echocardiogram is done.   Adrian Prows, M.D. 04/25/2019, 9:51 AM [facility] Pager: [phone] Office: [phone] If no answer: [phone]

## 2019-04-28 ENCOUNTER — Telehealth: Payer: Self-pay

## 2019-04-28 NOTE — Telephone Encounter (Signed)
Noted. Thanks.

## 2019-04-28 NOTE — Telephone Encounter (Signed)
Transition Care Management Follow-up Telephone Call  Date of discharge and from where: 04/25/2019, [name]   How have you been since you were released from the hospital? Patient states that he is doing much better. No symptoms noted at this time.   Any questions or concerns? No   Items Reviewed:  Did the pt receive and understand the discharge instructions provided? Yes   Medications obtained and verified? Yes   Any new allergies since your discharge? No   Dietary orders reviewed? Yes  Do you have support at home? Yes   Functional Questionnaire: (I = Independent and D = Dependent) ADLs: I  Bathing/Dressing- I  Meal Prep- I  Eating- I  Maintaining continence- I  Transferring/Ambulation- I  Managing Meds- I  Follow up appointments reviewed:   PCP Hospital f/u appt confirmed? Yes  Scheduled to see Dr. Damita Dunnings on 05/02/2019 @ 9:45 am.  [facility] f/u appt confirmed? Yes  Scheduled to see Dr. Adrian Prows on 05/01/2019 @ 4 pm.  Are transportation arrangements needed? No   If their condition worsens, is the pt aware to call PCP or go to the Emergency Dept.? Yes  Was the patient provided with contact information for the PCP's office or ED? Yes  Was to pt encouraged to call back with questions or concerns? Yes

## 2019-04-30 ENCOUNTER — Telehealth: Payer: Self-pay

## 2019-04-30 DIAGNOSIS — F5101 Primary insomnia: Secondary | ICD-10-CM

## 2019-04-30 NOTE — Telephone Encounter (Signed)
He should wait until seen by [place]

## 2019-04-30 NOTE — Telephone Encounter (Signed)
Telephone encounter:  Reason for call: Pt called and c/o hr being 60 and him feeling fatigue and no energy and he says they started him on flecainide, hctz, losartan and propanolol and he thinks one of them are contributing to it; He had a appt for tomorrow but the office canceled it per you didn't have time to see him; Please advise   Usual provider: Lavone Nian  Last office visit:   Next office visit: 05/09/2019   Last hospitalization: 04/24/2019   Current Outpatient Medications on File Prior to Visit  Medication Sig Dispense Refill  . amLODipine (NORVASC) 10 MG tablet Take 1 tablet (10 mg total) by mouth as needed. Please ask patient to make an appointment for physical exam prior to further refills. 30 tablet 1  . bisacodyl (DULCOLAX) 5 MG EC tablet Take 1 tablet (5 mg total) by mouth daily as needed for moderate constipation. 20 tablet 0  . Cholecalciferol (VITAMIN D) 125 MCG (5000 UT) CAPS Take 5,000 Units by mouth daily.    . cyclobenzaprine (FLEXERIL) 10 MG tablet Take 0.5-1 tablets (5-10 mg total) by mouth 3 (three) times daily as needed for muscle spasms. 50 tablet 0  . Echinacea 450 MG CAPS Take 450 mg by mouth daily.    . flecainide (TAMBOCOR) 50 MG tablet Take 1 tablet (50 mg total) by mouth 2 (two) times daily. 60 tablet 1  . hydrochlorothiazide (MICROZIDE) 12.5 MG capsule Take 1 capsule (12.5 mg total) by mouth daily. 30 capsule 1  . loratadine (CLARITIN) 10 MG tablet Take 10 mg by mouth daily.    Marland Kitchen losartan (COZAAR) 50 MG tablet Take 1 tablet (50 mg total) by mouth daily. 30 tablet 1  . Menthol, Topical Analgesic, (BIOFREEZE EX) Apply 1 application topically daily as needed (pain).    . Multiple Vitamin (MULTIVITAMIN WITH MINERALS) TABS tablet Take 1 tablet by mouth daily.     . ondansetron (ZOFRAN) 4 MG tablet Take 1 tablet (4 mg total) by mouth every 6 (six) hours as needed for nausea or vomiting. 20 tablet 0  . oxyCODONE (OXY IR/ROXICODONE) 5 MG immediate release tablet Take 1 tablet  (5 mg total) by mouth every 3 (three) hours as needed for moderate pain ((score 4 to 6)). 30 tablet 0  . propranolol ER (INDERAL LA) 120 MG 24 hr capsule Take 1 capsule (120 mg total) by mouth daily. 30 capsule 1  . rivaroxaban (XARELTO) 20 MG TABS tablet Take 1 tablet (20 mg total) by mouth daily with supper. 30 tablet 1  . senna-docusate (SENOKOT-S) 8.6-50 MG tablet Take 1 tablet by mouth 2 (two) times daily.    . traMADol (ULTRAM) 50 MG tablet Take 50 mg by mouth every 6 (six) hours as needed for moderate pain.    Marland Kitchen zinc gluconate 50 MG tablet Take 50 mg by mouth daily.     No current facility-administered medications on file prior to visit.

## 2019-05-01 ENCOUNTER — Ambulatory Visit: Payer: Self-pay | Admitting: Cardiology

## 2019-05-01 NOTE — Telephone Encounter (Signed)
He should try Zyquil or I can send for Ambien for a week supply

## 2019-05-01 NOTE — Telephone Encounter (Signed)
Pt aware and he says that's fine he can wait; Pt asks if there is anything that you can suggest that he use for sleep he has not been sleeping he says that its really hard for him to fall asleep; he already uses melatonin but that's not working

## 2019-05-02 ENCOUNTER — Other Ambulatory Visit: Payer: Self-pay

## 2019-05-02 ENCOUNTER — Encounter: Payer: Self-pay | Admitting: Family Medicine

## 2019-05-02 ENCOUNTER — Telehealth: Payer: Self-pay

## 2019-05-02 ENCOUNTER — Ambulatory Visit (INDEPENDENT_AMBULATORY_CARE_PROVIDER_SITE_OTHER): Payer: PPO | Admitting: Family Medicine

## 2019-05-02 VITALS — BP 138/82 | HR 60 | Temp 97.6°F | Ht 75.0 in | Wt 207.0 lb

## 2019-05-02 DIAGNOSIS — G20 Parkinson's disease: Secondary | ICD-10-CM | POA: Diagnosis not present

## 2019-05-02 DIAGNOSIS — M545 Low back pain, unspecified: Secondary | ICD-10-CM

## 2019-05-02 DIAGNOSIS — R399 Unspecified symptoms and signs involving the genitourinary system: Secondary | ICD-10-CM | POA: Diagnosis not present

## 2019-05-02 DIAGNOSIS — I4891 Unspecified atrial fibrillation: Secondary | ICD-10-CM | POA: Diagnosis not present

## 2019-05-02 DIAGNOSIS — R300 Dysuria: Secondary | ICD-10-CM

## 2019-05-02 LAB — BASIC METABOLIC PANEL
BUN: 12 mg/dL (ref 6–23)
CO2: 28 mEq/L (ref 19–32)
Calcium: 9.2 mg/dL (ref 8.4–10.5)
Chloride: 98 mEq/L (ref 96–112)
Creatinine, Ser: 1.2 mg/dL (ref 0.40–1.50)
GFR: 59.37 mL/min — ABNORMAL LOW (ref >60.00)
Glucose, Bld: 107 mg/dL — ABNORMAL HIGH (ref 70–99)
Potassium: 4.4 mEq/L (ref 3.5–5.1)
Sodium: 135 mEq/L (ref 135–145)

## 2019-05-02 LAB — CBC WITH DIFFERENTIAL/PLATELET
Basophils Absolute: 0.1 10*3/uL (ref 0.0–0.1)
Basophils Relative: 0.6 % (ref 0.0–3.0)
Eosinophils Absolute: 0.2 10*3/uL (ref 0.0–0.7)
Eosinophils Relative: 1.9 % (ref 0.0–5.0)
HCT: 43.7 % (ref 39.0–52.0)
Hemoglobin: 14.6 g/dL (ref 13.0–17.0)
Lymphocytes Relative: 17.5 % (ref 12.0–46.0)
Lymphs Abs: 1.6 10*3/uL (ref 0.7–4.0)
MCHC: 33.4 g/dL (ref 30.0–36.0)
MCV: 90.9 fl (ref 78.0–100.0)
Monocytes Absolute: 1 10*3/uL (ref 0.1–1.0)
Monocytes Relative: 10.5 % (ref 3.0–12.0)
Neutro Abs: 6.4 10*3/uL (ref 1.4–7.7)
Neutrophils Relative %: 69.5 % (ref 43.0–77.0)
Platelets: 259 10*3/uL (ref 150.0–400.0)
RBC: 4.81 Mil/uL (ref 4.22–5.81)
RDW: 13.1 % (ref 11.5–15.5)
WBC: 9.2 10*3/uL (ref 4.0–10.5)

## 2019-05-02 LAB — URINALYSIS, ROUTINE W REFLEX MICROSCOPIC
Bilirubin Urine: NEGATIVE
Hgb urine dipstick: NEGATIVE
Ketones, ur: NEGATIVE
Leukocytes,Ua: NEGATIVE
Nitrite: NEGATIVE
RBC / HPF: NONE SEEN (ref 0–?)
Specific Gravity, Urine: 1.02 (ref 1.000–1.030)
Total Protein, Urine: NEGATIVE
Urine Glucose: NEGATIVE
Urobilinogen, UA: 0.2 (ref 0.0–1.0)
WBC, UA: NONE SEEN (ref 0–?)
pH: 7 (ref 5.0–8.0)

## 2019-05-02 MED ORDER — ZOLPIDEM TARTRATE 5 MG PO TABS: 5.0000 mg | ORAL_TABLET | Freq: Every evening | ORAL | 0 refills | Status: DC | PRN

## 2019-05-02 MED ORDER — METHOCARBAMOL 500 MG PO TABS: 500.0000 mg | ORAL_TABLET | Freq: Four times a day (QID) | ORAL | 1 refills | Status: DC | PRN

## 2019-05-02 NOTE — Progress Notes (Addendum)
This visit occurred during the SARS-CoV-2 public health emergency.  Safety protocols were in place, including screening questions prior to the visit, additional usage of staff PPE, and extensive cleaning of exam room while observing appropriate contact time as indicated for disinfecting solutions.  ========================= Admit date: 04/24/2019 Discharge date: 04/25/2019  Time spent: 55 minutes  Recommendations for Outpatient Follow-up:  1. Follow-up with Dr. Einar Gip, 05/01/2019. 2. Follow-up with Tonia Ghent, MD in 2 weeks.  On follow-up patient will need a basic metabolic profile done to follow-up on electrolytes and renal function.  Patient blood pressure needs to be reassessed as patient's antihypertensive medications have been adjusted and ARB as well as HCTZ and propranolol added to patient's regimen.   Discharge Diagnoses:  Principal Problem:   Atrial fibrillation with RVR ([place]) Active Problems:   RBBB   HTN (hypertension)   History of recent back surgery   Dementia due to Parkinson's disease without behavioral disturbance [facility])   Discharge Condition: Stable and improved  Diet recommendation: Heart healthy       Filed Weights   04/24/19 1822 04/24/19 2053 04/25/19 0456  Weight: 97.1 kg 93.4 kg 93.3 kg    History of present illness:  HPI per Dr. Jonnie Finner The patient is a73y.o. year-old with hx of HTN, DJD presented w/ presyncope to ED. In ED had HR 150's, no fever, RR 16, BP's up. Hx HTN on norvasc. Pt just had back surgery recently. Hx of afib in the past one time after a different surgery. Hx Parkinson's. Hx chronic pain on oxycodone which he stopped recently a few days ago. Initial EKG not able to tell SVT vs afib. In ED pt rec'd 1mg  IV dilaudid for poss withdrawal and HR improved briefly then increased again and repeat EKG reportedly looked more like atrial fib so now is to be getting IV diltiazem bolus and drip. Asked to see for admission.   Pt seen  in ED, he is feeling much better now w/ HR's down to 90's from 150's. Pt states he had back surg L5-S1 about 3 wks ago, taking pain pills postop became very constipated and did not have a BM for 2-3 wks, so he stopped his narcotics 2 days ago and took Senna 2 at night and finally had a BM yesterday. Today he felt lightheaded, no palpitation or CP, did have SOB but not severe. No hx afib, MI, CP, heart problems. Recent hx of parkinson's w/ RUE tremors started about 6 mos ago.   Pt is active, likes to go to the beach w/ his wife and they ride ebikes up to 40 miles up and down the beach. No tob.   Hospital Course:  1 new onset paroxysmal A. fib with RVR Questionable etiology. Patient with no prior history of coronary artery disease however has a history of hypertension and noted right bundle branch block on EKG with atrial flutter with 2:1 conduction. Patient was placed on IV Cardizem and cardiology consulted. 2D echo which was done had a EF of 60 to 65%, no LVH, no wall motion abnormalities.  Patient noted on telemetry to have paroxysmal episodes of A. fib with RVR and subsequently went back into sinus rhythm on day of discharge.  Patient was maintaining in sinus rhythm.  Patient was started on propranolol ER 120 mg daily for rate control and flecainide added per cardiology.  Patient was started on Xarelto for anticoagulation.  Close outpatient follow-up with cardiology 10 days post discharge.  Patient was discharged in stable  and improved condition.   2. Hypertension Patient noted to have uncontrolled blood pressure during the hospitalization.  Patient's Norvasc was increased to 10 mg daily and patient had started on propranolol ER 120 mg daily.  HCTZ and ARB were added per cardiology recommendations.  Blood pressure improved.  Outpatient follow-up.  3. Parkinson's disease Not on any medication at this time. Patient started on propranolol for rate control which will help with tremors of  Parkinson's. Outpatient follow-up with PCP and neurology.  4. Recent L/S back surgery Patient took himself of pain medications due to constipation.  Patient denied any acute pain during the hospitalization.  Outpatient follow-up.   Procedures:  Chest x-ray 04/24/2019  2D echo 04/25/2019  Consultations:  Cardiology: Dr. Einar Gip 04/24/2019  ========================= Events of the last few months discussed with patient.  He was diagnosed with Parkinson's disease.  He did not tolerate sinemet and is off treatment now.  Then he had lower back surgery.  His sister passed away and this was clearly stressful for the patient.  He didn't know how much of that contributed to the recent episode with A fib.  We talked about his previous diagnoses and his recent hospital course.  He didn't tolerate flexeril from constipation.  He uses zofran prn for motion sickness.    Episodically burning with urination. Slower flow off flomax.  Incomplete voiding.  Nocturia.   Noted since he had previous catheterization placed during surgery.  He is fatigued on current meds. Due for f/u labs.  No CP, not SOB.  No BLE edema.  No sensation of heart racing.  He doesn't have presyncope now (he did have that prev with elevated pulse).  Pathophysiology of atrial fibrillation discussed with patient.  He is only having to use amlodipine when his blood pressures elevation, not needed recently.  PMH and SH reviewed  ROS: Per HPI unless specifically indicated in ROS section   Meds, vitals, and allergies reviewed.   GEN: nad, alert and oriented HEENT: ncat NECK: supple w/o LA CV: Sounds to be RRR, not tachycardic. PULM: ctab, no inc wob ABD: soft, +bs EXT: no edema SKIN: no acute rash Parkinsonian tremor noted in the hands

## 2019-05-02 NOTE — Telephone Encounter (Signed)
-----   Message from Adrian Prows, MD sent at 04/26/2019  8:37 AM EST ----- Regarding: Please do TOC A. Fib

## 2019-05-02 NOTE — Addendum Note (Signed)
Addended by: Kela Millin on: 05/02/2019 09:43 PM   Modules accepted: Orders

## 2019-05-02 NOTE — Telephone Encounter (Signed)
Can you send in the Ambien

## 2019-05-02 NOTE — Telephone Encounter (Signed)
Location of hospitalization: [facility] Reason for hospitalization: A-Fib Date of discharge: 04/25/19 Date of first communication with patient: today Person contacting patient: Me Current symptoms: Not sleeping good Do you understand why you were in the Hospital: Yes Questions regarding discharge instructions: None Where were you discharged to: Home Medications reviewed: Yes Allergies reviewed: Yes Dietary changes reviewed: Yes. Discussed low fat and low salt diet.  Referals reviewed: NA Activities of Daily Living: Able to with mild limitations Any transportation issues/concerns: None Any patient concerns: None Confirmed importance & date/time of Follow up appt: Yes 05/09/19 Confirmed with patient if condition begins to worsen call. Pt was given the office number and encouraged to call back with questions or concerns: Yes

## 2019-05-02 NOTE — Patient Instructions (Signed)
Go to the lab on the way out.  We'll contact you with your lab report.  Try methocarbamol for muscle pain.   See if that helps.  It shouldn't cause constipation as much as flexeril.   I'll await the notes from cardiology and neurology.  Take care.  Glad to see you.

## 2019-05-02 NOTE — Telephone Encounter (Signed)
He should discuss with his PCP

## 2019-05-04 ENCOUNTER — Encounter: Payer: Self-pay | Admitting: Family Medicine

## 2019-05-04 DIAGNOSIS — G20 Parkinson's disease: Secondary | ICD-10-CM | POA: Insufficient documentation

## 2019-05-04 NOTE — Assessment & Plan Note (Signed)
He did not tolerate Sinemet.  Parkinson's pathophysiology discussed with patient.  He will follow-up with neurology.  I appreciate the help from Dr. Carles Collet.

## 2019-05-04 NOTE — Assessment & Plan Note (Signed)
See notes on labs.  He could have irritation without frank infection. At least 30 minutes were devoted to patient care in this encounter (this can potentially include time spent reviewing the patient's file/history, interviewing and examining the patient, counseling/reviewing plan with patient, ordering referrals, ordering tests, reviewing relevant laboratory or x-ray data, and documenting the encounter).

## 2019-05-04 NOTE — Assessment & Plan Note (Signed)
A. fib pathophysiology discussed with patient.  He sounds to be in regular rate and rhythm right now.  Recheck labs pending.  Continue anticoagulation for now.  He is going to follow-up with cardiology.

## 2019-05-04 NOTE — Assessment & Plan Note (Addendum)
Improved after previous surgery.  He can try methocarbamol for residual muscle pain.

## 2019-05-06 ENCOUNTER — Telehealth: Payer: Self-pay | Admitting: Cardiology

## 2019-05-06 NOTE — Telephone Encounter (Signed)
Can you read my above message and give advice; Pt doesn't want to take anything for sleep

## 2019-05-06 NOTE — Telephone Encounter (Signed)
Will be seeing him in 3 days

## 2019-05-06 NOTE — Telephone Encounter (Signed)
Pt called and he is c/o nausea, no appetite and cant sleep ever since he has gotten out of the hospital; his bp is good he went to pcp and thye said everything lookied fine and he is concerned  Can you help he comes in this Friday for hosp f/u

## 2019-05-06 NOTE — Progress Notes (Signed)
[name] was seen today in follow up for Parkinsons disease.  Patient was diagnosed last visit and started on levodopa.  Patient reports that he is not taking the medication.  Reports that he took it for 3 months.  Pt states that it caused CP and insomnia.  my previous records were reviewed prior to todays visit. Pt denies falls.  Pt denies lightheadedness, near syncope.  No hallucinations.  Mood has been good.  Medical records have been reviewed since our last visit.  Patient saw primary care on January 15.  This was from her recent hospitalization stay for new onset atrial fibrillation.  Patient was started on Xarelto.  States today that tremor is driving him crazy.  Having trouble getting to sleep because of it.  Asks about sleep difficulties.  Is on Ambien.  Current prescribed movement disorder medications: Carbidopa/levodopa 25/100, 1 tablet 3 times per day is prescribed, but patient not currently taking    ALLERGIES:   Allergies  Allergen Reactions  . Flecainide     Vomit   . Flexeril [Cyclobenzaprine]     Constipation  . Morphine And Related Hives    Hallucinations- can tolerate oxycodone.   . Sinemet [Carbidopa-Levodopa]     intolerant    CURRENT MEDICATIONS:  Outpatient Encounter Medications as of 05/12/2019  Medication Sig  . Cholecalciferol (VITAMIN D) 125 MCG (5000 UT) CAPS Take 5,000 Units by mouth daily.  Marland Kitchen diltiazem (CARDIZEM CD) 180 MG 24 hr capsule Take 1 capsule (180 mg total) by mouth daily.  . hydrochlorothiazide (MICROZIDE) 12.5 MG capsule Take 1 capsule (12.5 mg total) by mouth daily.  Marland Kitchen losartan (COZAAR) 50 MG tablet Take 1 tablet (50 mg total) by mouth daily.  . Menthol, Topical Analgesic, (BIOFREEZE EX) Apply 1 application topically daily as needed (pain).  . Multiple Vitamin (MULTIVITAMIN WITH MINERALS) TABS tablet Take 1 tablet by mouth daily.   . ondansetron (ZOFRAN) 4 MG tablet Take 1 tablet (4 mg total) by mouth every 6 (six) hours as needed  for nausea or vomiting.  . promethazine (PHENERGAN) 12.5 MG tablet Take 1 tablet (12.5 mg total) by mouth every 8 (eight) hours as needed for nausea or vomiting (Sedation caution).  . rivaroxaban (XARELTO) 20 MG TABS tablet Take 1 tablet (20 mg total) by mouth daily with supper.  . zinc gluconate 50 MG tablet Take 50 mg by mouth daily.  Marland Kitchen zolpidem (AMBIEN) 5 MG tablet Take 1 tablet (5 mg total) by mouth at bedtime as needed for sleep.  . [DISCONTINUED] amLODipine (NORVASC) 10 MG tablet Take 1 tablet (10 mg total) by mouth as needed. Please ask patient to make an appointment for physical exam prior to further refills. (Patient not taking: Reported on 05/02/2019)  . [DISCONTINUED] bisacodyl (DULCOLAX) 5 MG EC tablet Take 1 tablet (5 mg total) by mouth daily as needed for moderate constipation.  . [DISCONTINUED] Echinacea 450 MG CAPS Take 450 mg by mouth daily.  . [DISCONTINUED] flecainide (TAMBOCOR) 50 MG tablet Take 1 tablet (50 mg total) by mouth 2 (two) times daily.  . [DISCONTINUED] methocarbamol (ROBAXIN) 500 MG tablet Take 1 tablet (500 mg total) by mouth every 6 (six) hours as needed for muscle spasms.  . [DISCONTINUED] propranolol ER (INDERAL LA) 120 MG 24 hr capsule Take 1 capsule (120 mg total) by mouth daily.  . [DISCONTINUED] senna-docusate (SENOKOT-S) 8.6-50 MG tablet Take 1 tablet by mouth 2 (two) times daily.   No facility-administered encounter medications on file as  of 05/12/2019.    PHYSICAL EXAMINATION:    VITALS:   Vitals:   05/12/19 1416  BP: (!) 154/88  Pulse: (!) 105  Resp: 18  SpO2: 97%  Weight: 208 lb 12.8 oz (94.7 kg)  Height: 6\' 3"  (1.905 m)    GEN:  The patient appears stated age and is in NAD. HEENT:  Normocephalic, atraumatic.  The mucous membranes are moist. The superficial temporal arteries are without ropiness or tenderness. CV:  RRR Lungs:  CTAB Neck/HEME:  There are no carotid bruits bilaterally.  Neurological examination:  Orientation: The patient  is alert and oriented x3. Cranial nerves: There is good facial symmetry with facial hypomimia. The speech is fluent and clear. Soft palate rises symmetrically and there is no tongue deviation. Hearing is intact to conversational tone. Sensation: Sensation is intact to light touch throughout Motor: Strength is at least antigravity x4.  Movement examination: Tone: There is mild increased tone in the RUE Abnormal movements: there is RUE rest tremor Coordination:  There is  decremation with RAM's, with any form of RAMS, including alternating supination and pronation of the forearm, hand opening and closing, finger taps, heel taps and toe taps on the right Gait and Station: The patient has no difficulty arising out of a deep-seated chair without the use of the hands. The patient's stride length is good with decreased arm swing on the right.      I have reviewed and interpreted the following labs independently   Chemistry      Component Value Date/Time   NA 135 05/02/2019 1054   K 4.4 05/02/2019 1054   CL 98 05/02/2019 1054   CO2 28 05/02/2019 1054   BUN 12 05/02/2019 1054   CREATININE 1.20 05/02/2019 1054   CREATININE 1.32 (H) 01/14/2019 0958      Component Value Date/Time   CALCIUM 9.2 05/02/2019 1054   ALKPHOS 66 04/11/2017 0936   AST 17 01/14/2019 0958   ALT 20 01/14/2019 0958   BILITOT 0.8 01/14/2019 0958     Lab Results  Component Value Date   TSH 4.39 01/14/2019     ASSESSMENT/PLAN:  1.  Parkinson's disease  -Discussed retrying levodopa.  Discussed pramipexole.  After the discussion, ultimately decided to cautiously start pramipexole, Risks, benefits, side effects, including but not limited to sleep attacks, cognitive change, and compulsive behaviors were discussed.  The opportunity to ask questions was given and they were answered to the best of my ability.  The patient expressed understanding and willingness to follow the outlined treatment protocols.  -he thought that he  had SE with the carbidopa/levodopa 25/100.  Stated that he had palpitations and CP with it.  I suspect that he was having new onset afib and didn't know it as it was diagnosed not long thereafter.  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  This may be the case in him.  2.  Insomnia  -on ambien from pcp  -discussed using melatonin  3.  New onset atrial fibrillation  -Following with Dr. Einar Gip  -On Xarelto  4.  Nausea  -Discussed that I really do not want him using the Phenergan.  Does report that this was recently changed to Zofran.  I have no objection to this.  Sounds like he is using it primarily for carsickness.  Total time spent on today's visit was  30 minutes, including both face-to-face time and nonface-to-face time.  Time included that spent on review of records (  prior notes available to me/labs/imaging if pertinent), discussing treatment and goals, answering patient's questions and coordinating care.  Cc:  Tonia Ghent, MD

## 2019-05-06 NOTE — Telephone Encounter (Signed)
Not sure on the etiology for his symptoms. Can use OTC Melatonin or benadryl to see if that will help him sleep.

## 2019-05-07 ENCOUNTER — Telehealth: Payer: Self-pay | Admitting: Family Medicine

## 2019-05-07 ENCOUNTER — Telehealth: Payer: Self-pay

## 2019-05-07 MED ORDER — PROMETHAZINE HCL 12.5 MG PO TABS: 12.5000 mg | ORAL_TABLET | Freq: Three times a day (TID) | ORAL | 0 refills | Status: DC | PRN

## 2019-05-07 MED ORDER — FLECAINIDE ACETATE 50 MG PO TABS: ORAL_TABLET | ORAL | 1 refills | Status: DC

## 2019-05-07 NOTE — Telephone Encounter (Signed)
Agree to hold Flecainide. Will be seeing him in 1 day

## 2019-05-07 NOTE — Telephone Encounter (Signed)
Iam okay on holding Flecainide

## 2019-05-07 NOTE — Telephone Encounter (Signed)
I spoke with pt; pt saw Dr Damita Dunnings on 05/02/19 and at that time pt's nausea was on and off. On 05/05/19 Nausea has been consistent and pt having dry heaves. zofran does not help nausea. Pt trying to drink boost. Pt request different med for nausea. 05/07/19 pt does not have fever; T now is 97.5 and no chills. Pt having mid lower abd pain and pressure feeling like sick stomach and a gas pain;pt is bloated and pt is passing gas about every hour; pain level now 7-8. Last BM was this morning but was small BM. Pt taking miralax but pt thinks due to not eating. Pt is having weakness. No dry mouth or dizziness. Main complaint is nausea and no appetite. Pt said he is not in distress and then does not need to go to UC or ED. Pt thinks flecainide and propranolol are causing the nausea. Pt has not heard back from card yet. [facility]. UC & ED precautions given and pt voiced understanding. FYI to Dr Damita Dunnings.

## 2019-05-07 NOTE — Telephone Encounter (Signed)
Patient called today He stated that he has been having nausea for the last 5 days. Patient stated he is on 4 different heart medications and is not sure if this could be causing the upset stomach, Patient contacted his heart doctor and said they were unable to get him in today.  Patient is already taking Zofran for nausea but said this is not helping at all.  Patient is unable to eat without feeling sick to his stomach and doesn't have an appetite   He would like to know if there is something you can prescribe. Patient said he feels miserable and just wants some relief.   Please advise

## 2019-05-07 NOTE — Telephone Encounter (Signed)
Noted. Thanks.

## 2019-05-07 NOTE — Telephone Encounter (Signed)
Please triage patient to make sure he does not have any other alarming symptoms going on, especially since I did not recall the symptoms going on at the last office visit.  Please update me and we will go from there.  Thanks.

## 2019-05-07 NOTE — Telephone Encounter (Signed)
Pt notified as instructed and voiced understanding. Pt will wait to hear from cardiology. Pt will hold flecainide for now. If pt worsens during the day he will call [facility] but if on weekend or night pt will go to [facility] or ED. FYI to Dr Damita Dunnings.

## 2019-05-07 NOTE — Telephone Encounter (Signed)
Pt aware.

## 2019-05-07 NOTE — Telephone Encounter (Signed)
Pt states that he has been sick all morning throwing up and has not been able to take his meds he wants to let you know

## 2019-05-07 NOTE — Telephone Encounter (Signed)
I am going to route this note to cardiology as FYI and for input.  Rena- please call patient back If he is clearly worse in the meantime that I want him to get checked in person/get reevaluated. Hold the flecainide for now.  Unclear if that is the causative issue but I would make 1 change at a time. I would try Phenergan as needed for nausea in the meantime.  Prescription sent. Thank you.

## 2019-05-08 NOTE — Telephone Encounter (Signed)
See below, notify pt.  Thanks.

## 2019-05-08 NOTE — Telephone Encounter (Signed)
Noted.  Thanks.  Glad he feels better.

## 2019-05-08 NOTE — Telephone Encounter (Signed)
Pt notified as instructed and voiced understanding. Pt said this morning when first woke up was not nauseated. After pt drank coffee he was nauseated and took phenergan. Pt said he feels the best he has felt in few days. Pt will cb with update of condition first of wk unless has problems and will cb sooner. FYI to Dr Damita Dunnings.

## 2019-05-08 NOTE — Telephone Encounter (Signed)
Unable to reach patient but he comes in tomorrow

## 2019-05-09 ENCOUNTER — Ambulatory Visit: Payer: Self-pay | Admitting: Cardiology

## 2019-05-09 ENCOUNTER — Encounter: Payer: Self-pay | Admitting: Cardiology

## 2019-05-09 ENCOUNTER — Ambulatory Visit: Payer: PPO | Admitting: Cardiology

## 2019-05-09 ENCOUNTER — Other Ambulatory Visit: Payer: Self-pay

## 2019-05-09 VITALS — BP 138/82 | HR 57 | Ht 75.0 in | Wt 208.0 lb

## 2019-05-09 DIAGNOSIS — I480 Paroxysmal atrial fibrillation: Secondary | ICD-10-CM

## 2019-05-09 DIAGNOSIS — I10 Essential (primary) hypertension: Secondary | ICD-10-CM

## 2019-05-09 MED ORDER — DILTIAZEM HCL ER COATED BEADS 240 MG PO CP24: 240.0000 mg | ORAL_CAPSULE | Freq: Every day | ORAL | 2 refills | Status: DC

## 2019-05-09 MED ORDER — DILTIAZEM HCL ER COATED BEADS 180 MG PO CP24: 180.0000 mg | ORAL_CAPSULE | Freq: Every day | ORAL | 2 refills | Status: DC

## 2019-05-09 NOTE — Progress Notes (Signed)
Primary Physician/Referring:  Tonia Ghent, MD  Patient ID: [name], male    DOB: Sep 09, 1945, 74 y.o.   MRN: [id number]  Chief Complaint  Patient presents with  . Atrial Fibrillation   HPI:    [name]  is a 74 y.o. Caucasian male patientwith Hypertension, degenerative joint disease, admitted to the hospital on 05/01/2019 with A. fib with RVR.  Patient had presented with presyncope.  He was started on IV Cardizem, echocardiogram revealed normal LVEF.  No wall motion abnormalities.  He does have right bundle branch block. He was discharged on flecainide and propanolol as well as Xarelto for anticoagulation, he converted to sinus rhythm upon discharge. Patient had previously had A fib sometime in the past with previous surgery and had also had spinal surgery 3 weeks prior to his hospital admission.  He now presents for follow-up, he has discontinued flecainide and also discontinued propranolol 1 day ago due to severe fatigue, nausea and not feeling well.  States that since he stopped the medication, feels well.  Past Medical History:  Diagnosis Date  . Arthritis   . GERD (gastroesophageal reflux disease)   . H/O urinary frequency   . Heart murmur    as a child  . History of bronchitis   . HSV infection    oral  . Hypertension    improved after back pain treated with surgery  . Insomnia   . Joint pain   . OSA on CPAP    he ordered kit and started use 2017 w/o formal testing, see note from 11/30/15.   . Vertigo 10/13/2014   recurrent; trigger is cool air on left ear, improved with daily antihistamine   Past Surgical History:  Procedure Laterality Date  . ACHILLES TENDON REPAIR Left   . BACK SURGERY    . COLONOSCOPY  02/2011   Negative, GI  . DEBRIDEMENT AND CLOSURE WOUND Left 02/25/2018   Procedure: DEBRIDEMENT LEFT LEG APPLICATION INTEGRA;  Surgeon: Irene Limbo, MD;  Location: [facility];  Service: Plastics;  Laterality:  Left;  . EYE SURGERY Bilateral    cataracts  . KNEE ARTHROSCOPY     L  . LUMBAR FUSION     2016  . LUMBAR SPINE SURGERY  03/31/2019   exploration of lumbar fusion w/L5-S1 decompression redo L4-5 fusion  . SPINE SURGERY  2003   cervical fusion  . TONSILLECTOMY    . varicoelectomy     Social History   Tobacco Use  . Smoking status: Never Smoker  . Smokeless tobacco: Never Used  Substance Use Topics  . Alcohol use: No    ROS  Review of Systems  Constitution: Negative for weight gain.  Cardiovascular: Negative for dyspnea on exertion, leg swelling and syncope.  Respiratory: Negative for hemoptysis.   Endocrine: Negative for cold intolerance.  Hematologic/Lymphatic: Does not bruise/bleed easily.  Musculoskeletal: Positive for arthritis and back pain.  Gastrointestinal: Negative for hematochezia and melena.  Neurological: Positive for tremors. Negative for headaches and light-headedness.   Objective  Blood pressure 138/82, pulse (!) 57, height '6\' 3"'$  (1.905 m), weight 208 lb (94.3 kg), SpO2 99 %.  Vitals with BMI 05/09/2019 05/02/2019 04/25/2019  Height '6\' 3"'$  '6\' 3"'$  -  Weight 208 lbs 207 lbs -  BMI 26 83.09 -  Systolic 407 680 881  Diastolic 82 82 81  Pulse 57 60 63     Physical Exam  Neck: No thyromegaly present.  Cardiovascular: Normal rate, regular rhythm,  normal heart sounds and intact distal pulses. Exam reveals no gallop.  No murmur heard. No leg edema, no JVD.  Pulmonary/Chest: Effort normal and breath sounds normal.  Abdominal: Soft. Bowel sounds are normal.  Musculoskeletal:     Cervical back: Neck supple.  Skin: Skin is warm and dry.   Laboratory examination:   Recent Labs    04/01/19 0543 04/01/19 0543 04/24/19 1453 04/25/19 0318 05/02/19 1054  NA 138   < > 133* 136 135  K 4.2   < > 4.0 4.1 4.4  CL 101   < > 100 100 98  CO2 28   < > 20* 26 28  GLUCOSE 120*   < > 130* 96 107*  BUN 13   < > '10 10 12  '$ CREATININE 1.38*   < > 1.23 1.12 1.20  CALCIUM  8.9   < > 9.8 8.8* 9.2  GFRNONAA 51*  --  58* >60  --   GFRAA 59*  --  >60 >60  --    < > = values in this interval not displayed.   estimated creatinine clearance is 66.5 mL/min (by C-G formula based on SCr of 1.2 mg/dL).  CMP Latest Ref Rng & Units 05/02/2019 04/25/2019 04/24/2019  Glucose 70 - 99 mg/dL 107(H) 96 130(H)  BUN 6 - 23 mg/dL '12 10 10  '$ Creatinine 0.40 - 1.50 mg/dL 1.20 1.12 1.23  Sodium 135 - 145 mEq/L 135 136 133(L)  Potassium 3.5 - 5.1 mEq/L 4.4 4.1 4.0  Chloride 96 - 112 mEq/L 98 100 100  CO2 19 - 32 mEq/L 28 26 20(L)  Calcium 8.4 - 10.5 mg/dL 9.2 8.8(L) 9.8  Total Protein 6.1 - 8.1 g/dL - - -  Total Bilirubin 0.2 - 1.2 mg/dL - - -  Alkaline Phos 39 - 117 U/L - - -  AST 10 - 35 U/L - - -  ALT 9 - 46 U/L - - -   CBC Latest Ref Rng & Units 05/02/2019 04/25/2019 04/24/2019  WBC 4.0 - 10.5 K/uL 9.2 9.3 9.9  Hemoglobin 13.0 - 17.0 g/dL 14.6 13.7 15.2  Hematocrit 39.0 - 52.0 % 43.7 41.5 44.7  Platelets 150.0 - 400.0 K/uL 259.0 256 251   Lipid Panel     Component Value Date/Time   CHOL 132 04/11/2017 0936   TRIG 111.0 04/11/2017 0936   HDL 30.40 (L) 04/11/2017 0936   CHOLHDL 4 04/11/2017 0936   VLDL 22.2 04/11/2017 0936   LDLCALC 79 04/11/2017 0936   HEMOGLOBIN A1C No results found for: HGBA1C, MPG TSH Recent Labs    01/14/19 0958  TSH 4.39    External labs:   Medications and allergies   Allergies  Allergen Reactions  . Flecainide     Vomit   . Flexeril [Cyclobenzaprine]     Constipation  . Morphine And Related Hives    Hallucinations- can tolerate oxycodone.   . Sinemet [Carbidopa-Levodopa]     intolerant     Current Outpatient Medications  Medication Instructions  . diltiazem (CARDIZEM CD) 180 mg, Oral, Daily  . hydrochlorothiazide (MICROZIDE) 12.5 mg, Oral, Daily  . losartan (COZAAR) 50 mg, Oral, Daily  . Menthol, Topical Analgesic, (BIOFREEZE EX) 1 application, Topical, Daily PRN  . Multiple Vitamin (MULTIVITAMIN WITH MINERALS) TABS tablet 1  tablet, Oral, Daily  . ondansetron (ZOFRAN) 4 mg, Oral, Every 6 hours PRN  . promethazine (PHENERGAN) 12.5 mg, Oral, Every 8 hours PRN  . rivaroxaban (XARELTO) 20 mg, Oral, Daily with supper  .  Vitamin D 5,000 Units, Oral, Daily  . zinc gluconate 50 mg, Oral, Daily  . zolpidem (AMBIEN) 5 mg, Oral, At bedtime PRN   Radiology:  Chest x-ray portable 04/24/2019: Mediastinum and hilar structures normal. Heart size normal. Lungs are clear. No pleural effusion or pneumothorax. Prior cervical spine fusion. Degenerative change thoracic spine.  IMPRESSION: No acute cardiopulmonary disease.  Cardiac Studies:   Echocardiogram 04/25/2019:  Normal LV systolic function, EF 60 to 65%.  No other significant valvular abnormalities.  Essentially normal echocardiogram.  Left atrial size is normal.  Assessment     ICD-10-CM   1. Paroxysmal atrial fibrillation ([place]). CHA2DS2-VASc Score is 3.  Yearly risk of stroke: 3.2%(Age, HTN)  I48.0 EKG 12-Lead    diltiazem (CARDIZEM CD) 180 MG 24 hr capsule    DISCONTINUED: diltiazem (CARDIZEM CD) 240 MG 24 hr capsule  2. Essential hypertension  I10     EKG 05/09/2019: Sinus bradycardia at rate of 56 bpm, biatrial enlargement, normal axis.  Right bundle branch block.  EKG 04/24/2019: Atrial flutter with 2: 1 conduction at the rate of 147 bpm.  Rightward axis.  Right bundle branch block.  No evidence of ischemia.  Compared to the study done on 03/27/2019, sinus rhythm is now replaced by atrial flutter.   Telemetry: Paroxysmal episodes of atrial fibrillation with RVR.  Patient flips back into a flutter    Meds ordered this encounter  Medications  . DISCONTD: diltiazem (CARDIZEM CD) 240 MG 24 hr capsule    Sig: Take 1 capsule (240 mg total) by mouth daily.    Dispense:  30 capsule    Refill:  2    Discontinue propranolol  . diltiazem (CARDIZEM CD) 180 MG 24 hr capsule    Sig: Take 1 capsule (180 mg total) by mouth daily.    Dispense:  30 capsule    Refill:   2    Discontinue propranolol. Also do not dispense 240 mg daily    Medications Discontinued During This Encounter  Medication Reason  . flecainide (TAMBOCOR) 50 MG tablet   . senna-docusate (SENOKOT-S) 8.6-50 MG tablet Error  . methocarbamol (ROBAXIN) 500 MG tablet Error  . bisacodyl (DULCOLAX) 5 MG EC tablet Error  . amLODipine (NORVASC) 10 MG tablet Error  . Echinacea 450 MG CAPS Error  . propranolol ER (INDERAL LA) 120 MG 24 hr capsule Patient Preference  . diltiazem (CARDIZEM CD) 240 MG 24 hr capsule      Recommendations:   DANIEL RITTHALER  is a 74 y.o. male  with Hypertension, degenerative joint disease, admitted to the hospital on 05/01/2019 with A. fib with RVR.  Patient had presented with presyncope.  He was started on IV Cardizem, echocardiogram revealed normal LVEF.  No wall motion abnormalities.  He does have right bundle branch block. He was discharged on flecainide and propanolol as well as Xarelto for anticoagulation.  He could not tolerate flecainide and also propranolol due to marked fatigue, sleep disturbance and also severe nausea.  I have chosen propranolol in view of essential tremors which did not change much.  We will discontinue this and switch him to diltiazem CD 180 mg daily.  Continue Xarelto.  Risk of stroke discussed with the patient.  He does have hypertension, continue losartan HCT.  Adrian Prows, MD, [facility] 05/10/2019, 6:15 PM [facility]. [place] Office: [phone]

## 2019-05-12 ENCOUNTER — Encounter: Payer: Self-pay | Admitting: Neurology

## 2019-05-12 ENCOUNTER — Other Ambulatory Visit: Payer: Self-pay

## 2019-05-12 ENCOUNTER — Ambulatory Visit (INDEPENDENT_AMBULATORY_CARE_PROVIDER_SITE_OTHER): Payer: PPO | Admitting: Neurology

## 2019-05-12 VITALS — BP 154/88 | HR 105 | Resp 18 | Ht 75.0 in | Wt 208.8 lb

## 2019-05-12 DIAGNOSIS — I4891 Unspecified atrial fibrillation: Secondary | ICD-10-CM | POA: Diagnosis not present

## 2019-05-12 DIAGNOSIS — G20 Parkinson's disease: Secondary | ICD-10-CM

## 2019-05-12 DIAGNOSIS — R110 Nausea: Secondary | ICD-10-CM

## 2019-05-12 MED ORDER — PRAMIPEXOLE DIHYDROCHLORIDE 0.125 MG PO TABS: ORAL_TABLET | ORAL | 0 refills | Status: DC

## 2019-05-12 MED ORDER — PRAMIPEXOLE DIHYDROCHLORIDE 0.5 MG PO TABS: 0.5000 mg | ORAL_TABLET | Freq: Three times a day (TID) | ORAL | 1 refills | Status: DC

## 2019-05-12 NOTE — Patient Instructions (Addendum)
1.  Start mirapex (pramipexole) as follows:  0.125 mg - 1 tablet three times per day for a week, then 2 tablets three times per day for a week and then fill the 0.5 mg tablet and take that, 1 pill three times per day   2.  Don't take phenergan (the other nausea medication you have)  The physicians and staff at [facility] are committed to providing excellent care. You may receive a survey requesting feedback about your experience at our office. We strive to receive "very good" responses to the survey questions. If you feel that your experience would prevent you from giving the office a "very good " response, please contact our office to try to remedy the situation. We may be reached at [phone]. Thank you for taking the time out of your busy day to complete the survey.

## 2019-05-13 ENCOUNTER — Other Ambulatory Visit: Payer: Self-pay

## 2019-05-13 ENCOUNTER — Telehealth: Payer: Self-pay

## 2019-05-13 DIAGNOSIS — I480 Paroxysmal atrial fibrillation: Secondary | ICD-10-CM

## 2019-05-13 MED ORDER — DILTIAZEM HCL ER COATED BEADS 240 MG PO CP24: 240.0000 mg | ORAL_CAPSULE | Freq: Every day | ORAL | 3 refills | Status: DC

## 2019-05-13 NOTE — Telephone Encounter (Signed)
Called pt to inform him to only take the diltiazem and to increase it to 240. Pt understood.

## 2019-05-13 NOTE — Telephone Encounter (Signed)
Pt called to inform us that he has been having high bp of 168/101 with a pules between 97 and 101. Pt has taken his Amlodipine.  Pt would like to know what should he do. Please advice thank you

## 2019-05-13 NOTE — Telephone Encounter (Signed)
Pt called and says that he cannot break his diltiazem in half to make 240 mg because that's not 240 its going to be 270 mg; Also they are capsules not tablets. So I called in a new prescription doe 240 mg QD Per the last msg for [place] and Lolita Patella

## 2019-05-13 NOTE — Telephone Encounter (Signed)
Pt mention yes he is taking Diltiazem once a day

## 2019-05-13 NOTE — Telephone Encounter (Signed)
He should not be on both diltiazem and amlodipine. They are similar medications. Can try increasing his diltiazem to  240 mg

## 2019-05-14 ENCOUNTER — Telehealth: Payer: Self-pay | Admitting: Neurology

## 2019-05-14 ENCOUNTER — Telehealth: Payer: Self-pay

## 2019-05-14 ENCOUNTER — Other Ambulatory Visit: Payer: Self-pay

## 2019-05-14 ENCOUNTER — Other Ambulatory Visit: Payer: Self-pay | Admitting: Family Medicine

## 2019-05-14 DIAGNOSIS — I480 Paroxysmal atrial fibrillation: Secondary | ICD-10-CM

## 2019-05-14 DIAGNOSIS — I10 Essential (primary) hypertension: Secondary | ICD-10-CM

## 2019-05-14 MED ORDER — PROPRANOLOL HCL ER 120 MG PO CP24: 120.0000 mg | ORAL_CAPSULE | Freq: Every day | ORAL | 0 refills | Status: DC

## 2019-05-14 MED ORDER — DILTIAZEM HCL ER COATED BEADS 180 MG PO CP24: 180.0000 mg | ORAL_CAPSULE | Freq: Every day | ORAL | 0 refills | Status: DC

## 2019-05-14 NOTE — Telephone Encounter (Signed)
Patient called with questions about his medication. He said Dr. Carles Collet prescribed him a new medication for his tremors and he is not doing well on it. It is pramipexole .125 MG.  He's taken two doses of ondansetrin 4 MG today and it has not helped. He said his stomach is very upset and he cannot keep anything down.

## 2019-05-14 NOTE — Telephone Encounter (Signed)
Pt aware of change. Rx's sent to pharmacy

## 2019-05-14 NOTE — Telephone Encounter (Signed)
Pt called stating that diltiazem is causing him to have nausea and decreased appetite. He tried ondansetron with no relief of nausea. There are 2 other messages yesterday regarding this med.//ah

## 2019-05-14 NOTE — Telephone Encounter (Signed)
He was placed on medications and he felt similarly. Now off medications he feels similarly. I am happy to see and address this. Maybe stopping the medications he may have gone back into A. Fib

## 2019-05-14 NOTE — Telephone Encounter (Signed)
Patient called ans stated that his BP was 177/93 and has not been able to bring it down and now he feels like he is trembling and nervous. Patient stated that he takes 3 different BP medications and is taking them all as directed. He also stated that he is severly nauseous. He wants to know what he should do, because he does not feel well at all. Please advise.

## 2019-05-14 NOTE — Telephone Encounter (Signed)
Interesting.  I wrote in my note prior to starting that medication that the patient has chronic nausea and was using zofran for it (see note).  Traditionally, pramipexole doesn't cause nausea at all.  Is he SURE that the med is causing that?  If so, then why was he on nausea med before I RX the pramipexole?

## 2019-05-14 NOTE — Telephone Encounter (Signed)
Can he go back on the lower dose of the diltiazem since he was tolerating that and also go back on Propanolol that he was discharged on?

## 2019-05-15 ENCOUNTER — Ambulatory Visit (INDEPENDENT_AMBULATORY_CARE_PROVIDER_SITE_OTHER)
Admission: RE | Admit: 2019-05-15 | Discharge: 2019-05-15 | Disposition: A | Discharge status: Home / Self Care | Payer: PPO | Source: Ambulatory Visit | Attending: Family Medicine | Admitting: Family Medicine

## 2019-05-15 ENCOUNTER — Emergency Department (HOSPITAL_COMMUNITY): Payer: PPO

## 2019-05-15 ENCOUNTER — Telehealth: Payer: Self-pay

## 2019-05-15 ENCOUNTER — Telehealth: Payer: Self-pay | Admitting: Family Medicine

## 2019-05-15 ENCOUNTER — Inpatient Hospital Stay (HOSPITAL_COMMUNITY)
Admission: EM | Admit: 2019-05-15 | Discharge: 2019-05-17 | DRG: 641 | Disposition: A | Discharge status: Home / Self Care | Payer: PPO | Attending: Internal Medicine | Admitting: Internal Medicine

## 2019-05-15 ENCOUNTER — Encounter: Payer: Self-pay | Admitting: Family Medicine

## 2019-05-15 ENCOUNTER — Encounter (HOSPITAL_COMMUNITY): Payer: Self-pay

## 2019-05-15 ENCOUNTER — Other Ambulatory Visit: Payer: Self-pay

## 2019-05-15 ENCOUNTER — Ambulatory Visit (INDEPENDENT_AMBULATORY_CARE_PROVIDER_SITE_OTHER): Payer: PPO | Admitting: Family Medicine

## 2019-05-15 VITALS — BP 144/80 | HR 101 | Temp 96.7°F | Ht 75.0 in | Wt 214.2 lb

## 2019-05-15 DIAGNOSIS — Z885 Allergy status to narcotic agent status: Secondary | ICD-10-CM

## 2019-05-15 DIAGNOSIS — K5900 Constipation, unspecified: Secondary | ICD-10-CM | POA: Diagnosis present

## 2019-05-15 DIAGNOSIS — I482 Chronic atrial fibrillation, unspecified: Secondary | ICD-10-CM | POA: Diagnosis present

## 2019-05-15 DIAGNOSIS — K219 Gastro-esophageal reflux disease without esophagitis: Secondary | ICD-10-CM | POA: Diagnosis not present

## 2019-05-15 DIAGNOSIS — Z833 Family history of diabetes mellitus: Secondary | ICD-10-CM | POA: Diagnosis not present

## 2019-05-15 DIAGNOSIS — G20 Parkinson's disease: Secondary | ICD-10-CM | POA: Diagnosis not present

## 2019-05-15 DIAGNOSIS — Z20822 Contact with and (suspected) exposure to covid-19: Secondary | ICD-10-CM | POA: Diagnosis present

## 2019-05-15 DIAGNOSIS — G4733 Obstructive sleep apnea (adult) (pediatric): Secondary | ICD-10-CM | POA: Diagnosis present

## 2019-05-15 DIAGNOSIS — T502X5A Adverse effect of carbonic-anhydrase inhibitors, benzothiadiazides and other diuretics, initial encounter: Secondary | ICD-10-CM | POA: Diagnosis not present

## 2019-05-15 DIAGNOSIS — Z825 Family history of asthma and other chronic lower respiratory diseases: Secondary | ICD-10-CM

## 2019-05-15 DIAGNOSIS — M4807 Spinal stenosis, lumbosacral region: Secondary | ICD-10-CM | POA: Diagnosis present

## 2019-05-15 DIAGNOSIS — R000 Tachycardia, unspecified: Secondary | ICD-10-CM | POA: Diagnosis not present

## 2019-05-15 DIAGNOSIS — I10 Essential (primary) hypertension: Secondary | ICD-10-CM | POA: Diagnosis not present

## 2019-05-15 DIAGNOSIS — R072 Precordial pain: Secondary | ICD-10-CM | POA: Diagnosis not present

## 2019-05-15 DIAGNOSIS — F419 Anxiety disorder, unspecified: Secondary | ICD-10-CM | POA: Diagnosis not present

## 2019-05-15 DIAGNOSIS — Z888 Allergy status to other drugs, medicaments and biological substances status: Secondary | ICD-10-CM

## 2019-05-15 DIAGNOSIS — R110 Nausea: Secondary | ICD-10-CM | POA: Diagnosis not present

## 2019-05-15 DIAGNOSIS — Z7901 Long term (current) use of anticoagulants: Secondary | ICD-10-CM | POA: Diagnosis not present

## 2019-05-15 DIAGNOSIS — Z981 Arthrodesis status: Secondary | ICD-10-CM

## 2019-05-15 DIAGNOSIS — E861 Hypovolemia: Secondary | ICD-10-CM | POA: Diagnosis not present

## 2019-05-15 DIAGNOSIS — F0280 Dementia in other diseases classified elsewhere without behavioral disturbance: Secondary | ICD-10-CM | POA: Diagnosis present

## 2019-05-15 DIAGNOSIS — Y92009 Unspecified place in unspecified non-institutional (private) residence as the place of occurrence of the external cause: Secondary | ICD-10-CM | POA: Diagnosis not present

## 2019-05-15 DIAGNOSIS — E871 Hypo-osmolality and hyponatremia: Secondary | ICD-10-CM | POA: Diagnosis not present

## 2019-05-15 DIAGNOSIS — Z79899 Other long term (current) drug therapy: Secondary | ICD-10-CM | POA: Diagnosis not present

## 2019-05-15 DIAGNOSIS — M545 Low back pain, unspecified: Secondary | ICD-10-CM | POA: Diagnosis present

## 2019-05-15 DIAGNOSIS — E639 Nutritional deficiency, unspecified: Secondary | ICD-10-CM | POA: Diagnosis not present

## 2019-05-15 DIAGNOSIS — Z8249 Family history of ischemic heart disease and other diseases of the circulatory system: Secondary | ICD-10-CM | POA: Diagnosis not present

## 2019-05-15 DIAGNOSIS — Z823 Family history of stroke: Secondary | ICD-10-CM

## 2019-05-15 DIAGNOSIS — R0902 Hypoxemia: Secondary | ICD-10-CM | POA: Diagnosis not present

## 2019-05-15 DIAGNOSIS — R112 Nausea with vomiting, unspecified: Secondary | ICD-10-CM | POA: Diagnosis not present

## 2019-05-15 LAB — CBC WITH DIFFERENTIAL/PLATELET
Basophils Absolute: 0 10*3/uL (ref 0.0–0.1)
Basophils Relative: 0.2 % (ref 0.0–3.0)
Eosinophils Absolute: 0 10*3/uL (ref 0.0–0.7)
Eosinophils Relative: 0.2 % (ref 0.0–5.0)
HCT: 36.8 % — ABNORMAL LOW (ref 39.0–52.0)
Hemoglobin: 13 g/dL (ref 13.0–17.0)
Lymphocytes Relative: 12.9 % (ref 12.0–46.0)
Lymphs Abs: 1.5 10*3/uL (ref 0.7–4.0)
MCHC: 35.4 g/dL (ref 30.0–36.0)
MCV: 86.1 fl (ref 78.0–100.0)
Monocytes Absolute: 1.3 10*3/uL — ABNORMAL HIGH (ref 0.1–1.0)
Monocytes Relative: 11 % (ref 3.0–12.0)
Neutro Abs: 8.9 10*3/uL — ABNORMAL HIGH (ref 1.4–7.7)
Neutrophils Relative %: 75.7 % (ref 43.0–77.0)
Platelets: 271 10*3/uL (ref 150.0–400.0)
RBC: 4.27 Mil/uL (ref 4.22–5.81)
RDW: 12.9 % (ref 11.5–15.5)
WBC: 11.7 10*3/uL — ABNORMAL HIGH (ref 4.0–10.5)

## 2019-05-15 LAB — COMPREHENSIVE METABOLIC PANEL
ALT: 15 U/L (ref 0–53)
AST: 19 U/L (ref 0–37)
Albumin: 4.2 g/dL (ref 3.5–5.2)
Alkaline Phosphatase: 77 U/L (ref 39–117)
BUN: 7 mg/dL (ref 6–23)
CO2: 24 mEq/L (ref 19–32)
Calcium: 8.8 mg/dL (ref 8.4–10.5)
Chloride: 81 mEq/L — ABNORMAL LOW (ref 96–112)
Creatinine, Ser: 0.9 mg/dL (ref 0.40–1.50)
GFR: 82.73 mL/min (ref >60.00)
Glucose, Bld: 110 mg/dL — ABNORMAL HIGH (ref 70–99)
Potassium: 4.1 mEq/L (ref 3.5–5.1)
Sodium: 113 mEq/L — CL (ref 135–145)
Total Bilirubin: 0.9 mg/dL (ref 0.2–1.2)
Total Protein: 6.4 g/dL (ref 6.0–8.3)

## 2019-05-15 LAB — URINALYSIS, ROUTINE W REFLEX MICROSCOPIC
Bilirubin Urine: NEGATIVE
Glucose, UA: NEGATIVE mg/dL
Hgb urine dipstick: NEGATIVE
Ketones, ur: 20 mg/dL — AB
Leukocytes,Ua: NEGATIVE
Nitrite: NEGATIVE
Protein, ur: 30 mg/dL — AB
Specific Gravity, Urine: 1.014 (ref 1.005–1.030)
pH: 8 (ref 5.0–8.0)

## 2019-05-15 LAB — BASIC METABOLIC PANEL
Anion gap: 11 (ref 5–15)
BUN: 8 mg/dL (ref 8–23)
CO2: 20 mmol/L — ABNORMAL LOW (ref 22–32)
Calcium: 7.7 mg/dL — ABNORMAL LOW (ref 8.9–10.3)
Chloride: 81 mmol/L — ABNORMAL LOW (ref 98–111)
Creatinine, Ser: 0.91 mg/dL (ref 0.61–1.24)
GFR calc Af Amer: >60 mL/min (ref >60)
GFR calc non Af Amer: >60 mL/min (ref >60)
Glucose, Bld: 93 mg/dL (ref 70–99)
Potassium: 4.2 mmol/L (ref 3.5–5.1)
Sodium: 112 mmol/L — CL (ref 135–145)

## 2019-05-15 LAB — PHOSPHORUS: Phosphorus: 2.5 mg/dL (ref 2.5–4.6)

## 2019-05-15 LAB — OSMOLALITY, URINE: Osmolality, Ur: 518 mOsm/kg (ref 300–900)

## 2019-05-15 LAB — MAGNESIUM: Magnesium: 1.4 mg/dL — ABNORMAL LOW (ref 1.7–2.4)

## 2019-05-15 LAB — LIPASE: Lipase: 20 U/L (ref 11.0–59.0)

## 2019-05-15 MED ORDER — DILTIAZEM HCL ER COATED BEADS 180 MG PO CP24
180.0000 mg | ORAL_CAPSULE | Freq: Every day | ORAL | Status: DC
  Administered 2019-05-16 – 2019-05-17 (×2): 180 mg via ORAL
  Filled 2019-05-15 (×2): qty 1

## 2019-05-15 MED ORDER — DIAZEPAM 5 MG PO TABS: 2.5000 mg | ORAL_TABLET | Freq: Two times a day (BID) | ORAL | 1 refills | Status: DC | PRN

## 2019-05-15 MED ORDER — PANTOPRAZOLE SODIUM 40 MG IV SOLR
40.0000 mg | Freq: Once | INTRAVENOUS | Status: AC
  Administered 2019-05-15: 19:00:00 40 mg via INTRAVENOUS
  Filled 2019-05-15: qty 40

## 2019-05-15 MED ORDER — SODIUM CHLORIDE 3 % IV SOLN
INTRAVENOUS | Status: DC
  Filled 2019-05-15: qty 500

## 2019-05-15 MED ORDER — SODIUM CHLORIDE 3 % IV SOLN
INTRAVENOUS | Status: DC
  Administered 2019-05-16: 50 mL/h via INTRAVENOUS
  Filled 2019-05-15: qty 500

## 2019-05-15 MED ORDER — LOSARTAN POTASSIUM 50 MG PO TABS: 50.0000 mg | ORAL_TABLET | Freq: Every day | ORAL | Status: DC

## 2019-05-15 MED ORDER — LORAZEPAM 2 MG/ML IJ SOLN: 1.0000 mg | Freq: Once | INTRAMUSCULAR | Status: DC

## 2019-05-15 MED ORDER — LORAZEPAM 1 MG PO TABS
1.0000 mg | ORAL_TABLET | Freq: Once | ORAL | Status: AC
  Administered 2019-05-15: 23:00:00 1 mg via ORAL
  Filled 2019-05-15: qty 1

## 2019-05-15 MED ORDER — DIAZEPAM 5 MG PO TABS: 2.5000 mg | ORAL_TABLET | Freq: Two times a day (BID) | ORAL | Status: DC | PRN

## 2019-05-15 MED ORDER — SODIUM CHLORIDE 0.9% FLUSH: 3.0000 mL | INTRAVENOUS | Status: DC | PRN

## 2019-05-15 MED ORDER — ONDANSETRON HCL 4 MG/2ML IJ SOLN
4.0000 mg | Freq: Once | INTRAMUSCULAR | Status: AC
  Administered 2019-05-15: 4 mg via INTRAVENOUS
  Filled 2019-05-15: qty 2

## 2019-05-15 MED ORDER — ACETAMINOPHEN 650 MG RE SUPP: 650.0000 mg | Freq: Four times a day (QID) | RECTAL | Status: DC | PRN

## 2019-05-15 MED ORDER — ALUM & MAG HYDROXIDE-SIMETH 200-200-20 MG/5ML PO SUSP
30.0000 mL | Freq: Once | ORAL | Status: AC
  Administered 2019-05-15: 21:00:00 30 mL via ORAL
  Filled 2019-05-15: qty 30

## 2019-05-15 MED ORDER — SODIUM CHLORIDE 3 % IV BOLUS: 250.0000 mL | Freq: Once | INTRAVENOUS | Status: DC

## 2019-05-15 MED ORDER — ZOLPIDEM TARTRATE 5 MG PO TABS
5.0000 mg | ORAL_TABLET | Freq: Every evening | ORAL | Status: DC | PRN
  Administered 2019-05-16: 5 mg via ORAL
  Filled 2019-05-15: qty 1

## 2019-05-15 MED ORDER — PYRIDOXINE HCL 25 MG PO TABS: 25.0000 mg | ORAL_TABLET | Freq: Three times a day (TID) | ORAL | Status: DC | PRN

## 2019-05-15 MED ORDER — PRAMIPEXOLE DIHYDROCHLORIDE 0.125 MG PO TABS
0.1250 mg | ORAL_TABLET | Freq: Three times a day (TID) | ORAL | Status: DC
  Administered 2019-05-16 – 2019-05-17 (×5): 0.125 mg via ORAL
  Filled 2019-05-15 (×7): qty 1

## 2019-05-15 MED ORDER — ONDANSETRON HCL 4 MG PO TABS
4.0000 mg | ORAL_TABLET | Freq: Four times a day (QID) | ORAL | Status: DC | PRN
  Administered 2019-05-15 – 2019-05-16 (×2): 4 mg via ORAL
  Filled 2019-05-15 (×2): qty 1

## 2019-05-15 MED ORDER — IOHEXOL 300 MG/ML  SOLN
100.0000 mL | Freq: Once | INTRAMUSCULAR | Status: AC | PRN
  Administered 2019-05-15: 21:00:00 100 mL via INTRAVENOUS

## 2019-05-15 MED ORDER — SODIUM CHLORIDE 0.9 % IV BOLUS
500.0000 mL | Freq: Once | INTRAVENOUS | Status: AC
  Administered 2019-05-15: 500 mL via INTRAVENOUS

## 2019-05-15 MED ORDER — BISACODYL 5 MG PO TBEC: 5.0000 mg | DELAYED_RELEASE_TABLET | Freq: Every day | ORAL | Status: DC | PRN

## 2019-05-15 MED ORDER — ALUM & MAG HYDROXIDE-SIMETH 200-200-20 MG/5ML PO SUSP
30.0000 mL | Freq: Once | ORAL | Status: AC
  Administered 2019-05-15: 19:00:00 30 mL via ORAL
  Filled 2019-05-15: qty 30

## 2019-05-15 MED ORDER — ACETAMINOPHEN 325 MG PO TABS
650.0000 mg | ORAL_TABLET | Freq: Four times a day (QID) | ORAL | Status: DC | PRN
  Administered 2019-05-16 – 2019-05-17 (×3): 650 mg via ORAL
  Filled 2019-05-15 (×3): qty 2

## 2019-05-15 MED ORDER — VITAMIN B-6 25 MG PO TABS
25.0000 mg | ORAL_TABLET | Freq: Three times a day (TID) | ORAL | Status: DC | PRN
  Filled 2019-05-15: qty 1

## 2019-05-15 MED ORDER — RIVAROXABAN 20 MG PO TABS
20.0000 mg | ORAL_TABLET | Freq: Every day | ORAL | Status: DC
  Administered 2019-05-16: 20 mg via ORAL
  Filled 2019-05-15 (×2): qty 1

## 2019-05-15 MED ORDER — SODIUM CHLORIDE 0.9 % IV SOLN: 250.0000 mL | INTRAVENOUS | Status: DC | PRN

## 2019-05-15 MED ORDER — SODIUM CHLORIDE 0.9 % IV SOLN: INTRAVENOUS | Status: DC

## 2019-05-15 MED ORDER — SODIUM CHLORIDE 0.9% FLUSH: 3.0000 mL | Freq: Two times a day (BID) | INTRAVENOUS | Status: DC

## 2019-05-15 NOTE — Telephone Encounter (Signed)
Pt is aware and is going to speak with his PCP today

## 2019-05-15 NOTE — Telephone Encounter (Signed)
P has called back stating #1 They are having a hard time finding the B6. I Suggested [facility] #2. He is still nauseous. Asking what to do. He said he never took the Zofran that is on his med list because her saud Dr Damita Dunnings told him not to take anything due to his Parkinson's

## 2019-05-15 NOTE — Telephone Encounter (Signed)
May be he should try propranolol but at low dose. Propranolol 20 mg TID

## 2019-05-15 NOTE — Progress Notes (Signed)
This visit occurred during the SARS-CoV-2 public health emergency.  Safety protocols were in place, including screening questions prior to the visit, additional usage of staff PPE, and extensive cleaning of exam room while observing appropriate contact time as indicated for disinfecting solutions.  He isn't sleeping well.  Nausea, dry heaves.  zofran isn't helping.  No fevers.  No blood in vomit.  Not passing blood.  Getting off propranolol and flecainide helped some but then return of sx.  pepto didn't help.  No abd pain other than from heaving.  Still having BMs.  Taking miralax at baseline.    Neuro advised to hold pramipexole but that may not be the issue.  Tremor is worse in the meantime.  He is still making urine well.    Meds, vitals, and allergies reviewed.   ROS: Per HPI unless specifically indicated in ROS section   GEN: nad, alert and oriented HEENT: ncat NECK: supple w/o LA CV: rrr. PULM: ctab, no inc wob ABD: soft, +bs, not tender to palpation.  No rebound. EXT: no edema SKIN: no acute rash, well perfused. [place] at baseline.

## 2019-05-15 NOTE — ED Provider Notes (Signed)
  Face-to-face evaluation   History: He presents for evaluation of hyponatremia, found on screening labs by his PCP.  He had presented for evaluation of nausea.  He has had ongoing nausea for at least a week and belching for 3 days.  He has not been able to eat or drink much.  Physical exam: Elderly alert, uncomfortable.  Belching continuously.  Abdomen is soft but distended.  No respiratory distress.  No dysarthria or aphasia.  Tremor consistent with Parkinson's disease is present.  Medical screening examination/treatment/procedure(s) were conducted as a shared visit with non-physician practitioner(s) and myself.  I personally evaluated the patient during the encounter    Daleen Bo, MD 05/16/19 1343

## 2019-05-15 NOTE — Telephone Encounter (Signed)
Called patient and advised to go to the ER.  Pt said wife would take [facility] to [facility] [place], he expressed concerns that they would not see him with all the COVID stuff going on.

## 2019-05-15 NOTE — Telephone Encounter (Signed)
Spoke with Pt he has a HX of nausea and stomach issues that is why he has the zofran, he isnt sure if its the pramipexole causing him to feel bad or not. He has an appointment with his PCP this morning, He stated he is shaking cant stop (no fever), cant keep food down ( has had dry heaves), cant sleep ( took [place] and was still up all night). He stated he just wants to feel better and get some sleep.

## 2019-05-15 NOTE — Telephone Encounter (Signed)
Patient was given a rx for Diazepam for nausea.  Patient started medication around 11:30 or 12:00 today and he said it hasn't helped at all.  Patient wants to know if he can double up on the medication.

## 2019-05-15 NOTE — ED Notes (Signed)
  Pt transported to ct 

## 2019-05-15 NOTE — Telephone Encounter (Signed)
Patient advised.

## 2019-05-15 NOTE — Telephone Encounter (Signed)
Spoke with patient, he asked how soon can he see you, be cause he is not feeling well and can't stop the trembling. Please advise.

## 2019-05-15 NOTE — Telephone Encounter (Signed)
Just called ans spoke to pt He can hold the pramipexole for a week and then restart the titration as previously directed.  I feel sure its not the pramipexole.  He needs to get to PCP for the nausea and maybe GI if chronic nausea. Pt verbalized understanding

## 2019-05-15 NOTE — Patient Instructions (Signed)
Go to the lab on the way out.   Try taking B6 3 times a day.  If needed, use valium for nausea or sleep but don't take with ambien.  We'll go from there.  Update me as needed.  Take care.  Glad to see you.

## 2019-05-15 NOTE — ED Provider Notes (Signed)
[facility] EMERGENCY DEPARTMENT Provider Note   CSN: [id number] Arrival date & time: 05/15/19  1746     History Chief Complaint  Patient presents with  . PCP said hyponatremia    [name] is a 74 y.o. male with PMH significant for anxiety, atrial fibrillation on Xarelto, GERD, HTN, and OSA who presents to the ED from home via EMS.  Patient went to his primary care provider this morning and they called him with instructions to go to the ED for abnormal labs, specifically a dangerously low sodium.  Patient has been reporting a 3-day history of diminished p.o. intake, intermittent nausea symptoms, belching, small stools, and intermittent abdominal discomfort.  He reports that plain films abdomen obtained this morning did not demonstrate any evidence of an acute obstruction.  I spoke with EMS who reports that he had one episode of emesis while on route to the hospital.  They provided 500 mL normal saline and 4 mg Zofran IV.  Patient's 3-day history of symptoms is what prompted him to go to the primary care provider for evaluation.  He also reports that his tremors have been more pronounced than usual and has made sleeping at night difficult.  On exam, he is also complaining of burning substernal chest discomfort radiating towards his throat as well as his unrelenting eructation.  HPI     Past Medical History:  Diagnosis Date  . Arthritis   . GERD (gastroesophageal reflux disease)   . H/O urinary frequency   . Heart murmur    as a child  . History of bronchitis   . HSV infection    oral  . Hypertension    improved after back pain treated with surgery  . Insomnia   . Joint pain   . OSA on CPAP    he ordered kit and started use 2017 w/o formal testing, see note from 11/30/15.   . Vertigo 10/13/2014   recurrent; trigger is cool air on left ear, improved with daily antihistamine    Patient Active Problem List   Diagnosis Date Noted  . Hyponatremia  05/15/2019  . Atrial fibrillation, chronic ([place]) 05/15/2019  . Parkinson disease ([place]) 05/04/2019  . 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 Atrial fibrillation with RVR ([place]) 04/24/2019  . History of recent back surgery 04/24/2019  . 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 Cough 04/24/2018  . Skin lesion 10/31/2017  . Routine health maintenance 04/23/2017  . Lower urinary tract symptoms (LUTS) 04/17/2016  . Oral lesion 02/16/2016  . Medicare annual wellness visit, initial 12/01/2015  . Advance care planning 12/01/2015  . Lower back pain 12/01/2015  . OSA on CPAP   . HTN (hypertension) 04/23/2015  . Spondylolisthesis of lumbar region 12/16/2014  . GERD (gastroesophageal reflux disease) 10/11/2012  . RBBB 06/27/2011    Past Surgical History:  Procedure Laterality Date  . ACHILLES TENDON REPAIR Left   . BACK SURGERY    . COLONOSCOPY  02/2011   Negative,[facility]  . DEBRIDEMENT AND CLOSURE WOUND Left 02/25/2018   Procedure: DEBRIDEMENT LEFT LEG APPLICATION INTEGRA;  Surgeon: Irene Limbo, MD;  Location: [facility];  Service: Plastics;  Laterality: Left;  . EYE SURGERY Bilateral    cataracts  . KNEE ARTHROSCOPY     L  . LUMBAR FUSION     2016  . LUMBAR SPINE SURGERY  03/31/2019   exploration of lumbar fusion w/L5-S1 decompression redo L4-5  fusion  . SPINE SURGERY  2003   cervical fusion  . TONSILLECTOMY    . varicoelectomy         Family History  Problem Relation Age of Onset  . Hypertension Mother   . Asthma Mother   . Diabetes Father   . Stroke Father   . Heart attack Paternal Grandfather        >55  . Cancer Sister   . Colon cancer Neg Hx   . Prostate cancer Neg Hx     Social History   Tobacco Use  . Smoking status: Never Smoker  . Smokeless tobacco: Never Used  Substance Use Topics  . Alcohol use: No  . Drug use: No    Home Medications Prior to Admission  medications   Medication Sig Start Date End Date Taking? Authorizing Provider  diazepam (VALIUM) 5 MG tablet Take 0.5-1 tablets (2.5-5 mg total) by mouth every 12 (twelve) hours as needed (for nausea or sleep. don't take with Lorrin Mais). 05/15/19   Tonia Ghent, MD  diltiazem (CARDIZEM CD) 180 MG 24 hr capsule Take 1 capsule (180 mg total) by mouth daily. 05/14/19 08/12/19  Miquel Dunn, NP  hydrochlorothiazide (MICROZIDE) 12.5 MG capsule Take 1 capsule (12.5 mg total) by mouth daily. 04/26/19   Eugenie Filler, MD  losartan (COZAAR) 50 MG tablet Take 1 tablet (50 mg total) by mouth daily. 04/26/19   Eugenie Filler, MD  Menthol, Topical Analgesic, (BIOFREEZE EX) Apply 1 application topically daily as needed (pain).    [provider]  ondansetron (ZOFRAN) 4 MG tablet Take 1 tablet (4 mg total) by mouth every 6 (six) hours as needed for nausea or vomiting. 04/01/19   Viona Gilmore D, NP  pramipexole (MIRAPEX) 0.125 MG tablet 1 tablet three times per day for a week, then 2 po three times per day for a week 05/12/19   Tat, Eustace Quail, DO  pramipexole (MIRAPEX) 0.5 MG tablet Take 1 tablet (0.5 mg total) by mouth 3 (three) times daily. 05/12/19   Tat, Eustace Quail, DO  pyridOXINE (VITAMIN B-6) 25 MG tablet Take 1 tablet (25 mg total) by mouth 3 (three) times daily as needed (nausea). 05/15/19   Tonia Ghent, MD  rivaroxaban (XARELTO) 20 MG TABS tablet Take 1 tablet (20 mg total) by mouth daily with supper. 04/25/19   Eugenie Filler, MD  zolpidem (AMBIEN) 5 MG tablet Take 1 tablet (5 mg total) by mouth at bedtime as needed for sleep. 05/02/19   Adrian Prows, MD    Allergies    Flecainide, Flexeril [cyclobenzaprine], Morphine and related, Promethazine, and Sinemet [carbidopa-levodopa]  Review of Systems   Review of Systems  All other systems reviewed and are negative.     Physical Exam Updated Vital Signs BP (!) 160/101   Pulse 98   Temp 98.2 F (36.8 C) (Oral)   Resp 11   Ht  '6\' 3"'$  (1.905 m)   Wt 97.1 kg   SpO2 96%   BMI 26.75 kg/m   Physical Exam Vitals and nursing note reviewed. Exam conducted with a chaperone present.  Constitutional:      Appearance: He is not ill-appearing.  HENT:     Head: Normocephalic and atraumatic.  Eyes:     General: No scleral icterus.    Conjunctiva/sclera: Conjunctivae normal.  Cardiovascular:     Rate and Rhythm: Normal rate and regular rhythm.     Pulses: Normal pulses.     Heart sounds:  Normal heart sounds.  Pulmonary:     Effort: Pulmonary effort is normal. No respiratory distress.     Breath sounds: Normal breath sounds.  Abdominal:     Comments: Soft, mildly distended.  No TTP.  No guarding.  No overlying skin changes.    Musculoskeletal:     Cervical back: Normal range of motion and neck supple. No rigidity.  Skin:    General: Skin is dry.     Capillary Refill: Capillary refill takes less than 2 seconds.  Neurological:     Mental Status: He is alert.     GCS: GCS eye subscore is 4. GCS verbal subscore is 5. GCS motor subscore is 6.     Comments: Upper extremity resting tremor consistent with his Parkinson's disease.  Psychiatric:        Mood and Affect: Mood normal.        Behavior: Behavior normal.        Thought Content: Thought content normal.     ED Results / Procedures / Treatments   Labs (all labs ordered are listed, but only abnormal results are displayed) Labs Reviewed  URINALYSIS, ROUTINE W REFLEX MICROSCOPIC - Abnormal; Notable for the following components:      Result Value   APPearance CLOUDY (*)    Ketones, ur 20 (*)    Protein, ur 30 (*)    Bacteria, UA RARE (*)    All other components within normal limits  MAGNESIUM - Abnormal; Notable for the following components:   Magnesium 1.4 (*)    All other components within normal limits  BASIC METABOLIC PANEL - Abnormal; Notable for the following components:   Sodium 112 (*)    Chloride 81 (*)    CO2 20 (*)    Calcium 7.7 (*)    All  other components within normal limits  OSMOLALITY, URINE  PHOSPHORUS    EKG None  Radiology DG Abd 1 View  Result Date: 05/15/2019 CLINICAL DATA:  Nausea EXAM: ABDOMEN - 1 VIEW COMPARISON:  CT abdomen pelvis 11/20/2006 FINDINGS: Nonobstructive bowel gas pattern. Moderate stool in the colon. No dilated bowel loops. No abnormal calcifications. Lumbar fusion L4-5 and L5-S1. Mild degenerative change in both hips. IMPRESSION: Moderate stool in the colon. Electronically Signed   By: Franchot Gallo M.D.   On: 05/15/2019 10:55   CT Abdomen Pelvis W Contrast  Result Date: 05/15/2019 CLINICAL DATA:  Hyponatremia and constipation. EXAM: CT ABDOMEN AND PELVIS WITH CONTRAST TECHNIQUE: Multidetector CT imaging of the abdomen and pelvis was performed using the standard protocol following bolus administration of intravenous contrast. CONTRAST:  157m OMNIPAQUE IOHEXOL 300 MG/ML  SOLN COMPARISON:  November 20, 2006 FINDINGS: Lower chest: No acute abnormality. Hepatobiliary: Mild diffuse fatty infiltration of the liver parenchyma is seen. A punctate calcified granuloma is noted within the anterior aspect of the right lobe of the liver. No gallstones, gallbladder wall thickening, or biliary dilatation. Pancreas: Unremarkable. No pancreatic ductal dilatation or surrounding inflammatory changes. Spleen: Normal in size without focal abnormality. Adrenals/Urinary Tract: Adrenal glands are unremarkable. Kidneys are normal, without renal calculi or hydronephrosis. Bilateral 6 mm renal cysts are seen. Bladder is unremarkable. Stomach/Bowel: Stomach is within normal limits. The appendix is not identified. No evidence of bowel dilatation. A large amount of stool is seen throughout the ascending, transverse and descending colon. Vascular/Lymphatic: Marked severity aortic atherosclerosis. No enlarged abdominal or pelvic lymph nodes. Reproductive: The prostate gland is moderately enlarged. Other: No abdominal wall hernia or  abnormality. No  abdominopelvic ascites. Musculoskeletal: Bilateral pedicle screws are seen within the mid and lower lumbar spine. There is associated streak artifact and subsequently limited evaluation of the adjacent osseous and soft tissue structures. This represents a new finding when compared to the prior study. IMPRESSION: 1. Large amount of stool throughout the ascending, transverse and descending colon. 2. Marked severity aortic atherosclerosis. 3. Postoperative changes within the mid and lower lumbar spine. Aortic Atherosclerosis (ICD10-I70.0). Electronically Signed   By: Virgina Norfolk M.D.   On: 05/15/2019 20:49   DG Chest Portable 1 View  Result Date: 05/15/2019 CLINICAL DATA:  Belching.  Nausea, vomiting. EXAM: PORTABLE CHEST 1 VIEW COMPARISON:  04/24/2019 FINDINGS: Heart and mediastinal contours are within normal limits. No focal opacities or effusions. No acute bony abnormality. IMPRESSION: No active cardiopulmonary disease. Electronically Signed   By: Rolm Baptise M.D.   On: 05/15/2019 22:46    Procedures .Critical Care Performed by: Corena Herter, PA-C Authorized by: Corena Herter, PA-C   Critical care provider statement:    Critical care time (minutes):  30   Critical care was necessary to treat or prevent imminent or life-threatening deterioration of the following conditions:  Metabolic crisis   Critical care was time spent personally by me on the following activities:  Discussions with consultants, evaluation of patient's response to treatment, examination of patient, ordering and review of laboratory studies, ordering and review of radiographic studies, pulse oximetry, re-evaluation of patient's condition, review of old charts and obtaining history from patient or surrogate   (including critical care time)  Medications Ordered in ED Medications  LORazepam (ATIVAN) tablet 1 mg (has no administration in time range)  sodium chloride 0.9 % bolus 500 mL (0 mLs  Intravenous Stopped 05/15/19 1917)  ondansetron (ZOFRAN) injection 4 mg (4 mg Intravenous Given 05/15/19 1811)  pantoprazole (PROTONIX) injection 40 mg (40 mg Intravenous Given 05/15/19 1920)  alum & mag hydroxide-simeth (MAALOX/MYLANTA) 200-200-20 MG/5ML suspension 30 mL (30 mLs Oral Given 05/15/19 1919)  iohexol (OMNIPAQUE) 300 MG/ML solution 100 mL (100 mLs Intravenous Contrast Given 05/15/19 2033)  alum & mag hydroxide-simeth (MAALOX/MYLANTA) 200-200-20 MG/5ML suspension 30 mL (30 mLs Oral Given 05/15/19 2055)    ED Course  I have reviewed the triage vital signs and the nursing notes.  Pertinent labs & imaging results that were available during my care of the patient were reviewed by me and considered in my medical decision making (see chart for details).  Clinical Course as of May 14 2250  Thu May 15, 2019  [place] Consulted hospitalist who asked that we add on a chest x-ray but will see patient and admit for his electrolyte derangement.    [GG]    Clinical Course User Index [GG] Corena Herter, PA-C   MDM Rules/Calculators/A&P                      Patient's lab work is significant for hyponatremia to 112, decreased from 135 and labs obtained merely 13 days ago.  Patient is alert and oriented on my examination and is not obtunded or reporting any seizure activity.  1 L normal saline provided including the 500 mL given by EMS.  Will obtain urine osmolality, phosphorus, and magnesium in addition to basic labs.  Patient is on HCTZ which his wife states that he first started taking four weeks ago and may have played a role in precipitating his hyponatremia.   Obtained CT abdomen and pelvis given his near constant eructation, reported  diminished stool, and intermittent nausea symptoms.  Patient was given Zofran, IV Protonix, and Maalox which effectively improved his symptoms of reflux, eructation, and nausea.  CT abdomen pelvis demonstrates a large amount of stool throughout the ascending,  transverse, and descending colon, however no evidence of an obstruction at this time.  Will consult hospitalist services for admission due to his hypokalemia.  Consulted hospitalist, Dr. Criss Alvine, who asked that we add on a chest x-ray but will see patient and admit for his electrolyte derangement.   Final Clinical Impression(s) / ED Diagnoses Final diagnoses:  Hyponatremia    Rx / DC Orders ED Discharge Orders    None       Corena Herter, PA-C 05/15/19 2253    Daleen Bo, MD 05/16/19 1343

## 2019-05-15 NOTE — Telephone Encounter (Signed)
Okay to try zofran- I thought he tried that w/o effect.   Then try B6.  Then use diazepam if needed but I wouldn't inc the dose.  Thanks.

## 2019-05-15 NOTE — Telephone Encounter (Signed)
[facility] lab called a critical result @ 1600  Sodium 113, repeat testing was done to confirm and 113 was again the result

## 2019-05-15 NOTE — ED Notes (Signed)
Pt still complains of n/v and burping.  Pt observed dry heaving.  Pt observed burping continuously.  Pt states that it burns.

## 2019-05-15 NOTE — ED Triage Notes (Addendum)
Pt arrived via [facility] from home, PCP wanted him to come, because of hyponatremia. Pt c/o N/Vx1wk, constipationx1wk. Had abdomenal x-ray today and was neg for bowel obstruction. Pt drank magnesium citrate today. Pt vomited 3 times within the hour. Pt c/o indigestion. Pt states he had a small had bm today. EMS gave zofran 4mg  and NS 573mL. Pt keeps belching. Pt is A&Ox4.

## 2019-05-15 NOTE — Telephone Encounter (Signed)
He can hold the pramipexole for a week and then restart the titration as previously directed.  I feel sure its not the pramipexole.  He needs to get to PCP for the nausea and maybe GI if chronic nausea

## 2019-05-15 NOTE — ED Notes (Signed)
Pt called out requesting something for heartburn

## 2019-05-16 ENCOUNTER — Encounter (HOSPITAL_COMMUNITY): Payer: Self-pay | Admitting: Emergency Medicine

## 2019-05-16 DIAGNOSIS — E871 Hypo-osmolality and hyponatremia: Principal | ICD-10-CM

## 2019-05-16 LAB — BASIC METABOLIC PANEL
Anion gap: 10 (ref 5–15)
Anion gap: 10 (ref 5–15)
Anion gap: 9 (ref 5–15)
Anion gap: 9 (ref 5–15)
BUN: 8 mg/dL (ref 8–23)
BUN: 7 mg/dL — ABNORMAL LOW (ref 8–23)
BUN: 9 mg/dL (ref 8–23)
BUN: 9 mg/dL (ref 8–23)
CO2: 21 mmol/L — ABNORMAL LOW (ref 22–32)
CO2: 22 mmol/L (ref 22–32)
CO2: 22 mmol/L (ref 22–32)
CO2: 22 mmol/L (ref 22–32)
Calcium: 8.2 mg/dL — ABNORMAL LOW (ref 8.9–10.3)
Calcium: 8.5 mg/dL — ABNORMAL LOW (ref 8.9–10.3)
Calcium: 8.3 mg/dL — ABNORMAL LOW (ref 8.9–10.3)
Calcium: 8.3 mg/dL — ABNORMAL LOW (ref 8.9–10.3)
Chloride: 84 mmol/L — ABNORMAL LOW (ref 98–111)
Chloride: 88 mmol/L — ABNORMAL LOW (ref 98–111)
Chloride: 90 mmol/L — ABNORMAL LOW (ref 98–111)
Chloride: 92 mmol/L — ABNORMAL LOW (ref 98–111)
Creatinine, Ser: 0.92 mg/dL (ref 0.61–1.24)
Creatinine, Ser: 0.95 mg/dL (ref 0.61–1.24)
Creatinine, Ser: 1.1 mg/dL (ref 0.61–1.24)
Creatinine, Ser: 1.15 mg/dL (ref 0.61–1.24)
GFR calc Af Amer: >60 mL/min (ref >60)
GFR calc Af Amer: >60 mL/min (ref >60)
GFR calc Af Amer: >60 mL/min (ref >60)
GFR calc Af Amer: >60 mL/min (ref >60)
GFR calc non Af Amer: >60 mL/min (ref >60)
GFR calc non Af Amer: >60 mL/min (ref >60)
GFR calc non Af Amer: >60 mL/min (ref >60)
GFR calc non Af Amer: >60 mL/min (ref >60)
Glucose, Bld: 93 mg/dL (ref 70–99)
Glucose, Bld: 87 mg/dL (ref 70–99)
Glucose, Bld: 104 mg/dL — ABNORMAL HIGH (ref 70–99)
Glucose, Bld: 101 mg/dL — ABNORMAL HIGH (ref 70–99)
Potassium: 3.9 mmol/L (ref 3.5–5.1)
Potassium: 4 mmol/L (ref 3.5–5.1)
Potassium: 3.8 mmol/L (ref 3.5–5.1)
Potassium: 4.1 mmol/L (ref 3.5–5.1)
Sodium: 115 mmol/L — CL (ref 135–145)
Sodium: 120 mmol/L — ABNORMAL LOW (ref 135–145)
Sodium: 121 mmol/L — ABNORMAL LOW (ref 135–145)
Sodium: 123 mmol/L — ABNORMAL LOW (ref 135–145)

## 2019-05-16 LAB — CBC
HCT: 34.7 % — ABNORMAL LOW (ref 39.0–52.0)
Hemoglobin: 13 g/dL (ref 13.0–17.0)
MCH: 30.9 pg (ref 26.0–34.0)
MCHC: 37.5 g/dL — ABNORMAL HIGH (ref 30.0–36.0)
MCV: 82.4 fL (ref 80.0–100.0)
Platelets: 215 10*3/uL (ref 150–400)
RBC: 4.21 MIL/uL — ABNORMAL LOW (ref 4.22–5.81)
RDW: 11.3 % — ABNORMAL LOW (ref 11.5–15.5)
WBC: 11.8 10*3/uL — ABNORMAL HIGH (ref 4.0–10.5)
nRBC: 0 % (ref 0.0–0.2)

## 2019-05-16 LAB — SARS CORONAVIRUS 2 (TAT 6-24 HRS): SARS Coronavirus 2: NEGATIVE

## 2019-05-16 LAB — URIC ACID: Uric Acid, Serum: 3.8 mg/dL (ref 3.7–8.6)

## 2019-05-16 LAB — C DIFFICILE QUICK SCREEN W PCR REFLEX
C Diff antigen: NEGATIVE
C Diff interpretation: NOT DETECTED
C Diff toxin: NEGATIVE

## 2019-05-16 LAB — OSMOLALITY, URINE: Osmolality, Ur: 84 mOsm/kg — ABNORMAL LOW (ref 300–900)

## 2019-05-16 LAB — SODIUM, URINE, RANDOM: Sodium, Ur: 18 mmol/L

## 2019-05-16 LAB — OSMOLALITY: Osmolality: 244 mOsm/kg — CL (ref 275–295)

## 2019-05-16 MED ORDER — ALUM & MAG HYDROXIDE-SIMETH 200-200-20 MG/5ML PO SUSP
30.0000 mL | ORAL | Status: DC | PRN
  Administered 2019-05-16: 30 mL via ORAL
  Filled 2019-05-16: qty 30

## 2019-05-16 MED ORDER — LORATADINE 10 MG PO TABS
10.0000 mg | ORAL_TABLET | Freq: Every day | ORAL | Status: DC | PRN
  Administered 2019-05-16: 10 mg via ORAL
  Filled 2019-05-16: qty 1

## 2019-05-16 MED ORDER — DEXTROSE-NACL 5-0.45 % IV SOLN: INTRAVENOUS | Status: DC

## 2019-05-16 MED ORDER — AMLODIPINE BESYLATE 5 MG PO TABS
5.0000 mg | ORAL_TABLET | Freq: Every day | ORAL | Status: DC
  Administered 2019-05-16: 5 mg via ORAL
  Filled 2019-05-16: qty 1

## 2019-05-16 MED ORDER — METOPROLOL SUCCINATE ER 50 MG PO TB24
50.0000 mg | ORAL_TABLET | Freq: Every day | ORAL | Status: DC
  Administered 2019-05-17: 50 mg via ORAL
  Filled 2019-05-16: qty 1

## 2019-05-16 NOTE — Progress Notes (Addendum)
PROGRESS NOTE    [name]  [id number] DOB: 1946-02-06 DOA: 05/15/2019 PCP: Tonia Ghent, MD     Brief Narrative:  Patient is a 74 year old male with a past medical history significant for GERD, atrial fibrillation, hypertension, Parkinson's disease, obstructive sleep apnea on CPAP who was sent in by his PCP after all lab draw showed a sodium of 112.  Over the past week, he has not been eating or drinking as he normally does.  He has been having abdominal pain ever since surgery, he has had issues with constipation.  Imaging in the ER showed stool buildup.   New events last 24 hours / Subjective: Wife is bedside.  Patient sodium on admission was 112.  At the 6-hour mark, it did increase to 115.  At the next 6-hour mark, it had increased to 120.  He was changed from normal saline to D5 half-normal saline infusion.  He never had any cognitive changes.  This continues.  He feels well overall.  Still having some abdominal issues.  He has been taking magnesium citrate at home.  Assessment & Plan:   Principal Problem:   Hyponatremia  Continue D5 half-normal saline  BMP every 6 hours  Do not exceed a rate of 6 mEq per 12 hours  Low urine osmolality less suggestive of SIADH, given poor oral intake and improvement with fluids, likely hypovolemia  Active Problems:   HTN (hypertension)  Will stop losartan and hydrochlorothiazide  Will order Toprol XL 50 mg daily as a replacement   OSA on CPAP    Neural foraminal stenosis of lumbosacral spine    Parkinson disease (HCC)  Mirapex 0.125 mg 3 times daily    Atrial fibrillation, chronic (HCC)  Continue Xarelto 20 mg daily  To Cardizem 180 mg daily   DVT prophylaxis: Xarelto Code Status: Full Family Communication: Self and wife Disposition Plan: Home; coming from home, no identifiable barriers to discharge at this time   Consultants:   None    Objective: Vitals:   05/16/19 0119 05/16/19 0400 05/16/19 0459 05/16/19  0841  BP: (!) 147/72 125/82 123/75 131/72  Pulse: 93 89 85 98  Resp: 19 18  18   Temp:  98.6 F (37 C)  97.8 F (36.6 C)  TempSrc:  Oral  Oral  SpO2: 95% 98% 97% 99%  Weight:  93.9 kg    Height:        Intake/Output Summary (Last 24 hours) at 05/16/2019 1451 Last data filed at 05/16/2019 1400 Gross per 24 hour  Intake 1499.75 ml  Output 4425 ml  Net -2925.25 ml   Filed Weights   05/15/19 1800 05/16/19 0400  Weight: 97.1 kg 93.9 kg    Examination:  General exam: Appears calm and comfortable  Respiratory system: Clear to auscultation. Respiratory effort normal. No respiratory distress. No conversational dyspnea.  Cardiovascular system: S1 & S2 heard, irregularly irregular rhythm, regular rate. No murmurs. No pedal edema. Gastrointestinal system: Abdomen is nondistended, soft and nontender. Normal bowel sounds heard. Central nervous system: Alert and oriented. + Resting tremor, most pronounced in the right upper extremity. Extremities: Symmetric in appearance  Skin: No rashes, lesions or ulcers on exposed skin  Psychiatry: Judgement and insight appear normal. Mood & affect appropriate.   Data Reviewed: I have personally reviewed following labs and imaging studies  CBC: Recent Labs  Lab 05/15/19 1013 05/16/19 0210  WBC 11.7* 11.8*  NEUTROABS 8.9*  --   HGB 13.0 13.0  HCT 36.8* 34.7*  MCV 86.1 82.4  PLT 271.0 123456   Basic Metabolic Panel: Recent Labs  Lab 05/15/19 1013 05/15/19 1803 05/15/19 1944 05/16/19 0210 05/16/19 0706 05/16/19 1328  NA 113 Repeated and verified X2.*  --  112* 115* 120* 121*  K 4.1  --  4.2 3.9 4.0 3.8  CL 81*  --  81* 84* 88* 90*  CO2 24  --  20* 21* 22 22  GLUCOSE 110*  --  93 93 87 104*  BUN 7  --  8 8 7* 9  CREATININE 0.90  --  0.91 0.92 0.95 1.10  CALCIUM 8.8  --  7.7* 8.2* 8.5* 8.3*  MG  --  1.4*  --   --   --   --   PHOS  --  2.5  --   --   --   --    GFR: Estimated Creatinine Clearance: 72.6 mL/min (by C-G formula based on SCr  of 1.1 mg/dL). Liver Function Tests: Recent Labs  Lab 05/15/19 1013  AST 19  ALT 15  ALKPHOS 77  BILITOT 0.9  PROT 6.4  ALBUMIN 4.2   Recent Labs  Lab 05/15/19 1013  LIPASE 20.0   Recent Results (from the past 240 hour(s))  SARS CORONAVIRUS 2 (TAT 6-24 HRS) Nasopharyngeal Nasopharyngeal Swab     Status: None   Collection Time: 05/16/19  2:39 AM   Specimen: Nasopharyngeal Swab  Result Value Ref Range Status   SARS Coronavirus 2 NEGATIVE NEGATIVE Final    Comment: (NOTE) SARS-CoV-2 target nucleic acids are NOT DETECTED. The SARS-CoV-2 RNA is generally detectable in upper and lower respiratory specimens during the acute phase of infection. Negative results do not preclude SARS-CoV-2 infection, do not rule out co-infections with other pathogens, and should not be used as the sole basis for treatment or other patient management decisions. Negative results must be combined with clinical observations, patient history, and epidemiological information. The expected result is Negative. Fact Sheet for Patients: SugarRoll.be Fact Sheet for Healthcare Providers: https://www.woods-mathews.com/ This test is not yet approved or cleared by the [place] FDA and  has been authorized for detection and/or diagnosis of SARS-CoV-2 by FDA under an Emergency Use Authorization (EUA). This EUA will remain  in effect (meaning this test can be used) for the duration of the COVID-19 declaration under Section 56 4(b)(1) of the Act, 21 U.S.C. section 360bbb-3(b)(1), unless the authorization is terminated or revoked sooner. Performed at [facility], [place] [address] [place], [place] [address]   C difficile quick scan w PCR reflex     Status: None   Collection Time: 05/16/19  8:12 AM   Specimen: STOOL  Result Value Ref Range Status   C Diff antigen NEGATIVE NEGATIVE Final   C Diff toxin NEGATIVE NEGATIVE Final   C Diff interpretation No C.  difficile detected.  Final    Comment: Performed at [facility], [place] [address] [place], [place] [address]      Radiology Studies: DG Abd 1 View  Result Date: 05/15/2019 CLINICAL DATA:  Nausea EXAM: ABDOMEN - 1 VIEW COMPARISON:  CT abdomen pelvis 11/20/2006 FINDINGS: Nonobstructive bowel gas pattern. Moderate stool in the colon. No dilated bowel loops. No abnormal calcifications. Lumbar fusion L4-5 and L5-S1. Mild degenerative change in both hips. IMPRESSION: Moderate stool in the colon. Electronically Signed   By: Franchot Gallo M.D.   On: 05/15/2019 10:55   CT Abdomen Pelvis W Contrast  Result Date: 05/15/2019 CLINICAL DATA:  Hyponatremia and constipation. EXAM: CT ABDOMEN AND PELVIS WITH CONTRAST TECHNIQUE: Multidetector CT imaging of the abdomen and pelvis was performed using the standard protocol following bolus administration of intravenous contrast. CONTRAST:  160mL OMNIPAQUE IOHEXOL 300 MG/ML  SOLN COMPARISON:  November 20, 2006 FINDINGS: Lower chest: No acute abnormality. Hepatobiliary: Mild diffuse fatty infiltration of the liver parenchyma is seen. A punctate calcified granuloma is noted within the anterior aspect of the right lobe of the liver. No gallstones, gallbladder wall thickening, or biliary dilatation. Pancreas: Unremarkable. No pancreatic ductal dilatation or surrounding inflammatory changes. Spleen: Normal in size without focal abnormality. Adrenals/Urinary Tract: Adrenal glands are unremarkable. Kidneys are normal, without renal calculi or hydronephrosis. Bilateral 6 mm renal cysts are seen. Bladder is unremarkable. Stomach/Bowel: Stomach is within normal limits. The appendix is not identified. No evidence of bowel dilatation. A large amount of stool is seen throughout the ascending, transverse and descending colon. Vascular/Lymphatic: Marked severity aortic atherosclerosis. No enlarged abdominal or pelvic lymph nodes. Reproductive: The prostate gland is moderately enlarged.  Other: No abdominal wall hernia or abnormality. No abdominopelvic ascites. Musculoskeletal: Bilateral pedicle screws are seen within the mid and lower lumbar spine. There is associated streak artifact and subsequently limited evaluation of the adjacent osseous and soft tissue structures. This represents a new finding when compared to the prior study. IMPRESSION: 1. Large amount of stool throughout the ascending, transverse and descending colon. 2. Marked severity aortic atherosclerosis. 3. Postoperative changes within the mid and lower lumbar spine. Aortic Atherosclerosis (ICD10-I70.0). Electronically Signed   By: Virgina Norfolk M.D.   On: 05/15/2019 20:49   DG Chest Portable 1 View  Result Date: 05/15/2019 CLINICAL DATA:  Belching.  Nausea, vomiting. EXAM: PORTABLE CHEST 1 VIEW COMPARISON:  04/24/2019 FINDINGS: Heart and mediastinal contours are within normal limits. No focal opacities or effusions. No acute bony abnormality. IMPRESSION: No active cardiopulmonary disease. Electronically Signed   By: Rolm Baptise M.D.   On: 05/15/2019 22:46      Scheduled Meds: . diltiazem  180 mg Oral Daily  . pramipexole  0.125 mg Oral TID  . rivaroxaban  20 mg Oral Q supper   Continuous Infusions: . dextrose 5 % and 0.45% NaCl 75 mL/hr at 05/16/19 1056     LOS: 1 day    Time spent: 30 minutes   Shelda Pal, DO [facility] 05/16/2019, 2:51 PM   Available via Epic secure chat 7am-7pm After these hours, please refer to coverage provider listed on [email or web address]

## 2019-05-16 NOTE — ED Notes (Signed)
Pt to be held in the ED for infusion of 3% hypertonic saline at 50 ml/hr for a total of 150 ml. MD has requested repeat BMP 30 minutes after infusion, and to call him with results of BMP.MD aware of necessity to hold pt in ED for infusion, as it can not be given on tele floor.

## 2019-05-16 NOTE — Telephone Encounter (Signed)
See follow-up phone note. 

## 2019-05-16 NOTE — Progress Notes (Signed)
New Admission Note: ? Arrival Method: Stretcher  Mental Orientation: Alert and Oriented x 4 Telemetry: Box 17 Assessment: Completed Skin: Refer to flowsheet IV: Left Antecubital  Pain: 0/10 Tubes: None  Safety Measures: Safety Fall Prevention Plan discussed with patient. Admission: Completed [facility] [place] Orientation: Patient has been orientated to the room, unit and the staff. Family: None at the bedside at this time Orders have been reviewed and are being implemented. Will continue to monitor the patient. Call light has been placed within reach and bed alarm has been activated.  ? Milagros Loll, RN  Phone Number: [phone]

## 2019-05-16 NOTE — ED Notes (Signed)
Called report to charge nurse on [facility]

## 2019-05-16 NOTE — H&P (Signed)
History and Physical    [name] UJW:[id number] DOB: [birth year] DOA: 05/15/2019  PCP: Tonia Ghent, MD    Patient coming from: Home    Chief Complaint: Sent by primary doctor to be evaluated for hyponatremia  HPI: [name] is a 74 y.o. male with medical history significant of GERD, chronic atrial fibrillation, essential hypertension, obstructive sleep apnea on CPAP, , Parkinson's disease came to be evaluated for low sodium.  ED Course:  In the emergency room He was found to have a sodium 112 Normal BUN and creatinine CT abdomen and pelvis constipation Chest x-ray negative Patient is not symptomatic his mental status is normal Plan start on hypertonic then IV fluids normal saline with fluid restriction Do not correct faster than 1 mEq/h or 8 to 12 mEq in 24 hours  Review of Systems: As per HPI otherwise 10 point review of systems negative.  With exception of some anxiety and tremor right hand  Past Medical History:  Diagnosis Date  . Arthritis   . GERD (gastroesophageal reflux disease)   . H/O urinary frequency   . Heart murmur    as a child  . History of bronchitis   . HSV infection    oral  . Hypertension    improved after back pain treated with surgery  . Insomnia   . Joint pain   . OSA on CPAP    he ordered kit and started use 2017 w/o formal testing, see note from 11/30/15.   . Vertigo 10/13/2014   recurrent; trigger is cool air on left ear, improved with daily antihistamine    Past Surgical History:  Procedure Laterality Date  . ACHILLES TENDON REPAIR Left   . BACK SURGERY    . COLONOSCOPY  02/2011   Negative,[facility]  . DEBRIDEMENT AND CLOSURE WOUND Left 02/25/2018   Procedure: DEBRIDEMENT LEFT LEG APPLICATION INTEGRA;  Surgeon: Irene Limbo, MD;  Location: [place];  Service: Plastics;  Laterality: Left;  . EYE SURGERY Bilateral    cataracts  . KNEE ARTHROSCOPY     L  . LUMBAR FUSION     2016  .  LUMBAR SPINE SURGERY  03/31/2019   exploration of lumbar fusion w/L5-S1 decompression redo L4-5 fusion  . SPINE SURGERY  2003   cervical fusion  . TONSILLECTOMY    . varicoelectomy       reports that he has never smoked. He has never used smokeless tobacco. He reports that he does not drink alcohol or use drugs.  Allergies  Allergen Reactions  . Flecainide     Vomit   . Flexeril [Cyclobenzaprine]     Constipation  . Morphine And Related Hives    Hallucinations- can tolerate oxycodone.   . Promethazine     Would avoid due to Parkinsons  . Sinemet [Carbidopa-Levodopa]     intolerant    Family History  Problem Relation Age of Onset  . Hypertension Mother   . Asthma Mother   . Diabetes Father   . Stroke Father   . Heart attack Paternal Grandfather        >55  . Cancer Sister   . Colon cancer Neg Hx   . Prostate cancer Neg Hx      Prior to Admission medications   Medication Sig Start Date End Date Taking? Authorizing Provider  bismuth subsalicylate (PEPTO BISMOL) 262 MG/15ML suspension Take 30 mLs by mouth every 6 (six) hours as needed.   Yes [provider]  diazepam (VALIUM) 5 MG tablet Take 0.5-1 tablets (2.5-5 mg total) by mouth every 12 (twelve) hours as needed (for nausea or sleep. don't take with Lorrin Mais). 05/15/19  Yes Tonia Ghent, MD  diltiazem (CARDIZEM CD) 180 MG 24 hr capsule Take 1 capsule (180 mg total) by mouth daily. 05/14/19 08/12/19 Yes Miquel Dunn, NP  hydrochlorothiazide (MICROZIDE) 12.5 MG capsule Take 1 capsule (12.5 mg total) by mouth daily. 04/26/19  Yes Eugenie Filler, MD  losartan (COZAAR) 50 MG tablet Take 1 tablet (50 mg total) by mouth daily. 04/26/19  Yes Eugenie Filler, MD  Menthol, Topical Analgesic, (BIOFREEZE EX) Apply 1 application topically daily as needed (pain).   Yes [provider]  omeprazole (PRILOSEC) 20 MG capsule Take 20 mg by mouth daily as needed (heart burn).   Yes [provider]    ondansetron (ZOFRAN) 4 MG tablet Take 1 tablet (4 mg total) by mouth every 6 (six) hours as needed for nausea or vomiting. 04/01/19  Yes Bergman, Meghan D, NP  psyllium (REGULOID) 0.52 g capsule Take 0.52 g by mouth daily.   Yes [provider]  rivaroxaban (XARELTO) 20 MG TABS tablet Take 1 tablet (20 mg total) by mouth daily with supper. 04/25/19  Yes Eugenie Filler, MD  zolpidem (AMBIEN) 5 MG tablet Take 1 tablet (5 mg total) by mouth at bedtime as needed for sleep. 05/02/19  Yes Adrian Prows, MD  pramipexole (MIRAPEX) 0.5 MG tablet Take 1 tablet (0.5 mg total) by mouth 3 (three) times daily. 05/12/19   Tat, Eustace Quail, DO  pyridOXINE (VITAMIN B-6) 25 MG tablet Take 1 tablet (25 mg total) by mouth 3 (three) times daily as needed (nausea). 05/15/19   Tonia Ghent, MD    Physical Exam: Vitals:   05/15/19 2215 05/16/19 0119 05/16/19 0400 05/16/19 0459  BP:  (!) 147/72 125/82 123/75  Pulse: 98 93 89 85  Resp:  19 18   Temp:   98.6 F (37 C)   TempSrc:   Oral   SpO2: 96% 95% 98% 97%  Weight:   93.9 kg   Height:        Constitutional: NAD, calm, comfortable Vitals:   05/15/19 2215 05/16/19 0119 05/16/19 0400 05/16/19 0459  BP:  (!) 147/72 125/82 123/75  Pulse: 98 93 89 85  Resp:  19 18   Temp:   98.6 F (37 C)   TempSrc:   Oral   SpO2: 96% 95% 98% 97%  Weight:   93.9 kg   Height:       Eyes: PERRL, lids and conjunctivae normal ENMT: Mucous membranes are moist. Posterior pharynx clear of any exudate or lesions.Normal dentition.  Neck: normal, supple, no masses, no thyromegaly Respiratory: clear to auscultation bilaterally, no wheezing, no crackles. Normal respiratory effort. No accessory muscle use.  Cardiovascular: Irregular, no murmurs / rubs / gallops. No extremity edema. 2+ pedal pulses. No carotid bruits.  Abdomen: no tenderness, no masses palpated. No hepatosplenomegaly. Bowel sounds positive.  Musculoskeletal: no clubbing / cyanosis. No joint deformity upper and  lower extremities. Good ROM, no contractures. Normal muscle tone.  Skin: no rashes, lesions, ulcers. No induration Neurologic: CN 2-12 grossly intact. Sensation intact, DTR normal. Strength 5/5 in all 4.  Psychiatric: Normal judgment and insight. Alert and oriented x 3. Normal mood.    Labs on Admission: I have personally reviewed following labs and imaging studies  CBC: Recent Labs  Lab 05/15/19 1013 05/16/19 0210  WBC  11.7* 11.8*  NEUTROABS 8.9*  --   HGB 13.0 13.0  HCT 36.8* 34.7*  MCV 86.1 82.4  PLT 271.0 250   Basic Metabolic Panel: Recent Labs  Lab 05/15/19 1013 05/15/19 1803 05/15/19 1944 05/16/19 0210  NA 113 Repeated and verified X2.*  --  112* 115*  K 4.1  --  4.2 3.9  CL 81*  --  81* 84*  CO2 24  --  20* 21*  GLUCOSE 110*  --  93 93  BUN 7  --  8 8  CREATININE 0.90  --  0.91 0.92  CALCIUM 8.8  --  7.7* 8.2*  MG  --  1.4*  --   --   PHOS  --  2.5  --   --    GFR: Estimated Creatinine Clearance: 86.7 mL/min (by C-G formula based on SCr of 0.92 mg/dL). Liver Function Tests: Recent Labs  Lab 05/15/19 1013  AST 19  ALT 15  ALKPHOS 77  BILITOT 0.9  PROT 6.4  ALBUMIN 4.2   Recent Labs  Lab 05/15/19 1013  LIPASE 20.0   No results for input(s): AMMONIA in the last 168 hours. Coagulation Profile: No results for input(s): INR, PROTIME in the last 168 hours. Cardiac Enzymes: No results for input(s): CKTOTAL, CKMB, CKMBINDEX, TROPONINI in the last 168 hours. BNP (last 3 results) No results for input(s): PROBNP in the last 8760 hours. HbA1C: No results for input(s): HGBA1C in the last 72 hours. CBG: No results for input(s): GLUCAP in the last 168 hours. Lipid Profile: No results for input(s): CHOL, HDL, LDLCALC, TRIG, CHOLHDL, LDLDIRECT in the last 72 hours. Thyroid Function Tests: No results for input(s): TSH, T4TOTAL, FREET4, T3FREE, THYROIDAB in the last 72 hours. Anemia Panel: No results for input(s): VITAMINB12, FOLATE, FERRITIN, TIBC, IRON,  RETICCTPCT in the last 72 hours. Urine analysis:    Component Value Date/Time   COLORURINE YELLOW 05/15/2019 1803   APPEARANCEUR CLOUDY (A) 05/15/2019 1803   LABSPEC 1.014 05/15/2019 1803   PHURINE 8.0 05/15/2019 1803   GLUCOSEU NEGATIVE 05/15/2019 1803   GLUCOSEU NEGATIVE 05/02/2019 1054   HGBUR NEGATIVE 05/15/2019 1803   BILIRUBINUR NEGATIVE 05/15/2019 1803   BILIRUBINUR Neg 04/14/2016 1032   KETONESUR 20 (A) 05/15/2019 1803   PROTEINUR 30 (A) 05/15/2019 1803   UROBILINOGEN 0.2 05/02/2019 1054   NITRITE NEGATIVE 05/15/2019 1803   LEUKOCYTESUR NEGATIVE 05/15/2019 1803    Radiological Exams on Admission: DG Abd 1 View  Result Date: 05/15/2019 CLINICAL DATA:  Nausea EXAM: ABDOMEN - 1 VIEW COMPARISON:  CT abdomen pelvis 11/20/2006 FINDINGS: Nonobstructive bowel gas pattern. Moderate stool in the colon. No dilated bowel loops. No abnormal calcifications. Lumbar fusion L4-5 and L5-S1. Mild degenerative change in both hips. IMPRESSION: Moderate stool in the colon. Electronically Signed   By: Franchot Gallo M.D.   On: 05/15/2019 10:55   CT Abdomen Pelvis W Contrast  Result Date: 05/15/2019 CLINICAL DATA:  Hyponatremia and constipation. EXAM: CT ABDOMEN AND PELVIS WITH CONTRAST TECHNIQUE: Multidetector CT imaging of the abdomen and pelvis was performed using the standard protocol following bolus administration of intravenous contrast. CONTRAST:  141m OMNIPAQUE IOHEXOL 300 MG/ML  SOLN COMPARISON:  November 20, 2006 FINDINGS: Lower chest: No acute abnormality. Hepatobiliary: Mild diffuse fatty infiltration of the liver parenchyma is seen. A punctate calcified granuloma is noted within the anterior aspect of the right lobe of the liver. No gallstones, gallbladder wall thickening, or biliary dilatation. Pancreas: Unremarkable. No pancreatic ductal dilatation or surrounding inflammatory changes.  Spleen: Normal in size without focal abnormality. Adrenals/Urinary Tract: Adrenal glands are unremarkable.  Kidneys are normal, without renal calculi or hydronephrosis. Bilateral 6 mm renal cysts are seen. Bladder is unremarkable. Stomach/Bowel: Stomach is within normal limits. The appendix is not identified. No evidence of bowel dilatation. A large amount of stool is seen throughout the ascending, transverse and descending colon. Vascular/Lymphatic: Marked severity aortic atherosclerosis. No enlarged abdominal or pelvic lymph nodes. Reproductive: The prostate gland is moderately enlarged. Other: No abdominal wall hernia or abnormality. No abdominopelvic ascites. Musculoskeletal: Bilateral pedicle screws are seen within the mid and lower lumbar spine. There is associated streak artifact and subsequently limited evaluation of the adjacent osseous and soft tissue structures. This represents a new finding when compared to the prior study. IMPRESSION: 1. Large amount of stool throughout the ascending, transverse and descending colon. 2. Marked severity aortic atherosclerosis. 3. Postoperative changes within the mid and lower lumbar spine. Aortic Atherosclerosis (ICD10-I70.0). Electronically Signed   By: Virgina Norfolk M.D.   On: 05/15/2019 20:49   DG Chest Portable 1 View  Result Date: 05/15/2019 CLINICAL DATA:  Belching.  Nausea, vomiting. EXAM: PORTABLE CHEST 1 VIEW COMPARISON:  04/24/2019 FINDINGS: Heart and mediastinal contours are within normal limits. No focal opacities or effusions. No acute bony abnormality. IMPRESSION: No active cardiopulmonary disease. Electronically Signed   By: Rolm Baptise M.D.   On: 05/15/2019 22:46    EKG: Independently reviewed.  Atrial fibrillation no acute ST-T changes  Assessment and plan  Hyponatremia Probably SIADH or secondary to his medication Plan SIADH work-up Urine osmolality blood osmolarity urine sodium, uric acid Plan fluid restriction /IV fluids correct sodium not more than 1 mEq/h Electrolytes every 6 hours  Chronic atrial fibrillation rate control On  Cardizem and Xarelto  GERD PPI  Essential hypertension Resume home medication  OSA on CPAP  Parkinson disease Resume home medication, check side effects sodium     Assessment/Plan Principal Problem:   Hyponatremia Active Problems:   GERD (gastroesophageal reflux disease)   HTN (hypertension)   OSA on CPAP   Lower back pain   Neural foraminal stenosis of lumbosacral spine   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HCC)   Parkinson disease (HCC)   Atrial fibrillation, chronic (HCC)      DVT prophylaxis: Xarelto Code Status: Full code Family Communication:   Disposition Plan:   Consults called:   Admission status:     Canary Fister G Terie Lear MD [facility]  If 7PM-7AM, please contact night-coverage [email or web address]   05/16/2019, 6:34 AM

## 2019-05-16 NOTE — Plan of Care (Signed)
  Problem: Clinical Measurements: Goal: Ability to maintain clinical measurements within normal limits will improve Outcome: Progressing   

## 2019-05-16 NOTE — Progress Notes (Signed)
I was called by nursing staff for a second opinion regarding EKG interpretation specifically regarding VT alarms. EKG alarms reviewed and pt was having what looked like artifact vs [place] VT. When I assessed the patient he was easily arousable, pink skin color and he has a persistent twitch in his right hand which was consistently placed over his red lead creating artifact that looks like VT.

## 2019-05-16 NOTE — Progress Notes (Signed)
Telemetry Tech called to notify this RN of "abnormal rhythm". Tech was informed about patient having tremors due to Parkinson's disease. Upon assessment. patient  alert and oriented with stable vital signs. Rapid Response Nurse Shanon Brow was called to bedside at this time to assess patient and to identify the accuracy of rhythm. Shanon Brow, RN assessed patient and encouraged that repositioning patient's hands may help with preventing artifacts reading in cardiac rhythm. Will continue to monitor.   Travell Desaulniers,RN.

## 2019-05-16 NOTE — ED Notes (Signed)
Notified Cristescu, MD about critical lab value. He advised no more 3% NS needed and pt can go to floor.

## 2019-05-16 NOTE — Telephone Encounter (Signed)
Patient has gone to emergency room and per EMR he will be admitted.  Appreciate the help of all involved.

## 2019-05-16 NOTE — Progress Notes (Signed)
Report received. Room is ready.  

## 2019-05-16 NOTE — ED Notes (Signed)
Critical value called from lab. Na = 115 per Gaspar Cola.

## 2019-05-17 LAB — BASIC METABOLIC PANEL
Anion gap: 12 (ref 5–15)
Anion gap: 8 (ref 5–15)
Anion gap: 9 (ref 5–15)
BUN: 10 mg/dL (ref 8–23)
BUN: 10 mg/dL (ref 8–23)
BUN: 8 mg/dL (ref 8–23)
CO2: 22 mmol/L (ref 22–32)
CO2: 23 mmol/L (ref 22–32)
CO2: 24 mmol/L (ref 22–32)
Calcium: 8.3 mg/dL — ABNORMAL LOW (ref 8.9–10.3)
Calcium: 8.3 mg/dL — ABNORMAL LOW (ref 8.9–10.3)
Calcium: 8.5 mg/dL — ABNORMAL LOW (ref 8.9–10.3)
Chloride: 95 mmol/L — ABNORMAL LOW (ref 98–111)
Chloride: 95 mmol/L — ABNORMAL LOW (ref 98–111)
Chloride: 97 mmol/L — ABNORMAL LOW (ref 98–111)
Creatinine, Ser: 0.97 mg/dL (ref 0.61–1.24)
Creatinine, Ser: 1.03 mg/dL (ref 0.61–1.24)
Creatinine, Ser: 1.05 mg/dL (ref 0.61–1.24)
GFR calc Af Amer: >60 mL/min (ref >60)
GFR calc Af Amer: >60 mL/min (ref >60)
GFR calc Af Amer: >60 mL/min (ref >60)
GFR calc non Af Amer: >60 mL/min (ref >60)
GFR calc non Af Amer: >60 mL/min (ref >60)
GFR calc non Af Amer: >60 mL/min (ref >60)
Glucose, Bld: 103 mg/dL — ABNORMAL HIGH (ref 70–99)
Glucose, Bld: 103 mg/dL — ABNORMAL HIGH (ref 70–99)
Glucose, Bld: 117 mg/dL — ABNORMAL HIGH (ref 70–99)
Potassium: 3.8 mmol/L (ref 3.5–5.1)
Potassium: 3.9 mmol/L (ref 3.5–5.1)
Potassium: 3.9 mmol/L (ref 3.5–5.1)
Sodium: 127 mmol/L — ABNORMAL LOW (ref 135–145)
Sodium: 129 mmol/L — ABNORMAL LOW (ref 135–145)
Sodium: 129 mmol/L — ABNORMAL LOW (ref 135–145)

## 2019-05-17 LAB — CBC
HCT: 36.4 % — ABNORMAL LOW (ref 39.0–52.0)
Hemoglobin: 13.3 g/dL (ref 13.0–17.0)
MCH: 30.9 pg (ref 26.0–34.0)
MCHC: 36.5 g/dL — ABNORMAL HIGH (ref 30.0–36.0)
MCV: 84.7 fL (ref 80.0–100.0)
Platelets: 197 10*3/uL (ref 150–400)
RBC: 4.3 MIL/uL (ref 4.22–5.81)
RDW: 11.9 % (ref 11.5–15.5)
WBC: 7 10*3/uL (ref 4.0–10.5)
nRBC: 0 % (ref 0.0–0.2)

## 2019-05-17 MED ORDER — SENNA 8.6 MG PO TABS: 1.0000 | ORAL_TABLET | Freq: Every day | ORAL | 0 refills | Status: DC

## 2019-05-17 MED ORDER — RIVAROXABAN 20 MG PO TABS: 20.0000 mg | ORAL_TABLET | Freq: Every day | ORAL | 3 refills | Status: DC

## 2019-05-17 MED ORDER — VITAMIN C 250 MG PO TABS: 500.0000 mg | ORAL_TABLET | Freq: Every day | ORAL | Status: DC

## 2019-05-17 NOTE — Progress Notes (Signed)
DISCHARGE NOTE HOME [name] to be discharged Home per MD order. Discussed prescriptions and follow up appointments with the patient. Prescriptions given to patient; medication list explained in detail. Patient verbalized understanding.  Skin clean, dry and intact without evidence of skin break down, no evidence of skin tears noted. IV catheter discontinued intact. Site without signs and symptoms of complications. Dressing and pressure applied. Pt denies pain at the site currently. No complaints noted.  Patient free of lines, drains, and wounds.   An After Visit Summary (AVS) was printed and given to the patient. Patient escorted via wheelchair, and discharged home via private auto.  Aneta Mins BSN, RN3

## 2019-05-17 NOTE — Plan of Care (Signed)
  Problem: Education: Goal: Knowledge of General Education information will improve Description Including pain rating scale, medication(s)/side effects and non-pharmacologic comfort measures Outcome: Progressing   

## 2019-05-17 NOTE — Discharge Summary (Signed)
Physician Discharge Summary  RAYDON KRITZMAN [id number] DOB: Feb 25, 1946 DOA: 05/15/2019  PCP: Tonia Ghent, MD  Admit date: 05/15/2019 Discharge date: 05/17/2019  Admitted From: Home Disposition: Home Recommendations for Outpatient Follow-up:  1. Follow up with PCP in 1-2 weeks 2. Please obtain BMP/CBC 2/2   Home Health: None Equipment/Devices: None  Discharge Condition: Stable and improved CODE STATUS full code Diet recommendation: Cardiac diet Brief/Interim Summary:74 year old male with a past medical history significant for GERD, atrial fibrillation, hypertension, Parkinson's disease, obstructive sleep apnea on CPAP who was sent in by his PCP after all lab draw showed a sodium of 112.  Over the past week, he has not been eating or drinking as he normally does.  He has been having abdominal pain ever since surgery, he has had issues with constipation.  Imaging in the ER showed stool buildup.  Discharge Diagnoses:  Principal Problem:   Hyponatremia Active Problems:   GERD (gastroesophageal reflux disease)   HTN (hypertension)   OSA on CPAP   Lower back pain   Neural foraminal stenosis of lumbosacral spine   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HCC)   Parkinson disease (HCC)   Atrial fibrillation, chronic (HCC)   #1 hypovolemic hyponatremia secondary to nutritional deficiency and hydrochlorothiazide.  Patient's appetite has been less and has been taking less and less p.o. intake a week prior to admission to hospital.  He was treated with IV normal saline his sodium improved from 112 at the time of admission to 129 at the time of discharge.  He was asked to stop taking hydrochlorothiazide. Seen by physical therapy did not recommend home PT.  #2 history of essential hypertension continue losartan and Cardizem.  #3 obstructive sleep apnea on CPAP continue.  #4 Parkinson's disease not on any medications other than Mirapex which the patient  stopped taking due to constipation side effects.  #5 atrial fibrillation chronic continue Xarelto and Cardizem.  #6 constipation advised patient to take senna, MiraLAX vitamin C magnesium etc.  Estimated body mass index is 25.8 kg/m as calculated from the following:   Height as of this encounter: 6\' 3"  (1.905 m).   Weight as of this encounter: 93.6 kg.  Discharge Instructions   Allergies as of 05/17/2019      Reactions   Flecainide    Vomit    Flexeril [cyclobenzaprine]    Constipation   Morphine And Related Hives   Hallucinations- can tolerate oxycodone.    Promethazine    Would avoid due to Parkinsons   Sinemet [carbidopa-levodopa]    intolerant      Medication List    STOP taking these medications   hydrochlorothiazide 12.5 MG capsule Commonly known as: MICROZIDE   pramipexole 0.5 MG tablet Commonly known as: MIRAPEX     TAKE these medications   BIOFREEZE EX Apply 1 application topically daily as needed (pain).   bismuth subsalicylate [id number] suspension Commonly known as: PEPTO BISMOL Take 30 mLs by mouth every 6 (six) hours as needed.   diazepam 5 MG tablet Commonly known as: VALIUM Take 0.5-1 tablets (2.5-5 mg total) by mouth every 12 (twelve) hours as needed (for nausea or sleep. don't take with Lorrin Mais).   diltiazem 180 MG 24 hr capsule Commonly known as: CARDIZEM CD Take 1 capsule (180 mg total) by mouth daily.   losartan 50 MG tablet Commonly known as: COZAAR Take 1 tablet (50 mg total) by mouth daily.   omeprazole 20 MG capsule Commonly known as: PRILOSEC  Take 20 mg by mouth daily as needed (heart burn).   ondansetron 4 MG tablet Commonly known as: ZOFRAN Take 1 tablet (4 mg total) by mouth every 6 (six) hours as needed for nausea or vomiting.   psyllium 0.52 g capsule Commonly known as: REGULOID Take 0.52 g by mouth daily.   pyridOXINE 25 MG tablet Commonly known as: VITAMIN B-6 Take 1 tablet (25 mg total) by mouth 3 (three) times  daily as needed (nausea).   rivaroxaban 20 MG Tabs tablet Commonly known as: XARELTO Take 1 tablet (20 mg total) by mouth daily with supper.   senna 8.6 MG Tabs tablet Commonly known as: SENOKOT Take 1 tablet (8.6 mg total) by mouth at bedtime.   vitamin C 250 MG tablet Commonly known as: ASCORBIC ACID Take 2 tablets (500 mg total) by mouth daily.   zolpidem 5 MG tablet Commonly known as: Ambien Take 1 tablet (5 mg total) by mouth at bedtime as needed for sleep.      Follow-up Information    Tonia Ghent, MD Follow up.   Specialty: Family Medicine Contact information: [place] [address] [phone]          Allergies  Allergen Reactions  . Flecainide     Vomit   . Flexeril [Cyclobenzaprine]     Constipation  . Morphine And Related Hives    Hallucinations- can tolerate oxycodone.   . Promethazine     Would avoid due to Parkinsons  . Sinemet [Carbidopa-Levodopa]     intolerant    Consultations:  None   Procedures/Studies: DG Abd 1 View  Result Date: 05/15/2019 CLINICAL DATA:  Nausea EXAM: ABDOMEN - 1 VIEW COMPARISON:  CT abdomen pelvis 11/20/2006 FINDINGS: Nonobstructive bowel gas pattern. Moderate stool in the colon. No dilated bowel loops. No abnormal calcifications. Lumbar fusion L4-5 and L5-S1. Mild degenerative change in both hips. IMPRESSION: Moderate stool in the colon. Electronically Signed   By: Franchot Gallo M.D.   On: 05/15/2019 10:55   CT Abdomen Pelvis W Contrast  Result Date: 05/15/2019 CLINICAL DATA:  Hyponatremia and constipation. EXAM: CT ABDOMEN AND PELVIS WITH CONTRAST TECHNIQUE: Multidetector CT imaging of the abdomen and pelvis was performed using the standard protocol following bolus administration of intravenous contrast. CONTRAST:  137mL OMNIPAQUE IOHEXOL 300 MG/ML  SOLN COMPARISON:  November 20, 2006 FINDINGS: Lower chest: No acute abnormality. Hepatobiliary: Mild diffuse fatty infiltration of the liver  parenchyma is seen. A punctate calcified granuloma is noted within the anterior aspect of the right lobe of the liver. No gallstones, gallbladder wall thickening, or biliary dilatation. Pancreas: Unremarkable. No pancreatic ductal dilatation or surrounding inflammatory changes. Spleen: Normal in size without focal abnormality. Adrenals/Urinary Tract: Adrenal glands are unremarkable. Kidneys are normal, without renal calculi or hydronephrosis. Bilateral 6 mm renal cysts are seen. Bladder is unremarkable. Stomach/Bowel: Stomach is within normal limits. The appendix is not identified. No evidence of bowel dilatation. A large amount of stool is seen throughout the ascending, transverse and descending colon. Vascular/Lymphatic: Marked severity aortic atherosclerosis. No enlarged abdominal or pelvic lymph nodes. Reproductive: The prostate gland is moderately enlarged. Other: No abdominal wall hernia or abnormality. No abdominopelvic ascites. Musculoskeletal: Bilateral pedicle screws are seen within the mid and lower lumbar spine. There is associated streak artifact and subsequently limited evaluation of the adjacent osseous and soft tissue structures. This represents a new finding when compared to the prior study. IMPRESSION: 1. Large amount of stool throughout the ascending, transverse  and descending colon. 2. Marked severity aortic atherosclerosis. 3. Postoperative changes within the mid and lower lumbar spine. Aortic Atherosclerosis (ICD10-I70.0). Electronically Signed   By: Virgina Norfolk M.D.   On: 05/15/2019 20:49   DG Chest Portable 1 View  Result Date: 05/15/2019 CLINICAL DATA:  Belching.  Nausea, vomiting. EXAM: PORTABLE CHEST 1 VIEW COMPARISON:  04/24/2019 FINDINGS: Heart and mediastinal contours are within normal limits. No focal opacities or effusions. No acute bony abnormality. IMPRESSION: No active cardiopulmonary disease. Electronically Signed   By: Rolm Baptise M.D.   On: 05/15/2019 22:46   DG  Chest Port 1 View  Result Date: 04/24/2019 CLINICAL DATA:  Chest pain.  Palpitations. EXAM: PORTABLE CHEST 1 VIEW COMPARISON:  12/20/2014. FINDINGS: Mediastinum and hilar structures normal. Heart size normal. Lungs are clear. No pleural effusion or pneumothorax. Prior cervical spine fusion. Degenerative change thoracic spine. IMPRESSION: No acute cardiopulmonary disease. Electronically Signed   By: Marcello Moores  Register   On: 04/24/2019 15:43   ECHOCARDIOGRAM COMPLETE  Result Date: 04/25/2019   ECHOCARDIOGRAM REPORT   Patient Name:   BASILIOS BRABEC Date of Exam: 04/25/2019 Medical Rec #:  [id number]         Height:       75.0 in Accession #:    [id number]        Weight:       205.7 lb Date of Birth:  03-17-1946         BSA:          2.22 m Patient Age:    89 years          BP:           139/94 mmHg Patient Gender: M                 HR:           57 bpm. Exam Location:  Inpatient Procedure: 2D Echo Indications:     Atrial Fibrillation I48.91  History:         Patient has no prior history of Echocardiogram examinations.                  Signs/Symptoms:Murmur; Risk Factors:Hypertension.  Sonographer:     Mikki Santee RDCS (AE) Referring Phys:  [id number] Roney Jaffe Diagnosing Phys: Adrian Prows MD IMPRESSIONS  1. Left ventricular ejection fraction, by visual estimation, is 60 to 65%. The left ventricle has normal function. There is no left ventricular hypertrophy.  2. The left ventricle has no regional wall motion abnormalities.  3. Global right ventricle has normal systolic function.The right ventricular size is normal. No increase in right ventricular wall thickness.  4. Left atrial size was normal.  5. Right atrial size was normal.  6. The mitral valve is normal in structure. No evidence of mitral valve regurgitation. No evidence of mitral stenosis.  7. The tricuspid valve is normal in structure.  8. The aortic valve is normal in structure. Aortic valve regurgitation is not visualized. No evidence of aortic valve  sclerosis or stenosis.  9. The pulmonic valve was normal in structure. Pulmonic valve regurgitation is not visualized. 10. The inferior vena cava is normal in size with greater than 50% respiratory variability, suggesting right atrial pressure of 3 mmHg. FINDINGS  Left Ventricle: Left ventricular ejection fraction, by visual estimation, is 60 to 65%. The left ventricle has normal function. The left ventricle has no regional wall motion abnormalities. There is no left ventricular hypertrophy. Normal left atrial pressure. Right  Ventricle: The right ventricular size is normal. No increase in right ventricular wall thickness. Global RV systolic function is has normal systolic function. Left Atrium: Left atrial size was normal in size. Right Atrium: Right atrial size was normal in size Pericardium: There is no evidence of pericardial effusion. Mitral Valve: The mitral valve is normal in structure. No evidence of mitral valve regurgitation. No evidence of mitral valve stenosis by observation. Tricuspid Valve: The tricuspid valve is normal in structure. Tricuspid valve regurgitation is not demonstrated. Aortic Valve: The aortic valve is normal in structure. Aortic valve regurgitation is not visualized. The aortic valve is structurally normal, with no evidence of sclerosis or stenosis. Pulmonic Valve: The pulmonic valve was normal in structure. Pulmonic valve regurgitation is not visualized. Pulmonic regurgitation is not visualized. Aorta: The aortic root, ascending aorta and aortic arch are all structurally normal, with no evidence of dilitation or obstruction. Venous: The inferior vena cava is normal in size with greater than 50% respiratory variability, suggesting right atrial pressure of 3 mmHg. IAS/Shunts: No atrial level shunt detected by color flow Doppler.  LEFT VENTRICLE PLAX 2D LVIDd:         4.40 cm  Diastology LVIDs:         2.90 cm  LV e' lateral:   9.46 cm/s LV PW:         1.00 cm  LV E/e' lateral: 9.3 LV IVS:         1.00 cm  LV e' medial:    7.18 cm/s LVOT diam:     2.30 cm  LV E/e' medial:  12.3 LV SV:         55 ml LV SV Index:   24.89 LVOT Area:     4.15 cm  RIGHT VENTRICLE RV S prime:     11.40 cm/s TAPSE (M-mode): 2.0 cm LEFT ATRIUM             Index       RIGHT ATRIUM           Index LA diam:        2.70 cm 1.22 cm/m  RA Area:     15.70 cm LA Vol (A2C):   38.8 ml 17.47 ml/m RA Volume:   36.70 ml  16.53 ml/m LA Vol (A4C):   36.7 ml 16.53 ml/m LA Biplane Vol: 41.5 ml 18.69 ml/m  AORTIC VALVE LVOT Vmax:   105.00 cm/s LVOT Vmean:  66.400 cm/s LVOT VTI:    0.212 m  AORTA Ao Root diam: 2.80 cm MITRAL VALVE MV Area (PHT): 3.60 cm             SHUNTS MV PHT:        61.19 msec           Systemic VTI:  0.21 m MV Decel Time: 211 msec             Systemic Diam: 2.30 cm MV E velocity: 88.30 cm/s 103 cm/s MV A velocity: 70.30 cm/s 70.3 cm/s MV E/A ratio:  1.26       1.5  Adrian Prows MD Electronically signed by Adrian Prows MD Signature Date/Time: 04/25/2019/2:58:46 PM    Final    (Echo, Carotid, EGD, Colonoscopy, ERCP)    Subjective: Awake alert resting in bed anxious to go home  Discharge Exam: Vitals:   05/17/19 0522 05/17/19 0903  BP: 131/79 132/80  Pulse: 84 92  Resp: 18 18  Temp: 98.2 F (36.8 C) 98.2 F (36.8 C)  SpO2: 97% 94%   Vitals:   05/16/19 2027 05/16/19 2205 05/17/19 0522 05/17/19 0903  BP: (!) 109/57 129/73 131/79 132/80  Pulse: 88 81 84 92  Resp: 18  18 18   Temp: 98.2 F (36.8 C)  98.2 F (36.8 C) 98.2 F (36.8 C)  TempSrc: Oral  Oral Oral  SpO2: 93%  97% 94%  Weight: 93.6 kg     Height:        General: Pt is alert, awake, not in acute distress Cardiovascular: RRR, S1/S2 +, no rubs, no gallops Respiratory: CTA bilaterally, no wheezing, no rhonchi Abdominal: Soft, NT, ND, bowel sounds + Extremities: no edema, no cyanosis    The results of significant diagnostics from this hospitalization (including imaging, microbiology, ancillary and laboratory) are listed below for  reference.     Microbiology: Recent Results (from the past 240 hour(s))  SARS CORONAVIRUS 2 (TAT 6-24 HRS) Nasopharyngeal Nasopharyngeal Swab     Status: None   Collection Time: 05/16/19  2:39 AM   Specimen: Nasopharyngeal Swab  Result Value Ref Range Status   SARS Coronavirus 2 NEGATIVE NEGATIVE Final    Comment: (NOTE) SARS-CoV-2 target nucleic acids are NOT DETECTED. The SARS-CoV-2 RNA is generally detectable in upper and lower respiratory specimens during the acute phase of infection. Negative results do not preclude SARS-CoV-2 infection, do not rule out co-infections with other pathogens, and should not be used as the sole basis for treatment or other patient management decisions. Negative results must be combined with clinical observations, patient history, and epidemiological information. The expected result is Negative. Fact Sheet for Patients: SugarRoll.be Fact Sheet for Healthcare Providers: https://www.woods-.com/ This test is not yet approved or cleared by the [place] FDA and  has been authorized for detection and/or diagnosis of SARS-CoV-2 by FDA under an Emergency Use Authorization (EUA). This EUA will remain  in effect (meaning this test can be used) for the duration of the COVID-19 declaration under Section 56 4(b)(1) of the Act, 21 U.S.C. section 360bbb-3(b)(1), unless the authorization is terminated or revoked sooner. Performed at [facility], [place] [address], [place], [place] [address]   C difficile quick scan w PCR reflex     Status: None   Collection Time: 05/16/19  8:12 AM   Specimen: STOOL  Result Value Ref Range Status   C Diff antigen NEGATIVE NEGATIVE Final   C Diff toxin NEGATIVE NEGATIVE Final   C Diff interpretation No C. difficile detected.  Final    Comment: Performed at [facility], [place] [address], [place], [place] [address]     Labs: BNP (last 3 results) No results for  input(s): BNP in the last 8760 hours. Basic Metabolic Panel: Recent Labs  Lab 05/15/19 1803 05/15/19 1944 05/16/19 0706 05/16/19 1328 05/16/19 1708 05/17/19 0022 05/17/19 0615  NA  --    < > 120* 121* 123* 127* 129*  K  --    < > 4.0 3.8 4.1 3.9 3.9  CL  --    < > 88* 90* 92* 95* 97*  CO2  --    < > 22 22 22 24 23   GLUCOSE  --    < > 87 104* 101* 103* 103*  BUN  --    < > 7* 9 9 10 8   CREATININE  --    < > 0.95 1.10 1.15 1.05 0.97  CALCIUM  --    < > 8.5* 8.3* 8.3* 8.3* 8.5*  MG 1.4*  --   --   --   --   --   --  PHOS 2.5  --   --   --   --   --   --    < > = values in this interval not displayed.   Liver Function Tests: Recent Labs  Lab 05/15/19 1013  AST 19  ALT 15  ALKPHOS 77  BILITOT 0.9  PROT 6.4  ALBUMIN 4.2   Recent Labs  Lab 05/15/19 1013  LIPASE 20.0   No results for input(s): AMMONIA in the last 168 hours. CBC: Recent Labs  Lab 05/15/19 1013 05/16/19 0210 05/17/19 0615  WBC 11.7* 11.8* 7.0  NEUTROABS 8.9*  --   --   HGB 13.0 13.0 13.3  HCT 36.8* 34.7* 36.4*  MCV 86.1 82.4 84.7  PLT 271.0 215 197   Cardiac Enzymes: No results for input(s): CKTOTAL, CKMB, CKMBINDEX, TROPONINI in the last 168 hours. BNP: Invalid input(s): POCBNP CBG: No results for input(s): GLUCAP in the last 168 hours. D-Dimer No results for input(s): DDIMER in the last 72 hours. Hgb A1c No results for input(s): HGBA1C in the last 72 hours. Lipid Profile No results for input(s): CHOL, HDL, LDLCALC, TRIG, CHOLHDL, LDLDIRECT in the last 72 hours. Thyroid function studies No results for input(s): TSH, T4TOTAL, T3FREE, THYROIDAB in the last 72 hours.  Invalid input(s): FREET3 Anemia work up No results for input(s): VITAMINB12, FOLATE, FERRITIN, TIBC, IRON, RETICCTPCT in the last 72 hours. Urinalysis    Component Value Date/Time   COLORURINE YELLOW 05/15/2019 1803   APPEARANCEUR CLOUDY (A) 05/15/2019 1803   LABSPEC 1.014 05/15/2019 1803   PHURINE 8.0 05/15/2019 1803    GLUCOSEU NEGATIVE 05/15/2019 1803   GLUCOSEU NEGATIVE 05/02/2019 1054   HGBUR NEGATIVE 05/15/2019 1803   BILIRUBINUR NEGATIVE 05/15/2019 1803   BILIRUBINUR Neg 04/14/2016 1032   KETONESUR 20 (A) 05/15/2019 1803   PROTEINUR 30 (A) 05/15/2019 1803   UROBILINOGEN 0.2 05/02/2019 1054   NITRITE NEGATIVE 05/15/2019 1803   LEUKOCYTESUR NEGATIVE 05/15/2019 1803   Sepsis Labs Invalid input(s): PROCALCITONIN,  WBC,  LACTICIDVEN Microbiology Recent Results (from the past 240 hour(s))  SARS CORONAVIRUS 2 (TAT 6-24 HRS) Nasopharyngeal Nasopharyngeal Swab     Status: None   Collection Time: 05/16/19  2:39 AM   Specimen: Nasopharyngeal Swab  Result Value Ref Range Status   SARS Coronavirus 2 NEGATIVE NEGATIVE Final    Comment: (NOTE) SARS-CoV-2 target nucleic acids are NOT DETECTED. The SARS-CoV-2 RNA is generally detectable in upper and lower respiratory specimens during the acute phase of infection. Negative results do not preclude SARS-CoV-2 infection, do not rule out co-infections with other pathogens, and should not be used as the sole basis for treatment or other patient management decisions. Negative results must be combined with clinical observations, patient history, and epidemiological information. The expected result is Negative. Fact Sheet for Patients: SugarRoll.be Fact Sheet for Healthcare Providers: https://www.woods-.com/ This test is not yet approved or cleared by the [place] FDA and  has been authorized for detection and/or diagnosis of SARS-CoV-2 by FDA under an Emergency Use Authorization (EUA). This EUA will remain  in effect (meaning this test can be used) for the duration of the COVID-19 declaration under Section 56 4(b)(1) of the Act, 21 U.S.C. section 360bbb-3(b)(1), unless the authorization is terminated or revoked sooner. Performed at [facility], [place] [address], [place], [place] [address]   C  difficile quick scan w PCR reflex     Status: None   Collection Time: 05/16/19  8:12 AM   Specimen: STOOL  Result Value  Ref Range Status   C Diff antigen NEGATIVE NEGATIVE Final   C Diff toxin NEGATIVE NEGATIVE Final   C Diff interpretation No C. difficile detected.  Final    Comment: Performed at [facility], [place] [address] [place], [place] [address]     Time coordinating discharge:  39 minutes  SIGNED:   Georgette Shell, MD  [facility] 05/17/2019, 11:31 AM Pager   If 7PM-7AM, please contact night-coverage [email or web address] Password [email or web address]

## 2019-05-17 NOTE — Evaluation (Signed)
Physical Therapy Evaluation Patient Details Name: [name] MRN: [id number] DOB: 10/27/1945 Today's Date: 05/17/2019   History of Present Illness  Pt is a 74 y.o. M with significant PMH of atrial fibrillation, HTN, Parkinson's who presents with hyponatremia.   Clinical Impression  Patient evaluated by Physical Therapy with no further acute PT needs identified. Pt reports mild weakness but denying headache, lethargy, confusion. Pt ambulating 200 feet at a supervision level, holding onto IV pole for additional support. Discussed potentially using walker temporarily; also provided education on activity recommendations and progression. All education has been completed and the patient has no further questions. No follow-up Physical Therapy or equipment needs. PT is signing off. Thank you for this referral.     Follow Up Recommendations No PT follow up    Equipment Recommendations  None recommended by PT    Recommendations for Other Services       Precautions / Restrictions Precautions Precautions: Fall Restrictions Weight Bearing Restrictions: No      Mobility  Bed Mobility Overal bed mobility: Independent                Transfers Overall transfer level: Independent Equipment used: None                Ambulation/Gait Ambulation/Gait assistance: Supervision Gait Distance (Feet): 200 Feet Assistive device: IV Pole Gait Pattern/deviations: Step-through pattern Gait velocity: decreased   General Gait Details: Single UE use of IV pole, slow and steady pace. Supervision for safety  Stairs            Wheelchair Mobility    Modified Rankin (Stroke Patients Only)       Balance Overall balance assessment: Mild deficits observed, not formally tested                                           Pertinent Vitals/Pain Pain Assessment: No/denies pain    Home Living Family/patient expects to be discharged to:: Private  residence Living Arrangements: Spouse/significant other Available Help at Discharge: Family;Available 24 hours/day Type of Home: House Home Access: Stairs to enter   [employer] of Steps: 1 Home Layout: One level Home Equipment: Walker - 4 wheels;Shower seat - built Investment banker, operational      Prior Function Level of Independence: Independent               Journalist, newspaper        Extremity/Trunk Assessment   Upper Extremity Assessment Upper Extremity Assessment: Overall WFL for tasks assessed    Lower Extremity Assessment Lower Extremity Assessment: Overall WFL for tasks assessed       Communication   Communication: No difficulties  Cognition Arousal/Alertness: Awake/alert Behavior During Therapy: WFL for tasks assessed/performed Overall Cognitive Status: Within Functional Limits for tasks assessed                                        General Comments      Exercises     Assessment/Plan    PT Assessment Patent does not need any further PT services  PT Problem List         PT Treatment Interventions      PT Goals (Current goals can be found in the Care Plan section)  Acute Rehab PT Goals Patient Stated Goal: "  get back to normal." PT Goal Formulation: All assessment and education complete, DC therapy    Frequency     Barriers to discharge        Co-evaluation               AM-PAC PT "6 Clicks" Mobility  Outcome Measure Help needed turning from your back to your side while in a flat bed without using bedrails?: None Help needed moving from lying on your back to sitting on the side of a flat bed without using bedrails?: None Help needed moving to and from a bed to a chair (including a wheelchair)?: None Help needed standing up from a chair using your arms (e.g., wheelchair or bedside chair)?: None Help needed to walk in hospital room?: None Help needed climbing 3-5 steps with a railing? : A Little 6 Click Score:  23    End of Session Equipment Utilized During Treatment: Gait belt Activity Tolerance: Patient tolerated treatment well Patient left: in bed;with call bell/phone within reach;with bed alarm set;with family/visitor present   PT Visit Diagnosis: Unsteadiness on feet (R26.81);Difficulty in walking, not elsewhere classified (R26.2)    Time: [id number] PT Time Calculation (min) (ACUTE ONLY): 19 min   Charges:   PT Evaluation $PT Eval Moderate Complexity: 1 Mod            Wyona Almas, PT, DPT Acute Rehabilitation Services Pager [phone] Office [phone]   [name] 05/17/2019, 12:39 PM

## 2019-05-18 ENCOUNTER — Encounter: Payer: Self-pay | Admitting: Family Medicine

## 2019-05-18 DIAGNOSIS — R110 Nausea: Secondary | ICD-10-CM | POA: Insufficient documentation

## 2019-05-18 NOTE — Assessment & Plan Note (Signed)
Unclear source.  He does not feel well but at this point he looks like he was still appropriate for outpatient follow-up.  We check routine labs and abdominal films at the office visit.  When I found out that his sodium was as low as it was we advised him to go to the emergency room and he was admitted for hyponatremia.  See inpatient notes. At least 30 minutes were devoted to patient care in this encounter (this can potentially include time spent reviewing the patient's file/history, interviewing and examining the patient, counseling/reviewing plan with patient, ordering referrals, ordering tests, reviewing relevant laboratory or x-ray data, and documenting the encounter).

## 2019-05-19 ENCOUNTER — Other Ambulatory Visit: Payer: Self-pay

## 2019-05-19 ENCOUNTER — Telehealth: Payer: Self-pay

## 2019-05-19 ENCOUNTER — Encounter: Payer: Self-pay | Admitting: Family Medicine

## 2019-05-19 ENCOUNTER — Ambulatory Visit (INDEPENDENT_AMBULATORY_CARE_PROVIDER_SITE_OTHER): Payer: PPO | Admitting: Family Medicine

## 2019-05-19 ENCOUNTER — Ambulatory Visit: Payer: PPO | Admitting: Family Medicine

## 2019-05-19 VITALS — BP 118/80 | HR 115 | Temp 97.3°F | Resp 18 | Ht 75.0 in | Wt 208.0 lb

## 2019-05-19 DIAGNOSIS — R350 Frequency of micturition: Secondary | ICD-10-CM | POA: Diagnosis not present

## 2019-05-19 DIAGNOSIS — N3001 Acute cystitis with hematuria: Secondary | ICD-10-CM | POA: Diagnosis not present

## 2019-05-19 LAB — POCT URINALYSIS DIPSTICK
Bilirubin, UA: POSITIVE
Blood, UA: POSITIVE
Glucose, UA: POSITIVE — AB
Ketones, UA: POSITIVE
Nitrite, UA: POSITIVE
Protein, UA: POSITIVE — AB
Spec Grav, UA: 1.015 (ref 1.010–1.025)
Urobilinogen, UA: 4 E.U./dL — AB
pH, UA: 7 (ref 5.0–8.0)

## 2019-05-19 MED ORDER — SULFAMETHOXAZOLE-TRIMETHOPRIM 800-160 MG PO TABS: 1.0000 | ORAL_TABLET | Freq: Two times a day (BID) | ORAL | 0 refills | Status: DC

## 2019-05-19 NOTE — Addendum Note (Signed)
Addended by: Kris Mouton on: 05/19/2019 02:50 PM   Modules accepted: Orders

## 2019-05-19 NOTE — Progress Notes (Signed)
Subjective:     ANGLE HEROD is a 74 y.o. male presenting for Urinary Urgency (started on 05/18/19. back pain, burning with urination, frequency and urgency. started doxy today.)     Dysuria  This is a new problem. The current episode started yesterday. The problem occurs every urination. The problem has been gradually worsening. The quality of the pain is described as burning. There has been no fever. Associated symptoms include flank pain, frequency, hesitancy and urgency. Pertinent negatives include no chills, hematuria, nausea or vomiting. He has tried antibiotics for the symptoms. His past medical history is significant for catheterization (condom cath).       Review of Systems  Constitutional: Negative for chills.  Gastrointestinal: Negative for nausea and vomiting.  Genitourinary: Positive for dysuria, flank pain, frequency, hesitancy and urgency. Negative for hematuria.     Social History   Tobacco Use  Smoking Status Never Smoker  Smokeless Tobacco Never Used        Objective:    BP Readings from Last 3 Encounters:  05/19/19 118/80  05/17/19 132/80  05/15/19 (!) 144/80   Wt Readings from Last 3 Encounters:  05/19/19 208 lb (94.3 kg)  05/16/19 206 lb 6.4 oz (93.6 kg)  05/15/19 214 lb 4 oz (97.2 kg)    BP 118/80   Pulse (!) 115   Temp (!) 97.3 F (36.3 C)   Resp 18   Ht 6\' 3"  (1.905 m)   Wt 208 lb (94.3 kg)   SpO2 95%   BMI 26.00 kg/m    Physical Exam Constitutional:      Appearance: Normal appearance. He is not ill-appearing or diaphoretic.  HENT:     Right Ear: External ear normal.     Left Ear: External ear normal.  Eyes:     General: No scleral icterus.    Extraocular Movements: Extraocular movements intact.     Conjunctiva/sclera: Conjunctivae normal.  Cardiovascular:     Rate and Rhythm: Normal rate and regular rhythm.  Pulmonary:     Effort: Pulmonary effort is normal. No respiratory distress.     Breath sounds: Normal breath  sounds. No wheezing.  Abdominal:     General: Bowel sounds are normal. There is no distension.     Tenderness: There is no abdominal tenderness. There is no right CVA tenderness, left CVA tenderness or guarding.  Musculoskeletal:     Cervical back: Neck supple.     Comments: Difficulty laying flat due to low back pain  Skin:    General: Skin is warm and dry.  Neurological:     Mental Status: He is alert. Mental status is at baseline.  Psychiatric:        Mood and Affect: Mood normal.     UA: (on azo): blood, LE, nitrites, glucose and ketones      Assessment & Plan:   Problem List Items Addressed This Visit    None    Visit Diagnoses    Urinary frequency    -  Primary   Relevant Medications   sulfamethoxazole-trimethoprim (BACTRIM DS) 800-160 MG tablet   Other Relevant Orders   POCT urinalysis dipstick (Completed)   Acute cystitis with hematuria       Relevant Medications   sulfamethoxazole-trimethoprim (BACTRIM DS) 800-160 MG tablet     UA consistent with UTI. Discussed if worsening symptoms or not improving may need longer course for possible prostatitis however, symptoms seem more consistent with UTI w/o fever today.    Return  in about 1 week (around 05/26/2019).  Lesleigh Noe, MD

## 2019-05-19 NOTE — Patient Instructions (Signed)
#  Urine infection - continue to take Azo as needed - can also do Cranberry - Start antibiotic  - if symptoms do not improve or worsen let us know - if fever, abdominal pain, worsening symptoms call back

## 2019-05-19 NOTE — Telephone Encounter (Signed)
Thanks

## 2019-05-19 NOTE — Telephone Encounter (Signed)
Transition Care Management Follow-up Telephone Call  Date of discharge and from where: 05/17/2019 from [facility]   How have you been since you were released from the hospital? "okay, just dealing with UTI and was seen at office today for that "  Any questions or concerns? No   Items Reviewed:  Did the pt receive and understand the discharge instructions provided? Yes   Medications obtained and verified? Yes   Any new allergies since your discharge? No   Dietary orders reviewed? Yes  Do you have support at home? Yes   Functional Questionnaire: (I = Independent and D = Dependent) ADLs: i  Bathing/Dressing- i  Meal Prep- i  Eating- i  Maintaining continence- i  Transferring/Ambulation- i  Managing Meds- i  Follow up appointments reviewed:   PCP Hospital f/u appt confirmed? Yes  Scheduled to see Dr.Duncan on 05/26/2019 @ 8:45am  Specialist Hospital f/u appt confirmed? no  Are transportation arrangements needed? No   If their condition worsens, is the pt aware to call PCP or go to the Emergency Dept.? Yes  Was the patient provided with contact information for the PCP's office or ED? Yes  Was to pt encouraged to call back with questions or concerns? Yes

## 2019-05-19 NOTE — Telephone Encounter (Signed)
HFU scheduled 05/26/19 @ 8:45 AM.

## 2019-05-20 ENCOUNTER — Telehealth: Payer: Self-pay | Admitting: Neurology

## 2019-05-20 NOTE — Telephone Encounter (Signed)
Patient called and left a message requesting a call back about restarting a medication tomorrow.   I returned his call to confirm the name of the medication: pramipexole .5 MG. He was to stop it and restart it at after getting out of the hospital.

## 2019-05-20 NOTE — Telephone Encounter (Signed)
Patient just wanted to verify his titration of 1 tablet three times per day for a week, then 2 po three times per day for a week

## 2019-05-21 ENCOUNTER — Encounter: Payer: Self-pay | Admitting: Family Medicine

## 2019-05-21 LAB — URINE CULTURE
MICRO NUMBER:: 10102008
SPECIMEN QUALITY:: ADEQUATE

## 2019-05-22 NOTE — Telephone Encounter (Signed)
Patient advised and verbalized understanding 

## 2019-05-22 NOTE — Telephone Encounter (Signed)
Patient called.  I let him know Dr.Cody's comments.  Patient said he woke up this morning with no appetite and patient has diarrhea and stomach pain.  Patient said when his stomach gets upset he gets a cold sore on his lip.  He noticed the cold sore last night and he'll get Beriva for the cold sore.  Patient said the burning has stopped.  Should patient continue medication? Patient uses [facility].

## 2019-05-22 NOTE — Telephone Encounter (Signed)
As burning has improved, would complete the antibiotic.   Diarrhea may be due the antibiotic - could take a probiotic or eat yogurt. If not improving recommend follow-up appointment to discuss with Dr. Damita Dunnings.   Lesleigh Noe

## 2019-05-23 MED ORDER — PROPRANOLOL HCL 20 MG PO TABS: 20.0000 mg | ORAL_TABLET | Freq: Three times a day (TID) | ORAL | 1 refills | Status: DC

## 2019-05-23 NOTE — Addendum Note (Signed)
Addended by: Kela Millin on: 05/23/2019 06:23 PM   Modules accepted: Orders

## 2019-05-23 NOTE — Telephone Encounter (Signed)
Called and spoke with patient, he said he was on Propanolol 120mg  QD made him very sick and you took him off it, so I explained that yo mentioned Propanolol 20 TID, and he said declined for now because he is still on antibiotics and does not want to take anything else for now, but asked for [place] to send it in, because he will try to start it (Propanolol 20mg  TID) if he changes his mind. He said he wants to get his stomach pains feeling better first.

## 2019-05-23 NOTE — Telephone Encounter (Signed)
ICD-10-CM   1. Paroxysmal atrial fibrillation (HCC)  I48.0 propranolol (INDERAL) 20 MG tablet  2. Essential hypertension  I10 propranolol (INDERAL) 20 MG tablet

## 2019-05-26 ENCOUNTER — Encounter: Payer: Self-pay | Admitting: Family Medicine

## 2019-05-26 ENCOUNTER — Ambulatory Visit (INDEPENDENT_AMBULATORY_CARE_PROVIDER_SITE_OTHER): Payer: PPO | Admitting: Family Medicine

## 2019-05-26 ENCOUNTER — Other Ambulatory Visit: Payer: Self-pay

## 2019-05-26 VITALS — BP 142/78 | HR 103 | Temp 96.7°F | Ht 75.0 in | Wt 206.3 lb

## 2019-05-26 DIAGNOSIS — E871 Hypo-osmolality and hyponatremia: Secondary | ICD-10-CM | POA: Diagnosis not present

## 2019-05-26 LAB — CBC WITH DIFFERENTIAL/PLATELET
Basophils Absolute: 0.1 10*3/uL (ref 0.0–0.1)
Basophils Relative: 1.1 % (ref 0.0–3.0)
Eosinophils Absolute: 0.1 10*3/uL (ref 0.0–0.7)
Eosinophils Relative: 0.7 % (ref 0.0–5.0)
HCT: 39.1 % (ref 39.0–52.0)
Hemoglobin: 13.3 g/dL (ref 13.0–17.0)
Lymphocytes Relative: 15.4 % (ref 12.0–46.0)
Lymphs Abs: 1.4 10*3/uL (ref 0.7–4.0)
MCHC: 34.1 g/dL (ref 30.0–36.0)
MCV: 89.8 fl (ref 78.0–100.0)
Monocytes Absolute: 0.9 10*3/uL (ref 0.1–1.0)
Monocytes Relative: 9.7 % (ref 3.0–12.0)
Neutro Abs: 6.5 10*3/uL (ref 1.4–7.7)
Neutrophils Relative %: 73.1 % (ref 43.0–77.0)
Platelets: 335 10*3/uL (ref 150.0–400.0)
RBC: 4.35 Mil/uL (ref 4.22–5.81)
RDW: 12.9 % (ref 11.5–15.5)
WBC: 8.9 10*3/uL (ref 4.0–10.5)

## 2019-05-26 LAB — BASIC METABOLIC PANEL
BUN: 11 mg/dL (ref 6–23)
CO2: 25 mEq/L (ref 19–32)
Calcium: 9.6 mg/dL (ref 8.4–10.5)
Chloride: 95 mEq/L — ABNORMAL LOW (ref 96–112)
Creatinine, Ser: 1.37 mg/dL (ref 0.40–1.50)
GFR: 50.94 mL/min — ABNORMAL LOW (ref >60.00)
Glucose, Bld: 91 mg/dL (ref 70–99)
Potassium: 4.7 mEq/L (ref 3.5–5.1)
Sodium: 129 mEq/L — ABNORMAL LOW (ref 135–145)

## 2019-05-26 MED ORDER — LOSARTAN POTASSIUM 50 MG PO TABS: 50.0000 mg | ORAL_TABLET | Freq: Every day | ORAL | 3 refills | Status: DC

## 2019-05-26 NOTE — Patient Instructions (Signed)
Don't change your meds for now.  Go to the lab on the way out.   If you have mychart we'll likely use that to update you.    We'll go from there.  Take care.  Glad to see you.

## 2019-05-26 NOTE — Progress Notes (Signed)
This visit occurred during the SARS-CoV-2 public health emergency.  Safety protocols were in place, including screening questions prior to the visit, additional usage of staff PPE, and extensive cleaning of exam room while observing appropriate contact time as indicated for disinfecting solutions.  =============================== Admit date: 05/15/2019  Discharge date: 05/17/2019  Admitted From: Home  Disposition: Home  Recommendations for Outpatient Follow-up:  1. Follow up with PCP in 1-2 weeks 2. Please obtain BMP/CBC 2/2 Home Health: None  Equipment/Devices: None  Discharge Condition: Stable and improved  CODE STATUS full code  Diet recommendation: Cardiac diet  Brief/Interim Summary:74 year old male with a past medical history significant for GERD, atrial fibrillation, hypertension, Parkinson's disease, obstructive sleep apnea on CPAP who was sent in by his PCP after all lab draw showed a sodium of 112. Over the past week, he has not been eating or drinking as he normally does. He has been having abdominal pain ever since surgery, he has had issues with constipation. Imaging in the ER showed stool buildup.  Discharge Diagnoses:  Principal Problem:  Hyponatremia  Active Problems:  GERD (gastroesophageal reflux disease)  HTN (hypertension)  OSA on CPAP  Lower back pain  Neural foraminal stenosis of lumbosacral spine  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HCC)  Parkinson disease (HCC)  Atrial fibrillation, chronic (HCC)   #1 hypovolemic hyponatremia secondary to nutritional deficiency and hydrochlorothiazide. Patient's appetite has been less and has been taking less and less p.o. intake a week prior to admission to hospital. He was treated with IV normal saline his sodium improved from 112 at the time of admission to 129 at the time of discharge. He was asked to stop taking hydrochlorothiazide.  Seen by physical therapy did not recommend home PT.  #2  history of essential hypertension continue losartan and Cardizem.  #3 obstructive sleep apnea on CPAP continue.  #4 Parkinson's disease not on any medications other than Mirapex which the patient stopped taking due to constipation side effects.  #5 atrial fibrillation chronic continue Xarelto and Cardizem.  #6 constipation advised patient to take senna, MiraLAX vitamin C magnesium etc.  Estimated body mass index is 25.8 kg/m as calculated from the following:  Height as of this encounter: 6\' 3"  (1.905 m).  Weight as of this encounter: 93.6 kg.  Discharge Instructions        Allergies as of 05/17/2019      Reactions   Flecainide    Vomit    Flexeril [cyclobenzaprine]    Constipation   Morphine And Related Hives   Hallucinations- can tolerate oxycodone.    Promethazine    Would avoid due to Parkinsons   Sinemet [carbidopa-levodopa]    intolerant      Medication List     STOP taking these medications    hydrochlorothiazide 12.5 MG capsule  Commonly known as: MICROZIDE   pramipexole 0.5 MG tablet  Commonly known as: MIRAPEX    TAKE these medications    BIOFREEZE EX  Apply 1 application topically daily as needed (pain).   bismuth subsalicylate [id number] suspension  Commonly known as: PEPTO BISMOL  Take 30 mLs by mouth every 6 (six) hours as needed.   diazepam 5 MG tablet  Commonly known as: VALIUM  Take 0.5-1 tablets (2.5-5 mg total) by mouth every 12 (twelve) hours as needed (for nausea or sleep. don't take with Lorrin Mais).   diltiazem 180 MG 24 hr capsule  Commonly known as: CARDIZEM CD  Take 1 capsule (180 mg total)  by mouth daily.   losartan 50 MG tablet  Commonly known as: COZAAR  Take 1 tablet (50 mg total) by mouth daily.   omeprazole 20 MG capsule  Commonly known as: PRILOSEC  Take 20 mg by mouth daily as needed (heart burn).   ondansetron 4 MG tablet  Commonly known as: ZOFRAN  Take 1 tablet (4 mg total) by mouth every 6 (six) hours as needed for nausea or  vomiting.   psyllium 0.52 g capsule  Commonly known as: REGULOID  Take 0.52 g by mouth daily.   pyridOXINE 25 MG tablet  Commonly known as: VITAMIN B-6  Take 1 tablet (25 mg total) by mouth 3 (three) times daily as needed (nausea).   rivaroxaban 20 MG Tabs tablet  Commonly known as: XARELTO  Take 1 tablet (20 mg total) by mouth daily with supper.   senna 8.6 MG Tabs tablet  Commonly known as: SENOKOT  Take 1 tablet (8.6 mg total) by mouth at bedtime.   vitamin C 250 MG tablet  Commonly known as: ASCORBIC ACID  Take 2 tablets (500 mg total) by mouth daily.   zolpidem 5 MG tablet  Commonly known as: Ambien  Take 1 tablet (5 mg total) by mouth at bedtime as needed for sleep.    =============================== Inpatient course and low Na d/w pt, along with inpatient labs and w/u.  He is off HCTZ in the meantime. He is done with abx in the meantime.  No dysuria now.  No fevers.  No vomiting.  He feels better but not back to baseline.   Rare use of BZD in the meantime.    He has had to use occ extra dose of CCB in the meantime due to inc heart rate.  Cardiology may restart propranolol in the future, d/w pt.  I'll defer to cards.    He is back on mirapex in the meantime.    Meds, vitals, and allergies reviewed.   ROS: Per HPI unless specifically indicated in ROS section   nad ncat Neck supple, no LA rrr ctab abd soft, not ttp Ext w/o edema.  Tremor at baseline.

## 2019-05-29 NOTE — Assessment & Plan Note (Signed)
See notes on labs.  He didn't have sig mental status changes with lower levels of Na but he clearly didn't feel well.  He is still overall deconditioned from his prev baseline but improved from admission and discharge status.  Avoid HCTZ.  Will update cardiology about his situations. At this point, still okay for outpatient f/u.   At least 30 minutes were devoted to patient care in this encounter (this can potentially include time spent reviewing the patient's file/history, interviewing and examining the patient, counseling/reviewing plan with patient, ordering referrals, ordering tests, reviewing relevant laboratory or x-ray data, and documenting the encounter).

## 2019-05-29 NOTE — Progress Notes (Signed)
I have had many calls from him including at 2 am stating he woke up to go to the bathroom and his heart started pounding.  Problem is he has symptoms whether he takes any medications I Rx vs not. I do not think I can fix his problems will try to see what I can do.  I advised him that he should stop complaining about palpitations and not feeling well vs feeling bad when he does take cardiac meds!!!

## 2019-06-03 ENCOUNTER — Other Ambulatory Visit: Payer: Self-pay | Admitting: Neurology

## 2019-06-09 ENCOUNTER — Other Ambulatory Visit: Payer: Self-pay | Admitting: Neurology

## 2019-06-17 ENCOUNTER — Other Ambulatory Visit: Payer: Self-pay | Admitting: Cardiology

## 2019-06-17 DIAGNOSIS — I10 Essential (primary) hypertension: Secondary | ICD-10-CM

## 2019-06-17 DIAGNOSIS — I480 Paroxysmal atrial fibrillation: Secondary | ICD-10-CM

## 2019-06-19 ENCOUNTER — Encounter: Payer: Self-pay | Admitting: Family Medicine

## 2019-06-19 ENCOUNTER — Ambulatory Visit (INDEPENDENT_AMBULATORY_CARE_PROVIDER_SITE_OTHER): Payer: PPO | Admitting: Family Medicine

## 2019-06-19 ENCOUNTER — Other Ambulatory Visit: Payer: Self-pay

## 2019-06-19 DIAGNOSIS — R110 Nausea: Secondary | ICD-10-CM | POA: Diagnosis not present

## 2019-06-19 DIAGNOSIS — I480 Paroxysmal atrial fibrillation: Secondary | ICD-10-CM | POA: Diagnosis not present

## 2019-06-19 DIAGNOSIS — I10 Essential (primary) hypertension: Secondary | ICD-10-CM

## 2019-06-19 MED ORDER — PROPRANOLOL HCL 20 MG PO TABS: ORAL_TABLET | ORAL | Status: DC

## 2019-06-19 NOTE — Patient Instructions (Addendum)
1 change at a time.   Stop propranolol first.  See if you feel better.  If so, then update me.   If not better after a few days, then change diltiazem to nighttime.  Update me if either way a few days after that.   If not better then we need to see about potentially changing the pramipexole.    When you update me, let me know about your BP and pulse.  We'll go from there.   Take care.  Glad to see you.

## 2019-06-19 NOTE — Progress Notes (Signed)
This visit occurred during the SARS-CoV-2 public health emergency.  Safety protocols were in place, including screening questions prior to the visit, additional usage of staff PPE, and extensive cleaning of exam room while observing appropriate contact time as indicated for disinfecting solutions.  We talked about his meds in detail.  He is off losartan and his BP is reasonable, d/w pt.  He feels good in the AM but then has trouble after taking AM meds.  Nausea noted.  B6 helps some but it doesn't last all day.  He feels better than last OV but is still bothered by nausea.    He is taking xarelto at night.  He dont' think the B6 is causing nausea.    He is taking diltazem in the AM.  He is usually taking 1 propranolol in the AM, 20mg  a day.  Still on mirapex 3 times a day.    No FCNAVD.  He has some constipation.  No blood in stool.   Meds, vitals, and allergies reviewed.   ROS: Per HPI unless specifically indicated in ROS section   GEN: nad, alert and oriented HEENT: ncat NECK: supple w/o LA CV: rrr PULM: ctab, no inc wob ABD: soft, +bs EXT: no edema SKIN: no acute rash but left shin with old wound slowly healing.  No acute changes.  No spreading erythema or discharge.  He has dry skin noted on left foot.  No ulceration on the foot. Tremor noted, R hand

## 2019-06-20 ENCOUNTER — Encounter: Payer: Self-pay | Admitting: Cardiology

## 2019-06-23 NOTE — Assessment & Plan Note (Signed)
He thought this was med related as it only happens in the morning after he takes his a.m. meds.  We talked about making one change at a time.  Stop propranolol first.  He will see if he feels better.  If so, then he will update me.   If not better after a few days, then change diltiazem to nighttime.  He will update me either way a few days after that.   If not better then we need to see about potentially changing the pramipexole.    When he updates me I want him to let me know about his BP and pulse.  Rationale discussed with patient.  He agrees.

## 2019-06-26 ENCOUNTER — Telehealth: Payer: Self-pay

## 2019-06-26 DIAGNOSIS — I10 Essential (primary) hypertension: Secondary | ICD-10-CM

## 2019-06-26 DIAGNOSIS — I480 Paroxysmal atrial fibrillation: Secondary | ICD-10-CM

## 2019-06-26 NOTE — Telephone Encounter (Signed)
Pt left v/m that pt is taking propranolol one pill at night and that is working well. pts appetite is better and not sick like he was. If any changes pt will let Dr Damita Dunnings know. Pt had visit on 06/19/19 with instructions to stop Propranolol. I tried to call pt to verify pt is taking propranolol 20 mg and to get a BP and P reading. Left v/m requesting pt to cb [facility] with this info.

## 2019-06-26 NOTE — Telephone Encounter (Signed)
Pt called back and pt did stop the propranolol as instructed at 06/19/19 visit but pt said his BP started going up and pt restarted the propranolol; pt is taking propranolol 20 mg taking one tab at hs and pt is doing well. 06-25-19 at night BP was 134/84 and P 70.

## 2019-06-26 NOTE — Telephone Encounter (Signed)
Glad he is better.  Please verify med list/BP/pulse when he calls back.  Thanks.

## 2019-06-27 MED ORDER — PROPRANOLOL HCL 20 MG PO TABS: 20.0000 mg | ORAL_TABLET | Freq: Every day | ORAL | Status: DC

## 2019-06-27 NOTE — Telephone Encounter (Signed)
Patient advised.

## 2019-06-27 NOTE — Telephone Encounter (Signed)
Noted.  Thanks.  Would continue as is.  Med list updated.  Please update me as needed.

## 2019-07-01 ENCOUNTER — Other Ambulatory Visit: Payer: Self-pay | Admitting: Cardiology

## 2019-07-01 DIAGNOSIS — I480 Paroxysmal atrial fibrillation: Secondary | ICD-10-CM

## 2019-07-03 ENCOUNTER — Encounter: Payer: Self-pay | Admitting: Family Medicine

## 2019-07-03 ENCOUNTER — Ambulatory Visit (INDEPENDENT_AMBULATORY_CARE_PROVIDER_SITE_OTHER): Payer: PPO | Admitting: Family Medicine

## 2019-07-03 ENCOUNTER — Other Ambulatory Visit: Payer: Self-pay

## 2019-07-03 DIAGNOSIS — J301 Allergic rhinitis due to pollen: Secondary | ICD-10-CM | POA: Diagnosis not present

## 2019-07-03 DIAGNOSIS — J309 Allergic rhinitis, unspecified: Secondary | ICD-10-CM | POA: Insufficient documentation

## 2019-07-03 MED ORDER — BENZONATATE 200 MG PO CAPS: 200.0000 mg | ORAL_CAPSULE | Freq: Three times a day (TID) | ORAL | 1 refills | Status: DC | PRN

## 2019-07-03 NOTE — Assessment & Plan Note (Signed)
Seasonal pollen allergies causing itchy throat and rhinorrhea and cough   Adv to continue the cetrizine  Add back fluticasone nasal spray  Sent in tessalon (helped in the past)  Can also try delsym otc if really needed  inst to call if he develops any new symptoms (fever/ change in taste or smell) or if he does not improve  He plans to work outside with a mask-I agree that is wise as well

## 2019-07-03 NOTE — Patient Instructions (Signed)
Continue your cetrizine  Add fluticasone nasal spray 1-2 sprays in each nostril  Use a mask outside when in the pollen   I sent tessalon to the pharmacy to help with cough   Update if not starting to improve in a week or if worsening   Also if new symptoms (like headache, fever or loss of taste or smell)

## 2019-07-03 NOTE — Progress Notes (Signed)
Virtual Visit via Video Note  I connected with [name] on 07/03/19 at 11:30 AM EDT by a video enabled telemedicine application and verified that I am speaking with the correct person using two identifiers.  Location: Patient: home Provider: office   I discussed the limitations of evaluation and management by telemedicine and the availability of in person appointments. The patient expressed understanding and agreed to proceed.  Parties involved in encounter  Patient: [name]   Provider:  Loura Pardon MD    History of Present Illness: 74 yo pt of Dr Damita Dunnings presents with symptoms of allergy symptoms  and cough   Thinks he has allergy sniffles  Started 2 weeks ago  Better today due to rain   Given cough syrup a year ago   Tickles in the bottom of his throat  Only during the day  Dry (scant phlegm if clears throat)-clear in color -from pnd  Not a lot of sneezing - occasional  At times he feels itchy in the face /sinus area  No congestion  Runny nose in ams with clear sputum  Takes cetrizine for allergies  Just in season  Helps a little   Symptoms occur outdoors  Better with a mask  No wheezing or asthma   Was allergy tested in the past- also allergic to pet dander  Pollen  Worse in the spring   A little pressure before eyes (not bad) - may be from eye strain  Ears and throat are ok   No loss of taste or smell No fever  No GI symptoms  No exp to covid that he knows of   Uses ricola lozenges   bp 135/86 -stable   In the past flonase helps -has some at home but not taking now     Patient Active Problem List   Diagnosis Date Noted  . Allergic rhinitis 07/03/2019  . Nausea 05/18/2019  . Hyponatremia 05/15/2019  . Atrial fibrillation, chronic ([place]) 05/15/2019  . Parkinson disease ([place]) 05/04/2019  . 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 Atrial fibrillation with RVR ([place]) 04/24/2019  . History of recent  back surgery 04/24/2019  . 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 Cough 04/24/2018  . Skin lesion 10/31/2017  . Routine health maintenance 04/23/2017  . Lower urinary tract symptoms (LUTS) 04/17/2016  . Oral lesion 02/16/2016  . Medicare annual wellness visit, initial 12/01/2015  . Advance care planning 12/01/2015  . Lower back pain 12/01/2015  . OSA on CPAP   . HTN (hypertension) 04/23/2015  . Spondylolisthesis of lumbar region 12/16/2014  . GERD (gastroesophageal reflux disease) 10/11/2012  . RBBB 06/27/2011   Past Medical History:  Diagnosis Date  . Arthritis   . GERD (gastroesophageal reflux disease)   . H/O urinary frequency   . Heart murmur    as a child  . History of bronchitis   . HSV infection    oral  . Hypertension    improved after back pain treated with surgery  . Insomnia   . Joint pain   . OSA on CPAP    he ordered kit and started use 2017 w/o formal testing, see note from 11/30/15.   . Vertigo 10/13/2014   recurrent; trigger is cool air on left ear, improved with daily antihistamine   Past Surgical History:  Procedure Laterality Date  . ACHILLES TENDON REPAIR Left   . BACK SURGERY    . COLONOSCOPY  02/2011  Negative,[facility]  . DEBRIDEMENT AND CLOSURE WOUND Left 02/25/2018   Procedure: DEBRIDEMENT LEFT LEG APPLICATION INTEGRA;  Surgeon: Irene Limbo, MD;  Location: [facility];  Service: Plastics;  Laterality: Left;  . EYE SURGERY Bilateral    cataracts  . KNEE ARTHROSCOPY     L  . LUMBAR FUSION     2016  . LUMBAR SPINE SURGERY  03/31/2019   exploration of lumbar fusion w/L5-S1 decompression redo L4-5 fusion  . SPINE SURGERY  2003   cervical fusion  . TONSILLECTOMY    . varicoelectomy     Social History   Tobacco Use  . Smoking status: Never Smoker  . Smokeless tobacco: Never Used  Substance Use Topics  . Alcohol use: No  . Drug use: No   Family History  Problem Relation Age of Onset  .  Hypertension Mother   . Asthma Mother   . Diabetes Father   . Stroke Father   . Heart attack Paternal Grandfather        >55  . Cancer Sister   . Colon cancer Neg Hx   . Prostate cancer Neg Hx    Allergies  Allergen Reactions  . Flecainide     Vomit   . Flexeril [Cyclobenzaprine]     Constipation  . Hctz [Hydrochlorothiazide]     hyponatremia  . Morphine And Related Hives    Hallucinations- can tolerate oxycodone.   . Promethazine     Would avoid due to Parkinsons  . Sinemet [Carbidopa-Levodopa]     intolerant   Current Outpatient Medications on File Prior to Visit  Medication Sig Dispense Refill  . Cetirizine HCl (ALLERGY RELIEF) 10 MG CAPS Take 1 capsule by mouth daily as needed.    . diltiazem (CARDIZEM CD) 180 MG 24 hr capsule TAKE 1 CAPSULE BY MOUTH EVERY DAY 30 capsule 2  . Menthol, Topical Analgesic, (BIOFREEZE EX) Apply 1 application topically daily as needed (pain).    Marland Kitchen omeprazole (PRILOSEC) 20 MG capsule Take 20 mg by mouth daily as needed (heart burn).    . pramipexole (MIRAPEX) 0.5 MG tablet Take 0.5 mg by mouth 3 (three) times daily.    . propranolol (INDERAL) 20 MG tablet Take 1 tablet (20 mg total) by mouth at bedtime. (Patient taking differently: Take 20 mg by mouth at bedtime as needed. )    . pyridOXINE (VITAMIN B-6) 25 MG tablet Take 1 tablet (25 mg total) by mouth 3 (three) times daily as needed (nausea).    . rivaroxaban (XARELTO) 20 MG TABS tablet Take 1 tablet (20 mg total) by mouth daily with supper. 30 tablet 3   No current facility-administered medications on file prior to visit.   Review of Systems  Constitutional: Negative for chills, fever and malaise/fatigue.  HENT: Negative for congestion, ear pain, nosebleeds, sinus pain and sore throat.        Rhinorrhea and post nasal drip  Eyes: Negative for blurred vision, discharge and redness.  Respiratory: Positive for cough. Negative for sputum production, shortness of breath, wheezing and stridor.    Cardiovascular: Negative for chest pain, palpitations and leg swelling.  Gastrointestinal: Negative for abdominal pain, diarrhea, nausea and vomiting.  Musculoskeletal: Negative for myalgias.  Skin: Negative for rash.  Neurological: Negative for dizziness and headaches.    Observations/Objective: Patient appears well, in no distress Weight is baseline  No facial swelling or asymmetry Normal voice-not hoarse and no slurred speech He sniffles occasionally and clears throat  No obvious  tremor or mobility impairment Moving neck and UEs normally Able to hear the call well  No cough or shortness of breath during interview  Talkative and mentally sharp with no cognitive changes No skin changes on face or neck , no rash or pallor Affect is normal    Assessment and Plan: Problem List Items Addressed This Visit      Respiratory   Allergic rhinitis    Seasonal pollen allergies causing itchy throat and rhinorrhea and cough   Adv to continue the cetrizine  Add back fluticasone nasal spray  Sent in tessalon (helped in the past)  Can also try delsym otc if really needed  inst to call if he develops any new symptoms (fever/ change in taste or smell) or if he does not improve  He plans to work outside with a mask-I agree that is wise as well           Follow Up Instructions: Continue your cetrizine  Add fluticasone nasal spray 1-2 sprays in each nostril  Use a mask outside when in the pollen   I sent tessalon to the pharmacy to help with cough   Update if not starting to improve in a week or if worsening   Also if new symptoms (like headache, fever or loss of taste or smell)    I discussed the assessment and treatment plan with the patient. The patient was provided an opportunity to ask questions and all were answered. The patient agreed with the plan and demonstrated an understanding of the instructions.   The patient was advised to call back or seek an in-person evaluation if the  symptoms worsen or if the condition fails to improve as anticipated.     Loura Pardon, MD

## 2019-07-15 ENCOUNTER — Telehealth: Payer: Self-pay | Admitting: Family Medicine

## 2019-07-15 DIAGNOSIS — L84 Corns and callosities: Secondary | ICD-10-CM

## 2019-07-15 NOTE — Telephone Encounter (Signed)
Patient called today He would like to know if you are able to remove a corn that is located on his foot near the small toe, advised I would ask to see if this is something you are able to do. If not we would give him a call with your recommendations

## 2019-07-16 NOTE — Telephone Encounter (Signed)
Patient notified as instructed by telephone and verbalized understanding. Patient stated that he is okay with the referral.

## 2019-07-16 NOTE — Telephone Encounter (Signed)
We usually send patients over to podiatry.  If he is okay with that then we can refer him.  I went ahead and put in the referral.  Let me know if that is not okay.  Thanks.

## 2019-07-21 DIAGNOSIS — M5417 Radiculopathy, lumbosacral region: Secondary | ICD-10-CM | POA: Diagnosis not present

## 2019-07-25 DIAGNOSIS — Z6826 Body mass index (BMI) 26.0-26.9, adult: Secondary | ICD-10-CM | POA: Diagnosis not present

## 2019-07-25 DIAGNOSIS — M5417 Radiculopathy, lumbosacral region: Secondary | ICD-10-CM | POA: Diagnosis not present

## 2019-07-25 DIAGNOSIS — I10 Essential (primary) hypertension: Secondary | ICD-10-CM | POA: Diagnosis not present

## 2019-07-25 DIAGNOSIS — M4807 Spinal stenosis, lumbosacral region: Secondary | ICD-10-CM | POA: Diagnosis not present

## 2019-07-29 ENCOUNTER — Other Ambulatory Visit: Payer: Self-pay

## 2019-07-29 ENCOUNTER — Ambulatory Visit (INDEPENDENT_AMBULATORY_CARE_PROVIDER_SITE_OTHER): Payer: PPO | Admitting: Family Medicine

## 2019-07-29 ENCOUNTER — Encounter: Payer: Self-pay | Admitting: Family Medicine

## 2019-07-29 VITALS — BP 132/76 | HR 84 | Temp 97.1°F | Ht 75.0 in | Wt 206.0 lb

## 2019-07-29 DIAGNOSIS — M9973 Connective tissue and disc stenosis of intervertebral foramina of lumbar region: Secondary | ICD-10-CM | POA: Diagnosis not present

## 2019-07-29 DIAGNOSIS — I480 Paroxysmal atrial fibrillation: Secondary | ICD-10-CM

## 2019-07-29 DIAGNOSIS — Z9889 Other specified postprocedural states: Secondary | ICD-10-CM

## 2019-07-29 DIAGNOSIS — I10 Essential (primary) hypertension: Secondary | ICD-10-CM

## 2019-07-29 DIAGNOSIS — M4807 Spinal stenosis, lumbosacral region: Secondary | ICD-10-CM

## 2019-07-29 DIAGNOSIS — W57XXXD Bitten or stung by nonvenomous insect and other nonvenomous arthropods, subsequent encounter: Secondary | ICD-10-CM

## 2019-07-29 DIAGNOSIS — S70361A Insect bite (nonvenomous), right thigh, initial encounter: Secondary | ICD-10-CM

## 2019-07-29 DIAGNOSIS — I482 Chronic atrial fibrillation, unspecified: Secondary | ICD-10-CM

## 2019-07-29 MED ORDER — PROPRANOLOL HCL 20 MG PO TABS: 20.0000 mg | ORAL_TABLET | Freq: Two times a day (BID) | ORAL | Status: DC

## 2019-07-29 NOTE — Patient Instructions (Signed)
Finish the doxycycline. Take miralax or dulcolax daily if needed.  If your BP is persistently elevated then let me know.  If fever or rash then update me.  Take care.  Glad to see you.

## 2019-07-29 NOTE — Progress Notes (Signed)
This visit occurred during the SARS-CoV-2 public health emergency.  Safety protocols were in place, including screening questions prior to the visit, additional usage of staff PPE, and extensive cleaning of exam room while observing appropriate contact time as indicated for disinfecting solutions.  Tick bite.  Started on doxy in the meantime.  Likely a black legged tick, based on the sample he brought in and identified comparison photos.  I showed him the photos and he agreed.  Pulled off 07/25/19.  He was concerned about retained parts from the tick.  He is not feeling unwell otherwise.  No fever chills nausea vomiting or rash.  He has some bruising on the outside of the arms with blood thinner.  No bleeding o/w.  He does have bruising on the medial portion of the arms.  Hypertension:  Last week his BP was elevated but he was asymptomatic.  Recheck today wnl.  D/w pt.  No CP.    He has chronic back pain at baseline.  He is taking tylenol at baseline but still with some pain.  I told him I needed to check on options.  See follow-up phone note.  Meds, vitals, and allergies reviewed.   PMH and SH reviewed  ROS: Per HPI unless specifically indicated in ROS section   GEN: nad, alert and oriented NECK:  ncat CV: rrr. PULM: ctab, no inc wob EXT: no edema SKIN: no acute rash but he does have what looks to be a small amount of retained material at the tick bite site on the right medial thigh.  I cleaned this with alcohol and then was able to remove the visible retained part/scab with an 18-gauge needle and tweezers.  He tolerated this.  No bleeding.  I covered it with a Band-Aid.  No complications.  He does have some bruising on the extensor surface of the forearms bilaterally.

## 2019-07-30 ENCOUNTER — Telehealth: Payer: Self-pay | Admitting: Family Medicine

## 2019-07-30 DIAGNOSIS — W57XXXA Bitten or stung by nonvenomous insect and other nonvenomous arthropods, initial encounter: Secondary | ICD-10-CM | POA: Insufficient documentation

## 2019-07-30 MED ORDER — TRAMADOL HCL 50 MG PO TABS: 50.0000 mg | ORAL_TABLET | Freq: Two times a day (BID) | ORAL | 0 refills | Status: DC | PRN

## 2019-07-30 NOTE — Assessment & Plan Note (Signed)
He still has some residual back pain and I will check options in the meantime.  He agrees.  Tylenol has not been completely effective.

## 2019-07-30 NOTE — Telephone Encounter (Signed)
Patient notified as instructed by telephone and verbalized understanding. 

## 2019-07-30 NOTE — Telephone Encounter (Signed)
I sent the prescription for tramadol.  Sedation caution on medication.  He had hives with morphine but has been able to tolerate oxycodone in the past and I expect him to be able to tolerate tramadol.  If he is drowsy or if he has another adverse reaction to this then stop it and let me know.  I would try taking it up to twice a day if needed for pain.  He can continue to take Tylenol with this.  Thanks.

## 2019-07-30 NOTE — Assessment & Plan Note (Signed)
I think it makes sense to continue with anticoagulation since is not having bleeding otherwise.  I would expect some bruising on the forearms on the extensor side.  Discussed with patient.  He agrees.

## 2019-07-30 NOTE — Assessment & Plan Note (Signed)
Already treated with doxycycline.  Continue that in the meantime.  Update me as needed.  All visible retained parts removed.  Tolerated well.  He will update me as needed.

## 2019-07-30 NOTE — Telephone Encounter (Signed)
Pt called in and said yesterday you told him that you were going to check on Tramadol prescription to be sent in to [facility] for him. He is wondering when this will be sent in. Please advise.

## 2019-07-30 NOTE — Assessment & Plan Note (Signed)
He will update me if persistent blood pressure elevation.

## 2019-08-04 ENCOUNTER — Other Ambulatory Visit: Payer: Self-pay | Admitting: Cardiology

## 2019-08-04 DIAGNOSIS — I480 Paroxysmal atrial fibrillation: Secondary | ICD-10-CM

## 2019-08-07 ENCOUNTER — Ambulatory Visit: Payer: PPO | Admitting: Cardiology

## 2019-08-14 ENCOUNTER — Encounter: Payer: Self-pay | Admitting: Emergency Medicine

## 2019-08-14 ENCOUNTER — Telehealth: Payer: Self-pay | Admitting: Family Medicine

## 2019-08-14 ENCOUNTER — Ambulatory Visit
Admission: EM | Admit: 2019-08-14 | Discharge: 2019-08-14 | Disposition: A | Discharge status: Home / Self Care | Payer: PPO | Attending: Emergency Medicine | Admitting: Emergency Medicine

## 2019-08-14 ENCOUNTER — Other Ambulatory Visit: Payer: Self-pay

## 2019-08-14 DIAGNOSIS — J029 Acute pharyngitis, unspecified: Secondary | ICD-10-CM | POA: Diagnosis not present

## 2019-08-14 HISTORY — DX: Parkinson's disease: G20

## 2019-08-14 HISTORY — DX: Parkinson's disease without dyskinesia, without mention of fluctuations: G20.A1

## 2019-08-14 LAB — POCT RAPID STREP A (OFFICE): Rapid Strep A Screen: NEGATIVE

## 2019-08-14 NOTE — Discharge Instructions (Signed)
Your rapid strep test is negative.  A throat culture is pending; we will call you if it is positive requiring treatment.    Take Tylenol as needed for discomfort.  Try salt water gargles.    Follow up with your primary care provider if your symptoms are not improving.

## 2019-08-14 NOTE — Telephone Encounter (Signed)
Pt said he called after hour line this morning to get an appointment with Dr. Damita Dunnings because he has white bumps in the back of his throat. I spoke with the triage nurse and she asked if pt had a sore throat or scratchy throat. Pt said his throat was itchy and he said it was like he had cotton mouth. I let patient know that triage nurse advised him to go to Urgent Care to get swabbed since we couldn't see him in office.

## 2019-08-14 NOTE — ED Provider Notes (Signed)
[name]    CSN: [id number] Arrival date & time: 08/14/19  0913      History   Chief Complaint Chief Complaint  Patient presents with  . Sore Throat    HPI [name] is a 74 y.o. male.   Patient presents with sore throat and white patches on his throat since last night.  He denies fever, chills, cough, shortness of breath, vomiting, diarrhea, rash, or other symptoms.  Treatment attempted at home with Tylenol.  No known sick contacts.    The history is provided by the patient.    Past Medical History:  Diagnosis Date  . Arthritis   . GERD (gastroesophageal reflux disease)   . H/O urinary frequency   . Heart murmur    as a child  . History of bronchitis   . HSV infection    oral  . Hypertension    improved after back pain treated with surgery  . Insomnia   . Joint pain   . OSA on CPAP    he ordered kit and started use 2017 w/o formal testing, see note from 11/30/15.   . Parkinson disease ([place])   . Vertigo 10/13/2014   recurrent; trigger is cool air on left ear, improved with daily antihistamine    Patient Active Problem List   Diagnosis Date Noted  . Insect bite 07/30/2019  . Allergic rhinitis 07/03/2019  . Hyponatremia 05/15/2019  . Atrial fibrillation, chronic ([place]) 05/15/2019  . Parkinson disease ([place]) 05/04/2019  . 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 Atrial fibrillation with RVR ([place]) 04/24/2019  . History of recent back surgery 04/24/2019  . 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 Cough 04/24/2018  . Skin lesion 10/31/2017  . Routine health maintenance 04/23/2017  . Lower urinary tract symptoms (LUTS) 04/17/2016  . Oral lesion 02/16/2016  . Medicare annual wellness visit, initial 12/01/2015  . Advance care planning 12/01/2015  . Lower back pain 12/01/2015  . OSA on CPAP   . HTN (hypertension) 04/23/2015  . Spondylolisthesis of lumbar region 12/16/2014  . GERD  (gastroesophageal reflux disease) 10/11/2012  . RBBB 06/27/2011    Past Surgical History:  Procedure Laterality Date  . ACHILLES TENDON REPAIR Left   . BACK SURGERY    . COLONOSCOPY  02/2011   Negative,[facility]  . DEBRIDEMENT AND CLOSURE WOUND Left 02/25/2018   Procedure: DEBRIDEMENT LEFT LEG APPLICATION INTEGRA;  Surgeon: Irene Limbo, MD;  Location: [facility];  Service: Plastics;  Laterality: Left;  . EYE SURGERY Bilateral    cataracts  . KNEE ARTHROSCOPY     L  . LUMBAR FUSION     2016  . LUMBAR SPINE SURGERY  03/31/2019   exploration of lumbar fusion w/L5-S1 decompression redo L4-5 fusion  . SPINE SURGERY  2003   cervical fusion  . TONSILLECTOMY    . varicoelectomy         Home Medications    Prior to Admission medications   Medication Sig Start Date End Date Taking? Authorizing Provider  Cetirizine HCl (ALLERGY RELIEF) 10 MG CAPS Take 1 capsule by mouth daily as needed.   Yes [provider]  diazepam (VALIUM) 5 MG tablet Take 5 mg by mouth daily as needed for anxiety.   Yes [provider]  diltiazem (CARDIZEM CD) 180 MG 24 hr capsule TAKE 1 CAPSULE BY MOUTH EVERY DAY 08/05/19  Yes Adrian Prows, MD  omeprazole (PRILOSEC) 20 MG  capsule Take 20 mg by mouth daily as needed (heart burn).   Yes [provider]  pramipexole (MIRAPEX) 0.5 MG tablet Take 0.5 mg by mouth 3 (three) times daily.   Yes [provider]  propranolol (INDERAL) 20 MG tablet Take 1 tablet (20 mg total) by mouth 2 (two) times daily. 07/29/19  Yes Tonia [place], MD  pyridOXINE (VITAMIN B-6) 25 MG tablet Take 1 tablet (25 mg total) by mouth 3 (three) times daily as needed (nausea). 05/15/19  Yes Tonia [place], MD  rivaroxaban (XARELTO) 20 MG TABS tablet Take 1 tablet (20 mg total) by mouth daily with supper. 05/17/19  Yes Georgette Shell, MD  traMADol (ULTRAM) 50 MG tablet Take 1 tablet (50 mg total) by mouth every 12 (twelve) hours as  needed. 07/30/19  Yes Tonia [place], MD  benzonatate (TESSALON) 200 MG capsule Take 1 capsule (200 mg total) by mouth 3 (three) times daily as needed for cough. Do not bite pill 07/03/19   Tower, Wynelle Fanny, MD  doxycycline (VIBRAMYCIN) 100 MG capsule Take 100 mg by mouth 2 (two) times daily.    [provider]  Menthol, Topical Analgesic, (BIOFREEZE EX) Apply 1 application topically daily as needed (pain).    [provider]    Family History Family History  Problem Relation Age of Onset  . Hypertension Mother   . Asthma Mother   . Diabetes Father   . Stroke Father   . Heart attack Paternal Grandfather        >55  . Cancer Sister   . Colon cancer Neg Hx   . Prostate cancer Neg Hx     Social History Social History   Tobacco Use  . Smoking status: Never Smoker  . Smokeless tobacco: Never Used  Substance Use Topics  . Alcohol use: No  . Drug use: No     Allergies   Flecainide, Flexeril [cyclobenzaprine], Hctz [hydrochlorothiazide], Morphine and related, Promethazine, and Sinemet [carbidopa-levodopa]   Review of Systems Review of Systems  Constitutional: Negative for chills and fever.  HENT: Positive for sore throat. Negative for congestion, ear pain and rhinorrhea.   Eyes: Negative for pain and visual disturbance.  Respiratory: Negative for cough and shortness of breath.   Cardiovascular: Negative for chest pain and palpitations.  Gastrointestinal: Negative for abdominal pain, diarrhea, nausea and vomiting.  Genitourinary: Negative for dysuria and hematuria.  Musculoskeletal: Negative for arthralgias and back pain.  Skin: Negative for color change and rash.  Neurological: Negative for seizures and syncope.  All other systems reviewed and are negative.    Physical Exam Triage Vital Signs ED Triage Vitals  Enc Vitals Group     BP      Pulse      Resp      Temp      Temp src      SpO2      Weight      Height      Head Circumference      Peak  Flow      Pain Score      Pain Loc      Pain Edu?      Excl. in [place]?    No data found.  Updated Vital Signs BP (!) 148/88 (BP Location: Left Arm)   Pulse 66   Temp 98.2 F (36.8 C) (Oral)   Resp 18   Ht _0  (1.905 m)   Wt 210 lb (95.3 kg)  SpO2 96%   BMI 26.25 kg/m   Visual Acuity Right Eye Distance:   Left Eye Distance:   Bilateral Distance:    Right Eye Near:   Left Eye Near:    Bilateral Near:     Physical Exam Vitals and nursing note reviewed.  Constitutional:      Appearance: He is well-developed.  HENT:     Head: Normocephalic and atraumatic.     Right Ear: Tympanic membrane normal.     Left Ear: Tympanic membrane normal.     Nose: Nose normal.     Mouth/Throat:     Mouth: Mucous membranes are moist.     Pharynx: Oropharyngeal exudate and posterior oropharyngeal erythema present.     Tonsils: 0 on the right. 0 on the left.  Eyes:     Conjunctiva/sclera: Conjunctivae normal.  Cardiovascular:     Rate and Rhythm: Normal rate and regular rhythm.     Heart sounds: No murmur.  Pulmonary:     Effort: Pulmonary effort is normal. No respiratory distress.     Breath sounds: Normal breath sounds.  Abdominal:     Palpations: Abdomen is soft.     Tenderness: There is no abdominal tenderness. There is no guarding or rebound.  Musculoskeletal:     Cervical back: Neck supple.  Skin:    General: Skin is warm and dry.     Findings: No rash.  Neurological:     General: No focal deficit present.     Mental Status: He is alert and oriented to person, place, and time.  Psychiatric:        Mood and Affect: Mood normal.        Behavior: Behavior normal.      UC Treatments / Results  Labs (all labs ordered are listed, but only abnormal results are displayed) Labs Reviewed  CULTURE, GROUP A STREP [facility])  POCT RAPID STREP A (OFFICE)    EKG   Radiology No results found.  Procedures Procedures (including critical care time)  Medications Ordered in  UC Medications - No data to display  Initial Impression / Assessment and Plan / UC Course  I have reviewed the triage vital signs and the nursing notes.  Pertinent labs & imaging results that were available during my care of the patient were reviewed by me and considered in my medical decision making (see chart for details).   Acute pharyngitis.  Rapid strep negative; throat culture pending.  Discussed that we will call him if his throat culture is positive requiring treatment.  Instructed patient to take Tylenol as needed for discomfort and to try salt water gargles.  Instructed him to follow-up with his PCP if his symptoms or not improving.  Patient agrees to plan of care   Final Clinical Impressions(s) / UC Diagnoses   Final diagnoses:  Acute pharyngitis, unspecified etiology     Discharge Instructions     Your rapid strep test is negative.  A throat culture is pending; we will call you if it is positive requiring treatment.    Take Tylenol as needed for discomfort.  Try salt water gargles.    Follow up with your primary care provider if your symptoms are not improving.          ED Prescriptions    None     PDMP not reviewed this encounter.   Sharion Balloon, NP 08/14/19 [phone]

## 2019-08-14 NOTE — Telephone Encounter (Signed)
Noted. Thanks.  Will await the UC note.

## 2019-08-14 NOTE — Telephone Encounter (Signed)
Pt called earlier with S/T and white bumps on back of throat and advised [place] pt should go to [facility] in [place] in case he needs strep testing. Per chart review testing pt is at [facility] now.FYI to Dr Damita Dunnings.

## 2019-08-14 NOTE — ED Triage Notes (Signed)
Patient in today c/o sore throat since last night. Patient states that his wife looked in his throat and saw white spots.

## 2019-08-14 NOTE — Telephone Encounter (Signed)
[facility] Night - Client Nonclinical Telephone Record  [facility] Client [facility] Night - Client Client Site [facility] [place] Physician Renford Dills - MD Contact Type Call Who Is Calling Patient / Member / Family / Caregiver Caller Name [place] Phone Number [phone] Patient Name [name] Patient DOB Sep 02, 1945 Call Type Message Only Information Provided Reason for Call Request to Schedule Office Appointment Initial Comment Caller states he has white spots in his throat and will need to be seen today 08/14/2019. Caller is requesting a call back when the office reopens this morning to discuss getting scheduled asap to be seen for his throat. Additional Comment Office hours provided. Disp. Time Disposition Final User 08/14/2019 8:01:08 AM General Information Provided Yes Bledsoe, Sadie Call Closed By: Malissa Hippo Transaction Date/Time: 08/14/2019 7:57:42 AM (ET

## 2019-08-15 ENCOUNTER — Other Ambulatory Visit: Payer: Self-pay

## 2019-08-15 ENCOUNTER — Ambulatory Visit
Admission: EM | Admit: 2019-08-15 | Discharge: 2019-08-15 | Disposition: A | Discharge status: Home / Self Care | Payer: PPO | Attending: Family Medicine | Admitting: Family Medicine

## 2019-08-15 ENCOUNTER — Encounter: Payer: Self-pay | Admitting: Emergency Medicine

## 2019-08-15 DIAGNOSIS — R5383 Other fatigue: Secondary | ICD-10-CM

## 2019-08-15 DIAGNOSIS — J039 Acute tonsillitis, unspecified: Secondary | ICD-10-CM

## 2019-08-15 MED ORDER — LIDOCAINE VISCOUS HCL 2 % MT SOLN: 15.0000 mL | OROMUCOSAL | 0 refills | Status: DC | PRN

## 2019-08-15 MED ORDER — CEFTRIAXONE SODIUM 500 MG IJ SOLR
500.0000 mg | Freq: Once | INTRAMUSCULAR | Status: AC
  Administered 2019-08-15: 500 mg via INTRAMUSCULAR

## 2019-08-15 MED ORDER — DEXAMETHASONE SODIUM PHOSPHATE 10 MG/ML IJ SOLN
10.0000 mg | Freq: Once | INTRAMUSCULAR | Status: AC
  Administered 2019-08-15: 10 mg via INTRAMUSCULAR

## 2019-08-15 MED ORDER — AMOXICILLIN 875 MG PO TABS: 875.0000 mg | ORAL_TABLET | Freq: Two times a day (BID) | ORAL | 0 refills | Status: AC

## 2019-08-15 MED ORDER — PREDNISONE 10 MG (21) PO TBPK: ORAL_TABLET | Freq: Every day | ORAL | 0 refills | Status: AC

## 2019-08-15 NOTE — ED Provider Notes (Signed)
[name]    CSN: [id number] Arrival date & time: 08/15/19  0803      History   Chief Complaint Chief Complaint  Patient presents with  . Sore Throat    HPI [name] is a 74 y.o. male.   Reports throat pain for the last 3 days. Was seen in this urgent care office yesterday and had rapid strep test that was negative. Strep culture is pending. States that his throat is feeling worse than it did yesterday when he was here. Reports pain with swallowing, has not wanted to eat or drink. Has done salt water gargles and chloraseptic spray with little relief. Denies HA, SOB, n/v/d, rash, fever, other symptoms. Per chart review, patient has history significant for Parkinson's Disease, A fib, RBBB, HTN.   ROS per HPI  The history is provided by the patient.  Sore Throat    Past Medical History:  Diagnosis Date  . Arthritis   . GERD (gastroesophageal reflux disease)   . H/O urinary frequency   . Heart murmur    as a child  . History of bronchitis   . HSV infection    oral  . Hypertension    improved after back pain treated with surgery  . Insomnia   . Joint pain   . OSA on CPAP    he ordered kit and started use 2017 w/o formal testing, see note from 11/30/15.   . Parkinson disease ([place])   . Vertigo 10/13/2014   recurrent; trigger is cool air on left ear, improved with daily antihistamine    Patient Active Problem List   Diagnosis Date Noted  . Insect bite 07/30/2019  . Allergic rhinitis 07/03/2019  . Hyponatremia 05/15/2019  . Atrial fibrillation, chronic ([place]) 05/15/2019  . Parkinson disease ([place]) 05/04/2019  . 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 Atrial fibrillation with RVR ([place]) 04/24/2019  . History of recent back surgery 04/24/2019  . 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 Cough 04/24/2018  . Skin lesion 10/31/2017  . Routine health maintenance 04/23/2017  . Lower urinary tract  symptoms (LUTS) 04/17/2016  . Oral lesion 02/16/2016  . Medicare annual wellness visit, initial 12/01/2015  . Advance care planning 12/01/2015  . Lower back pain 12/01/2015  . OSA on CPAP   . HTN (hypertension) 04/23/2015  . Spondylolisthesis of lumbar region 12/16/2014  . GERD (gastroesophageal reflux disease) 10/11/2012  . RBBB 06/27/2011    Past Surgical History:  Procedure Laterality Date  . ACHILLES TENDON REPAIR Left   . BACK SURGERY    . COLONOSCOPY  02/2011   Negative,[facility]  . DEBRIDEMENT AND CLOSURE WOUND Left 02/25/2018   Procedure: DEBRIDEMENT LEFT LEG APPLICATION INTEGRA;  Surgeon: Irene Limbo, MD;  Location: [facility];  Service: Plastics;  Laterality: Left;  . EYE SURGERY Bilateral    cataracts  . KNEE ARTHROSCOPY     L  . LUMBAR FUSION     2016  . LUMBAR SPINE SURGERY  03/31/2019   exploration of lumbar fusion w/L5-S1 decompression redo L4-5 fusion  . SPINE SURGERY  2003   cervical fusion  . TONSILLECTOMY    . varicoelectomy         Home Medications    Prior to Admission medications   Medication Sig Start Date End Date Taking? Authorizing Provider  Cetirizine HCl (ALLERGY RELIEF) 10 MG CAPS Take 1 capsule by mouth daily as needed.   Yes  [provider]  diazepam (VALIUM) 5 MG tablet Take 5 mg by mouth daily as needed for anxiety.   Yes [provider]  diltiazem (CARDIZEM CD) 180 MG 24 hr capsule TAKE 1 CAPSULE BY MOUTH EVERY DAY 08/05/19  Yes Adrian Prows, MD  doxycycline (VIBRAMYCIN) 100 MG capsule Take 100 mg by mouth 2 (two) times daily.   Yes [provider]  Menthol, Topical Analgesic, (BIOFREEZE EX) Apply 1 application topically daily as needed (pain).   Yes [provider]  omeprazole (PRILOSEC) 20 MG capsule Take 20 mg by mouth daily as needed (heart burn).   Yes [provider]  pramipexole (MIRAPEX) 0.5 MG tablet Take 0.5 mg by mouth 3 (three) times daily.   Yes [provider]  propranolol (INDERAL) 20 MG tablet Take 1 tablet (20 mg total) by mouth 2 (two) times daily. 07/29/19  Yes Tonia Ghent, MD  pyridOXINE (VITAMIN B-6) 25 MG tablet Take 1 tablet (25 mg total) by mouth 3 (three) times daily as needed (nausea). 05/15/19  Yes Tonia Ghent, MD  rivaroxaban (XARELTO) 20 MG TABS tablet Take 1 tablet (20 mg total) by mouth daily with supper. 05/17/19  Yes Georgette Shell, MD  traMADol (ULTRAM) 50 MG tablet Take 1 tablet (50 mg total) by mouth every 12 (twelve) hours as needed. 07/30/19  Yes Tonia Ghent, MD  amoxicillin (AMOXIL) 875 MG tablet Take 1 tablet (875 mg total) by mouth 2 (two) times daily for 10 days. 08/15/19 08/25/19  Faustino Congress, NP  benzonatate (TESSALON) 200 MG capsule Take 1 capsule (200 mg total) by mouth 3 (three) times daily as needed for cough. Do not bite pill 07/03/19   Tower, Roque Lias A, MD  lidocaine (XYLOCAINE) 2 % solution Use as directed 15 mLs in the mouth or throat every 4 (four) hours as needed for mouth pain. 08/15/19   Faustino Congress, NP  predniSONE (STERAPRED UNI-PAK 21 TAB) 10 MG (21) TBPK tablet Take by mouth daily for 6 days. Take 6 tablets on day 1, 5 tablets on day 2, 4 tablets on day 3, 3 tablets on day 4, 2 tablets on day 5, 1 tablet on day 6 08/15/19 08/21/19  Faustino Congress, NP    Family History Family History  Problem Relation Age of Onset  . Hypertension Mother   . Asthma Mother   . Diabetes Father   . Stroke Father   . Heart attack Paternal Grandfather        >55  . Cancer Sister   . Colon cancer Neg Hx   . Prostate cancer Neg Hx     Social History Social History   Tobacco Use  . Smoking status: Never Smoker  . Smokeless tobacco: Never Used  Substance Use Topics  . Alcohol use: No  . Drug use: No     Allergies   Flecainide, Flexeril [cyclobenzaprine], Hctz [hydrochlorothiazide], Morphine and related, Promethazine, and Sinemet [carbidopa-levodopa]   Review of  Systems Review of Systems   Physical Exam Triage Vital Signs ED Triage Vitals  Enc Vitals Group     BP 08/15/19 0810 (!) 153/82     Pulse Rate 08/15/19 0810 95     Resp 08/15/19 0810 18     Temp 08/15/19 0810 98.2 F (36.8 C)     Temp Source 08/15/19 0810 Oral     SpO2 08/15/19 0810 98 %     Weight 08/15/19 0811 210 lb 1.6 oz (95.3 kg)  Height 08/15/19 0811 '6\' 3"'$  (1.905 m)     Head Circumference --      Peak Flow --      Pain Score 08/15/19 0811 8     Pain Loc --      Pain Edu? --      Excl. in [place]? --    No data found.  Updated Vital Signs BP (!) 153/82 (BP Location: Left Arm)   Pulse 95   Temp 98.2 F (36.8 C) (Oral)   Resp 18   Ht '6\' 3"'$  (1.905 m)   Wt 210 lb 1.6 oz (95.3 kg)   SpO2 98%   BMI 26.26 kg/m   Visual Acuity Right Eye Distance:   Left Eye Distance:   Bilateral Distance:    Right Eye Near:   Left Eye Near:    Bilateral Near:     Physical Exam Vitals and nursing note reviewed.  Constitutional:      General: He is not in acute distress.    Appearance: He is well-developed and normal weight. He is ill-appearing.  HENT:     Head: Normocephalic and atraumatic.     Right Ear: Tympanic membrane normal.     Left Ear: Tympanic membrane normal.     Nose: No congestion or rhinorrhea.     Mouth/Throat:     Mouth: Mucous membranes are moist.     Pharynx: Pharyngeal swelling, oropharyngeal exudate and posterior oropharyngeal erythema present.     Tonsils: Tonsillar exudate present. 1+ on the right. 1+ on the left.  Eyes:     Conjunctiva/sclera: Conjunctivae normal.     Pupils: Pupils are equal, round, and reactive to light.  Cardiovascular:     Rate and Rhythm: Normal rate and regular rhythm.     Heart sounds: Normal heart sounds. No murmur.  Pulmonary:     Effort: Pulmonary effort is normal. No respiratory distress.     Breath sounds: Normal breath sounds. No stridor. No wheezing, rhonchi or rales.  Chest:     Chest wall: No tenderness.   Abdominal:     Palpations: Abdomen is soft.     Tenderness: There is no abdominal tenderness.  Musculoskeletal:     Cervical back: Normal range of motion and neck supple.  Lymphadenopathy:     Cervical: Cervical adenopathy (bilateral) present.  Skin:    General: Skin is warm and dry.     Capillary Refill: Capillary refill takes less than 2 seconds.  Neurological:     General: No focal deficit present.     Mental Status: He is alert and oriented to person, place, and time.  Psychiatric:        Mood and Affect: Mood normal.        Behavior: Behavior normal.      UC Treatments / Results  Labs (all labs ordered are listed, but only abnormal results are displayed) Labs Reviewed - No data to display  EKG   Radiology No results found.  Procedures Procedures (including critical care time)  Medications Ordered in UC Medications  cefTRIAXone (ROCEPHIN) injection 500 mg (500 mg Intramuscular Given 08/15/19 0846)  dexamethasone (DECADRON) injection 10 mg (10 mg Intramuscular Given 08/15/19 0846)    Initial Impression / Assessment and Plan / UC Course  I have reviewed the triage vital signs and the nursing notes.  Pertinent labs & imaging results that were available during my care of the patient were reviewed by me and considered in my medical decision making (see chart  for details).    Fatigue Tonsillitis: Presents with sore throat for the last 3 days.  Patient was seen here at this urgent care yesterday, rapid strep in office yesterday was negative.  Strep culture still pending today.  Has used salt water gargles with no relief.  On exam, lungs CTA bilaterally.  Bilateral cervical adenopathy noted, most tender on the left side.  Oropharyngeal erythema, tonsillar swelling +1 bilaterally.  Also noted white exudate on both tonsils bilaterally.  Rocephin 500 mg IM given in office today.  Decadron 10 mg IM given in office today.  Prescribed amoxicillin 875 twice daily for the next 10  days.  Prescribed prednisone taper to help with swelling.  Discussed with patient how to take this medication.  Also prescribed viscous lidocaine for patient to swish, gargle for throat pain.  Instructed patient that if he is not feeling better over the next 2 days, to follow-up with this office or with primary care.  Also instructed patient to go to the ER for trouble swallowing, trouble breathing, other concerning symptoms.  Patient verbalized understanding is in agreement with treatment plan.  Final Clinical Impressions(s) / UC Diagnoses   Final diagnoses:  Acute tonsillitis, unspecified etiology  Other fatigue     Discharge Instructions     You have received an antibiotic injection today called Rocephin.  You have also received a steroid injection in the office.   I have sent in amoxicillin for you to take twice daily for the next 10 days. Take all of the medication even if you are feeling better.  I have also prescribed a steroid taper for you to take. Take 6 tablets on day 1, 5 tabs on day 2, 4 tabs on day 3, 3 tabs on day 4, 2 tabs on day 5 and 1 tab on day 6.  I have also prescribed viscous lidocaine for you to swish, gargle and spit every 4-6 hours as needed for throat pain.  Follow up with this office or with primary care if you are not feeling better over the next 2 days.   If you are having trouble swallowing, trouble breathing, or other concerning symptoms, go directly to the ER.    ED Prescriptions    Medication Sig Dispense Auth. Provider   amoxicillin (AMOXIL) 875 MG tablet Take 1 tablet (875 mg total) by mouth 2 (two) times daily for 10 days. 20 tablet Faustino Congress, NP   predniSONE (STERAPRED UNI-PAK 21 TAB) 10 MG (21) TBPK tablet Take by mouth daily for 6 days. Take 6 tablets on day 1, 5 tablets on day 2, 4 tablets on day 3, 3 tablets on day 4, 2 tablets on day 5, 1 tablet on day 6 21 tablet Faustino Congress, NP   lidocaine (XYLOCAINE) 2 % solution Use as  directed 15 mLs in the mouth or throat every 4 (four) hours as needed for mouth pain. 100 mL Faustino Congress, NP     PDMP not reviewed this encounter.   Faustino Congress, NP 08/15/19 [phone]

## 2019-08-15 NOTE — ED Triage Notes (Signed)
Pt c/o throat pain and swelling. He states he feels like his throat is so swollen that he can not breathe. He can not eat anything. He was seen yesterday for this and tested for strep, which was negative.

## 2019-08-15 NOTE — Discharge Instructions (Addendum)
You have received an antibiotic injection today called Rocephin.  You have also received a steroid injection in the office.   I have sent in amoxicillin for you to take twice daily for the next 10 days. Take all of the medication even if you are feeling better.  I have also prescribed a steroid taper for you to take. Take 6 tablets on day 1, 5 tabs on day 2, 4 tabs on day 3, 3 tabs on day 4, 2 tabs on day 5 and 1 tab on day 6.  I have also prescribed viscous lidocaine for you to swish, gargle and spit every 4-6 hours as needed for throat pain.  Follow up with this office or with primary care if you are not feeling better over the next 2 days.   If you are having trouble swallowing, trouble breathing, or other concerning symptoms, go directly to the ER.

## 2019-08-16 LAB — CULTURE, GROUP A STREP (THRC)

## 2019-08-20 DIAGNOSIS — M4316 Spondylolisthesis, lumbar region: Secondary | ICD-10-CM | POA: Diagnosis not present

## 2019-08-20 DIAGNOSIS — M5417 Radiculopathy, lumbosacral region: Secondary | ICD-10-CM | POA: Diagnosis not present

## 2019-08-20 DIAGNOSIS — M4807 Spinal stenosis, lumbosacral region: Secondary | ICD-10-CM | POA: Diagnosis not present

## 2019-08-28 ENCOUNTER — Ambulatory Visit (INDEPENDENT_AMBULATORY_CARE_PROVIDER_SITE_OTHER): Payer: PPO | Admitting: Family Medicine

## 2019-08-28 ENCOUNTER — Other Ambulatory Visit: Payer: Self-pay

## 2019-08-28 ENCOUNTER — Encounter: Payer: Self-pay | Admitting: Family Medicine

## 2019-08-28 VITALS — BP 136/72 | HR 59 | Temp 97.5°F | Ht 75.0 in | Wt 210.5 lb

## 2019-08-28 DIAGNOSIS — J029 Acute pharyngitis, unspecified: Secondary | ICD-10-CM | POA: Insufficient documentation

## 2019-08-28 MED ORDER — NYSTATIN 100000 UNIT/ML MT SUSP
5.0000 mL | Freq: Four times a day (QID) | OROMUCOSAL | 0 refills | Status: DC
Start: 2019-08-28 — End: 2019-09-10

## 2019-08-28 NOTE — Assessment & Plan Note (Signed)
Very unclear what [facility] was treating with antibiotics and prednisone... per pt told he had thrush, but note does not refect this.  Strep culture ultimately returned negative.. doubt bacterial infection.  More liekly viral infection vs allergies vs thrush.  Has improved significantly ( due to time likely as opposed to antibiotics) which would be consistent with viral infeciton.  He has been on antibiotics and steroids.. no visible abnormality in throat... but will cover with nystatin for possible thrush in pharynx.  Otherwise symptomatic care.   follow up if not continuing to improve.. especially with fatigue.

## 2019-08-28 NOTE — Patient Instructions (Addendum)
Start nystatin swish and gargle. If not improving follow up with PCP.

## 2019-08-28 NOTE — Progress Notes (Signed)
Chief Complaint  Patient presents with  . Hospitalization Follow-up    Here for UC f/u for thrush.     History of Present Illness: HPI  74 year old male pt of Dr. Josefine Class  With history of parkinson's diseasepresents for follow up on urgent Care visit. He reports he was seen at urgent Care on 4/29 and 4/30 ( reviewed notes in detail)  He had neg  Rapid strep and strep culture on  08/14/2019 Given rocephin 500 IM x 1 and decadron 10 mg IM Started on amoxicillin 875 mg daily x 10 days, pred taper and lidocaine gargle. Per pt told they were treating for thrush but they did not give him an antifungal med.  This caused constipation, which is now resolved.  Today he states that the whtie plaque has improved. Still some sore throat. Laryngitis has improved. He feels very tired still.  He continues to have pain in left lateral throat, posterior oropharynx, none on right .  No fever, no HA, no cough, no SOB.   He has not had COVID19 vaccine or testing.   This visit occurred during the SARS-CoV-2 public health emergency.  Safety protocols were in place, including screening questions prior to the visit, additional usage of staff PPE, and extensive cleaning of exam room while observing appropriate contact time as indicated for disinfecting solutions.   COVID 19 screen:  No recent travel or known exposure to COVID19 The patient denies respiratory symptoms of COVID 19 at this time. The importance of social distancing was discussed today.     Review of Systems  Constitutional: Positive for malaise/fatigue. Negative for chills and fever.  HENT: Positive for sore throat. Negative for congestion and ear pain.   Eyes: Negative for pain and redness.  Respiratory: Negative for cough and shortness of breath.   Cardiovascular: Negative for chest pain, palpitations and leg swelling.  Gastrointestinal: Negative for abdominal pain, blood in stool, constipation, diarrhea, nausea and vomiting.   Genitourinary: Negative for dysuria.  Musculoskeletal: Negative for falls and myalgias.  Skin: Negative for rash.  Neurological: Negative for dizziness.  Psychiatric/Behavioral: Negative for depression. The patient is not nervous/anxious.       Past Medical History:  Diagnosis Date  . Arthritis   . GERD (gastroesophageal reflux disease)   . H/O urinary frequency   . Heart murmur    as a child  . History of bronchitis   . HSV infection    oral  . Hypertension    improved after back pain treated with surgery  . Insomnia   . Joint pain   . OSA on CPAP    he ordered kit and started use 2017 w/o formal testing, see note from 11/30/15.   . Parkinson disease ([place])   . Vertigo 10/13/2014   recurrent; trigger is cool air on left ear, improved with daily antihistamine    reports that he has never smoked. He has never used smokeless tobacco. He reports that he does not drink alcohol or use drugs.   Current Outpatient Medications:  .  benzonatate (TESSALON) 200 MG capsule, Take 1 capsule (200 mg total) by mouth 3 (three) times daily as needed for cough. Do not bite pill, Disp: 30 capsule, Rfl: 1 .  Cetirizine HCl (ALLERGY RELIEF) 10 MG CAPS, Take 1 capsule by mouth daily as needed., Disp: , Rfl:  .  diazepam (VALIUM) 5 MG tablet, Take 5 mg by mouth daily as needed for anxiety., Disp: , Rfl:  .  diltiazem (  CARDIZEM CD) 180 MG 24 hr capsule, TAKE 1 CAPSULE BY MOUTH EVERY DAY, Disp: 90 capsule, Rfl: 0 .  doxycycline (VIBRAMYCIN) 100 MG capsule, Take 100 mg by mouth 2 (two) times daily., Disp: , Rfl:  .  lidocaine (XYLOCAINE) 2 % solution, Use as directed 15 mLs in the mouth or throat every 4 (four) hours as needed for mouth pain., Disp: 100 mL, Rfl: 0 .  Menthol, Topical Analgesic, (BIOFREEZE EX), Apply 1 application topically daily as needed (pain)., Disp: , Rfl:  .  omeprazole (PRILOSEC) 20 MG capsule, Take 20 mg by mouth daily as needed (heart burn)., Disp: , Rfl:  .  pramipexole (MIRAPEX)  0.5 MG tablet, Take 0.5 mg by mouth 3 (three) times daily., Disp: , Rfl:  .  propranolol (INDERAL) 20 MG tablet, Take 1 tablet (20 mg total) by mouth 2 (two) times daily., Disp: , Rfl:  .  pyridOXINE (VITAMIN B-6) 25 MG tablet, Take 1 tablet (25 mg total) by mouth 3 (three) times daily as needed (nausea)., Disp: , Rfl:  .  rivaroxaban (XARELTO) 20 MG TABS tablet, Take 1 tablet (20 mg total) by mouth daily with supper., Disp: 30 tablet, Rfl: 3 .  traMADol (ULTRAM) 50 MG tablet, Take 1 tablet (50 mg total) by mouth every 12 (twelve) hours as needed., Disp: 40 tablet, Rfl: 0   Observations/Objective: Temperature (!) 97.5 F (36.4 C), temperature source Temporal, height _0  (1.905 m), weight 210 lb 8 oz (95.5 kg).   BP Readings from Last 3 Encounters:  08/28/19 136/72  08/15/19 (!) 153/82  08/14/19 (!) 148/88    Physical Exam Constitutional:      Appearance: He is well-developed.  HENT:     Head: Normocephalic.     Right Ear: Hearing normal.     Left Ear: Hearing normal.     Nose: Nose normal.     Mouth/Throat:     Mouth: Mucous membranes are moist.     Dentition: No dental caries.     Tongue: Lesions present.     Palate: No mass and lesions.     Pharynx: No pharyngeal swelling, oropharyngeal exudate, posterior oropharyngeal erythema or uvula swelling.     Tonsils: No tonsillar exudate or tonsillar abscesses.     Comments: Has enlarged taste buds on posterior tounge    Neck:     Thyroid: No thyroid mass or thyromegaly.     Vascular: No carotid bruit.     Trachea: Trachea normal.  Cardiovascular:     Rate and Rhythm: Normal rate and regular rhythm.     Pulses: Normal pulses.     Heart sounds: Heart sounds not distant. No murmur. No friction rub. No gallop.      Comments: No peripheral edema Pulmonary:     Effort: Pulmonary effort is normal. No respiratory distress.     Breath sounds: Normal breath sounds.  Lymphadenopathy:     Head:     Right side of head: No submental,  submandibular, tonsillar, preauricular, posterior auricular or occipital adenopathy.     Left side of head: No submental, submandibular, tonsillar, preauricular, posterior auricular or occipital adenopathy.     Cervical: No cervical adenopathy.     Upper Body:     Right upper body: No supraclavicular adenopathy.     Left upper body: No supraclavicular adenopathy.  Skin:    General: Skin is warm and dry.     Findings: No rash.  Psychiatric:  Speech: Speech normal.        Behavior: Behavior normal.        Thought Content: Thought content normal.      Assessment and Plan   Sore throat Very unclear what [facility] was treating with antibiotics and prednisone... per pt told he had thrush, but note does not refect this.  Strep culture ultimately returned negative.. doubt bacterial infection.  More liekly viral infection vs allergies vs thrush.  Has improved significantly ( due to time likely as opposed to antibiotics) which would be consistent with viral infeciton.  He has been on antibiotics and steroids.. no visible abnormality in throat... but will cover with nystatin for possible thrush in pharynx.  Otherwise symptomatic care.   follow up if not continuing to improve.. especially with fatigue.     Eliezer Lofts, MD

## 2019-08-29 ENCOUNTER — Other Ambulatory Visit: Payer: Self-pay | Admitting: Cardiology

## 2019-08-29 DIAGNOSIS — I10 Essential (primary) hypertension: Secondary | ICD-10-CM

## 2019-08-29 DIAGNOSIS — I480 Paroxysmal atrial fibrillation: Secondary | ICD-10-CM

## 2019-09-08 NOTE — Progress Notes (Signed)
Primary Physician/Referring:  Tonia Ghent, MD  Patient ID: [name], male    DOB: 1945-07-03, 74 y.o.   MRN: [id number]  Chief Complaint  Patient presents with  . Atrial Fibrillation  . Hypertension  . Follow-up    3 month   HPI:    BOHDEN DUNG  is a 74 y.o. 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 fib with RVR. Echocardiogram revealed normal LVEF.  No wall motion abnormalities.   He was discharged from ED on flecainide and propanolol as well as Xarelto for anticoagulation, he converted to sinus rhythm upon discharge. Patient had previously had A fib in Dec 2020 after back surgery and spontaneously converted to sinus.   He has not had any recurrence of atrial fibrillation.  He is presently tolerating diltiazem 180 mg daily along with propranolol 20 mg p.o. twice daily.  Symptoms of bowel and bladder disturbances since back surgery in December 2020 has resolved.  He feels the best he has in quite a while.  He is wondering if he can come off of anticoagulation.   Past Medical History:  Diagnosis Date  . Arthritis   . GERD (gastroesophageal reflux disease)   . H/O urinary frequency   . Heart murmur    as a child  . History of bronchitis   . HSV infection    oral  . Hypertension    improved after back pain treated with surgery  . Insomnia   . Joint pain   . OSA on CPAP    he ordered kit and started use 2017 w/o formal testing, see note from 11/30/15.   . Parkinson disease ([place])   . Vertigo 10/13/2014   recurrent; trigger is cool air on left ear, improved with daily antihistamine   Past Surgical History:  Procedure Laterality Date  . ACHILLES TENDON REPAIR Left   . BACK SURGERY    . COLONOSCOPY  02/2011   Negative,[facility]  . DEBRIDEMENT AND CLOSURE WOUND Left 02/25/2018   Procedure: DEBRIDEMENT LEFT LEG APPLICATION INTEGRA;   Surgeon: Irene Limbo, MD;  Location: [facility];  Service: Plastics;  Laterality: Left;  . EYE SURGERY Bilateral    cataracts  . KNEE ARTHROSCOPY     L  . LUMBAR FUSION     2016  . LUMBAR SPINE SURGERY  03/31/2019   exploration of lumbar fusion w/L5-S1 decompression redo L4-5 fusion  . SPINE SURGERY  2003   cervical fusion  . TONSILLECTOMY    . varicoelectomy      Family History  Problem Relation Age of Onset  . Hypertension Mother   . Asthma Mother   . Diabetes Father   . Stroke Father   . Heart attack Paternal Grandfather        >55  . Cancer Sister   . Colon cancer Neg Hx   . Prostate cancer Neg Hx     Social History   Tobacco Use  . Smoking status: Never Smoker  . Smokeless tobacco: Never Used  Substance Use Topics  . Alcohol use: No   Marital Status: Married ROS  Review of Systems  Constitution: Negative for weight gain.  Cardiovascular: Negative for dyspnea on exertion, leg swelling and syncope.  Respiratory: Negative for hemoptysis.   Endocrine: Negative for cold intolerance.  Hematologic/Lymphatic: Does not bruise/bleed easily.  Musculoskeletal: Positive for arthritis and back pain.  Gastrointestinal:  Negative for hematochezia and melena.  Neurological: Positive for tremors. Negative for headaches and light-headedness.   Objective  Blood pressure 140/82, pulse 64, resp. rate 14, height 6' 3" (1.905 m), weight 209 lb (94.8 kg), SpO2 95 %.  Vitals with BMI 09/10/2019 09/10/2019 08/28/2019  Height - 6' 3" 6' 3"  Weight - 209 lbs 210 lbs 8 oz  BMI - 94.50 38.88  Systolic 280 034 917  Diastolic 82 88 72  Pulse - 64 59     Physical Exam  Neck: No thyromegaly present.  Cardiovascular: Normal rate, regular rhythm, normal heart sounds and intact distal pulses. Exam reveals no gallop.  No murmur heard. No leg edema, no JVD.  Pulmonary/Chest: Effort normal and breath sounds normal.  Abdominal: Soft. Bowel sounds are normal.    Musculoskeletal:     Cervical back: Neck supple.  Skin: Skin is warm and dry.   Laboratory examination:   Recent Labs    05/17/19 0022 05/17/19 0022 05/17/19 0615 05/17/19 1137 05/26/19 0941  NA 127*   < > 129* 129* 129*  K 3.9   < > 3.9 3.8 4.7  CL 95*   < > 97* 95* 95*  CO2 24   < > _0 GLUCOSE 103*   < > 103* 117* 91  BUN 10   < > _1 CREATININE 1.05   < > 0.97 1.03 1.37  CALCIUM 8.3*   < > 8.5* 8.3* 9.6  GFRNONAA >60  --  >60 >60  --   GFRAA >60  --  >60 >60  --    < > = values in this interval not displayed.   CrCl cannot be calculated (Patient's most recent lab result is older than the maximum 21 days allowed.).  CMP Latest Ref Rng & Units 05/26/2019 05/17/2019 05/17/2019  Glucose 70 - 99 mg/dL 91 117(H) 103(H)  BUN 6 - 23 mg/dL _2 Creatinine 0.40 - 1.50 mg/dL 1.37 1.03 0.97  Sodium 135 - 145 mEq/L 129(L) 129(L) 129(L)  Potassium 3.5 - 5.1 mEq/L 4.7 3.8 3.9  Chloride 96 - 112 mEq/L 95(L) 95(L) 97(L)  CO2 19 - 32 mEq/L _3 Calcium 8.4 - 10.5 mg/dL 9.6 8.3(L) 8.5(L)  Total Protein 6.0 - 8.3 g/dL - - -  Total Bilirubin 0.2 - 1.2 mg/dL - - -  Alkaline Phos 39 - 117 U/L - - -  AST 0 - 37 U/L - - -  ALT 0 - 53 U/L - - -   CBC Latest Ref Rng & Units 05/26/2019 05/17/2019 05/16/2019  WBC 4.0 - 10.5 K/uL 8.9 7.0 11.8(H)  Hemoglobin 13.0 - 17.0 g/dL 13.3 13.3 13.0  Hematocrit 39.0 - 52.0 % 39.1 36.4(L) 34.7(L)  Platelets 150.0 - 400.0 K/uL 335.0 197 215   Lipid Panel     Component Value Date/Time   CHOL 132 04/11/2017 0936   TRIG 111.0 04/11/2017 0936   HDL 30.40 (L) 04/11/2017 0936   CHOLHDL 4 04/11/2017 0936   VLDL 22.2 04/11/2017 0936   LDLCALC 79 04/11/2017 0936   HEMOGLOBIN A1C No results found for: HGBA1C, MPG TSH Recent Labs    01/14/19 0958  TSH 4.39    Medications and allergies   Allergies  Allergen Reactions  . Flecainide     Vomit   . Flexeril [Cyclobenzaprine]     Constipation  . Hctz [Hydrochlorothiazide]      hyponatremia  . Morphine And Related Hives  Hallucinations- can tolerate oxycodone.   . Promethazine     Would avoid due to Parkinsons  . Sinemet [Carbidopa-Levodopa]     intolerant     Current Outpatient Medications  Medication Instructions  . Cetirizine HCl (ALLERGY RELIEF) 10 MG CAPS 1 capsule, Oral, Daily PRN  . diazepam (VALIUM) 5 mg, Oral, Daily PRN  . diltiazem (CARDIZEM CD) 180 MG 24 hr capsule TAKE 1 CAPSULE BY MOUTH EVERY DAY  . lidocaine (XYLOCAINE) 2 % solution 15 mLs, Mouth/Throat, Every 4 hours PRN  . Menthol, Topical Analgesic, (BIOFREEZE EX) 1 application, Topical, Daily PRN  . Multiple Vitamin (MULTIVITAMIN ADULT PO) 2 capsules, Oral, Daily, RED VEIN BALI   . omeprazole (PRILOSEC) 20 mg, Oral, Daily PRN  . pramipexole (MIRAPEX) 0.5 mg, Oral, 3 times daily  . propranolol (INDERAL) 20 MG tablet TAKE 1 TABLET BY MOUTH THREE TIMES A DAY  . pyridOXINE (VITAMIN B-6) 25 mg, Oral, 3 times daily PRN  . traMADol (ULTRAM) 50 mg, Oral, Every 12 hours PRN   Radiology:   Chest x-ray portable 04/24/2019: Mediastinum and hilar structures normal. Heart size normal. Lungs are clear. No pleural effusion or pneumothorax. Prior cervical spine fusion. Degenerative change thoracic spine. IMPRESSION: No acute cardiopulmonary disease.  Cardiac Studies:   Echocardiogram 04/25/2019:  Normal LV systolic function, EF 60 to 65%.  No other significant valvular abnormalities.  Essentially normal echocardiogram.  Left atrial size is normal.   EKG:  05/09/2019: Sinus bradycardia at rate of 56 bpm, biatrial enlargement, normal axis. Right bundle branch block.  04/24/2019: Atrial flutter with 2: 1 conduction at the rate of 147 bpm. Rightward axis. Right bundle branch block. No evidence of ischemia. Compared to the study done on 03/27/2019, sinus rhythm is now replaced by atrial flutter.  EKG 04/24/2019: A. Fibrillation with rapid response rate 147/minute, normal axis, no acute  ischemia.  Assessment     ICD-10-CM   1. Paroxysmal atrial fibrillation ([place]). CH22DS2-VASc Score is 2.  Yearly risk of stroke: 2.3% (A, HTN).  I48.0   2. Essential hypertension  I10      No orders of the defined types were placed in this encounter.   Medications Discontinued During This Encounter  Medication Reason  . benzonatate (TESSALON) 200 MG capsule Completed Course  . doxycycline (VIBRAMYCIN) 100 MG capsule Completed Course  . nystatin (MYCOSTATIN) 100000 UNIT/ML suspension Patient Preference  . rivaroxaban (XARELTO) 20 MG TABS tablet Patient Preference          Recommendations:   RODRIC PUNCH  is a 74 y.o. 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 fib with RVR. Echocardiogram revealed normal LVEF.  No wall motion abnormalities.   He was discharged from ED on flecainide and propanolol as well as Xarelto for anticoagulation, he converted to sinus rhythm upon discharge. Patient had previously had A fib in Dec 2020 after back surgery and spontaneously converted to sinus.   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 twice daily and heart rate is well controlled, although blood pressure was slightly elevated today, home blood pressure recordings have been less than 833 mmHg systolic.  Hence I did not make any changes to his medications.  We had a shared decision making with regard to long-term anticoagulation.  As his cardioembolic risk is less than 3, he prefers not  to be on long-term anticoagulation which have discontinued for now.  If he has recurrence of atrial fibrillation, we could consider ablation versus trying a different antiarrhythmic agent, he did not tolerate flecainide due to marked fatigue.  I will see him back in a year or sooner if there is a  problem.  Adrian Prows, MD, [facility] 09/10/2019, 1:00 PM [facility] Pager: [phone] Office: [phone]

## 2019-09-10 ENCOUNTER — Encounter: Payer: Self-pay | Admitting: Cardiology

## 2019-09-10 ENCOUNTER — Other Ambulatory Visit: Payer: Self-pay

## 2019-09-10 ENCOUNTER — Ambulatory Visit: Payer: PPO | Admitting: Cardiology

## 2019-09-10 VITALS — BP 140/82 | HR 64 | Resp 14 | Ht 75.0 in | Wt 209.0 lb

## 2019-09-10 DIAGNOSIS — I480 Paroxysmal atrial fibrillation: Secondary | ICD-10-CM

## 2019-09-10 DIAGNOSIS — I10 Essential (primary) hypertension: Secondary | ICD-10-CM

## 2019-09-16 DIAGNOSIS — D225 Melanocytic nevi of trunk: Secondary | ICD-10-CM | POA: Diagnosis not present

## 2019-09-16 DIAGNOSIS — X32XXXD Exposure to sunlight, subsequent encounter: Secondary | ICD-10-CM | POA: Diagnosis not present

## 2019-09-16 DIAGNOSIS — L570 Actinic keratosis: Secondary | ICD-10-CM | POA: Diagnosis not present

## 2019-09-16 DIAGNOSIS — L821 Other seborrheic keratosis: Secondary | ICD-10-CM | POA: Diagnosis not present

## 2019-09-23 ENCOUNTER — Other Ambulatory Visit: Payer: Self-pay | Admitting: Cardiology

## 2019-09-23 DIAGNOSIS — I10 Essential (primary) hypertension: Secondary | ICD-10-CM

## 2019-09-23 DIAGNOSIS — I480 Paroxysmal atrial fibrillation: Secondary | ICD-10-CM

## 2019-09-24 ENCOUNTER — Telehealth: Payer: Self-pay

## 2019-09-24 DIAGNOSIS — Z1211 Encounter for screening for malignant neoplasm of colon: Secondary | ICD-10-CM

## 2019-09-24 NOTE — Telephone Encounter (Signed)
Pt requesting referral for GI specialist at [facility] with Dr. Dawayne Cirri in [address]. Pt also requests the date of his last colonoscopy.  Sent a Comptroller with date of last colonoscopy

## 2019-09-24 NOTE — Telephone Encounter (Signed)
I put in the referral.  Thanks.  

## 2019-10-03 NOTE — H&P (Signed)
[facility] History & Physical  Tyler Blazing MD  [name] Admit date: 04/24/2019    Chief Complaint: Lightheadedness HPI: The patient is a 74 y.o. year-old with hx of HTN, DJD presented w/ presyncope to ED.  In ED had HR 150's, no fever, RR 16, BP's up. Hx HTN on norvasc.  Pt just had back surgery recently.  Hx of afib in the past one time after a different surgery.  Hx Parkinson's. Hx chronic pain on oxycodone which he stopped recently a few days ago.  Initial EKG not able to tell SVT vs afib.  In ED pt rec'd '1mg'$  IV dilaudid for poss withdrawal and HR improved briefly then increased again and repeat EKG reportedly looked more like atrial fib so now is to be getting IV diltiazem bolus and drip. Asked to see for admission.   Pt seen in ED, he is feeling much better now w/ HR's down to 90's from 150's. Pt states he had back surg L5-S1 about 3 wks ago, taking pain pills postop became very constipated and did not have a BM for 2-3 wks, so he stopped his narcotics 2 days ago and took Senna 2 at night and finally had a BM yesterday.  Today he felt lightheaded, no palpitation or CP, did have SOB but not severe.  No hx afib, MI, CP, heart problems.  Recent hx of parkinson's w/ RUE tremors started about 6 mos ago.    Pt is active, likes to go to the beach w/ his wife and they ride ebikes up to 40 miles up and down the beach.  No tob.      ROS  denies CP  no joint pain   no HA  no blurry vision  no rash  no diarrhea  no nausea/ vomiting  no dysuria  no difficulty voiding  no change in urine color     ROS  denies CP  no joint pain   no HA  no blurry vision  no rash  no diarrhea  no nausea/ vomiting  no dysuria  no difficulty voiding  no change in urine color   Past Medical History  Past Medical History:  Diagnosis Date  . Arthritis   . GERD (gastroesophageal reflux disease)   . H/O urinary frequency   . Heart murmur    as a child  . History of  bronchitis   . HSV infection    oral  . Hypertension    improved after back pain treated with surgery  . Insomnia   . Joint pain   . OSA on CPAP    he ordered kit and started use 2017 w/o formal testing, see note from 11/30/15.   . Parkinson disease ([place])   . Vertigo 10/13/2014   recurrent; trigger is cool air on left ear, improved with daily antihistamine   Past Surgical History  Past Surgical History:  Procedure Laterality Date  . ACHILLES TENDON REPAIR Left   . BACK SURGERY    . COLONOSCOPY  02/2011   Negative,[facility]  . DEBRIDEMENT AND CLOSURE WOUND Left 02/25/2018   Procedure: DEBRIDEMENT LEFT LEG APPLICATION INTEGRA;  Surgeon: Irene Limbo, MD;  Location: [facility];  Service: Plastics;  Laterality: Left;  . EYE SURGERY Bilateral    cataracts  . KNEE ARTHROSCOPY     L  . LUMBAR FUSION     2016  . LUMBAR SPINE SURGERY  03/31/2019   exploration of lumbar fusion w/L5-S1 decompression  redo L4-5 fusion  . SPINE SURGERY  2003   cervical fusion  . TONSILLECTOMY    . varicoelectomy     Family History  Family History  Problem Relation Age of Onset  . Hypertension Mother   . Asthma Mother   . Diabetes Father   . Stroke Father   . Heart attack Paternal Grandfather        >55  . Cancer Sister   . Colon cancer Neg Hx   . Prostate cancer Neg Hx    Social History  reports that he has never smoked. He has never used smokeless tobacco. He reports that he does not drink alcohol and does not use drugs. Allergies  Allergies  Allergen Reactions  . Flecainide     Vomit   . Flexeril [Cyclobenzaprine]     Constipation  . Hctz [Hydrochlorothiazide]     hyponatremia  . Morphine And Related Hives    Hallucinations- can tolerate oxycodone.   . Promethazine     Would avoid due to Parkinsons  . Sinemet [Carbidopa-Levodopa]     intolerant   Home medications Prior to Admission medications   Medication Sig Start Date End Date Taking? Authorizing  Provider  Cetirizine HCl (ALLERGY RELIEF) 10 MG CAPS Take 1 capsule by mouth daily as needed.    [provider]  diazepam (VALIUM) 5 MG tablet Take 5 mg by mouth daily as needed for anxiety.    [provider]  diltiazem (CARDIZEM CD) 180 MG 24 hr capsule TAKE 1 CAPSULE BY MOUTH EVERY DAY 08/05/19   Adrian Prows, MD  lidocaine (XYLOCAINE) 2 % solution Use as directed 15 mLs in the mouth or throat every 4 (four) hours as needed for mouth pain. Patient not taking: Reported on 09/10/2019 08/15/19   Faustino Congress, NP  Menthol, Topical Analgesic, (BIOFREEZE EX) Apply 1 application topically daily as needed (pain).    [provider]  Multiple Vitamin (MULTIVITAMIN ADULT PO) Take 2 capsules by mouth daily. RED VEIN BALI    [provider]  omeprazole (PRILOSEC) 20 MG capsule Take 20 mg by mouth daily as needed (heart burn).    [provider]  pramipexole (MIRAPEX) 0.5 MG tablet Take 0.5 mg by mouth 3 (three) times daily.    [provider]  propranolol (INDERAL) 20 MG tablet TAKE 1 TABLET BY MOUTH THREE TIMES A DAY 09/23/19   Adrian Prows, MD  pyridOXINE (VITAMIN B-6) 25 MG tablet Take 1 tablet (25 mg total) by mouth 3 (three) times daily as needed (nausea). Patient not taking: Reported on 09/10/2019 05/15/19   Tonia Ghent, MD  traMADol (ULTRAM) 50 MG tablet Take 1 tablet (50 mg total) by mouth every 12 (twelve) hours as needed. Patient not taking: Reported on 09/10/2019 07/30/19   Tonia Ghent, MD       Exam Gen alert, no distress No rash, cyanosis or gangrene Sclera anicteric, throat clear  No jvd or bruits Chest clear bilat RRR no MRG  Abd soft ntnd no mass or ascites +bs GU normal male MS no joint effusions or deformity Ext no LE edema, no wounds or ulcers Neuro is alert, Ox 3 , nf , R arm has tremors    Home meds:  - amlodipine 5 qd  - oxycodone IR tid prn/ tramadol 50 qid prn  - prn's/ vitamins/ supplements     Na 133  K 4  BUN 10  Cr 1.23  Ca 9.8  WBC 9k  Hb  15  plt 251   Trop 5,  CXR no acute disease     EKG #1 Undetermined rhythm, Rightward axis, Incomplete right bundle branch block, Nonspecific ST abnormality Abnormal ECG    EKG #2  Wide QRS tachycardia, Right bundle branch block, Possible Lateral and inferior infarct age undetermined Abnormal ECG  Assessment/ Plan: 1. New onset atrial fib w/ RVR - no hx CAD or other heart problems. Started on IV diltiazem here, now appears to be in NSR at 90's.  BP's better. Will admit and consulting cardiology to see and assist.  Ordered echo. Cont IV dilt.  2. HTN - cont amlodipine 3. Parkinson's - not on meds 4. Recent L-S back surgery - pain relief was very good, took himself off pain meds recently     Kelly Splinter, MD   [facility] 04/24/2019, Gen alert, no distress No rash, cyanosis or gangrene Sclera anicteric, throat clear  No jvd or bruits Chest clear bilat RRR no MRG  Abd soft ntnd no mass or ascites +bs GU normal male MS no joint effusions or deformity Ext no LE edema, no wounds or ulcers Neuro is alert, Ox 3 , nf , R arm has tremors    Home meds:  - amlodipine 5 qd  - oxycodone IR tid prn/ tramadol 50 qid prn  - prn's/ vitamins/ supplements     Na 133 K 4  BUN 10  Cr 1.23  Ca 9.8  WBC 9k  Hb 15  plt 251   Trop 5,  CXR no acute disease     EKG #1 Undetermined rhythm, Rightward axis, Incomplete right bundle branch block, Nonspecific ST abnormality Abnormal ECG    EKG #2  Wide QRS tachycardia, Right bundle branch block, Possible Lateral and inferior infarct age undetermined Abnormal ECG  Assessment/ Plan: 5. New onset atrial fib w/ RVR - no hx CAD or other heart problems. Started on IV diltiazem here, now appears to be in NSR at 90's.  BP's better. Will admit and consulting cardiology to see and assist.  Ordered echo. Cont IV dilt.  6. HTN - cont amlodipine 7. Parkinson's - not on meds 8. Recent L-S back surgery - pain relief was very good,  took himself off pain meds recently     Kelly Splinter, MD   [facility] 04/24/2019,  05:39 PM

## 2019-10-09 NOTE — Progress Notes (Signed)
Assessment/Plan:   1.  Parkinsons Disease, with possibility of levodopa resistant tremor  -Continue pramipexole, 0.5 mg 3 times daily  -We discussed that it used to be thought that levodopa would increase risk of melanoma but now it is believed that Parkinsons itself likely increases risk of melanoma. he is to get regular skin checks.  Sees Dr. Nevada Crane.  -discussed surgical interventions, if needed.  2.  Insomnia  -Primary care treating with Ambien but takes rarely  3.  Atrial fibrillation  -Following with Dr. Einar Gip.  -On Xarelto  4.  Constipation  -discussed nature and pathophysiology and association with PD  -discussed importance of hydration.  Pt is to increase water intake  -pt is given a copy of the rancho recipe  -recommended daily colace  -recommended miralax prn  5.  Hyponatremia  -pt states has been resolved and was caused by medication.   Pt states it has been rechecked and was normal.  I don't have copy but pt reassures it was done and feels much better than when it was low.  Subjective:   [name] was seen today in follow up for Parkinsons disease.  My previous records were reviewed prior to todays visit as well as outside records available to me.  He reports that he tolerates the medication well but tremor is the same.  Pt denies falls.  Pt denies lightheadedness, near syncope.  No hallucinations.  Mood has been good.  Medical records are reviewed.  Last saw Dr. Einar Gip for atrial fibrillation on May 26.    Current prescribed movement disorder medications: Pramipexole, 0.5 mg 3 times per day (started last visit)   PREVIOUS MEDICATIONS: Levodopa (patient thought he had chest pain and palpitations with it, but no longer there after was diagnosed with new onset A. fib)  ALLERGIES:   Allergies  Allergen Reactions  . Flecainide     Vomit   . Flexeril [Cyclobenzaprine]     Constipation  . Hctz [Hydrochlorothiazide]     hyponatremia  . Morphine And Related  Hives    Hallucinations- can tolerate oxycodone.   . Promethazine     Would avoid due to Parkinsons  . Sinemet [Carbidopa-Levodopa]     intolerant    CURRENT MEDICATIONS:  Outpatient Encounter Medications as of 10/13/2019  Medication Sig  . Cetirizine HCl (ALLERGY RELIEF) 10 MG CAPS Take 1 capsule by mouth daily as needed.  . diazepam (VALIUM) 5 MG tablet Take 5 mg by mouth daily as needed for anxiety.  Marland Kitchen diltiazem (CARDIZEM CD) 180 MG 24 hr capsule TAKE 1 CAPSULE BY MOUTH EVERY DAY  . lidocaine (XYLOCAINE) 2 % solution Use as directed 15 mLs in the mouth or throat every 4 (four) hours as needed for mouth pain.  . Menthol, Topical Analgesic, (BIOFREEZE EX) Apply 1 application topically daily as needed (pain).  . Multiple Vitamin (MULTIVITAMIN ADULT PO) Take 2 capsules by mouth daily. RED VEIN BALI  . omeprazole (PRILOSEC) 20 MG capsule Take 20 mg by mouth daily as needed (heart burn).  . pramipexole (MIRAPEX) 0.5 MG tablet Take 0.5 mg by mouth 3 (three) times daily.  . propranolol (INDERAL) 20 MG tablet TAKE 1 TABLET BY MOUTH THREE TIMES A DAY (Patient taking differently: Take 20 mg by mouth 2 (two) times daily. )  . pyridOXINE (VITAMIN B-6) 25 MG tablet Take 1 tablet (25 mg total) by mouth 3 (three) times daily as needed (nausea).  . traMADol (ULTRAM) 50 MG tablet Take 1 tablet (  50 mg total) by mouth every 12 (twelve) hours as needed.   No facility-administered encounter medications on file as of 10/13/2019.    Objective:   PHYSICAL EXAMINATION:    VITALS:   Vitals:   10/13/19 0833  BP: (!) 158/93  Pulse: 86  SpO2: 95%  Weight: 211 lb (95.7 kg)  Height: 6\' 3"  (1.905 m)    GEN:  The patient appears stated age and is in NAD. HEENT:  Normocephalic, atraumatic.  The mucous membranes are moist. The superficial temporal arteries are without ropiness or tenderness. CV:  RRR Lungs:  CTAB Neck/HEME:  There are no carotid bruits bilaterally.  Neurological  examination:  Orientation: The patient is alert and oriented x3. Cranial nerves: There is good facial symmetry with facial hypomimia. The speech is fluent and clear. Soft palate rises symmetrically and there is no tongue deviation. Hearing is intact to conversational tone. Sensation: Sensation is intact to light touch throughout Motor: Strength is at least antigravity x4.  Movement examination: Tone: There is normal tone in the UE/LE Abnormal movements: intermittent RUE rest tremor Coordination:  There is mild decremation with RAM's, with finger and toe taps on the right Gait and Station: The patient has no difficulty arising out of a deep-seated chair without the use of the hands. The patient's stride length is good with increased RUE rest tremor.    I have reviewed and interpreted the following labs independently    Chemistry      Component Value Date/Time   NA 129 (L) 05/26/2019 0941   K 4.7 05/26/2019 0941   CL 95 (L) 05/26/2019 0941   CO2 25 05/26/2019 0941   BUN 11 05/26/2019 0941   CREATININE 1.37 05/26/2019 0941   CREATININE 1.32 (H) 01/14/2019 0958      Component Value Date/Time   CALCIUM 9.6 05/26/2019 0941   ALKPHOS 77 05/15/2019 1013   AST 19 05/15/2019 1013   ALT 15 05/15/2019 1013   BILITOT 0.9 05/15/2019 1013       Lab Results  Component Value Date   WBC 8.9 05/26/2019   HGB 13.3 05/26/2019   HCT 39.1 05/26/2019   MCV 89.8 05/26/2019   PLT 335.0 05/26/2019    Lab Results  Component Value Date   TSH 4.39 01/14/2019     Total time spent on today's visit was 30 minutes, including both face-to-face time and nonface-to-face time.  Time included that spent on review of records (prior notes available to me/labs/imaging if pertinent), discussing treatment and goals, answering patient's questions and coordinating care.  Cc:  Tonia Ghent, MD

## 2019-10-13 ENCOUNTER — Ambulatory Visit: Payer: PPO | Admitting: Neurology

## 2019-10-13 ENCOUNTER — Other Ambulatory Visit: Payer: Self-pay

## 2019-10-13 ENCOUNTER — Encounter: Payer: Self-pay | Admitting: Neurology

## 2019-10-13 VITALS — BP 158/93 | HR 86 | Ht 75.0 in | Wt 211.0 lb

## 2019-10-13 DIAGNOSIS — G20 Parkinson's disease: Secondary | ICD-10-CM

## 2019-10-13 DIAGNOSIS — I4891 Unspecified atrial fibrillation: Secondary | ICD-10-CM | POA: Diagnosis not present

## 2019-10-13 NOTE — Patient Instructions (Signed)
A.   Constipation and Parkinson's disease:  1.Rancho recipe for constipation in Parkinsons Disease:  -1 cup of unprocessed bran (need to get this at [employer], [employer] or similar type of store), 2 cups of applesauce in 1 cup of prune juice 2.  Increase fiber intake (Metamucil,vegetables) 3.  Regular, moderate exercise can be beneficial. 4.  Avoid medications causing constipation, such as medications like antacids with calcium or magnesium 5.  It's okay to take daily Miralax, and taper if stools become too loose or you experience diarrhea 6.  Stool softeners (Colace) can help with chronic constipation and I recommend you take this daily. 7.  Increase water intake.  You should be drinking 1/2 gallon of water a day as long as you have not been diagnosed with congestive heart failure or renal/kidney failure.  This is probably the single greatest thing that you can do to help your constipation.  B.  You can try melatonin, 3mg  to 8mg , at bedtime for sleep.  The physicians and staff at [facility] are committed to providing excellent care. You may receive a survey requesting feedback about your experience at our office. We strive to receive "very good" responses to the survey questions. If you feel that your experience would prevent you from giving the office a "very good " response, please contact our office to try to remedy the situation. We may be reached at [phone]. Thank you for taking the time out of your busy day to complete the survey.

## 2019-10-16 DIAGNOSIS — M5417 Radiculopathy, lumbosacral region: Secondary | ICD-10-CM | POA: Diagnosis not present

## 2019-10-17 ENCOUNTER — Other Ambulatory Visit: Payer: Self-pay | Admitting: Cardiology

## 2019-10-17 DIAGNOSIS — I10 Essential (primary) hypertension: Secondary | ICD-10-CM

## 2019-10-17 DIAGNOSIS — I480 Paroxysmal atrial fibrillation: Secondary | ICD-10-CM

## 2019-10-17 MED ORDER — PROPRANOLOL HCL 20 MG PO TABS: 20.0000 mg | ORAL_TABLET | Freq: Two times a day (BID) | ORAL | 3 refills | Status: DC

## 2019-10-30 ENCOUNTER — Other Ambulatory Visit: Payer: Self-pay | Admitting: Neurology

## 2019-11-12 ENCOUNTER — Other Ambulatory Visit: Payer: Self-pay | Admitting: Cardiology

## 2019-11-12 ENCOUNTER — Encounter: Payer: Self-pay | Admitting: *Deleted

## 2019-11-12 ENCOUNTER — Telehealth: Payer: Self-pay | Admitting: *Deleted

## 2019-11-12 DIAGNOSIS — M4180 Other forms of scoliosis, site unspecified: Secondary | ICD-10-CM | POA: Diagnosis not present

## 2019-11-12 DIAGNOSIS — F5101 Primary insomnia: Secondary | ICD-10-CM

## 2019-11-12 DIAGNOSIS — G4700 Insomnia, unspecified: Secondary | ICD-10-CM | POA: Insufficient documentation

## 2019-11-12 DIAGNOSIS — Z981 Arthrodesis status: Secondary | ICD-10-CM | POA: Diagnosis not present

## 2019-11-12 DIAGNOSIS — M5417 Radiculopathy, lumbosacral region: Secondary | ICD-10-CM | POA: Diagnosis not present

## 2019-11-12 MED ORDER — ZOLPIDEM TARTRATE 5 MG PO TABS: 5.0000 mg | ORAL_TABLET | Freq: Every evening | ORAL | 1 refills | Status: DC | PRN

## 2019-11-12 NOTE — Telephone Encounter (Signed)
Rx sent.  Let me know if is not effective.  Med list updated.  Thanks.

## 2019-11-12 NOTE — Telephone Encounter (Signed)
Patient called stating that he has been taking Ambien and has only 3 pills left. Patient stated that he only takes it as needed and it was last filled 05/03/19. Patient stated that he has been taking Melatonin but that gives him a hangover feeling the day after. Patient stated that Ambien was originally prescribed by Dr. Einar Gip and he tried to get it refilled by him but was advised that the PCP would need to do this. Patient wants to know if Dr. Damita Dunnings will prescribe it or will he need to come in to be seen first. Medication is no longer on the med sheet. Pharmacy [facility]

## 2019-11-18 NOTE — Telephone Encounter (Signed)
Patient advised.

## 2019-11-19 ENCOUNTER — Ambulatory Visit (INDEPENDENT_AMBULATORY_CARE_PROVIDER_SITE_OTHER): Payer: PPO | Admitting: Primary Care

## 2019-11-19 ENCOUNTER — Ambulatory Visit (INDEPENDENT_AMBULATORY_CARE_PROVIDER_SITE_OTHER)
Admission: RE | Admit: 2019-11-19 | Discharge: 2019-11-19 | Disposition: A | Discharge status: Home / Self Care | Payer: PPO | Source: Ambulatory Visit | Attending: Primary Care | Admitting: Primary Care

## 2019-11-19 ENCOUNTER — Other Ambulatory Visit: Payer: Self-pay

## 2019-11-19 ENCOUNTER — Encounter: Payer: Self-pay | Admitting: Primary Care

## 2019-11-19 VITALS — BP 140/86 | HR 82 | Temp 96.4°F | Ht 75.0 in | Wt 210.5 lb

## 2019-11-19 DIAGNOSIS — E871 Hypo-osmolality and hyponatremia: Secondary | ICD-10-CM | POA: Diagnosis not present

## 2019-11-19 DIAGNOSIS — K5900 Constipation, unspecified: Secondary | ICD-10-CM

## 2019-11-19 LAB — BASIC METABOLIC PANEL
BUN: 12 mg/dL (ref 6–23)
CO2: 26 mEq/L (ref 19–32)
Calcium: 9.3 mg/dL (ref 8.4–10.5)
Chloride: 101 mEq/L (ref 96–112)
Creatinine, Ser: 1.26 mg/dL (ref 0.40–1.50)
GFR: 56.03 mL/min — ABNORMAL LOW (ref >60.00)
Glucose, Bld: 88 mg/dL (ref 70–99)
Potassium: 4.1 mEq/L (ref 3.5–5.1)
Sodium: 136 mEq/L (ref 135–145)

## 2019-11-19 NOTE — Assessment & Plan Note (Signed)
Symptoms today representative of constipation. No alarm signs. Bowel sounds noted throughout.   Check abdominal plain films today.  Discussed to avoid laxatives given electrolyte imbalance history.  Start with docusate sodium 100 mg once to twice daily. Increase fiber intake. Handout provided.  Follow up with PCP.

## 2019-11-19 NOTE — Patient Instructions (Signed)
Complete xray(s) and labs prior to leaving today. I will notify you of your results once received.  Start docusate sodium (Colace) 100 mg one daily for constipation. You may increase this to twice daily if needed. Avoid laxatives for now.  Increase dietary intake of fiber.   It was a pleasure meeting you!   High-Fiber Diet Fiber, also called dietary fiber, is a type of carbohydrate that is found in fruits, vegetables, whole grains, and beans. A high-fiber diet can have many health benefits. Your health care provider may recommend a high-fiber diet to help:  Prevent constipation. Fiber can make your bowel movements more regular.  Lower your cholesterol.  Relieve the following conditions: ? Swelling of veins in the anus (hemorrhoids). ? Swelling and irritation (inflammation) of specific areas of the digestive tract (uncomplicated diverticulosis). ? A problem of the large intestine (colon) that sometimes causes pain and diarrhea (irritable bowel syndrome, IBS).  Prevent overeating as part of a weight-loss plan.  Prevent heart disease, type 2 diabetes, and certain cancers. What is my plan? The recommended daily fiber intake in grams (g) includes:  38 g for men age 82 or younger.  30 g for men over age 90.  37 g for women age 31 or younger.  21 g for women over age 53. You can get the recommended daily intake of dietary fiber by:  Eating a variety of fruits, vegetables, grains, and beans.  Taking a fiber supplement, if it is not possible to get enough fiber through your diet. What do I need to know about a high-fiber diet?  It is better to get fiber through food sources rather than from fiber supplements. There is not a lot of research about how effective supplements are.  Always check the fiber content on the nutrition facts label of any prepackaged food. Look for foods that contain 5 g of fiber or more per serving.  Talk with a diet and nutrition specialist (dietitian) if  you have questions about specific foods that are recommended or not recommended for your medical condition, especially if those foods are not listed below.  Gradually increase how much fiber you consume. If you increase your intake of dietary fiber too quickly, you may have bloating, cramping, or gas.  Drink plenty of water. Water helps you to digest fiber. What are tips for following this plan?  Eat a wide variety of high-fiber foods.  Make sure that half of the grains that you eat each day are whole grains.  Eat breads and cereals that are made with whole-grain flour instead of refined flour or white flour.  Eat brown rice, bulgur wheat, or millet instead of white rice.  Start the day with a breakfast that is high in fiber, such as a cereal that contains 5 g of fiber or more per serving.  Use beans in place of meat in soups, salads, and pasta dishes.  Eat high-fiber snacks, such as berries, raw vegetables, nuts, and popcorn.  Choose whole fruits and vegetables instead of processed forms like juice or sauce. What foods can I eat?  Fruits Berries. Pears. Apples. Oranges. Avocado. Prunes and raisins. Dried figs. Vegetables Sweet potatoes. Spinach. Kale. Artichokes. Cabbage. Broccoli. Cauliflower. Green peas. Carrots. Squash. Grains Whole-grain breads. Multigrain cereal. Oats and oatmeal. Brown rice. Barley. Bulgur wheat. [place]. Quinoa. Bran muffins. Popcorn. Rye wafer crackers. Meats and other proteins Navy, kidney, and pinto beans. Soybeans. Split peas. Lentils. Nuts and seeds. Dairy Fiber-fortified yogurt. Beverages Fiber-fortified soy milk. Fiber-fortified orange  juice. Other foods Fiber bars. The items listed above may not be a complete list of recommended foods and beverages. Contact a dietitian for more options. What foods are not recommended? Fruits Fruit juice. Cooked, strained fruit. Vegetables Fried potatoes. Canned vegetables. Well-cooked  vegetables. Grains White bread. Pasta made with refined flour. White rice. Meats and other proteins Fatty cuts of meat. Fried chicken or fried fish. Dairy Milk. Yogurt. Cream cheese. Sour cream. Fats and oils Butters. Beverages Soft drinks. Other foods Cakes and pastries. The items listed above may not be a complete list of foods and beverages to avoid. Contact a dietitian for more information. Summary  Fiber is a type of carbohydrate. It is found in fruits, vegetables, whole grains, and beans.  There are many health benefits of eating a high-fiber diet, such as preventing constipation, lowering blood cholesterol, helping with weight loss, and reducing your risk of heart disease, diabetes, and certain cancers.  Gradually increase your intake of fiber. Increasing too fast can result in cramping, bloating, and gas. Drink plenty of water while you increase your fiber.  The best sources of fiber include whole fruits and vegetables, whole grains, nuts, seeds, and beans. This information is not intended to replace advice given to you by your health care provider. Make sure you discuss any questions you have with your health care provider. Document Revised: 02/05/2017 Document Reviewed: 02/05/2017 Elsevier Patient Education  2020 [employer].

## 2019-11-19 NOTE — Assessment & Plan Note (Signed)
Unclear etiology at this point, repeat BMP pending. Advised to avoid laxatives for constipation, will trial docusate sodium (Colace).

## 2019-11-19 NOTE — Progress Notes (Signed)
Subjective:    Patient ID: [name], male    DOB: [birth year], 74 y.o.   MRN: [id number]  HPI  This visit occurred during the SARS-CoV-2 public health emergency.  Safety protocols were in place, including screening questions prior to the visit, additional usage of staff PPE, and extensive cleaning of exam room while observing appropriate contact time as indicated for disinfecting solutions.   [name] is a 74 year old male patient of Dr. Damita Dunnings, with a medical history of hypertension, atrial fibrillation, OSA, GERD, dementia due to Parkinson's Disease, chronic back pain, constipation who presents today with a chief complaint of abdominal pain.  His pain is located to the generalized abdomen. He also reports bloating, nausea. Symptoms began about four days ago. Last normal bowel movement was one week ago. He typically has bowel movements once daily.  He is passing some gas.   He had his wife provide him an enema yesterday, no result. He then took Senna after, and six hours later he had straight liquid stool, nothing formed. He did purchase senna with stool softener yesterday, has not taken any today.   He underwent CT of the abdomen/pelvis in January 2021 which revealed large stool burden throughout the entire colon, but otherwise negative for bowel obstruction or other acute GI issues.   He is managed on Tramadol and Ambien for which he takes infrequently.   Review of Systems  Constitutional: Negative for fever.  Gastrointestinal: Positive for abdominal pain, constipation and nausea. Negative for blood in stool, diarrhea and vomiting.  Neurological: Negative for dizziness.       Past Medical History:  Diagnosis Date  . Arthritis   . GERD (gastroesophageal reflux disease)   . H/O urinary frequency   . Heart murmur    as a child  . History of bronchitis   . HSV infection    oral  . Hypertension    improved after back pain treated with surgery  . Insomnia   . Insomnia     Improved with Ambien, did not tolerate melatonin.  . Joint pain   . OSA on CPAP    he ordered kit and started use 2017 w/o formal testing, see note from 11/30/15.   . Parkinson disease (Kevin)   . Vertigo 10/13/2014   recurrent; trigger is cool air on left ear, improved with daily antihistamine     Social History   Socioeconomic History  . Marital status: Married    Spouse name: Not on file  . Number of children: 2  . Years of education: 108  . Highest education level: Not on file  Occupational History  . Occupation: retired  Tobacco Use  . Smoking status: Never Smoker  . Smokeless tobacco: Never Used  Vaping Use  . Vaping Use: Never used  Substance and Sexual Activity  . Alcohol use: No  . Drug use: No  . Sexual activity: Not on file  Other Topics Concern  . 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     Social Determinants of Health   Financial Resource Strain:   . Difficulty of Paying Living Expenses:   Food Insecurity:   . Worried About Charity fundraiser in the Last  Year:   . Ran Out of Food in the Last Year:   Transportation Needs:   . Film/video editor (Medical):   Marland Kitchen Lack of Transportation (Non-Medical):   Physical Activity:   . Days of Exercise per Week:   . Minutes of Exercise per Session:   Stress:   . Feeling of Stress :   Social Connections:   . Frequency of Communication with Friends and Family:   . Frequency of Social Gatherings with Friends and Family:   . Attends Religious Services:   . Active Member of Clubs or Organizations:   . Attends Archivist Meetings:   Marland Kitchen Marital Status:   Intimate Partner Violence:   . Fear of Current or Ex-Partner:   . Emotionally Abused:   Marland Kitchen Physically Abused:   .  Sexually Abused:     Past Surgical History:  Procedure Laterality Date  . ACHILLES TENDON REPAIR Left   . BACK SURGERY    . COLONOSCOPY  02/2011   Negative,[facility]  . DEBRIDEMENT AND CLOSURE WOUND Left 02/25/2018   Procedure: DEBRIDEMENT LEFT LEG APPLICATION INTEGRA;  Surgeon: Irene Limbo, MD;  Location: [facility];  Service: Plastics;  Laterality: Left;  . EYE SURGERY Bilateral    cataracts  . KNEE ARTHROSCOPY     L  . LUMBAR FUSION     2016  . LUMBAR SPINE SURGERY  03/31/2019   exploration of lumbar fusion w/L5-S1 decompression redo L4-5 fusion  . SPINE SURGERY  2003   cervical fusion  . TONSILLECTOMY    . varicoelectomy      Family History  Problem Relation Age of Onset  . Hypertension Mother   . Asthma Mother   . Diabetes Father   . Stroke Father   . Heart attack Paternal Grandfather        >55  . Cancer Sister   . Colon cancer Neg Hx   . Prostate cancer Neg Hx     Allergies  Allergen Reactions  . Flecainide     Vomit   . Flexeril [Cyclobenzaprine]     Constipation  . Hctz [Hydrochlorothiazide]     hyponatremia  . Morphine And Related Hives    Hallucinations- can tolerate oxycodone.   . Promethazine     Would avoid due to Parkinsons  . Sinemet [Carbidopa-Levodopa]     intolerant    Current Outpatient Medications on File Prior to Visit  Medication Sig Dispense Refill  . Cetirizine HCl (ALLERGY RELIEF) 10 MG CAPS Take 1 capsule by mouth daily as needed.    . diazepam (VALIUM) 5 MG tablet Take 5 mg by mouth daily as needed for anxiety.    Marland Kitchen diltiazem (CARDIZEM CD) 180 MG 24 hr capsule TAKE 1 CAPSULE BY MOUTH EVERY DAY 90 capsule 0  . Menthol, Topical Analgesic, (BIOFREEZE EX) Apply 1 application topically daily as needed (pain).    . Multiple Vitamin (MULTIVITAMIN ADULT PO) Take 2 capsules by mouth daily. RED VEIN BALI    . omeprazole (PRILOSEC) 20 MG capsule Take 20 mg by mouth daily as needed (heart burn).    . pramipexole  (MIRAPEX) 0.5 MG tablet TAKE 1 TABLET (0.5 MG TOTAL) BY MOUTH 3 (THREE) TIMES DAILY. 270 tablet 1  . propranolol (INDERAL) 20 MG tablet Take 1 tablet (20 mg total) by mouth 2 (two) times daily. 180 tablet 3  . pyridOXINE (VITAMIN B-6) 25 MG tablet Take 1 tablet (25 mg total) by mouth 3 (three) times daily as  needed (nausea).    . traMADol (ULTRAM) 50 MG tablet Take 1 tablet (50 mg total) by mouth every 12 (twelve) hours as needed. 40 tablet 0  . zolpidem (AMBIEN) 5 MG tablet Take 1 tablet (5 mg total) by mouth at bedtime as needed for sleep. 30 tablet 1   No current facility-administered medications on file prior to visit.    BP 140/86   Pulse 82   Temp (!) 96.4 F (35.8 C) (Temporal)   Ht _0  (1.905 m)   Wt 210 lb 8 oz (95.5 kg)   SpO2 98%   BMI 26.31 kg/m    Objective:   Physical Exam Cardiovascular:     Rate and Rhythm: Normal rate and regular rhythm.  Pulmonary:     Effort: Pulmonary effort is normal.     Breath sounds: Normal breath sounds.  Abdominal:     Palpations: Abdomen is soft.     Tenderness: There is generalized abdominal tenderness. There is no guarding.     Comments: Slightly distended, soft.  Musculoskeletal:     Cervical back: Neck supple.  Skin:    General: Skin is warm and dry.            Assessment & Plan:

## 2019-11-26 DIAGNOSIS — K5900 Constipation, unspecified: Secondary | ICD-10-CM | POA: Diagnosis not present

## 2019-11-26 DIAGNOSIS — R194 Change in bowel habit: Secondary | ICD-10-CM | POA: Diagnosis not present

## 2019-12-04 ENCOUNTER — Other Ambulatory Visit: Payer: Self-pay | Admitting: Cardiology

## 2019-12-04 DIAGNOSIS — I480 Paroxysmal atrial fibrillation: Secondary | ICD-10-CM

## 2019-12-23 ENCOUNTER — Telehealth: Payer: Self-pay

## 2019-12-23 NOTE — Telephone Encounter (Signed)
Pt left v/m requesting refill for med for tickling in the throat; I spoke with pt and pt has no idea of the name of the med he is requesting. Pt said it is not the cetirizine; pt is already taking the allergy med. Pt said it is for a tickling in his throat due to drainage; pt does not have S/T. Pt said same med Dr Damita Dunnings always gives him for tickling in throat. [facility].

## 2019-12-24 MED ORDER — BENZONATATE 200 MG PO CAPS: 200.0000 mg | ORAL_CAPSULE | Freq: Three times a day (TID) | ORAL | 1 refills | Status: DC | PRN

## 2019-12-24 NOTE — Telephone Encounter (Signed)
This is limited information but I presume he is talking about tessalon and I sent the rx.  Thanks.

## 2020-01-19 DIAGNOSIS — Z1159 Encounter for screening for other viral diseases: Secondary | ICD-10-CM | POA: Diagnosis not present

## 2020-01-19 DIAGNOSIS — M5417 Radiculopathy, lumbosacral region: Secondary | ICD-10-CM | POA: Diagnosis not present

## 2020-01-22 DIAGNOSIS — R194 Change in bowel habit: Secondary | ICD-10-CM | POA: Diagnosis not present

## 2020-01-22 LAB — HM COLONOSCOPY

## 2020-01-23 DIAGNOSIS — M4316 Spondylolisthesis, lumbar region: Secondary | ICD-10-CM | POA: Diagnosis not present

## 2020-01-23 DIAGNOSIS — M542 Cervicalgia: Secondary | ICD-10-CM | POA: Diagnosis not present

## 2020-01-23 DIAGNOSIS — M5417 Radiculopathy, lumbosacral region: Secondary | ICD-10-CM | POA: Diagnosis not present

## 2020-01-26 ENCOUNTER — Encounter: Payer: Self-pay | Admitting: Family Medicine

## 2020-01-26 ENCOUNTER — Telehealth (INDEPENDENT_AMBULATORY_CARE_PROVIDER_SITE_OTHER): Payer: PPO | Admitting: Family Medicine

## 2020-01-26 VITALS — BP 146/89 | HR 64 | Temp 97.7°F

## 2020-01-26 DIAGNOSIS — R059 Cough, unspecified: Secondary | ICD-10-CM

## 2020-01-26 DIAGNOSIS — J301 Allergic rhinitis due to pollen: Secondary | ICD-10-CM | POA: Diagnosis not present

## 2020-01-26 NOTE — Progress Notes (Signed)
Virtual Visit via Video Note  I connected with [name] on 01/26/20 at  1:00 PM EDT by a video enabled telemedicine application and verified that I am speaking with the correct person using two identifiers.  Location: Patient: In his home Provider: [place] Persons participating in virtual visit: Patient, provider   I discussed the limitations of evaluation and management by telemedicine and the availability of in person appointments. The patient expressed understanding and agreed to proceed.  History of Present Illness: Chief Complaint  Patient presents with  . Cough    4-6 weeks when started. Medication prescribed by Dr Damita Dunnings not helping this year. Pt took covid test, results negative. Pt states cough is worse after he eats.  . nasal drainage   This is a 74 year old male who presents today for acute virtual visit for above chief complaint.  He reports that he has been having an intermittent cough.  It is worse in the morning and sometimes after eating.  He feels irritation and tickle at the base of his throat.  He has some nasal drainage which is thin and clear.  He denies any acid indigestion, heartburn, fever, shortness of breath or wheeze.  He has been taking cetirizine 10 mg daily and occasional nighttime diphenhydramine.  The cough can be triggered by cool air.  Does not seem to be triggered by talking.  He has fluticasone nasal spray which he uses occasionally but has not been using consistently recently.   Observations/Objective: Patient is alert and answers questions appropriately.  Visible skin is unremarkable.  He is normally conversive without increased work of breathing, respirations are even and unlabored, there is no audible wheeze, he had 1 witnessed dry cough during visit.  Mood and affect are appropriate. BP (!) 146/89   Pulse 64   Temp 97.7 F (36.5 C) (Temporal)   SpO2 95%  BP Readings from Last 3 Encounters:  01/26/20 (!) 146/89  11/19/19 140/86   10/13/19 (!) 158/93     Assessment and Plan: 1. Seasonal allergic rhinitis due to pollen -He has been on cetirizine for a while and I have recommended that he switch out his long-acting antihistamine for a couple of weeks, using either loratadine or fexofenadine -I think you would benefit from daily fluticasone nasal spray use for at least 7 to 10 days -Can also add saline nasal spray as needed -Follow-up precautions reviewed  2. Cough -Seems related to #1, he gets good relief with cough lozenges and can continue these as needed.  Hopefully with improved allergic rhinitis control he will have less cough.   Clarene Reamer, FNP-BC  [facility]  01/26/2020 1:25 PM   Follow Up Instructions: Recap and recommendations sent to patient via MyChart   I discussed the assessment and treatment plan with the patient. The patient was provided an opportunity to ask questions and all were answered. The patient agreed with the plan and demonstrated an understanding of the instructions.   The patient was advised to call back or seek an in-person evaluation if the symptoms worsen or if the condition fails to improve as anticipated.   Elby Beck, FNP

## 2020-02-12 ENCOUNTER — Encounter: Payer: Self-pay | Admitting: Family Medicine

## 2020-02-12 DIAGNOSIS — M5412 Radiculopathy, cervical region: Secondary | ICD-10-CM | POA: Diagnosis not present

## 2020-02-25 ENCOUNTER — Other Ambulatory Visit: Payer: Self-pay | Admitting: Cardiology

## 2020-02-25 DIAGNOSIS — I480 Paroxysmal atrial fibrillation: Secondary | ICD-10-CM

## 2020-02-28 DIAGNOSIS — J029 Acute pharyngitis, unspecified: Secondary | ICD-10-CM | POA: Diagnosis not present

## 2020-03-08 ENCOUNTER — Telehealth: Payer: Self-pay

## 2020-03-08 NOTE — Telephone Encounter (Signed)
I spoke with pt; pt was last PCR covid tested one month ago and that was negative. Pt was seen at [facility] and was advised did not need another covid test due to symptoms; now pt said he has soreness from lt ear down side of face to left side of throat but throat is not sore. No fever and no other respiratory or covid symptoms. Pt has not had covid vaccines. Pt said this has been going on for long time. Per Larene Beach RN and Dr Damita Dunnings with above info said pt can come into office; 03/09/20 appt changed to in office. Pt voiced understanding and appreciative. FYI to Dr Damita Dunnings.

## 2020-03-08 NOTE — Telephone Encounter (Signed)
Thanks

## 2020-03-08 NOTE — Telephone Encounter (Signed)
-----   Message from Tonia Ghent, MD sent at 03/08/2020  8:59 AM EST ----- Can/should this OV tomorrow at 4PM get changed to in person?  Given the duration, does it meet criteria to come in the office?  That may be more effective than video.  Just make sure he had a neg test and let me know your thoughts.  Thanks.   Brigitte Pulse

## 2020-03-09 ENCOUNTER — Other Ambulatory Visit: Payer: Self-pay

## 2020-03-09 ENCOUNTER — Encounter: Payer: Self-pay | Admitting: Family Medicine

## 2020-03-09 ENCOUNTER — Ambulatory Visit (INDEPENDENT_AMBULATORY_CARE_PROVIDER_SITE_OTHER): Payer: PPO | Admitting: Family Medicine

## 2020-03-09 VITALS — BP 124/74 | HR 74 | Temp 98.3°F | Ht 75.0 in | Wt 216.3 lb

## 2020-03-09 DIAGNOSIS — H6980 Other specified disorders of Eustachian tube, unspecified ear: Secondary | ICD-10-CM | POA: Diagnosis not present

## 2020-03-09 MED ORDER — FLUTICASONE PROPIONATE 50 MCG/ACT NA SUSP: 2.0000 | Freq: Every day | NASAL | Status: DC

## 2020-03-09 MED ORDER — IBUPROFEN 200 MG PO TABS: 200.0000 mg | ORAL_TABLET | Freq: Two times a day (BID) | ORAL | Status: DC | PRN

## 2020-03-09 NOTE — Progress Notes (Signed)
This visit occurred during the SARS-CoV-2 public health emergency.  Safety protocols were in place, including screening questions prior to the visit, additional usage of staff PPE, and extensive cleaning of exam room while observing appropriate contact time as indicated for disinfecting solutions.  Left ear pain, down to the L side of the throat.  No lesions seen in the mouth on inspection by the patient.  No FCNAVD.  No R ear sx.  No hearing changes.  Prev with some rhinorrhea and cough but those resolved weeks prior.   covid vaccine encouraged.  He has declined so far.  I advised him to get vaccinated.  He was asking about prn ibuprofen use.  This should be okay, assuming he follows routine NSAID cautions and those were discussed.  Meds, vitals, and allergies reviewed.   ROS: Per HPI unless specifically indicated in ROS section   nad ncat TM wnl B except for a scant amount of clear fluid noted behind the left tympanic membrane.  He does not have tympanic membrane movement on Valsalva maneuver. Nasal exam wnl  OP wnl Neck supple, no LA  rrr Hand tremor noted.

## 2020-03-09 NOTE — Patient Instructions (Addendum)
Temporarily double up on the flonase.  2 sprays per nostril twice per day.   Gently try to pop your ears.  I suspect this will gradually improve.  Take care.  Glad to see you. Update [place] as needed.

## 2020-03-10 DIAGNOSIS — H6990 Unspecified Eustachian tube disorder, unspecified ear: Secondary | ICD-10-CM | POA: Insufficient documentation

## 2020-03-10 DIAGNOSIS — H6980 Other specified disorders of Eustachian tube, unspecified ear: Secondary | ICD-10-CM | POA: Insufficient documentation

## 2020-03-10 NOTE — Assessment & Plan Note (Signed)
Likely eustachian tube dysfunction causing his symptoms.  He can double up on Flonase and use 2 sprays per nostril twice a day for few days, gently perform Valsalva maneuver, and then I would expect his symptoms to gradually improve and he can cut back to the routine dosing of Flonase.  Update me as needed in the meantime.  He agrees.

## 2020-03-14 ENCOUNTER — Other Ambulatory Visit: Payer: Self-pay | Admitting: Family Medicine

## 2020-03-14 DIAGNOSIS — I10 Essential (primary) hypertension: Secondary | ICD-10-CM

## 2020-03-23 ENCOUNTER — Other Ambulatory Visit (INDEPENDENT_AMBULATORY_CARE_PROVIDER_SITE_OTHER): Payer: PPO

## 2020-03-23 ENCOUNTER — Other Ambulatory Visit: Payer: Self-pay

## 2020-03-23 DIAGNOSIS — I10 Essential (primary) hypertension: Secondary | ICD-10-CM | POA: Diagnosis not present

## 2020-03-23 LAB — COMPREHENSIVE METABOLIC PANEL
ALT: 16 U/L (ref 0–53)
AST: 16 U/L (ref 0–37)
Albumin: 4.1 g/dL (ref 3.5–5.2)
Alkaline Phosphatase: 63 U/L (ref 39–117)
BUN: 14 mg/dL (ref 6–23)
CO2: 29 mEq/L (ref 19–32)
Calcium: 9.3 mg/dL (ref 8.4–10.5)
Chloride: 100 mEq/L (ref 96–112)
Creatinine, Ser: 1.29 mg/dL (ref 0.40–1.50)
GFR: 54.9 mL/min — ABNORMAL LOW (ref >60.00)
Glucose, Bld: 89 mg/dL (ref 70–99)
Potassium: 4.6 mEq/L (ref 3.5–5.1)
Sodium: 137 mEq/L (ref 135–145)
Total Bilirubin: 0.7 mg/dL (ref 0.2–1.2)
Total Protein: 6.5 g/dL (ref 6.0–8.3)

## 2020-03-23 LAB — CBC WITH DIFFERENTIAL/PLATELET
Basophils Absolute: 0.1 10*3/uL (ref 0.0–0.1)
Basophils Relative: 0.9 % (ref 0.0–3.0)
Eosinophils Absolute: 0.2 10*3/uL (ref 0.0–0.7)
Eosinophils Relative: 3.1 % (ref 0.0–5.0)
HCT: 41 % (ref 39.0–52.0)
Hemoglobin: 13.9 g/dL (ref 13.0–17.0)
Lymphocytes Relative: 27.7 % (ref 12.0–46.0)
Lymphs Abs: 1.7 10*3/uL (ref 0.7–4.0)
MCHC: 33.8 g/dL (ref 30.0–36.0)
MCV: 89.4 fl (ref 78.0–100.0)
Monocytes Absolute: 0.7 10*3/uL (ref 0.1–1.0)
Monocytes Relative: 11.6 % (ref 3.0–12.0)
Neutro Abs: 3.4 10*3/uL (ref 1.4–7.7)
Neutrophils Relative %: 56.7 % (ref 43.0–77.0)
Platelets: 228 10*3/uL (ref 150.0–400.0)
RBC: 4.59 Mil/uL (ref 4.22–5.81)
RDW: 13.1 % (ref 11.5–15.5)
WBC: 6 10*3/uL (ref 4.0–10.5)

## 2020-03-23 LAB — LIPID PANEL
Cholesterol: 151 mg/dL (ref 0–200)
HDL: 32.8 mg/dL — ABNORMAL LOW (ref >39.00)
LDL Cholesterol: 95 mg/dL (ref 0–99)
NonHDL: 118.54
Total CHOL/HDL Ratio: 5
Triglycerides: 117 mg/dL (ref 0.0–149.0)
VLDL: 23.4 mg/dL (ref 0.0–40.0)

## 2020-03-23 LAB — TSH: TSH: 5.45 u[IU]/mL — ABNORMAL HIGH (ref 0.35–4.50)

## 2020-03-24 ENCOUNTER — Other Ambulatory Visit: Payer: Self-pay | Admitting: Family Medicine

## 2020-03-24 NOTE — Telephone Encounter (Signed)
Please Advise

## 2020-03-24 NOTE — Telephone Encounter (Signed)
Sent. Thanks.   

## 2020-03-26 ENCOUNTER — Other Ambulatory Visit: Payer: Self-pay

## 2020-03-26 ENCOUNTER — Ambulatory Visit (INDEPENDENT_AMBULATORY_CARE_PROVIDER_SITE_OTHER): Payer: PPO

## 2020-03-26 DIAGNOSIS — Z0000 Encounter for general adult medical examination without abnormal findings: Secondary | ICD-10-CM | POA: Diagnosis not present

## 2020-03-26 NOTE — Progress Notes (Addendum)
Subjective:   [name] is a 74 y.o. male who presents for Medicare Annual/Subsequent preventive examination.  Review of Systems: N/A      I connected with the patient today by telephone and verified that I am speaking with the correct person using two identifiers. Location patient: home Location nurse: work Persons participating in the telephone visit: patient, nurse.   I discussed the limitations, risks, security and privacy concerns of performing an evaluation and management service by telephone and the availability of in person appointments. I also discussed with the patient that there may be a patient responsible charge related to this service. The patient expressed understanding and verbally consented to this telephonic visit.        Cardiac Risk Factors include: advanced age (>21mn, >>27women);male gender;hypertension     Objective:    Today's Vitals   There is no height or weight on file to calculate BMI.  Advanced Directives 03/26/2020 10/13/2019 08/15/2019 05/15/2019 04/24/2019 03/27/2019 01/14/2019  Does Patient Have a Medical Advance Directive? _0  Yes Yes  Type of AParamedicof ALydiaLiving will [place]uitaqueLiving will Living will;Healthcare Power of [place]Living will Living will [place]Living will -  Does patient want to make changes to medical advance directive? - - - No - Patient declined No - Patient declined No - Guardian declined -  Copy of HTolonoin Chart? No - copy requested - - No - copy requested, Physician notified - No - copy requested -  Would patient like information on creating a medical advance directive? - - - - - - -    Current Medications (verified) Outpatient Encounter Medications as of 03/26/2020  Medication Sig  . Cetirizine HCl (ALLERGY RELIEF) 10 MG CAPS Take 1 capsule by mouth daily as needed.  .  diazepam (VALIUM) 5 MG tablet Take 5 mg by mouth daily as needed for anxiety.  .Marland Kitchendiltiazem (CARDIZEM CD) 180 MG 24 hr capsule TAKE 1 CAPSULE BY MOUTH EVERY DAY  . fluticasone (FLONASE) 50 MCG/ACT nasal spray Place 2 sprays into both nostrils daily.  .Marland Kitchenibuprofen (ADVIL) 200 MG tablet Take 1-2 tablets (200-400 mg total) by mouth 2 (two) times daily as needed (with food.).  .Marland KitchenMenthol, Topical Analgesic, (BIOFREEZE EX) Apply 1 application topically daily as needed (pain).  . Multiple Vitamin (MULTIVITAMIN ADULT PO) Take 2 capsules by mouth daily. RED VEIN BALI  . omeprazole (PRILOSEC) 20 MG capsule Take 20 mg by mouth daily as needed (heart burn).  . pramipexole (MIRAPEX) 0.5 MG tablet TAKE 1 TABLET (0.5 MG TOTAL) BY MOUTH 3 (THREE) TIMES DAILY.  .Marland Kitchenpropranolol (INDERAL) 20 MG tablet Take 1 tablet (20 mg total) by mouth 2 (two) times daily.  .Marland KitchenpyridOXINE (VITAMIN B-6) 25 MG tablet Take 1 tablet (25 mg total) by mouth 3 (three) times daily as needed (nausea).  . traMADol (ULTRAM) 50 MG tablet TAKE 1 TABLET (50 MG TOTAL) BY MOUTH EVERY 12 (TWELVE) HOURS AS NEEDED.  .Marland Kitchenzolpidem (AMBIEN) 5 MG tablet Take 1 tablet (5 mg total) by mouth at bedtime as needed for sleep.   No facility-administered encounter medications on file as of 03/26/2020.    Allergies (verified) Flecainide, Flexeril [cyclobenzaprine], Hctz [hydrochlorothiazide], Morphine and related, Promethazine, and Sinemet [carbidopa-levodopa]   History: Past Medical History:  Diagnosis Date  . Arthritis   . GERD (gastroesophageal reflux disease)   . H/O urinary frequency   .  Heart murmur    as a child  . History of bronchitis   . HSV infection    oral  . Hypertension    improved after back pain treated with surgery  . Insomnia   . Insomnia    Improved with Ambien, did not tolerate melatonin.  . Joint pain   . OSA on CPAP    he ordered kit and started use 2017 w/o formal testing, see note from 11/30/15.   . Parkinson disease ([place])   .  Vertigo 10/13/2014   recurrent; trigger is cool air on left ear, improved with daily antihistamine   Past Surgical History:  Procedure Laterality Date  . ACHILLES TENDON REPAIR Left   . BACK SURGERY    . COLONOSCOPY  02/2011   Negative,[facility]  . DEBRIDEMENT AND CLOSURE WOUND Left 02/25/2018   Procedure: DEBRIDEMENT LEFT LEG APPLICATION INTEGRA;  Surgeon: Irene Limbo, MD;  Location: [facility];  Service: Plastics;  Laterality: Left;  . EYE SURGERY Bilateral    cataracts  . KNEE ARTHROSCOPY     L  . LUMBAR FUSION     2016  . LUMBAR SPINE SURGERY  03/31/2019   exploration of lumbar fusion w/L5-S1 decompression redo L4-5 fusion  . SPINE SURGERY  2003   cervical fusion  . TONSILLECTOMY    . varicoelectomy     Family History  Problem Relation Age of Onset  . Hypertension Mother   . Asthma Mother   . Diabetes Father   . Stroke Father   . Heart attack Paternal Grandfather        >55  . Cancer Sister   . Colon cancer Neg Hx   . Prostate cancer Neg Hx    Social History   Socioeconomic History  . Marital status: Married    Spouse name: Not on file  . Number of children: 2  . Years of education: 66  . Highest education level: Not on file  Occupational History  . Occupation: retired  Tobacco Use  . Smoking status: Never Smoker  . Smokeless tobacco: Never Used  Vaping Use  . Vaping Use: Never used  Substance and Sexual Activity  . Alcohol use: No  . Drug use: No  . Sexual activity: Not on file  Other Topics Concern  . 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     Social Determinants of Health   Financial Resource Strain: Low Risk   . Difficulty of Paying Living Expenses: Not hard at all  Food  Insecurity: No Food Insecurity  . Worried About Charity fundraiser in the Last Year: Never true  . Ran Out of Food in the Last Year: Never true  Transportation Needs: No Transportation Needs  . Lack of Transportation (Medical): No  . Lack of Transportation (Non-Medical): No  Physical Activity: Sufficiently Active  . Days of Exercise per Week: 4 days  . Minutes of Exercise per Session: 120 min  Stress: No Stress Concern Present  . Feeling of Stress : Not at all  Social Connections: Not on file    Tobacco Counseling Counseling given: Not Answered   Clinical Intake:  Pre-visit preparation completed: Yes  Pain : No/denies pain  Nutritional Risks: None Diabetes: No  How often do you need to have someone help you when you read instructions, pamphlets, or other written materials from your doctor or pharmacy?: 1 - Never What is the last grade level you completed in school?: masters  Diabetic: No Nutrition Risk Assessment:  Has the patient had any N/V/D within the last 2 months?  No  Does the patient have any non-healing wounds?  No  Has the patient had any unintentional weight loss or weight gain?  No   Diabetes:  Is the patient diabetic?  No  If diabetic, was a CBG obtained today?  N/A Did the patient bring in their glucometer from home?  N/A How often do you monitor your CBG's? N/A.   Financial Strains and Diabetes Management:  Are you having any financial strains with the device, your supplies or your medication? N/A.  Does the patient want to be seen by Chronic Care Management for management of their diabetes?  N/A Would the patient like to be referred to a Nutritionist or for Diabetic Management?  N/A   Interpreter Needed?: No  Information entered by :: CJohnson, LPN   Activities of Daily Living In your present state of health, do you have any difficulty performing the following activities: 03/26/2020 05/16/2019  Hearing? N N  Vision? N N  Difficulty  concentrating or making decisions? N N  Walking or climbing stairs? N N  Dressing or bathing? N N  Doing errands, shopping? N N  Preparing Food and eating ? N -  Using the Toilet? N -  In the past six months, have you accidently leaked urine? N -  Do you have problems with loss of bowel control? N -  Managing your Medications? N -  Managing your Finances? N -  Housekeeping or managing your Housekeeping? N -  Some recent data might be hidden    Patient Care Team: Tonia Ghent, MD as PCP - General (Family Medicine) Tat, Eustace Quail, DO as Consulting Physician (Neurology)  Indicate any recent Medical Services you may have received from other than [facility] providers in the past year (date may be approximate).     Assessment:   This is a routine wellness examination for [name].  Hearing/Vision screen  Hearing Screening   _0  _1  _2  _3  _4  _5  _6  _7  _8   Right ear:           Left ear:           Vision Screening Comments: Patient gets annual eye exams   Dietary issues and exercise activities discussed: Current Exercise Habits: Home exercise routine, Type of exercise: Other - see comments (cycling, biking), Time (Minutes): > 60, Frequency (Times/Week): 4, Weekly Exercise (Minutes/Week): 0, Intensity: Moderate, Exercise limited by: None identified  Goals    . Increase physical activity     Starting 04/11/2017, I will continue to exercise for 30 min 3-5 days per week.     . Patient Stated     03/26/2020, I will continue to ride bikes 3-4 days a week for about 2 hours.       Depression Screen PHQ 2/9 Scores 03/26/2020 03/09/2020 04/11/2017 11/30/2015 01/01/2015 10/13/2014 10/11/2012  PHQ - 2 Score 0 0 0 0 0 0 0  PHQ- 9 Score 0 - 0 - - - -    Fall Risk Fall Risk  03/26/2020 03/09/2020 10/13/2019 05/12/2019 01/14/2019  Falls in the past year? 0 0 0 0 0  Comment - - - - -  Number falls in past yr: 0 0 0 0 0  Injury with Fall? 0 0 0 0 0  Risk for fall due to  : Medication side effect - - - -  Follow up Falls evaluation completed;Falls prevention discussed Falls evaluation completed - - -    FALL RISK PREVENTION PERTAINING TO THE HOME:  Any stairs in or around the home? Yes  If so, are there any without handrails? No  Home free of loose throw rugs in walkways, pet beds, electrical cords, etc? Yes  Adequate lighting in your home to reduce risk of falls? Yes   ASSISTIVE DEVICES UTILIZED TO PREVENT FALLS:  Life alert? No  Use of a cane, walker or w/c? No  Grab bars in the bathroom? No  Shower chair or bench in shower? No  Elevated toilet seat or a handicapped toilet? No   TIMED UP AND GO:  Was the test performed? No .telephone visit   Cognitive Function: MMSE - Mini Mental State Exam 03/26/2020 04/11/2017 10/13/2014  Not completed: Refused - Unable to complete  Orientation to time - 5 -  Orientation to Place - 5 -  Registration - 3 -  Attention/ Calculation - 0 -  Recall - 3 -  Language- name 2 objects - 0 -  Language- repeat - 1 -  Language- follow 3 step command - 3 -  Language- read & follow direction - 0 -  Write a sentence - 0 -  Copy design - 0 -  Total score - 20 -  Normal cognitive status assessed by direct observation by this Nurse Health Advisor. No abnormalities found.    Mini Cog  Mini-Cog screen was not completed. Patient refused. Maximum score is 22. A value of 0 denotes this part of the MMSE was not completed or the patient failed this part of the Mini-Cog screening.       Immunizations Immunization History  Administered Date(s) Administered  . Pneumococcal Conjugate-13 05/01/2017    TDAP status: Due, Education has been provided regarding the importance of this vaccine. Advised may receive this vaccine at local pharmacy or Health Dept. Aware to provide a copy of the vaccination record if obtained from local pharmacy or Health Dept. Verbalized acceptance and understanding.  Flu Vaccine status: Declined,  Education has been provided regarding the importance of this vaccine but patient still declined. Advised may receive this vaccine at local pharmacy or Health Dept. Aware to provide a copy of the vaccination record if obtained from local pharmacy or Health Dept. Verbalized acceptance and understanding.  Pneumococcal vaccine status: Declined,  Education has been provided regarding the importance of this vaccine but patient still declined. Advised may receive this vaccine at local pharmacy or Health Dept. Aware to provide a copy of the vaccination record if obtained from local pharmacy or Health Dept. Verbalized acceptance and understanding.   Covid-19 vaccine status: Declined, Education has been provided regarding the importance of this vaccine but patient still declined. Advised may receive this vaccine at local pharmacy or Health Dept.or vaccine clinic. Aware to provide a copy of the vaccination record if obtained from local pharmacy or Health Dept. Verbalized acceptance and understanding.  Qualifies for Shingles Vaccine? Yes   Zostavax completed No   Shingrix Completed?: No.    Education has been provided regarding the importance of this vaccine. Patient has been advised to call insurance company to determine out of pocket expense if they have not yet received this vaccine. Advised may also receive vaccine  at local pharmacy or Health Dept. Verbalized acceptance and understanding.  Screening Tests Health Maintenance  Topic Date Due  . INFLUENZA VACCINE  07/15/2020 (Originally 11/16/2019)  . PNA vac Low Risk Adult (2 of 2 - PPSV23) 03/26/2021 (Originally 05/01/2018)  . COVID-19 Vaccine (1) 04/11/2021 (Originally 06/12/1957)  . TETANUS/TDAP  03/26/2024 (Originally 06/12/1964)  . COLONOSCOPY  01/21/2030  . Hepatitis C Screening  Completed    Health Maintenance  There are no preventive care reminders to display for this patient.  Colorectal cancer screening: Type of screening: Colonoscopy. Completed  01/22/2020. Repeat every 10 years  Lung Cancer Screening: (Low Dose CT Chest recommended if Age 65-80 years, 30 pack-year currently smoking OR have quit w/in 15 years.) does not qualify.    Additional Screening:  Hepatitis C Screening: does qualify; Completed 04/17/1998  Vision Screening: Recommended annual ophthalmology exams for early detection of glaucoma and other disorders of the eye. Is the patient up to date with their annual eye exam?  Yes  Who is the provider or what is the name of the office in which the patient attends annual eye exams? [facility], Dr. Elta Guadeloupe If pt is not established with a provider, would they like to be referred to a provider to establish care? No .   Dental Screening: Recommended annual dental exams for proper oral hygiene  Community Resource Referral / Chronic Care Management: CRR required this visit?  No   CCM required this visit?  No      Plan:     I have personally reviewed and noted the following in the patient's chart:   . Medical and social history . Use of alcohol, tobacco or illicit drugs  . Current medications and supplements . Functional ability and status . Nutritional status . Physical activity . Advanced directives . List of other physicians . Hospitalizations, surgeries, and ER visits in previous 12 months . Vitals . Screenings to include cognitive, depression, and falls . Referrals and appointments  In addition, I have reviewed and discussed with patient certain preventive protocols, quality metrics, and best practice recommendations. A written personalized care plan for preventive services as well as general preventive health recommendations were provided to patient.   Due to this being a telephonic visit, the after visit summary with patients personalized plan was offered to patient via office or my-chart. Patient preferred to pick up at office at next visit via mychart.   Andrez Grime, LPN   [id number]

## 2020-03-26 NOTE — Patient Instructions (Signed)
[name] , Thank you for taking time to come for your Medicare Wellness Visit. I appreciate your ongoing commitment to your health goals. Please review the following plan we discussed and let me know if I can assist you in the future.   Screening recommendations/referrals: Colonoscopy: Up to date, completed 01/22/2020, due 01/2030 Recommended yearly ophthalmology/optometry visit for glaucoma screening and checkup Recommended yearly dental visit for hygiene and checkup  Vaccinations: Influenza vaccine: declined Pneumococcal vaccine: declined Tdap vaccine: decline-insurance Shingles vaccine: due, check with your insurance regarding coverage/cost if interested   Covid-19: declined  Advanced directives: Please bring a copy of your POA (Power of [place]) and/or Living Will to your next appointment.   Conditions/risks identified: hypertension  Next appointment: Follow up in one year for your annual wellness visit.   Preventive Care 41 Years and Older, Male Preventive care refers to lifestyle choices and visits with your health care provider that can promote health and wellness. What does preventive care include?  A yearly physical exam. This is also called an annual well check.  Dental exams once or twice a year.  Routine eye exams. Ask your health care provider how often you should have your eyes checked.  Personal lifestyle choices, including:  Daily care of your teeth and gums.  Regular physical activity.  Eating a healthy diet.  Avoiding tobacco and drug use.  Limiting alcohol use.  Practicing safe sex.  Taking low doses of aspirin every day.  Taking vitamin and mineral supplements as recommended by your health care provider. What happens during an annual well check? The services and screenings done by your health care provider during your annual well check will depend on your age, overall health, lifestyle risk factors, and family history of disease. Counseling   Your health care provider may ask you questions about your:  Alcohol use.  Tobacco use.  Drug use.  Emotional well-being.  Home and relationship well-being.  Sexual activity.  Eating habits.  History of falls.  Memory and ability to understand (cognition).  Work and work Statistician. Screening  You may have the following tests or measurements:  Height, weight, and BMI.  Blood pressure.  Lipid and cholesterol levels. These may be checked every 5 years, or more frequently if you are over 63 years old.  Skin check.  Lung cancer screening. You may have this screening every year starting at age 37 if you have a 30-pack-year history of smoking and currently smoke or have quit within the past 15 years.  Fecal occult blood test (FOBT) of the stool. You may have this test every year starting at age 79.  Flexible sigmoidoscopy or colonoscopy. You may have a sigmoidoscopy every 5 years or a colonoscopy every 10 years starting at age 23.  Prostate cancer screening. Recommendations will vary depending on your family history and other risks.  Hepatitis C blood test.  Hepatitis B blood test.  Sexually transmitted disease (STD) testing.  Diabetes screening. This is done by checking your blood sugar (glucose) after you have not eaten for a while (fasting). You may have this done every 1-3 years.  Abdominal aortic aneurysm (AAA) screening. You may need this if you are a current or former smoker.  Osteoporosis. You may be screened starting at age 57 if you are at high risk. Talk with your health care provider about your test results, treatment options, and if necessary, the need for more tests. Vaccines  Your health care provider may recommend certain vaccines, such as:  Influenza vaccine. This is recommended every year.  Tetanus, diphtheria, and acellular pertussis (Tdap, Td) vaccine. You may need a Td booster every 10 years.  Zoster vaccine. You may need this after age  7.  Pneumococcal 13-valent conjugate (PCV13) vaccine. One dose is recommended after age 54.  Pneumococcal polysaccharide (PPSV23) vaccine. One dose is recommended after age 41. Talk to your health care provider about which screenings and vaccines you need and how often you need them. This information is not intended to replace advice given to you by your health care provider. Make sure you discuss any questions you have with your health care provider. Document Released: 04/30/2015 Document Revised: 12/22/2015 Document Reviewed: 02/02/2015 Elsevier Interactive Patient Education  2017 [employer] Prevention in the Home Falls can cause injuries. They can happen to people of all ages. There are many things you can do to make your home safe and to help prevent falls. What can I do on the outside of my home?  Regularly fix the edges of walkways and driveways and fix any cracks.  Remove anything that might make you trip as you walk through a door, such as a raised step or threshold.  Trim any bushes or trees on the path to your home.  Use bright outdoor lighting.  Clear any walking paths of anything that might make someone trip, such as rocks or tools.  Regularly check to see if handrails are loose or broken. Make sure that both sides of any steps have handrails.  Any raised decks and porches should have guardrails on the edges.  Have any leaves, snow, or ice cleared regularly.  Use sand or salt on walking paths during winter.  Clean up any spills in your garage right away. This includes oil or grease spills. What can I do in the bathroom?  Use night lights.  Install grab bars by the toilet and in the tub and shower. Do not use towel bars as grab bars.  Use non-skid mats or decals in the tub or shower.  If you need to sit down in the shower, use a plastic, non-slip stool.  Keep the floor dry. Clean up any water that spills on the floor as soon as it happens.  Remove  soap buildup in the tub or shower regularly.  Attach bath mats securely with double-sided non-slip rug tape.  Do not have throw rugs and other things on the floor that can make you trip. What can I do in the bedroom?  Use night lights.  Make sure that you have a light by your bed that is easy to reach.  Do not use any sheets or blankets that are too big for your bed. They should not hang down onto the floor.  Have a firm chair that has side arms. You can use this for support while you get dressed.  Do not have throw rugs and other things on the floor that can make you trip. What can I do in the kitchen?  Clean up any spills right away.  Avoid walking on wet floors.  Keep items that you use a lot in easy-to-reach places.  If you need to reach something above you, use a strong step stool that has a grab bar.  Keep electrical cords out of the way.  Do not use floor polish or wax that makes floors slippery. If you must use wax, use non-skid floor wax.  Do not have throw rugs and other things on the floor that  can make you trip. What can I do with my stairs?  Do not leave any items on the stairs.  Make sure that there are handrails on both sides of the stairs and use them. Fix handrails that are broken or loose. Make sure that handrails are as long as the stairways.  Check any carpeting to make sure that it is firmly attached to the stairs. Fix any carpet that is loose or worn.  Avoid having throw rugs at the top or bottom of the stairs. If you do have throw rugs, attach them to the floor with carpet tape.  Make sure that you have a light switch at the top of the stairs and the bottom of the stairs. If you do not have them, ask someone to add them for you. What else can I do to help prevent falls?  Wear shoes that:  Do not have high heels.  Have rubber bottoms.  Are comfortable and fit you well.  Are closed at the toe. Do not wear sandals.  If you use a  stepladder:  Make sure that it is fully opened. Do not climb a closed stepladder.  Make sure that both sides of the stepladder are locked into place.  Ask someone to hold it for you, if possible.  Clearly mark and make sure that you can see:  Any grab bars or handrails.  First and last steps.  Where the edge of each step is.  Use tools that help you move around (mobility aids) if they are needed. These include:  Canes.  Walkers.  Scooters.  Crutches.  Turn on the lights when you go into a dark area. Replace any light bulbs as soon as they burn out.  Set up your furniture so you have a clear path. Avoid moving your furniture around.  If any of your floors are uneven, fix them.  If there are any pets around you, be aware of where they are.  Review your medicines with your doctor. Some medicines can make you feel dizzy. This can increase your chance of falling. Ask your doctor what other things that you can do to help prevent falls. This information is not intended to replace advice given to you by your health care provider. Make sure you discuss any questions you have with your health care provider. Document Released: 01/28/2009 Document Revised: 09/09/2015 Document Reviewed: 05/08/2014 Elsevier Interactive Patient Education  2017 [employer].

## 2020-03-26 NOTE — Progress Notes (Signed)
PCP notes:  Health Maintenance: Pneumococcal 23- declined Flu- declined Tdap- declined Covid- declined   Abnormal Screenings: none   Patient concerns: TSH high, discomfort on left side of throat, has appt with Dr. Lula Olszewski on 12/20 @ 9:30 am    Nurse concerns: none   Next PCP appt.: 03/30/2020 @ 10:30 am

## 2020-03-30 ENCOUNTER — Other Ambulatory Visit: Payer: Self-pay

## 2020-03-30 ENCOUNTER — Ambulatory Visit (INDEPENDENT_AMBULATORY_CARE_PROVIDER_SITE_OTHER): Payer: PPO | Admitting: Family Medicine

## 2020-03-30 ENCOUNTER — Encounter: Payer: Self-pay | Admitting: Family Medicine

## 2020-03-30 VITALS — BP 120/70 | HR 66 | Temp 96.9°F | Ht 75.0 in | Wt 221.8 lb

## 2020-03-30 DIAGNOSIS — E039 Hypothyroidism, unspecified: Secondary | ICD-10-CM

## 2020-03-30 DIAGNOSIS — R399 Unspecified symptoms and signs involving the genitourinary system: Secondary | ICD-10-CM

## 2020-03-30 DIAGNOSIS — G20 Parkinson's disease: Secondary | ICD-10-CM

## 2020-03-30 DIAGNOSIS — M545 Low back pain, unspecified: Secondary | ICD-10-CM

## 2020-03-30 DIAGNOSIS — Z0000 Encounter for general adult medical examination without abnormal findings: Secondary | ICD-10-CM

## 2020-03-30 DIAGNOSIS — H6980 Other specified disorders of Eustachian tube, unspecified ear: Secondary | ICD-10-CM

## 2020-03-30 DIAGNOSIS — G4700 Insomnia, unspecified: Secondary | ICD-10-CM | POA: Diagnosis not present

## 2020-03-30 NOTE — Progress Notes (Signed)
This visit occurred during the SARS-CoV-2 public health emergency.  Safety protocols were in place, including screening questions prior to the visit, additional usage of staff PPE, and extensive cleaning of exam room while observing appropriate contact time as indicated for disinfecting solutions.  TSH mild high.  D/w [name] about options.    He has some intermittent L ear sx.  discomfort on left side of throat, has appt with Dr. Pryor Ochoa on 12/20 @ 9:30 am.  We talked about ETD.  flonase helped some from prev.    Parkinson's disease.  On Mirapex at baseline, tremor at baseline, intermittent.   He has neuro f/u pending.    Rare valium use, not in the last 3 months.    anbien used rarely.  Usually uses melatonin.  Waking at night, sometimes from nocturia.  He keeps urinal at the bedside.  Nocturia worse with caffeine so he limits that.  He'll update me as needed, it isn't bothersome enough to treat per patient report.  We talked about trying B6 at night to see if that helps with sleep otherwise.  He has used tramadol prn w/o ADE for hip and back pain.  D/w [name].   Flu vaccine - [name] declined, d/w [name].  PNA vaccines- [name] declined, d/w [name].  Covid vaccine declined. Tetanus d/w [name].   Shingles d/w [name].  Prostate cancer screening and PSA options (with potential risks and benefits of testing vs not testing) were discussed along with recent recs/guidelines.  He declined testing PSA at this point. Colonoscopy 2021 Advance directive- wife designated if patient were incapacitated.  Meds, vitals, and allergies reviewed.   ROS: Per HPI unless specifically indicated in ROS section   GEN: nad, alert and oriented HEENT: ncat, TM w/o erythema B NECK: supple w/o LA, no neck mass noted. CV: rrr.  PULM: ctab, no inc wob ABD: soft, +bs EXT: no edema SKIN: no acute rash    At least 30 minutes were devoted to patient care in this encounter (this can include time spent reviewing the patient's file/history,  interviewing and examining the patient, counseling/reviewing plan with patient, ordering referrals, ordering tests, reviewing relevant laboratory or x-ray data, and documenting the encounter).

## 2020-03-30 NOTE — Patient Instructions (Addendum)
Recheck thyroid labs in about 3 months at a lab visit.  I'll await the notes from [place] and [place].   Take care.  Glad to see you.

## 2020-03-31 DIAGNOSIS — E039 Hypothyroidism, unspecified: Secondary | ICD-10-CM | POA: Insufficient documentation

## 2020-03-31 DIAGNOSIS — Z0000 Encounter for general adult medical examination without abnormal findings: Secondary | ICD-10-CM | POA: Insufficient documentation

## 2020-03-31 NOTE — Assessment & Plan Note (Signed)
Recheck thyroid labs in about 3 months at a lab visit.  Discussed.  He agrees.  No change in medication at this point.

## 2020-03-31 NOTE — Assessment & Plan Note (Signed)
Tremor at baseline, intermittent.   He has neuro f/u pending.   I will defer.  He agrees.

## 2020-03-31 NOTE — Assessment & Plan Note (Signed)
Flu vaccine - pt declined, d/w pt.  PNA vaccines- pt declined, d/w pt.  Covid vaccine declined. Tetanus d/w pt.   Shingles d/w pt.  Prostate cancer screening and PSA options (with potential risks and benefits of testing vs not testing) were discussed along with recent recs/guidelines.  He declined testing PSA at this point. Colonoscopy 2021 Advance directive- wife designated if patient were incapacitated.

## 2020-03-31 NOTE — Assessment & Plan Note (Signed)
He has some intermittent L ear sx.  discomfort on left side of throat, has appt with Dr. Pryor Ochoa on 12/20 @ 9:30 am.  We talked about ETD.  flonase helped some from prev.   I will await ENT input.

## 2020-03-31 NOTE — Assessment & Plan Note (Signed)
He can try adding on B6 at night to see if that helps.  He will update me as needed.

## 2020-03-31 NOTE — Assessment & Plan Note (Signed)
He will update me if more bothersome.  See above.

## 2020-03-31 NOTE — Assessment & Plan Note (Signed)
Continue as needed tramadol.  He will update me as needed.

## 2020-04-01 NOTE — Progress Notes (Signed)
Assessment/Plan:   1.  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  Discussed reasoning.    -he asked about DBS and focused ultrasound.  Discussed those but he is no where close to needing those.  -Follows regularly with dermatology.  Understands that Parkinson's slightly increases risk of melanoma.  2.  Atrial fibrillation  -On Xarelto.  Follows with Dr. Einar Gip  3.  Insomnia  -start mirtazapine, 15 mg q hs.  R/B/SE were discussed.  The opportunity to ask questions was given and they were answered to the best of my ability.  The patient expressed understanding and willingness to follow the outlined treatment protocols.  4.  Back pain  -told him he needs to f/u with Dr. Arnoldo Morale  5.  Pt headed to [facility] for next few months.  Will see him in next 5-6 months. Subjective:   [name] was seen today in follow up for Parkinsons disease.  My previous records were reviewed prior to todays visit as well as outside records available to me. Pt denies falls.  Pt denies lightheadedness, near syncope.  No hallucinations.  Mood has been good.  Having pain in back - getting injections.  Trouble with sleeping - some related to urination but not all.  Not dreaming.  Patient last saw primary care on December 14.  Those notes have been reviewed.  Current prescribed movement disorder medications: Pramipexole 0.5 mg three times per day   PREVIOUS MEDICATIONS: Levodopa (patient thought he had chest pain and palpitations with it, but no longer there after was diagnosed with new onset A. fib)  ALLERGIES:   Allergies  Allergen Reactions  . Flecainide     Vomit   . Flexeril [Cyclobenzaprine]     Constipation  . Hctz [Hydrochlorothiazide]     hyponatremia  . Morphine And Related Hives    Hallucinations- can tolerate oxycodone.   . Promethazine      Would avoid due to Parkinsons  . Sinemet [Carbidopa-Levodopa]     intolerant    CURRENT MEDICATIONS:  Outpatient Encounter Medications as of 04/06/2020  Medication Sig  . Cetirizine HCl (ALLERGY RELIEF) 10 MG CAPS Take 1 capsule by mouth daily as needed.  . diazepam (VALIUM) 5 MG tablet Take 5 mg by mouth daily as needed for anxiety.  Marland Kitchen diltiazem (CARDIZEM CD) 180 MG 24 hr capsule TAKE 1 CAPSULE BY MOUTH EVERY DAY  . fluticasone (FLONASE) 50 MCG/ACT nasal spray Place 2 sprays into both nostrils daily.  Marland Kitchen ibuprofen (ADVIL) 200 MG tablet Take 1-2 tablets (200-400 mg total) by mouth 2 (two) times daily as needed (with food.).  Marland Kitchen lactose free nutrition (BOOST) LIQD Take 237 mLs by mouth daily.  . Menthol, Topical Analgesic, (BIOFREEZE EX) Apply 1 application topically daily as needed (pain).  Marland Kitchen omeprazole (PRILOSEC) 20 MG capsule Take 20 mg by mouth daily as needed (heart burn).  . pramipexole (MIRAPEX) 0.5 MG tablet TAKE 1 TABLET (0.5 MG TOTAL) BY MOUTH 3 (THREE) TIMES DAILY.  Marland Kitchen propranolol (INDERAL) 20 MG tablet Take 1 tablet (20 mg total) by mouth 2 (two) times daily.  Marland Kitchen pyridOXINE (VITAMIN B-6) 25 MG tablet Take 1 tablet (25 mg total) by mouth 3 (three) times daily as needed (nausea).  . traMADol (ULTRAM) 50 MG tablet TAKE 1 TABLET (50 MG TOTAL) BY MOUTH EVERY 12 (TWELVE) HOURS AS  NEEDED.  Marland Kitchen zolpidem (AMBIEN) 5 MG tablet Take 1 tablet (5 mg total) by mouth at bedtime as needed for sleep.  . [DISCONTINUED] Multiple Vitamin (MULTIVITAMIN ADULT PO) Take 2 capsules by mouth daily. RED VEIN BALI (Patient not taking: Reported on 04/06/2020)   No facility-administered encounter medications on file as of 04/06/2020.    Objective:   PHYSICAL EXAMINATION:    VITALS:   Vitals:   04/06/20 1034  BP: 120/82  Pulse: 66  SpO2: 97%  Weight: 223 lb (101.2 kg)  Height: 6\' 3"  (1.905 m)    GEN:  The patient appears stated age and is in NAD. HEENT:  Normocephalic, atraumatic.  The mucous membranes  are moist. The superficial temporal arteries are without ropiness or tenderness. CV:  RRR Lungs:  CTAB Neck/HEME:  There are no carotid bruits bilaterally.  Neurological examination:  Orientation: The patient is alert and oriented x3. Cranial nerves: There is good facial symmetry with facial hypomimia. The speech is fluent and clear. Soft palate rises symmetrically and there is no tongue deviation. Hearing is intact to conversational tone. Sensation: Sensation is intact to light touch throughout Motor: Strength is at least antigravity x4.  Movement examination: Tone: There is nl tone in the UE/LE Abnormal movements: occ tremor in the rue Coordination:  There is mild decremation with RAM's, with foot taps bilaterally, R>L Gait and Station: The patient has no difficulty arising out of a deep-seated chair without the use of the hands. The patient's stride length is good.    I have reviewed and interpreted the following labs independently    Chemistry      Component Value Date/Time   NA 137 03/23/2020 0924   K 4.6 03/23/2020 0924   CL 100 03/23/2020 0924   CO2 29 03/23/2020 0924   BUN 14 03/23/2020 0924   CREATININE 1.29 03/23/2020 0924   CREATININE 1.32 (H) 01/14/2019 0958      Component Value Date/Time   CALCIUM 9.3 03/23/2020 0924   ALKPHOS 63 03/23/2020 0924   AST 16 03/23/2020 0924   ALT 16 03/23/2020 0924   BILITOT 0.7 03/23/2020 0924       Lab Results  Component Value Date   WBC 6.0 03/23/2020   HGB 13.9 03/23/2020   HCT 41.0 03/23/2020   MCV 89.4 03/23/2020   PLT 228.0 03/23/2020    Lab Results  Component Value Date   TSH 5.45 (H) 03/23/2020     Total time spent on today's visit was 30 minutes, including both face-to-face time and nonface-to-face time.  Time included that spent on review of records (prior notes available to me/labs/imaging if pertinent), discussing treatment and goals, answering patient's questions and coordinating care.  Cc:  Tonia Ghent, MD

## 2020-04-05 ENCOUNTER — Other Ambulatory Visit: Payer: Self-pay | Admitting: Otolaryngology

## 2020-04-05 DIAGNOSIS — R221 Localized swelling, mass and lump, neck: Secondary | ICD-10-CM | POA: Diagnosis not present

## 2020-04-05 DIAGNOSIS — R1312 Dysphagia, oropharyngeal phase: Secondary | ICD-10-CM

## 2020-04-05 DIAGNOSIS — H9202 Otalgia, left ear: Secondary | ICD-10-CM | POA: Diagnosis not present

## 2020-04-05 DIAGNOSIS — R070 Pain in throat: Secondary | ICD-10-CM | POA: Diagnosis not present

## 2020-04-06 ENCOUNTER — Encounter: Payer: Self-pay | Admitting: Neurology

## 2020-04-06 ENCOUNTER — Other Ambulatory Visit: Payer: Self-pay

## 2020-04-06 ENCOUNTER — Ambulatory Visit: Payer: PPO | Admitting: Neurology

## 2020-04-06 VITALS — BP 120/82 | HR 66 | Ht 75.0 in | Wt 223.0 lb

## 2020-04-06 DIAGNOSIS — G20 Parkinson's disease: Secondary | ICD-10-CM

## 2020-04-06 DIAGNOSIS — G4701 Insomnia due to medical condition: Secondary | ICD-10-CM | POA: Diagnosis not present

## 2020-04-06 DIAGNOSIS — G20A1 Parkinson's disease without dyskinesia, without mention of fluctuations: Secondary | ICD-10-CM

## 2020-04-06 MED ORDER — MIRTAZAPINE 15 MG PO TABS: 15.0000 mg | ORAL_TABLET | Freq: Every day | ORAL | 1 refills | Status: DC

## 2020-04-06 NOTE — Patient Instructions (Signed)
1.  Start mirtazapine 15 mg at bedtime for sleep 2.  Merry Christmas!  The physicians and staff at [facility] are committed to providing excellent care. You may receive a survey requesting feedback about your experience at our office. We strive to receive "very good" responses to the survey questions. If you feel that your experience would prevent you from giving the office a "very good " response, please contact our office to try to remedy the situation. We may be reached at [phone]. Thank you for taking the time out of your busy day to complete the survey.

## 2020-04-14 ENCOUNTER — Other Ambulatory Visit: Payer: Self-pay

## 2020-04-14 ENCOUNTER — Ambulatory Visit
Admission: RE | Admit: 2020-04-14 | Discharge: 2020-04-14 | Disposition: A | Discharge status: Home / Self Care | Payer: PPO | Source: Ambulatory Visit | Attending: Otolaryngology | Admitting: Otolaryngology

## 2020-04-14 DIAGNOSIS — Z981 Arthrodesis status: Secondary | ICD-10-CM | POA: Diagnosis not present

## 2020-04-14 DIAGNOSIS — R1312 Dysphagia, oropharyngeal phase: Secondary | ICD-10-CM | POA: Diagnosis not present

## 2020-04-14 DIAGNOSIS — R1310 Dysphagia, unspecified: Secondary | ICD-10-CM | POA: Diagnosis not present

## 2020-04-14 DIAGNOSIS — R221 Localized swelling, mass and lump, neck: Secondary | ICD-10-CM | POA: Diagnosis not present

## 2020-04-14 DIAGNOSIS — M542 Cervicalgia: Secondary | ICD-10-CM | POA: Diagnosis not present

## 2020-04-14 MED ORDER — IOHEXOL 300 MG/ML  SOLN
75.0000 mL | Freq: Once | INTRAMUSCULAR | Status: AC | PRN
  Administered 2020-04-14: 75 mL via INTRAVENOUS

## 2020-04-24 ENCOUNTER — Other Ambulatory Visit: Payer: Self-pay | Admitting: Neurology

## 2020-04-26 ENCOUNTER — Telehealth: Payer: Self-pay

## 2020-04-26 DIAGNOSIS — M545 Low back pain, unspecified: Secondary | ICD-10-CM

## 2020-04-26 NOTE — Telephone Encounter (Signed)
I put in the referral.  Thanks.  

## 2020-04-26 NOTE — Telephone Encounter (Signed)
Patient states he has been having back injections every so many months and he is due for one now. He is down in [place] (he does this every year) and they need a referral from PCP sent to them in order to see patient and get this done. Can we get this ordered and send it in?  [phone].  Address [address], [place] [address]

## 2020-04-26 NOTE — Telephone Encounter (Signed)
Rx(s) sent to pharmacy electronically.  

## 2020-04-30 NOTE — Telephone Encounter (Signed)
This was processed and faxed on 04/28/20

## 2020-05-05 NOTE — Telephone Encounter (Signed)
Left detailed message for the patient letting him know and make sure there was nothing else needed. Advised patient on the message to let us know if there was anything else needed at this time.

## 2020-05-07 ENCOUNTER — Other Ambulatory Visit: Payer: Self-pay | Admitting: Cardiology

## 2020-05-07 DIAGNOSIS — I480 Paroxysmal atrial fibrillation: Secondary | ICD-10-CM

## 2020-06-16 IMAGING — DX DG CHEST 1V PORT
1 series · 1 of 1 positions shown · non-contrast
Comparison: 12/20/2014.

CLINICAL DATA: Chest pain.  Palpitations.

EXAM:
PORTABLE CHEST 1 VIEW

[chest ap]
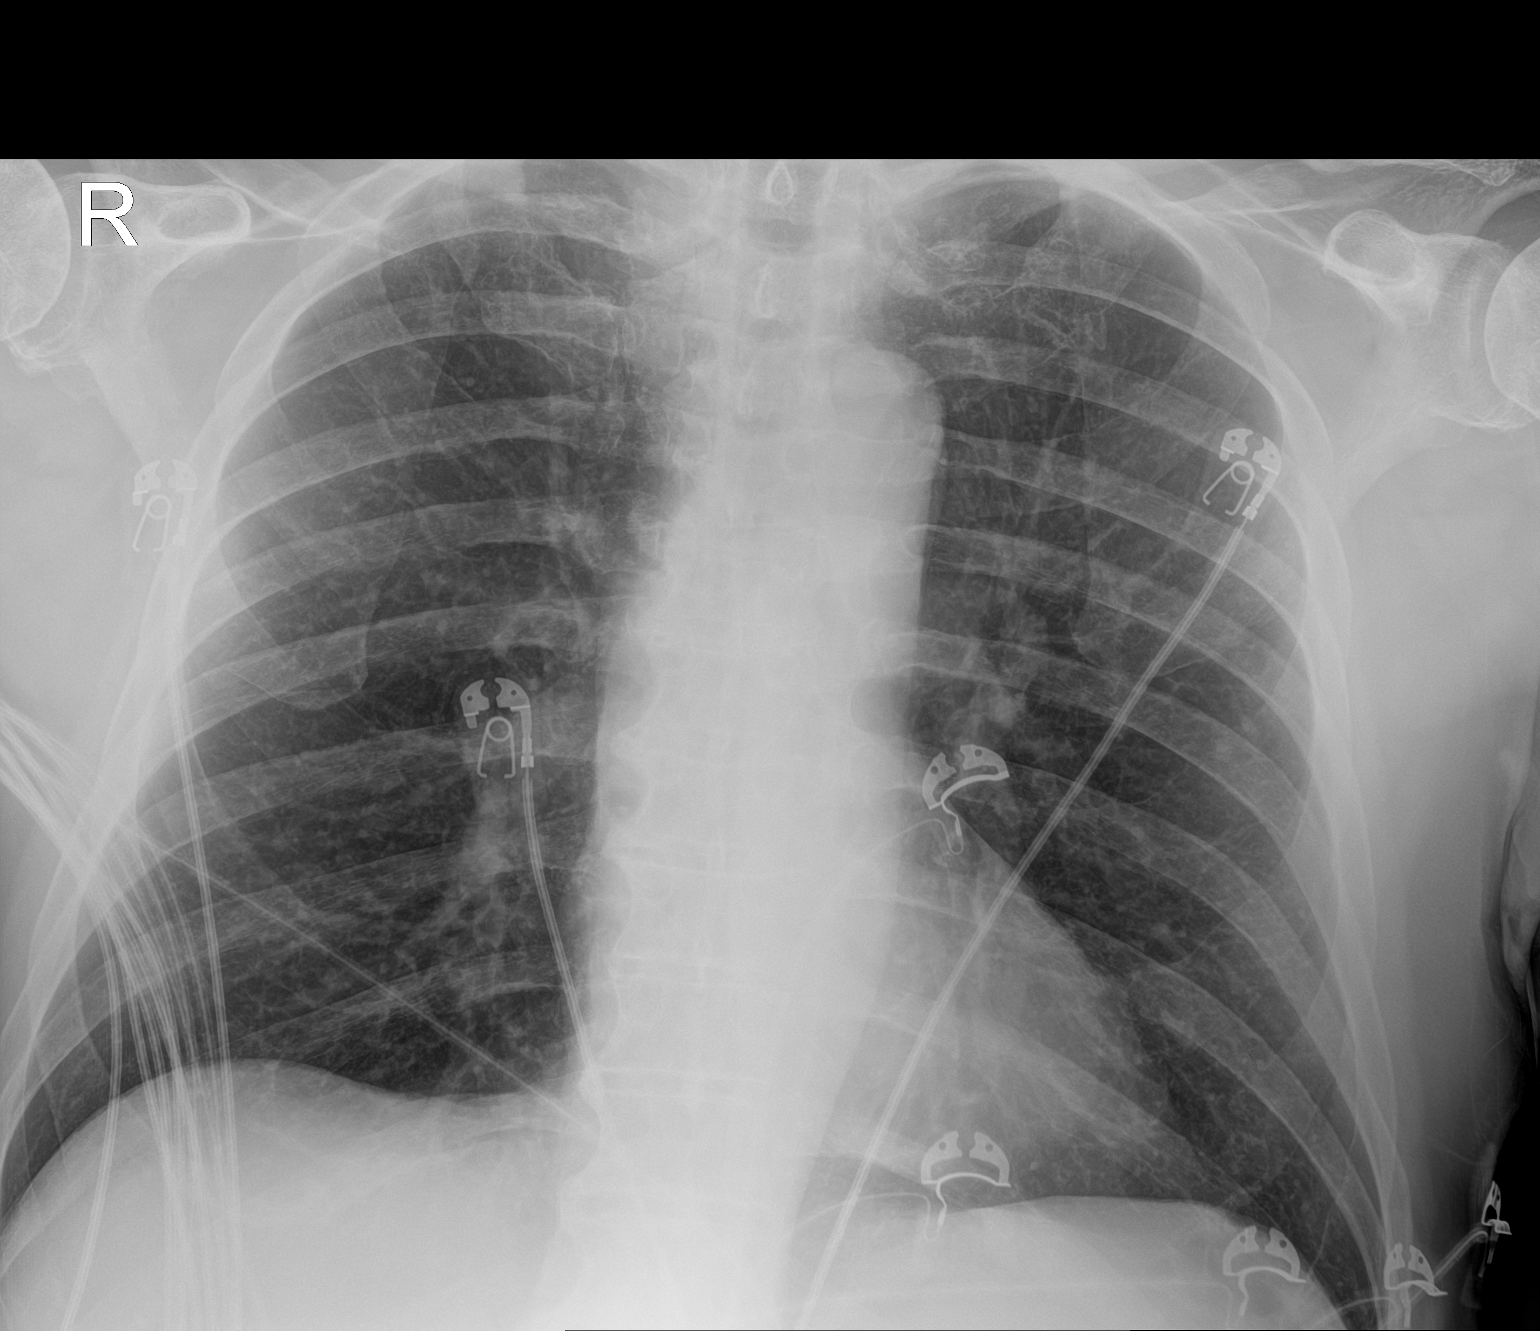

[1 of 1 positions shown; findings below may reference images not displayed]

FINDINGS: Mediastinum and hilar structures normal. Heart size normal. Lungs
are clear. No pleural effusion or pneumothorax. Prior cervical spine
fusion. Degenerative change thoracic spine.
IMPRESSION: No acute cardiopulmonary disease.

## 2020-06-22 ENCOUNTER — Telehealth: Payer: Self-pay | Admitting: Student

## 2020-06-22 DIAGNOSIS — I10 Essential (primary) hypertension: Secondary | ICD-10-CM | POA: Diagnosis not present

## 2020-06-22 MED ORDER — DILTIAZEM HCL ER COATED BEADS 240 MG PO CP24: 240.0000 mg | ORAL_CAPSULE | Freq: Every day | ORAL | 3 refills | Status: DC

## 2020-06-22 MED ORDER — OLMESARTAN MEDOXOMIL 20 MG PO TABS
20.0000 mg | ORAL_TABLET | Freq: Every day | ORAL | 3 refills | Status: DC
Start: 2020-06-22 — End: 2020-10-08

## 2020-06-22 NOTE — Telephone Encounter (Addendum)
Swelling over last 1 week bilaterally. Riding bike for miles daily 74 - 84 bpm. Tonight was first bp check and elevated.   In [facility] for 3 months. No tia or cva. Coming back in April.   ON-CALL CARDIOLOGY 06/22/2020 08:55 pm  Patient's name: [name].   MRN: [id number].    DOB: Apr 06, 1946 Primary care provider: Tonia Ghent, MD. Primary cardiologist: Dr. Einar Gip   Interaction regarding this patient's care today: Patient called cardiology on call with concerns of bilateral lower leg edema over the last 1 week.  He is presently living in [place] for the last 3 months and will not be returning to [place] until the end of April.  He is relatively active, he states he rides an electric bike for several miles each day without issue.  Denies chest pain, palpitations, dizziness.  He unfortunately does not measure his blood pressure on a regular basis and, therefore is unable to provide double readings at this time.  However his blood pressure is elevated at 150/78 mmHg. patient is also requesting a refill of diltiazem be sent to pharmacy in [place] as he has been without it.  Impression:   ICD-10-CM   1. Essential hypertension  I10 diltiazem (CARDIZEM CD) 240 MG 24 hr capsule    olmesartan (BENICAR) 20 MG tablet    Brain natriuretic peptide    Basic metabolic panel    Meds ordered this encounter  Medications  . diltiazem (CARDIZEM CD) 240 MG 24 hr capsule    Sig: Take 1 capsule (240 mg total) by mouth daily.    Dispense:  30 capsule    Refill:  3  . olmesartan (BENICAR) 20 MG tablet    Sig: Take 1 tablet (20 mg total) by mouth daily.    Dispense:  30 tablet    Refill:  3    Orders Placed This Encounter  Procedures  . Brain natriuretic peptide  . Basic metabolic panel    Recommendations: We will refill diltiazem at this time.  Also recommend patient start olmesartan 20 mg daily for improved blood pressure control.  Will obtain repeat BMP as well as BNP in view of swelling  in 1 week.  Patient verbalized understanding and agreement.  Patient will also call our office when he returns to [place] to schedule follow-up appointment.  Discussed with him regarding signs and symptoms that would warrant urgent/emergent evaluation, he verbalized understanding.  Telephone encounter total time: 12 minutes     Alethia Berthold, PA-C 06/28/2020, 5:23 PM Office: [phone]

## 2020-07-01 DIAGNOSIS — I10 Essential (primary) hypertension: Secondary | ICD-10-CM | POA: Diagnosis not present

## 2020-07-02 LAB — BRAIN NATRIURETIC PEPTIDE: BNP: 51.5 pg/mL (ref 0.0–100.0)

## 2020-07-02 LAB — BASIC METABOLIC PANEL
BUN/Creatinine Ratio: 10 (ref 10–24)
BUN: 12 mg/dL (ref 8–27)
CO2: 26 mmol/L (ref 20–29)
Calcium: 9.1 mg/dL (ref 8.6–10.2)
Chloride: 102 mmol/L (ref 96–106)
Creatinine, Ser: 1.2 mg/dL (ref 0.76–1.27)
Glucose: 91 mg/dL (ref 65–99)
Potassium: 4.7 mmol/L (ref 3.5–5.2)
Sodium: 139 mmol/L (ref 134–144)
eGFR: 63 mL/min/{1.73_m2} (ref >59)

## 2020-07-02 NOTE — Telephone Encounter (Signed)
BNP (measure of heart failure) was normal.  Renal function and electrolytes stable.  He can continue present medications.

## 2020-07-05 NOTE — Telephone Encounter (Signed)
Called and spoke with patient regarding his lab results.

## 2020-07-26 DIAGNOSIS — H9202 Otalgia, left ear: Secondary | ICD-10-CM | POA: Diagnosis not present

## 2020-08-04 DIAGNOSIS — M5030 Other cervical disc degeneration, unspecified cervical region: Secondary | ICD-10-CM | POA: Diagnosis not present

## 2020-08-04 DIAGNOSIS — Z981 Arthrodesis status: Secondary | ICD-10-CM | POA: Diagnosis not present

## 2020-08-04 DIAGNOSIS — M5417 Radiculopathy, lumbosacral region: Secondary | ICD-10-CM | POA: Diagnosis not present

## 2020-08-04 DIAGNOSIS — M5412 Radiculopathy, cervical region: Secondary | ICD-10-CM | POA: Diagnosis not present

## 2020-08-05 ENCOUNTER — Other Ambulatory Visit: Payer: Self-pay

## 2020-08-05 MED ORDER — PROPRANOLOL HCL 20 MG PO TABS: 20.0000 mg | ORAL_TABLET | Freq: Two times a day (BID) | ORAL | 3 refills | Status: DC

## 2020-08-06 ENCOUNTER — Ambulatory Visit (INDEPENDENT_AMBULATORY_CARE_PROVIDER_SITE_OTHER): Payer: Medicare Other | Admitting: Podiatry

## 2020-08-06 ENCOUNTER — Ambulatory Visit (INDEPENDENT_AMBULATORY_CARE_PROVIDER_SITE_OTHER): Payer: Medicare Other

## 2020-08-06 ENCOUNTER — Other Ambulatory Visit: Payer: Self-pay | Admitting: Podiatry

## 2020-08-06 ENCOUNTER — Other Ambulatory Visit: Payer: Self-pay

## 2020-08-06 ENCOUNTER — Encounter: Payer: Self-pay | Admitting: Podiatry

## 2020-08-06 DIAGNOSIS — M79672 Pain in left foot: Secondary | ICD-10-CM | POA: Diagnosis not present

## 2020-08-06 DIAGNOSIS — R194 Change in bowel habit: Secondary | ICD-10-CM | POA: Insufficient documentation

## 2020-08-06 DIAGNOSIS — M7672 Peroneal tendinitis, left leg: Secondary | ICD-10-CM

## 2020-08-10 DIAGNOSIS — M7672 Peroneal tendinitis, left leg: Secondary | ICD-10-CM | POA: Diagnosis not present

## 2020-08-10 DIAGNOSIS — M79672 Pain in left foot: Secondary | ICD-10-CM | POA: Diagnosis not present

## 2020-08-10 MED ORDER — TRIAMCINOLONE ACETONIDE 10 MG/ML IJ SUSP
10.0000 mg | Freq: Once | INTRAMUSCULAR | Status: AC
  Administered 2020-08-10: 10 mg

## 2020-08-10 NOTE — Progress Notes (Signed)
Subjective:   Patient ID: [name], male   DOB: 75 y.o.   MRN: [id number]   HPI Patient presents with a lot of pain in the outside of the left foot of approximate 5 weeks duration.  States it is aggravated by walking has had steroid but only lasted for short period of time.  Patient does not smoke likes to be active   Review of Systems  All other systems reviewed and are negative.       Objective:  Physical Exam Vitals and nursing note reviewed.  Constitutional:      Appearance: He is well-developed.  Pulmonary:     Effort: Pulmonary effort is normal.  Musculoskeletal:        General: Normal range of motion.  Skin:    General: Skin is warm.  Neurological:     Mental Status: He is alert.     H&P done and today pulse was found to be within normal limits and neurological within normal limits both sharp dull and vibratory.  Patient is found to have quite a bit of discomfort in the outside of the left foot around the peroneal insertion with inflammation fluid buildup and had normal digital muscle strength and MPJ motion along with subtalar and ankle motion.       Assessment:  Inflammatory peroneal tendinitis left with inflammation fluid buildup     Plan:  H&P reviewed condition sterile prep and injected the peroneal insertion base of fifth met at tarsal with 3 mg Dexasone Kenalog 5 mg Xylocaine applied fascial brace to lift up the lateral side of foot gave instructions for ice therapy anti-inflammatories reappoint to recheck  X-rays were negative for fracture and did not indicate any kind of bone condition associated with this problem

## 2020-08-18 DIAGNOSIS — H524 Presbyopia: Secondary | ICD-10-CM | POA: Diagnosis not present

## 2020-08-19 DIAGNOSIS — Z0100 Encounter for examination of eyes and vision without abnormal findings: Secondary | ICD-10-CM | POA: Diagnosis not present

## 2020-08-25 ENCOUNTER — Other Ambulatory Visit: Payer: Self-pay

## 2020-08-25 DIAGNOSIS — I10 Essential (primary) hypertension: Secondary | ICD-10-CM

## 2020-08-25 MED ORDER — DILTIAZEM HCL ER COATED BEADS 240 MG PO CP24: 240.0000 mg | ORAL_CAPSULE | Freq: Every day | ORAL | 3 refills | Status: DC

## 2020-09-07 DIAGNOSIS — M5417 Radiculopathy, lumbosacral region: Secondary | ICD-10-CM | POA: Diagnosis not present

## 2020-09-20 NOTE — Progress Notes (Signed)
Assessment/Plan:   1.  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  He really doesn't want to add more medication or change it right now.    -he asked about DBS and focused ultrasound.  Discussed those but he is no where close to needing those.  -Follows regularly with dermatology.  Understands that Parkinson's slightly increases risk of melanoma.  2.  Atrial fibrillation  -On Xarelto.  Follows with Dr. Einar Gip  3.  Insomnia  -Continue mirtazapine, 15 mg at bed (pt only taking 2-3 times per week prn)  4.  Back pain  -Follows with Dr. Arnoldo Morale on Friday.   Subjective:   [name] was seen today in follow up for Parkinsons disease.  My previous records were reviewed prior to todays visit as well as outside records available to me. Pt denies falls.  Pt denies lightheadedness, near syncope.  No hallucinations.  Mood has been good.  Started mirtazapine for insomnia last visit and reports that he takes it prn - about 3 days per week and it seems to work that way. has been in [place] for the last few months.  Did see Dr. Abigail Miyamoto practitioner since our last visit for his lumbar radiculopathy.  Had an injection and it helped for a week but back to baseline now.  He sees Dr. Arnoldo Morale on Friday.  Current prescribed movement disorder medications: Pramipexole 0.5 mg three times per day Mirtazapine, 15 mg at bed  PREVIOUS MEDICATIONS: Levodopa (patient thought he had chest pain and palpitations with it, but no longer there after was diagnosed with new onset A. fib)  ALLERGIES:   Allergies  Allergen Reactions  . Flecainide     Vomit   . Flexeril [Cyclobenzaprine]     Constipation  . Hctz [Hydrochlorothiazide]     hyponatremia  . Morphine And Related Hives    Hallucinations- can tolerate oxycodone.   . Promethazine     Would avoid  due to Parkinsons  . Sinemet [Carbidopa-Levodopa]     intolerant    CURRENT MEDICATIONS:  Outpatient Encounter Medications as of 09/22/2020  Medication Sig  . Cetirizine HCl (ALLERGY RELIEF) 10 MG CAPS Take 1 capsule by mouth daily as needed.  . diazepam (VALIUM) 5 MG tablet Take 5 mg by mouth daily as needed for anxiety.  Marland Kitchen diltiazem (CARDIZEM CD) 240 MG 24 hr capsule Take 1 capsule (240 mg total) by mouth daily.  . fluticasone (FLONASE) 50 MCG/ACT nasal spray Place 2 sprays into both nostrils daily.  Marland Kitchen ibuprofen (ADVIL) 200 MG tablet Take 1-2 tablets (200-400 mg total) by mouth 2 (two) times daily as needed (with food.).  Marland Kitchen lactose free nutrition (BOOST) LIQD Take 237 mLs by mouth daily.  . lansoprazole (PREVACID) 15 MG capsule Take by mouth.  . Menthol, Topical Analgesic, (BIOFREEZE EX) Apply 1 application topically daily as needed (pain).  . mirtazapine (REMERON) 15 MG tablet Take 1 tablet (15 mg total) by mouth at bedtime.  Marland Kitchen olmesartan (BENICAR) 20 MG tablet Take 1 tablet (20 mg total) by mouth daily.  Marland Kitchen omeprazole (PRILOSEC) 20 MG capsule Take 20 mg by mouth daily as needed (heart burn).  . Oxycodone HCl 10 MG TABS 1 tablet  . polyethylene glycol powder (GLYCOLAX/MIRALAX) 17 GM/SCOOP powder See admin instructions.  . pramipexole (MIRAPEX) 0.5 MG tablet TAKE 1 TABLET (0.5 MG TOTAL) BY MOUTH  3 (THREE) TIMES DAILY.  Marland Kitchen predniSONE (DELTASONE) 10 MG tablet 2 PO daily x 5 days then 1 PO daily x 5 days  . propranolol (INDERAL) 20 MG tablet Take 1 tablet (20 mg total) by mouth 2 (two) times daily.  Marland Kitchen pyridOXINE (VITAMIN B-6) 25 MG tablet Take 1 tablet (25 mg total) by mouth 3 (three) times daily as needed (nausea).  . traMADol (ULTRAM) 50 MG tablet TAKE 1 TABLET (50 MG TOTAL) BY MOUTH EVERY 12 (TWELVE) HOURS AS NEEDED.  Marland Kitchen zinc gluconate 50 MG tablet as needed.  . zolpidem (AMBIEN) 5 MG tablet Take 1 tablet (5 mg total) by mouth at bedtime as needed for sleep.  Marland Kitchen diltiazem (TIAZAC) 180 MG 24  hr capsule Take by mouth. (Patient not taking: Reported on 09/22/2020)   No facility-administered encounter medications on file as of 09/22/2020.    Objective:   PHYSICAL EXAMINATION:    VITALS:   Vitals:   09/22/20 0856  BP: 132/72  Pulse: 72  SpO2: 99%  Weight: 215 lb (97.5 kg)  Height: 6\' 3"  (1.905 m)    GEN:  The patient appears stated age and is in NAD. HEENT:  Normocephalic, atraumatic.  The mucous membranes are moist. The superficial temporal arteries are without ropiness or tenderness. CV:  RRR Lungs:  CTAB Neck/HEME:  There are no carotid bruits bilaterally.  Neurological examination:  Orientation: The patient is alert and oriented x3. Cranial nerves: There is good facial symmetry with facial hypomimia. The speech is fluent and clear. Soft palate rises symmetrically and there is no tongue deviation. Hearing is intact to conversational tone. Sensation: Sensation is intact to light touch throughout Motor: Strength is at least antigravity x4.  Movement examination: Tone: There is nl tone in the UE/LE Abnormal movements: occ tremor in the [place] (same as previous) Coordination:  There is mild decremation with RAM's, with foot taps bilaterally, R>L (same as previous) Gait and Station: The patient has no difficulty arising out of a deep-seated chair without the use of the hands. The patient's stride length is good with forward flexion  I have reviewed and interpreted the following labs independently    Chemistry      Component Value Date/Time   NA 139 07/01/2020 1021   K 4.7 07/01/2020 1021   CL 102 07/01/2020 1021   CO2 26 07/01/2020 1021   BUN 12 07/01/2020 1021   CREATININE 1.20 07/01/2020 1021   CREATININE 1.32 (H) 01/14/2019 0958      Component Value Date/Time   CALCIUM 9.1 07/01/2020 1021   ALKPHOS 63 03/23/2020 0924   AST 16 03/23/2020 0924   ALT 16 03/23/2020 0924   BILITOT 0.7 03/23/2020 0924       Lab Results  Component Value Date   WBC 6.0  03/23/2020   HGB 13.9 03/23/2020   HCT 41.0 03/23/2020   MCV 89.4 03/23/2020   PLT 228.0 03/23/2020    Lab Results  Component Value Date   TSH 5.45 (H) 03/23/2020     Total time spent on today's visit was 20 minutes, including both face-to-face time and nonface-to-face time.  Time included that spent on review of records (prior notes available to me/labs/imaging if pertinent), discussing treatment and goals, answering patient's questions and coordinating care.  Cc:  Tonia Ghent, MD

## 2020-09-22 ENCOUNTER — Other Ambulatory Visit: Payer: Self-pay

## 2020-09-22 ENCOUNTER — Ambulatory Visit: Payer: Medicare Other | Admitting: Neurology

## 2020-09-22 ENCOUNTER — Encounter: Payer: Self-pay | Admitting: Neurology

## 2020-09-22 VITALS — BP 132/72 | HR 72 | Ht 75.0 in | Wt 215.0 lb

## 2020-09-22 DIAGNOSIS — G20 Parkinson's disease: Secondary | ICD-10-CM

## 2020-09-22 NOTE — Patient Instructions (Signed)
Online Resources for Power over Parkinson's Group May 2022  . Local [place] Online Groups  o Power over Parkinson's Group :    - Upcoming Power over Parkinson's Meetings:  2nd Wednesdays of the month at 2 pm:  June 8th, July 13th - Contact Amy Marriott at [email or web address] if interested in participating in this group o Parkinson's Care Partners Group:    3rd Mondays, Contact Misty Paladino o Atypical Parkinsonian Patient Group:   4th Wednesdays, Contact Misty Paladino o If you are interested in participating in these groups with Misty, please contact her directly for how to join those meetings.  Her contact information is [email or web address].   . Parkinson Foundation:  www.parkinson.org o PD Health at Home continues:  Mindfulness Mondays, Expert Briefing Tuesdays, Wellness Wednesdays, Take Time Thursdays, Fitness Fridays o Register for expert briefings (webinars) at [email or web address] o  Please check out their website to sign up for emails and see their full online offerings  . [employer] Foundation:  www.[email or web address]  o Check out additional information on their website to see their full online offerings  . [employer]:  www.davisphinneyfoundation.org o Upcoming Webinar:  Stay tuned o Care Partner Monthly Meetup.  With Connie Carpenter Phinney.  First Tuesday of each month, 2 pm o Joy Breaks:  First Wednesday of each month, 2-3 pm. There will be art, doodling, making, crafting, listening, laughing, stories, and everything in between. No art experience necessary. No supplies required. Just show up for joy!  Register on their website. o Check out additional information to Live Well Today on their website  . Parkinson and Movement Disorders (PMD) Alliance:  www.pmdalliance.org o [employer]:  Online Education Events o Sign up for emails, which are sent weekly to give you updates on programming and online  offerings     . Parkinson's Association of [place]:  www.parkinsonassociation.org o Information on online support groups, education events, and online exercises including Yoga, Parkinson's exercises and more-LOTS of information on links to PD resources and online events o Virtual Support Group through Parkinson's Association of [place]; next one is scheduled for Wednesday, May 4th, 2022 at 2 pm. (These are typically scheduled for the 1st Wednesday of the month at 2 pm).  Visit website for details.  . Additional links for movement activities: o PWR! Moves Classes at [facility] Exercise Room HAVE RESUMED!  Wednesdays 10 and 11 am.  Contact Amy Marriott, PT [email or web address] or [phone] if interested o Here is a link to the PWR!Moves classes on Zoom from [place] Parkinson's [facility] - Daily Mon-Sat at 10:00. Via Zoom, FREE and open to all.  There is also a link below via Facebook if you use that platform. - https://www.[email or web address].org/mpf-programs/exercise-and-movement-activities - https://www.facebook.com/ParkinsonsMI.org/posts/pwr-moves-exercise-class-parkinson-wellness-recovery-online-with-angee-ludwa-pt-/10156827878021813/ o Parkinson's Wellness Recovery (PWR! Moves)  www.pwr4life.org - Info on the PWR! Virtual Experience:  You will have access to our expertise through self-assessment, guided plans that start with the PD-specific fundamentals, educational content, tips, Q&A with an expert, and a growing library of PD-specific pre-recorded and live exercise classes of varying types and intensity - both physical and cognitive! If that is not enough, we offer 1:1 wellness consultations (in-person or virtual) to personalize your PWR! Virtual Experience.  - Check out the PWR! Move of the month on the Parkinson Wellness Recovery website:  https://www.pwr4life.org/pwr-move-of-the-month-4/ o Parkinson Foundation Fitness Fridays:  - As part of the PD Health @ Home program,  this free video series focuses each week on one aspect of fitness designed to support people living with Parkinson's.    These weekly videos highlight the Parkinson Foundation recent fitness guidelines for people with Parkinson's disease. -  www.parkinson.org/understanding-parkinsons/coronavirus/PD-health-at-home/Fitness-Fridays o Dance for PD website is offering free, live-stream classes throughout the week, as well as links to digital library of classes:  https://danceforparkinsons.[email or web address]/ o Dance for Parkinson's Class:  [employer] [place].  Free offering for people with Parkinson's and care partners; virtual class.  o For more information, contact [phone] or email Magalli Morana at [email or web address] o Virtual dance and Pilates for Parkinson's classes: Click on the Community Tab> Parkinson's Movement Initiative Tab.  To register for classes and for more information, visit www.americandancefestival.org and click the "community" tab.     o [employer] Parkinson's Cycling Classes  - [employer]: 1pm on Fridays-Live classes at [employer] (Contact Margaret Hazen at [email or web address] or [phone]) - [facility]: Virtual Classes Mondays and Thursdays /Live classes Tuesday, Wednesday and Thursday (contact Marlee at [email or web address]  or [phone])  o [place] [employer] - Three levels of classes are offered Tuesdays and Thursdays:  10:30 am,  12 noon & 1:45 pm at [facility] [facility].  - Active Stretching with Maria, New Class starting in March, on Fridays - To observe a class or for  more information, call [phone] or email [email or web address] . [employer]: o Online Caregiver Education Opportunities:  www.well-springsolutions.org/caregiver-education/caregiver-support-group.  You may also contact Jodi Kolada at [email or web address] or [phone].   o [facility] Navigator:  Just1Navigator program, a free service  to help individuals and families through the journey of determining care for older adults.  The "Navigator" is a social worker, Nicole Reynolds, who will speak with a prospective client and/or loved ones to provide an assessment of the situation and a set of recommendations for a personalized care plan -- all free of charge, and whether [facility] [employer] offers the needed service or not. If the need is not a service we provide, we are well-connected with reputable programs in town that we can refer you to.  www.well-springsolutions.org or to speak with the Navigator, call [phone].     

## 2020-09-23 DIAGNOSIS — L97924 Non-pressure chronic ulcer of unspecified part of left lower leg with necrosis of bone: Secondary | ICD-10-CM | POA: Diagnosis not present

## 2020-09-24 ENCOUNTER — Other Ambulatory Visit: Payer: Self-pay | Admitting: Plastic Surgery

## 2020-09-24 ENCOUNTER — Ambulatory Visit: Payer: PPO | Admitting: Neurology

## 2020-09-24 DIAGNOSIS — I10 Essential (primary) hypertension: Secondary | ICD-10-CM | POA: Diagnosis not present

## 2020-09-24 DIAGNOSIS — L97924 Non-pressure chronic ulcer of unspecified part of left lower leg with necrosis of bone: Secondary | ICD-10-CM

## 2020-09-24 DIAGNOSIS — Z6826 Body mass index (BMI) 26.0-26.9, adult: Secondary | ICD-10-CM | POA: Diagnosis not present

## 2020-09-24 DIAGNOSIS — M545 Low back pain, unspecified: Secondary | ICD-10-CM | POA: Diagnosis not present

## 2020-09-24 DIAGNOSIS — G8929 Other chronic pain: Secondary | ICD-10-CM | POA: Diagnosis not present

## 2020-10-06 DIAGNOSIS — I480 Paroxysmal atrial fibrillation: Secondary | ICD-10-CM | POA: Diagnosis not present

## 2020-10-06 DIAGNOSIS — I4510 Unspecified right bundle-branch block: Secondary | ICD-10-CM | POA: Diagnosis not present

## 2020-10-06 DIAGNOSIS — I10 Essential (primary) hypertension: Secondary | ICD-10-CM | POA: Diagnosis not present

## 2020-10-08 ENCOUNTER — Encounter: Payer: Self-pay | Admitting: Cardiology

## 2020-10-08 ENCOUNTER — Ambulatory Visit: Payer: Medicare Other | Admitting: Cardiology

## 2020-10-08 ENCOUNTER — Ambulatory Visit (HOSPITAL_BASED_OUTPATIENT_CLINIC_OR_DEPARTMENT_OTHER)
Admission: RE | Admit: 2020-10-08 | Discharge: 2020-10-08 | Disposition: A | Discharge status: Home / Self Care | Payer: Medicare Other | Source: Ambulatory Visit | Attending: Plastic Surgery | Admitting: Plastic Surgery

## 2020-10-08 ENCOUNTER — Other Ambulatory Visit: Payer: Self-pay

## 2020-10-08 VITALS — BP 144/82 | HR 74 | Temp 98.6°F | Resp 16 | Ht 75.0 in | Wt 216.2 lb

## 2020-10-08 DIAGNOSIS — L97924 Non-pressure chronic ulcer of unspecified part of left lower leg with necrosis of bone: Secondary | ICD-10-CM | POA: Diagnosis not present

## 2020-10-08 DIAGNOSIS — I10 Essential (primary) hypertension: Secondary | ICD-10-CM

## 2020-10-08 DIAGNOSIS — I4510 Unspecified right bundle-branch block: Secondary | ICD-10-CM

## 2020-10-08 DIAGNOSIS — S81802A Unspecified open wound, left lower leg, initial encounter: Secondary | ICD-10-CM | POA: Diagnosis not present

## 2020-10-08 DIAGNOSIS — I480 Paroxysmal atrial fibrillation: Secondary | ICD-10-CM

## 2020-10-08 NOTE — Progress Notes (Signed)
Primary Physician/Referring:  Tonia Ghent, MD  Patient ID: [name], male    DOB: 07-28-1945, 75 y.o.   MRN: [id number]  Chief Complaint  Patient presents with   Atrial Fibrillation   Hypertension   Follow-up    1 year   Dizziness   HPI:    [name]  is a 75 y.o. 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 fib with RVR. Echocardiogram revealed normal LVEF.  No wall motion abnormalities.   He was discharged from ED on flecainide and propanolol as well as Xarelto for anticoagulation, he converted to sinus rhythm upon discharge. Patient had previously had A fib in Dec 2020 after back surgery and spontaneously converted to sinus.   Anticoagulation was continued by shared decision with patient and Dr. Einar Gip on 09/10/2019 as he had had no recurrence of atrial fibrillation.  Flecainide was also discontinued as patient did not tolerate this due to severe fatigue.  Patient now presents for follow-up of atrial fibrillation, hypertension.  Patient had called the office approximately 2 months ago with concerns of leg swelling and elevated blood pressure.  At that time was advised to start olmesartan 20 mg once daily.  However he was unable to tolerate this due to hypotension and dizziness therefore patient discontinued olmesartan.  He is only been taking diltiazem 240 mg once daily and propranolol 20 mg once daily from a cardiovascular standpoint.  Patient reports overall he is feeling well with the exception of back pain which limits his walking.  He has had no known recurrence of atrial fibrillation.  He rides a bike for approximately 20 miles per day without issue.  He monitors his blood pressure on a regular basis at home with readings averaging 130/74 mmHg according to patient.  He denies chest pain, dyspnea, dizziness, syncope, near syncope, palpitations.  Denies  symptoms suggestive of TIA/CVA.  Past Medical History:  Diagnosis Date   Arthritis    GERD (gastroesophageal reflux disease)    H/O urinary frequency    Heart murmur    as a child   History of bronchitis    HSV infection    oral   Hypertension    improved after back pain treated with surgery   Insomnia    Insomnia    Improved with Ambien, did not tolerate melatonin.   Joint pain    OSA on CPAP    he ordered kit and started use 2017 w/o formal testing, see note from 11/30/15.    Parkinson disease ([place])    Vertigo 10/13/2014   recurrent; trigger is cool air on left ear, improved with daily antihistamine   Past Surgical History:  Procedure Laterality Date   ACHILLES TENDON REPAIR Left    BACK SURGERY     COLONOSCOPY  02/2011   Negative,[facility]   DEBRIDEMENT AND CLOSURE WOUND Left 02/25/2018   Procedure: DEBRIDEMENT LEFT LEG APPLICATION INTEGRA;  Surgeon: Irene Limbo, MD;  Location: [facility];  Service: Plastics;  Laterality: Left;   EYE SURGERY Bilateral    cataracts   KNEE ARTHROSCOPY     L   LUMBAR FUSION     2016   LUMBAR SPINE SURGERY  03/31/2019   exploration of lumbar fusion w/L5-S1 decompression redo L4-5 fusion   SPINE SURGERY  2003   cervical fusion   TONSILLECTOMY     varicoelectomy  Family History  Problem Relation Age of Onset   Hypertension Mother    Asthma Mother    Diabetes Father    Stroke Father    Heart attack Paternal Grandfather        >55   Cancer Sister    Colon cancer Neg Hx    Prostate cancer Neg Hx     Social History   Tobacco Use   Smoking status: Never   Smokeless tobacco: Never  Substance Use Topics   Alcohol use: No   Marital Status: Married ROS  Review of Systems  Cardiovascular:  Negative for chest pain, claudication, dyspnea on exertion, leg swelling, near-syncope, orthopnea, palpitations and syncope.  Musculoskeletal:  Positive for arthritis and back pain.  Gastrointestinal:   Negative for hematochezia and melena.  Neurological:  Positive for tremors. Negative for headaches and light-headedness.  Objective  Blood pressure (!) 144/82, pulse 74, temperature 98.6 F (37 C), temperature source Temporal, resp. rate 16, height _0  (1.905 m), weight 216 lb 3.2 oz (98.1 kg), SpO2 96 %.  Vitals with BMI 10/08/2020 10/08/2020 09/22/2020  Height - _1  _2   Weight - 216 lbs 3 oz 215 lbs  BMI - 37.90 24.09  Systolic 735 329 924  Diastolic 82 88 72  Pulse - 74 72   Orthostatic VS for the past 72 hrs (Last 3 readings):  Orthostatic BP Patient Position BP Location Cuff Size Orthostatic Pulse  10/08/20 1042 -- Sitting Left Arm Normal --  10/08/20 1023 151/88 Standing Left Arm Normal 68  10/08/20 1022 140/90 Sitting Left Arm Normal 66  10/08/20 1021 134/88 Supine Left Arm Normal 57    Physical Exam Vitals reviewed.  Neck:     Thyroid: No thyromegaly.  Cardiovascular:     Rate and Rhythm: Normal rate and regular rhythm.     Pulses: Intact distal pulses.     Heart sounds: Normal heart sounds, S1 normal and S2 normal. No murmur heard.   No gallop.     Comments: No leg edema, no JVD. Pulmonary:     Effort: Pulmonary effort is normal. No respiratory distress.     Breath sounds: Normal breath sounds. No wheezing, rhonchi or rales.  Abdominal:     General: Bowel sounds are normal.     Palpations: Abdomen is soft.  Musculoskeletal:     Cervical back: Neck supple.     Right lower leg: No edema.     Left lower leg: No edema.  Skin:    General: Skin is warm and dry.  Neurological:     Mental Status: He is alert.   Laboratory examination:   Recent Labs    11/19/19 0848 03/23/20 0924 07/01/20 1021  NA 136 137 139  K 4.1 4.6 4.7  CL 101 100 102  CO2 _3 GLUCOSE 88 89 91  BUN _4 CREATININE 1.26 1.29 1.20  CALCIUM 9.3 9.3 9.1   CrCl cannot be calculated (Patient's most recent lab result is older than the maximum 21 days allowed.).  CMP Latest Ref  Rng & Units 07/01/2020 03/23/2020 11/19/2019  Glucose 65 - 99 mg/dL 91 89 88  BUN 8 - 27 mg/dL _5 Creatinine 0.76 - 1.27 mg/dL 1.20 1.29 1.26  Sodium 134 - 144 mmol/L 139 137 136  Potassium 3.5 - 5.2 mmol/L 4.7 4.6 4.1  Chloride 96 - 106 mmol/L 102 100 101  CO2 20 - 29 mmol/L _6 Calcium  8.6 - 10.2 mg/dL 9.1 9.3 9.3  Total Protein 6.0 - 8.3 g/dL - 6.5 -  Total Bilirubin 0.2 - 1.2 mg/dL - 0.7 -  Alkaline Phos 39 - 117 U/L - 63 -  AST 0 - 37 U/L - 16 -  ALT 0 - 53 U/L - 16 -   CBC Latest Ref Rng & Units 03/23/2020 05/26/2019 05/17/2019  WBC 4.0 - 10.5 K/uL 6.0 8.9 7.0  Hemoglobin 13.0 - 17.0 g/dL 13.9 13.3 13.3  Hematocrit 39.0 - 52.0 % 41.0 39.1 36.4(L)  Platelets 150.0 - 400.0 K/uL 228.0 335.0 197   Lipid Panel     Component Value Date/Time   CHOL 151 03/23/2020 0924   TRIG 117.0 03/23/2020 0924   HDL 32.80 (L) 03/23/2020 0924   CHOLHDL 5 03/23/2020 0924   VLDL 23.4 03/23/2020 0924   LDLCALC 95 03/23/2020 0924   HEMOGLOBIN A1C No results found for: HGBA1C, MPG TSH Recent Labs    03/23/20 0924  TSH 5.45*   Allergies   Allergies  Allergen Reactions   Flecainide     Vomit    Flexeril [Cyclobenzaprine]     Constipation   Hctz [Hydrochlorothiazide]     hyponatremia   Morphine And Related Hives    Hallucinations- can tolerate oxycodone.    Promethazine     Would avoid due to Parkinsons   Sinemet [Carbidopa-Levodopa]     intolerant      Medications Prior to Visit:   Outpatient Medications Prior to Visit  Medication Sig Dispense Refill   Cetirizine HCl (ALLERGY RELIEF) 10 MG CAPS Take 1 capsule by mouth daily as needed.     diazepam (VALIUM) 5 MG tablet Take 5 mg by mouth daily as needed for anxiety.     diltiazem (CARDIZEM CD) 240 MG 24 hr capsule Take 1 capsule (240 mg total) by mouth daily. 30 capsule 3   fluticasone (FLONASE) 50 MCG/ACT nasal spray Place 2 sprays into both nostrils daily.     ibuprofen (ADVIL) 200 MG tablet Take 1-2 tablets (200-400  mg total) by mouth 2 (two) times daily as needed (with food.).     lansoprazole (PREVACID) 15 MG capsule Take by mouth.     Menthol, Topical Analgesic, (BIOFREEZE EX) Apply 1 application topically daily as needed (pain).     omeprazole (PRILOSEC) 20 MG capsule Take 20 mg by mouth daily as needed (heart burn).     Oxycodone HCl 10 MG TABS 1 tablet     polyethylene glycol powder (GLYCOLAX/MIRALAX) 17 GM/SCOOP powder See admin instructions.     pramipexole (MIRAPEX) 0.5 MG tablet TAKE 1 TABLET (0.5 MG TOTAL) BY MOUTH 3 (THREE) TIMES DAILY. 270 tablet 1   propranolol (INDERAL) 20 MG tablet Take 1 tablet (20 mg total) by mouth 2 (two) times daily. 180 tablet 3   traMADol (ULTRAM) 50 MG tablet TAKE 1 TABLET (50 MG TOTAL) BY MOUTH EVERY 12 (TWELVE) HOURS AS NEEDED. 40 tablet 0   zinc gluconate 50 MG tablet as needed.     zolpidem (AMBIEN) 5 MG tablet Take 1 tablet (5 mg total) by mouth at bedtime as needed for sleep. 30 tablet 1   olmesartan (BENICAR) 20 MG tablet Take 1 tablet (20 mg total) by mouth daily. 30 tablet 3   diltiazem (TIAZAC) 180 MG 24 hr capsule Take by mouth. (Patient not taking: Reported on 09/22/2020)     lactose free nutrition (BOOST) LIQD Take 237 mLs by mouth daily.     mirtazapine (REMERON)  15 MG tablet Take 1 tablet (15 mg total) by mouth at bedtime. 90 tablet 1   predniSONE (DELTASONE) 10 MG tablet 2 PO daily x 5 days then 1 PO daily x 5 days     pyridOXINE (VITAMIN B-6) 25 MG tablet Take 1 tablet (25 mg total) by mouth 3 (three) times daily as needed (nausea).     No facility-administered medications prior to visit.     Final Medications at End of Visit    Current Meds  Medication Sig   Cetirizine HCl (ALLERGY RELIEF) 10 MG CAPS Take 1 capsule by mouth daily as needed.   diazepam (VALIUM) 5 MG tablet Take 5 mg by mouth daily as needed for anxiety.   diltiazem (CARDIZEM CD) 240 MG 24 hr capsule Take 1 capsule (240 mg total) by mouth daily.   fluticasone (FLONASE) 50  MCG/ACT nasal spray Place 2 sprays into both nostrils daily.   ibuprofen (ADVIL) 200 MG tablet Take 1-2 tablets (200-400 mg total) by mouth 2 (two) times daily as needed (with food.).   lansoprazole (PREVACID) 15 MG capsule Take by mouth.   Menthol, Topical Analgesic, (BIOFREEZE EX) Apply 1 application topically daily as needed (pain).   omeprazole (PRILOSEC) 20 MG capsule Take 20 mg by mouth daily as needed (heart burn).   Oxycodone HCl 10 MG TABS 1 tablet   polyethylene glycol powder (GLYCOLAX/MIRALAX) 17 GM/SCOOP powder See admin instructions.   pramipexole (MIRAPEX) 0.5 MG tablet TAKE 1 TABLET (0.5 MG TOTAL) BY MOUTH 3 (THREE) TIMES DAILY.   propranolol (INDERAL) 20 MG tablet Take 1 tablet (20 mg total) by mouth 2 (two) times daily.   traMADol (ULTRAM) 50 MG tablet TAKE 1 TABLET (50 MG TOTAL) BY MOUTH EVERY 12 (TWELVE) HOURS AS NEEDED.   zinc gluconate 50 MG tablet as needed.   zolpidem (AMBIEN) 5 MG tablet Take 1 tablet (5 mg total) by mouth at bedtime as needed for sleep.   [DISCONTINUED] olmesartan (BENICAR) 20 MG tablet Take 1 tablet (20 mg total) by mouth daily.   Radiology:   Chest x-ray portable 04/24/2019: Mediastinum and hilar structures normal. Heart size normal. Lungs are clear. No pleural effusion or pneumothorax. Prior cervical spine fusion. Degenerative change thoracic spine. IMPRESSION: No acute cardiopulmonary disease.  Cardiac Studies:   Echocardiogram 04/25/2019:  Normal LV systolic function, EF 60 to 65%.  No other significant valvular abnormalities.  Essentially normal echocardiogram.  Left atrial size is normal.   EKG   10/08/2020: Sinus rhythm at a rate of 62 bpm.  Biatrial enlargement.  Normal axis.  Right bundle branch block.  Compared to EKG 05/09/2019, no significant change.  04/24/2019: Atrial flutter with 2: 1 conduction at the rate of 147 bpm. Rightward axis. Right bundle branch block. No evidence of ischemia. Compared to the study done on 03/27/2019,  sinus rhythm is now replaced by atrial flutter.  EKG 04/24/2019: A. Fibrillation with rapid response rate 147/minute, normal axis, no acute ischemia.  Assessment     ICD-10-CM   1. Essential hypertension  I10 EKG 12-Lead    2. Paroxysmal atrial fibrillation ([place]). CH22DS2-VASc Score is 2.  Yearly risk of stroke: 2.3% (A, HTN).  I48.0     3. RBBB  I45.10        No orders of the defined types were placed in this encounter.   Medications Discontinued During This Encounter  Medication Reason   mirtazapine (REMERON) 15 MG tablet Error   lactose free nutrition (BOOST) LIQD Error  predniSONE (DELTASONE) 10 MG tablet Error   pyridOXINE (VITAMIN B-6) 25 MG tablet Error   diltiazem (TIAZAC) 180 MG 24 hr capsule Dose change   olmesartan (BENICAR) 20 MG tablet Side effect (s)          Recommendations:   QUADRY KAMPA  is a 75 y.o. 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 fib with RVR. Echocardiogram revealed normal LVEF.  No wall motion abnormalities.   He was discharged from ED on flecainide and propanolol as well as Xarelto for anticoagulation, he converted to sinus rhythm upon discharge. Patient had previously had A fib in Dec 2020 after back surgery and spontaneously converted to sinus.   Anticoagulation was continued by shared decision with patient and Dr. Einar Gip on 09/10/2019 as he had had no recurrence of atrial fibrillation.  Flecainide was also discontinued as patient did not tolerate this due to severe fatigue. If patient does have recurrence of atrial fibrillation could consider ablation versus different antiarrhythmic agent as he did not tolerate flecainide.  Patient now presents for follow-up of atrial fibrillation, hypertension.  She is feeling well overall without specific complaints today.  He has had no known recurrence of atrial fibrillation.  The decision  was to continue to avoid antiarrhythmic therapy as well as anticoagulation.  Lipids are well controlled.  In regard to hypertension, office blood pressure is uncontrolled however home readings are under excellent control.  Patient states that he was stung by a wasp yesterday which continues to bother him.  Shared decision was to hold off on additional antihypertensive medications although could consider reinitiation of low-dose ARB in the future.  Patient will instead monitor his blood pressure on a daily basis at home and notify our office if it is >130/80 mmHg.  Patient verbalized understanding agreement.  Follow-up in 1 year, sooner if needed, for hypertension, history of paroxysmal atrial fibrillation, and hyperlipidemia.   Alethia Berthold, PA-C 10/08/2020, 12:35 PM Office: [phone]

## 2020-10-11 DIAGNOSIS — M5412 Radiculopathy, cervical region: Secondary | ICD-10-CM | POA: Diagnosis not present

## 2020-11-01 DIAGNOSIS — L089 Local infection of the skin and subcutaneous tissue, unspecified: Secondary | ICD-10-CM | POA: Diagnosis not present

## 2020-11-01 DIAGNOSIS — W57XXXA Bitten or stung by nonvenomous insect and other nonvenomous arthropods, initial encounter: Secondary | ICD-10-CM | POA: Diagnosis not present

## 2020-11-01 DIAGNOSIS — S80862A Insect bite (nonvenomous), left lower leg, initial encounter: Secondary | ICD-10-CM | POA: Diagnosis not present

## 2020-11-09 ENCOUNTER — Other Ambulatory Visit: Payer: Self-pay

## 2020-11-09 ENCOUNTER — Encounter (HOSPITAL_BASED_OUTPATIENT_CLINIC_OR_DEPARTMENT_OTHER): Payer: Self-pay | Admitting: Plastic Surgery

## 2020-11-10 DIAGNOSIS — M5030 Other cervical disc degeneration, unspecified cervical region: Secondary | ICD-10-CM | POA: Diagnosis not present

## 2020-11-10 DIAGNOSIS — M4316 Spondylolisthesis, lumbar region: Secondary | ICD-10-CM | POA: Diagnosis not present

## 2020-11-10 DIAGNOSIS — Z981 Arthrodesis status: Secondary | ICD-10-CM | POA: Diagnosis not present

## 2020-11-12 ENCOUNTER — Encounter (HOSPITAL_BASED_OUTPATIENT_CLINIC_OR_DEPARTMENT_OTHER): Payer: Self-pay | Admitting: Plastic Surgery

## 2020-11-12 ENCOUNTER — Other Ambulatory Visit: Payer: Self-pay

## 2020-11-12 ENCOUNTER — Encounter (HOSPITAL_BASED_OUTPATIENT_CLINIC_OR_DEPARTMENT_OTHER)
Admission: RE | Disposition: A | Discharge status: Home / Self Care | Payer: Self-pay | Source: Ambulatory Visit | Attending: Plastic Surgery

## 2020-11-12 ENCOUNTER — Ambulatory Visit (HOSPITAL_BASED_OUTPATIENT_CLINIC_OR_DEPARTMENT_OTHER)
Admission: RE | Admit: 2020-11-12 | Discharge: 2020-11-12 | Disposition: A | Discharge status: Home / Self Care | Payer: Medicare Other | Source: Ambulatory Visit | Attending: Plastic Surgery | Admitting: Plastic Surgery

## 2020-11-12 ENCOUNTER — Ambulatory Visit (HOSPITAL_BASED_OUTPATIENT_CLINIC_OR_DEPARTMENT_OTHER): Payer: Medicare Other | Admitting: Certified Registered"

## 2020-11-12 DIAGNOSIS — L97226 Non-pressure chronic ulcer of left calf with bone involvement without evidence of necrosis: Secondary | ICD-10-CM | POA: Diagnosis not present

## 2020-11-12 DIAGNOSIS — L97826 Non-pressure chronic ulcer of other part of left lower leg with bone involvement without evidence of necrosis: Secondary | ICD-10-CM | POA: Diagnosis not present

## 2020-11-12 DIAGNOSIS — L97924 Non-pressure chronic ulcer of unspecified part of left lower leg with necrosis of bone: Secondary | ICD-10-CM | POA: Diagnosis not present

## 2020-11-12 DIAGNOSIS — I4510 Unspecified right bundle-branch block: Secondary | ICD-10-CM | POA: Diagnosis not present

## 2020-11-12 DIAGNOSIS — M87862 Other osteonecrosis, left tibia: Secondary | ICD-10-CM | POA: Diagnosis not present

## 2020-11-12 DIAGNOSIS — G4733 Obstructive sleep apnea (adult) (pediatric): Secondary | ICD-10-CM | POA: Diagnosis not present

## 2020-11-12 DIAGNOSIS — M86162 Other acute osteomyelitis, left tibia and fibula: Secondary | ICD-10-CM | POA: Diagnosis not present

## 2020-11-12 DIAGNOSIS — G20 Parkinson's disease: Secondary | ICD-10-CM | POA: Insufficient documentation

## 2020-11-12 DIAGNOSIS — Z9989 Dependence on other enabling machines and devices: Secondary | ICD-10-CM | POA: Diagnosis not present

## 2020-11-12 HISTORY — DX: Sleep apnea, unspecified: G47.30

## 2020-11-12 HISTORY — PX: INCISION AND DRAINAGE OF WOUND: SHX1803

## 2020-11-12 HISTORY — DX: Unspecified atrial fibrillation: I48.91

## 2020-11-12 HISTORY — PX: ALLOGRAFT APPLICATION: SHX6404

## 2020-11-12 SURGERY — IRRIGATION AND DEBRIDEMENT WOUND
Anesthesia: General | Site: Leg Lower | Laterality: Left

## 2020-11-12 MED ORDER — LIDOCAINE-EPINEPHRINE 1 %-1:100000 IJ SOLN
INTRAMUSCULAR | Status: DC | PRN
  Administered 2020-11-12: 6 mL

## 2020-11-12 MED ORDER — PROPOFOL 10 MG/ML IV BOLUS
INTRAVENOUS | Status: AC
  Filled 2020-11-12: qty 40

## 2020-11-12 MED ORDER — LIDOCAINE HCL (PF) 2 % IJ SOLN
INTRAMUSCULAR | Status: AC
  Filled 2020-11-12: qty 5

## 2020-11-12 MED ORDER — DEXAMETHASONE SODIUM PHOSPHATE 10 MG/ML IJ SOLN
INTRAMUSCULAR | Status: AC
  Filled 2020-11-12: qty 1

## 2020-11-12 MED ORDER — LACTATED RINGERS IV SOLN: INTRAVENOUS | Status: DC

## 2020-11-12 MED ORDER — FENTANYL CITRATE (PF) 100 MCG/2ML IJ SOLN
INTRAMUSCULAR | Status: DC | PRN
  Administered 2020-11-12: 100 ug via INTRAVENOUS

## 2020-11-12 MED ORDER — OXYCODONE HCL 5 MG/5ML PO SOLN
5.0000 mg | Freq: Once | ORAL | Status: DC | PRN
Start: 2020-11-12 — End: 2020-11-12

## 2020-11-12 MED ORDER — PROPOFOL 10 MG/ML IV BOLUS
INTRAVENOUS | Status: DC | PRN
  Administered 2020-11-12: 150 mg via INTRAVENOUS

## 2020-11-12 MED ORDER — OXYCODONE HCL 5 MG PO TABS: 5.0000 mg | ORAL_TABLET | Freq: Once | ORAL | Status: DC | PRN

## 2020-11-12 MED ORDER — LIDOCAINE-EPINEPHRINE 1 %-1:100000 IJ SOLN
INTRAMUSCULAR | Status: AC
  Filled 2020-11-12: qty 1

## 2020-11-12 MED ORDER — MEPERIDINE HCL 25 MG/ML IJ SOLN: 6.2500 mg | INTRAMUSCULAR | Status: DC | PRN

## 2020-11-12 MED ORDER — CEFAZOLIN SODIUM-DEXTROSE 2-4 GM/100ML-% IV SOLN
2.0000 g | INTRAVENOUS | Status: AC
  Administered 2020-11-12: 2 g via INTRAVENOUS

## 2020-11-12 MED ORDER — ONDANSETRON HCL 4 MG/2ML IJ SOLN
INTRAMUSCULAR | Status: AC
  Filled 2020-11-12: qty 2

## 2020-11-12 MED ORDER — PHENYLEPHRINE 40 MCG/ML (10ML) SYRINGE FOR IV PUSH (FOR BLOOD PRESSURE SUPPORT)
PREFILLED_SYRINGE | INTRAVENOUS | Status: AC
  Filled 2020-11-12: qty 10

## 2020-11-12 MED ORDER — FENTANYL CITRATE (PF) 100 MCG/2ML IJ SOLN: 25.0000 ug | INTRAMUSCULAR | Status: DC | PRN

## 2020-11-12 MED ORDER — ONDANSETRON HCL 4 MG/2ML IJ SOLN
INTRAMUSCULAR | Status: DC | PRN
  Administered 2020-11-12: 4 mg via INTRAVENOUS

## 2020-11-12 MED ORDER — ONDANSETRON HCL 4 MG/2ML IJ SOLN: 4.0000 mg | Freq: Once | INTRAMUSCULAR | Status: DC | PRN

## 2020-11-12 MED ORDER — CEFAZOLIN SODIUM-DEXTROSE 2-4 GM/100ML-% IV SOLN
INTRAVENOUS | Status: AC
  Filled 2020-11-12: qty 100

## 2020-11-12 MED ORDER — 0.9 % SODIUM CHLORIDE (POUR BTL) OPTIME
TOPICAL | Status: DC | PRN
  Administered 2020-11-12: 100 mL

## 2020-11-12 MED ORDER — ACETAMINOPHEN 325 MG PO TABS: 325.0000 mg | ORAL_TABLET | ORAL | Status: DC | PRN

## 2020-11-12 MED ORDER — FENTANYL CITRATE (PF) 100 MCG/2ML IJ SOLN
INTRAMUSCULAR | Status: AC
  Filled 2020-11-12: qty 2

## 2020-11-12 MED ORDER — PHENYLEPHRINE HCL (PRESSORS) 10 MG/ML IV SOLN
INTRAVENOUS | Status: DC | PRN
  Administered 2020-11-12 (×2): 80 ug via INTRAVENOUS

## 2020-11-12 MED ORDER — LIDOCAINE HCL (CARDIAC) PF 100 MG/5ML IV SOSY
PREFILLED_SYRINGE | INTRAVENOUS | Status: DC | PRN
  Administered 2020-11-12: 20 mg via INTRAVENOUS

## 2020-11-12 MED ORDER — ACETAMINOPHEN 160 MG/5ML PO SOLN: 325.0000 mg | ORAL | Status: DC | PRN

## 2020-11-12 SURGICAL SUPPLY — 87 items
ADH SKN CLS APL DERMABOND .7 (GAUZE/BANDAGES/DRESSINGS)
APL PRP STRL LF DISP 70% ISPRP (MISCELLANEOUS)
APL SKNCLS STERI-STRIP NONHPOA (GAUZE/BANDAGES/DRESSINGS)
BAG DECANTER FOR FLEXI CONT (MISCELLANEOUS) IMPLANT
BENZOIN TINCTURE PRP APPL 2/3 (GAUZE/BANDAGES/DRESSINGS) IMPLANT
BLADE CLIPPER SURG (BLADE) IMPLANT
BLADE HEX COATED 2.75 (ELECTRODE) ×3 IMPLANT
BLADE SURG 10 STRL SS (BLADE) IMPLANT
BLADE SURG 15 STRL LF DISP TIS (BLADE) ×2 IMPLANT
BLADE SURG 15 STRL SS (BLADE) ×3
BNDG ELASTIC 3X5.8 VLCR STR LF (GAUZE/BANDAGES/DRESSINGS) IMPLANT
BNDG ELASTIC 4X5.8 VLCR STR LF (GAUZE/BANDAGES/DRESSINGS) IMPLANT
BNDG ELASTIC 6X5.8 VLCR STR LF (GAUZE/BANDAGES/DRESSINGS) IMPLANT
BNDG GAUZE ELAST 4 BULKY (GAUZE/BANDAGES/DRESSINGS) IMPLANT
BUR EGG 3PK/BX (BURR) ×3 IMPLANT
CANISTER SUCT 1200ML W/VALVE (MISCELLANEOUS) ×3 IMPLANT
CHLORAPREP W/TINT 26 (MISCELLANEOUS) IMPLANT
CNTNR URN SCR LID CUP LEK RST (MISCELLANEOUS) ×4 IMPLANT
CONT SPEC 4OZ STRL OR WHT (MISCELLANEOUS) ×6
COVER BACK TABLE 60X90IN (DRAPES) ×3 IMPLANT
COVER MAYO STAND STRL (DRAPES) ×3 IMPLANT
DECANTER SPIKE VIAL GLASS SM (MISCELLANEOUS) IMPLANT
DERMABOND ADVANCED (GAUZE/BANDAGES/DRESSINGS)
DERMABOND ADVANCED .7 DNX12 (GAUZE/BANDAGES/DRESSINGS) IMPLANT
DRAIN CHANNEL 15F RND FF W/TCR (WOUND CARE) IMPLANT
DRAPE EXTREMITY T 121X128X90 (DISPOSABLE) ×3 IMPLANT
DRAPE INCISE IOBAN 66X45 STRL (DRAPES) IMPLANT
DRAPE LAPAROSCOPIC ABDOMINAL (DRAPES) IMPLANT
DRAPE TOP ARMCOVERS (MISCELLANEOUS) IMPLANT
DRAPE U-SHAPE 76X120 STRL (DRAPES) IMPLANT
DRAPE UTILITY XL STRL (DRAPES) IMPLANT
DRSG ADAPTIC 3X8 NADH LF (GAUZE/BANDAGES/DRESSINGS) IMPLANT
DRSG EMULSION OIL 3X3 NADH (GAUZE/BANDAGES/DRESSINGS) ×3 IMPLANT
DRSG PAD ABDOMINAL 8X10 ST (GAUZE/BANDAGES/DRESSINGS) IMPLANT
ELECT COATED BLADE 2.86 ST (ELECTRODE) IMPLANT
ELECT REM PT RETURN 9FT ADLT (ELECTROSURGICAL) ×3
ELECT REM PT RETURN 9FT PED (ELECTROSURGICAL)
ELECTRODE REM PT RETRN 9FT PED (ELECTROSURGICAL) IMPLANT
ELECTRODE REM PT RTRN 9FT ADLT (ELECTROSURGICAL) ×2 IMPLANT
EVACUATOR SILICONE 100CC (DRAIN) IMPLANT
GAUZE SPONGE 4X4 12PLY STRL (GAUZE/BANDAGES/DRESSINGS) ×3 IMPLANT
GAUZE SPONGE 4X4 12PLY STRL LF (GAUZE/BANDAGES/DRESSINGS) IMPLANT
GLOVE SURG ENC MOIS LTX SZ6.5 (GLOVE) IMPLANT
GLOVE SURG HYDRASOFT LTX SZ5.5 (GLOVE) ×3 IMPLANT
GLOVE SURG UNDER POLY LF SZ6.5 (GLOVE) IMPLANT
GOWN STRL REUS W/ TWL LRG LVL3 (GOWN DISPOSABLE) ×4 IMPLANT
GOWN STRL REUS W/TWL LRG LVL3 (GOWN DISPOSABLE) ×6
MATRIX WOUND MESHED 2X2 (Tissue) ×2 IMPLANT
NEEDLE HYPO 25X1 1.5 SAFETY (NEEDLE) ×3 IMPLANT
NEEDLE PRECISIONGLIDE 27X1.5 (NEEDLE) IMPLANT
NS IRRIG 1000ML POUR BTL (IV SOLUTION) ×3 IMPLANT
PACK BASIN DAY SURGERY FS (CUSTOM PROCEDURE TRAY) ×3 IMPLANT
PAD CAST 3X4 CTTN HI CHSV (CAST SUPPLIES) IMPLANT
PAD CAST 4YDX4 CTTN HI CHSV (CAST SUPPLIES) IMPLANT
PADDING CAST COTTON 3X4 STRL (CAST SUPPLIES)
PADDING CAST COTTON 4X4 STRL (CAST SUPPLIES)
PENCIL SMOKE EVACUATOR (MISCELLANEOUS) ×3 IMPLANT
PIN SAFETY STERILE (MISCELLANEOUS) IMPLANT
SHEET MEDIUM DRAPE 40X70 STRL (DRAPES) ×3 IMPLANT
SLEEVE SCD COMPRESS KNEE MED (STOCKING) ×3 IMPLANT
SPONGE T-LAP 18X18 ~~LOC~~+RFID (SPONGE) ×3 IMPLANT
STAPLER VISISTAT 35W (STAPLE) IMPLANT
STRIP CLOSURE SKIN 1/2X4 (GAUZE/BANDAGES/DRESSINGS) IMPLANT
SUCTION FRAZIER HANDLE 10FR (MISCELLANEOUS) ×3
SUCTION TUBE FRAZIER 10FR DISP (MISCELLANEOUS) ×2 IMPLANT
SURGILUBE 2OZ TUBE FLIPTOP (MISCELLANEOUS) IMPLANT
SUT CHROMIC 4 0 PS 2 18 (SUTURE) ×3 IMPLANT
SUT ETHILON 2 0 FS 18 (SUTURE) IMPLANT
SUT ETHILON 4 0 PS 2 18 (SUTURE) IMPLANT
SUT ETHILON 5 0 P 3 18 (SUTURE)
SUT MNCRL AB 4-0 PS2 18 (SUTURE) IMPLANT
SUT NYLON ETHILON 5-0 P-3 1X18 (SUTURE) IMPLANT
SUT PLAIN 5 0 P 3 18 (SUTURE) IMPLANT
SUT SILK 4 0 PS 2 (SUTURE) ×3 IMPLANT
SUT VIC AB 3-0 FS2 27 (SUTURE) IMPLANT
SUT VIC AB 5-0 PS2 18 (SUTURE) IMPLANT
SUT VICRYL 4-0 PS2 18IN ABS (SUTURE) IMPLANT
SWAB COLLECTION DEVICE MRSA (MISCELLANEOUS) IMPLANT
SWAB CULTURE ESWAB REG 1ML (MISCELLANEOUS) IMPLANT
SYR BULB IRRIG 60ML STRL (SYRINGE) ×3 IMPLANT
SYR CONTROL 10ML LL (SYRINGE) ×3 IMPLANT
TOWEL GREEN STERILE FF (TOWEL DISPOSABLE) ×3 IMPLANT
TRAY DSU PREP LF (CUSTOM PROCEDURE TRAY) ×3 IMPLANT
TUBE CONNECTING 20X1/4 (TUBING) ×3 IMPLANT
UNDERPAD 30X36 HEAVY ABSORB (UNDERPADS AND DIAPERS) ×3 IMPLANT
WOUND MATRIX MESHED 2X2 (Tissue) ×1 IMPLANT
YANKAUER SUCT BULB TIP NO VENT (SUCTIONS) ×3 IMPLANT

## 2020-11-12 NOTE — Anesthesia Preprocedure Evaluation (Addendum)
Anesthesia Evaluation  Patient identified by MRN, date of birth, ID band Patient awake    Reviewed: Allergy & Precautions, NPO status , Patient's Chart, lab work & pertinent test results  History of Anesthesia Complications Negative for: history of anesthetic complications  Airway Mallampati: I  TM Distance: >3 FB Neck ROM: Limited    Dental no notable dental hx. (+) Dental Advisory Given, Teeth Intact, Poor Dentition, Chipped   Pulmonary sleep apnea and Continuous Positive Airway Pressure Ventilation ,    Pulmonary exam normal breath sounds clear to auscultation       Cardiovascular hypertension, Pt. on medications Normal cardiovascular exam+ dysrhythmias Atrial Fibrillation  Rhythm:Regular Rate:Normal     Neuro/Psych PSYCHIATRIC DISORDERS Dementia  Vertigo Parkinsons disease   Neuromuscular disease    GI/Hepatic Neg liver ROS, GERD  Controlled,  Endo/Other  negative endocrine ROS  Renal/GU negative Renal ROS     Musculoskeletal  (+) Arthritis ,   Abdominal   Peds  Hematology negative hematology ROS (+)   Anesthesia Other Findings   Reproductive/Obstetrics                           Anesthesia Physical  Anesthesia Plan  ASA: 3  Anesthesia Plan: General   Post-op Pain Management:    Induction: Intravenous  PONV Risk Score and Plan: 3 and Treatment may vary due to age or medical condition, Ondansetron and Dexamethasone  Airway Management Planned: LMA  Additional Equipment: None  Intra-op Plan:   Post-operative Plan: Extubation in OR  Informed Consent: I have reviewed the patients History and Physical, chart, labs and discussed the procedure including the risks, benefits and alternatives for the proposed anesthesia with the patient or authorized representative who has indicated his/her understanding and acceptance.     Dental advisory given  Plan Discussed with: CRNA and  Anesthesiologist  Anesthesia Plan Comments: ( )       Anesthesia Quick Evaluation
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
## 2020-11-12 NOTE — Transfer of Care (Signed)
Immediate Anesthesia Transfer of Care Note  Patient: [name]  Procedure(s) Performed: DEBRIDEMENT LLE ULCER (Left: Leg Lower) POSSIBLE APPLICATION OF A-CELL VS. INTEGRA (Left) POSSIBLE PRIMARY CLOSURE WOUND (Left: Leg Lower)  Patient Location: PACU  Anesthesia Type:General  Level of Consciousness: awake  Airway & Oxygen Therapy: Patient Spontanous Breathing and Patient connected to face mask oxygen  Post-op Assessment: Report given to RN and Post -op Vital signs reviewed and stable  Post vital signs: Reviewed and stable  Last Vitals:  Vitals Value Taken Time  BP    Temp    Pulse 64 11/12/20 1340  Resp    SpO2 99 % 11/12/20 1340  Vitals shown include unvalidated device data.  Last Pain: There were no vitals filed for this visit.       Complications: No notable events documented.

## 2020-11-12 NOTE — Op Note (Signed)
Operative Note   DATE OF OPERATION: 7.29.22  LOCATION: [facility]-outpatient  SURGICAL DIVISION: Plastic Surgery  PREOPERATIVE DIAGNOSES:  1. 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 Preparation for grafting left leg 2 cm2 2. Application Integra substrate to left leg 2 cm2  SURGEON: Irene Limbo MD MBA  ASSISTANT: none  ANESTHESIA:  General.   EBL: 5 ml  COMPLICATIONS: None immediate.   INDICATIONS FOR PROCEDURE:  The patient, [name], is a 76 y.o. male born on [birth year], is here for treatment chronic ulcer left leg with exposed bone in setting prior severe bony and soft tissue trauma.   FINDINGS: Left left ulcer with exposed tibia present. Exposed bone noted to be mobile and represented sequestrum. Following debridement open wound 1 x 2 cm noted to bone  DESCRIPTION OF PROCEDURE:  The patient's operative site was marked with the patient in the preoperative area. The patient was taken to the operating room. SCD was placed and IV antibiotics were given. The patient's operative site was prepped and draped in a sterile fashion. A time out was performed and all information was confirmed to be correct. Local anesthetic infiltrated surrounding ulcer. Sharp excision skin margins wound completed with knife. Curette used to removed exposed and mobile bone. This was sent for pathology and culture. Burr used to tangentially debride bone surface until punctate bleeding noted. Integra dermal substrate inset over bone with 4-0 chromic, 2 cm2. Adaptic and sterile sponge applied as bolster, secured with 4-0 silk.  The patient was allowed to wake from anesthesia, extubated and taken to the recovery room in satisfactory condition.   SPECIMENS: left tibia for pathology and culture  DRAINS: none

## 2020-11-12 NOTE — Discharge Instructions (Signed)

## 2020-11-12 NOTE — Anesthesia Procedure Notes (Signed)
Procedure Name: LMA Insertion Date/Time: 11/12/2020 1:03 PM Performed by: Tawni Millers, CRNA Pre-anesthesia Checklist: Patient identified, Emergency Drugs available, Suction available and Patient being monitored Patient Re-evaluated:Patient Re-evaluated prior to induction Oxygen Delivery Method: Circle system utilized Preoxygenation: Pre-oxygenation with 100% oxygen Induction Type: IV induction Ventilation: Mask ventilation without difficulty LMA: LMA inserted LMA Size: 5.0 Number of attempts: 1 Airway Equipment and Method: Bite block Placement Confirmation: positive ETCO2 Tube secured with: Tape Dental Injury: Teeth and Oropharynx as per pre-operative assessment

## 2020-11-15 ENCOUNTER — Encounter (HOSPITAL_BASED_OUTPATIENT_CLINIC_OR_DEPARTMENT_OTHER): Payer: Self-pay | Admitting: Plastic Surgery

## 2020-11-15 LAB — AEROBIC/ANAEROBIC CULTURE W GRAM STAIN (SURGICAL/DEEP WOUND)

## 2020-11-15 LAB — SURGICAL PATHOLOGY

## 2020-11-15 NOTE — Anesthesia Postprocedure Evaluation (Signed)
Anesthesia Post Note  Patient: [name]  Procedure(s) Performed: DEBRIDEMENT LLE ULCER (Left: Leg Lower) APPLICATION OF  INTEGRA (Left: Leg Lower)     Patient location during evaluation: PACU Anesthesia Type: General Level of consciousness: awake and alert Pain management: pain level controlled Vital Signs Assessment: post-procedure vital signs reviewed and stable Respiratory status: spontaneous breathing, nonlabored ventilation, respiratory function stable and patient connected to nasal cannula oxygen Cardiovascular status: blood pressure returned to baseline and stable Postop Assessment: no apparent nausea or vomiting Anesthetic complications: no   No notable events documented.  Last Vitals:  Vitals:   11/12/20 1400 11/12/20 1415  BP: 116/74 133/81  Pulse: 67 67  Resp: 10 16  Temp:  36.8 C  SpO2: 93% 95%    Last Pain:  Vitals:   11/12/20 1415  PainSc: 0-No pain                 Tymeer Vaquera

## 2020-11-18 DIAGNOSIS — L97924 Non-pressure chronic ulcer of unspecified part of left lower leg with necrosis of bone: Secondary | ICD-10-CM | POA: Diagnosis not present

## 2020-11-26 ENCOUNTER — Telehealth: Payer: Self-pay | Admitting: Neurology

## 2020-11-26 DIAGNOSIS — M65331 Trigger finger, right middle finger: Secondary | ICD-10-CM | POA: Diagnosis not present

## 2020-11-26 DIAGNOSIS — S20212A Contusion of left front wall of thorax, initial encounter: Secondary | ICD-10-CM | POA: Diagnosis not present

## 2020-11-26 MED ORDER — PRAMIPEXOLE DIHYDROCHLORIDE 0.5 MG PO TABS
ORAL_TABLET | ORAL | 0 refills | Status: DC
Start: 2020-11-26 — End: 2021-02-14

## 2020-11-26 NOTE — Telephone Encounter (Signed)
Patient called and left a voice mail that he is almost out of his pramepexole .5 MG. He only has enough to last up to Sunday.  [facility] in [place] on [address]

## 2020-11-26 NOTE — Telephone Encounter (Signed)
Refill has been sent to pharmacy and patient has been called and notified.

## 2020-11-30 DIAGNOSIS — M461 Sacroiliitis, not elsewhere classified: Secondary | ICD-10-CM | POA: Diagnosis not present

## 2020-12-01 DIAGNOSIS — L97924 Non-pressure chronic ulcer of unspecified part of left lower leg with necrosis of bone: Secondary | ICD-10-CM | POA: Diagnosis not present

## 2020-12-16 DIAGNOSIS — L97924 Non-pressure chronic ulcer of unspecified part of left lower leg with necrosis of bone: Secondary | ICD-10-CM | POA: Diagnosis not present

## 2020-12-21 DIAGNOSIS — Z6827 Body mass index (BMI) 27.0-27.9, adult: Secondary | ICD-10-CM | POA: Diagnosis not present

## 2020-12-21 DIAGNOSIS — M461 Sacroiliitis, not elsewhere classified: Secondary | ICD-10-CM | POA: Diagnosis not present

## 2020-12-21 DIAGNOSIS — Z981 Arthrodesis status: Secondary | ICD-10-CM | POA: Diagnosis not present

## 2020-12-21 DIAGNOSIS — R030 Elevated blood-pressure reading, without diagnosis of hypertension: Secondary | ICD-10-CM | POA: Diagnosis not present

## 2020-12-24 ENCOUNTER — Other Ambulatory Visit: Payer: Self-pay | Admitting: Cardiology

## 2020-12-24 DIAGNOSIS — I10 Essential (primary) hypertension: Secondary | ICD-10-CM

## 2020-12-30 ENCOUNTER — Ambulatory Visit (INDEPENDENT_AMBULATORY_CARE_PROVIDER_SITE_OTHER): Payer: Medicare Other | Admitting: Family Medicine

## 2020-12-30 ENCOUNTER — Other Ambulatory Visit: Payer: Self-pay

## 2020-12-30 ENCOUNTER — Encounter: Payer: Self-pay | Admitting: Family Medicine

## 2020-12-30 VITALS — BP 122/86 | HR 77 | Temp 97.6°F | Ht 75.0 in | Wt 215.0 lb

## 2020-12-30 DIAGNOSIS — R5383 Other fatigue: Secondary | ICD-10-CM

## 2020-12-30 LAB — CBC WITH DIFFERENTIAL/PLATELET
Basophils Absolute: 0.1 10*3/uL (ref 0.0–0.1)
Basophils Relative: 0.8 % (ref 0.0–3.0)
Eosinophils Absolute: 0.2 10*3/uL (ref 0.0–0.7)
Eosinophils Relative: 2 % (ref 0.0–5.0)
HCT: 43.1 % (ref 39.0–52.0)
Hemoglobin: 14.3 g/dL (ref 13.0–17.0)
Lymphocytes Relative: 19.2 % (ref 12.0–46.0)
Lymphs Abs: 1.6 10*3/uL (ref 0.7–4.0)
MCHC: 33.3 g/dL (ref 30.0–36.0)
MCV: 92.8 fl (ref 78.0–100.0)
Monocytes Absolute: 0.8 10*3/uL (ref 0.1–1.0)
Monocytes Relative: 9.8 % (ref 3.0–12.0)
Neutro Abs: 5.8 10*3/uL (ref 1.4–7.7)
Neutrophils Relative %: 68.2 % (ref 43.0–77.0)
Platelets: 209 10*3/uL (ref 150.0–400.0)
RBC: 4.65 Mil/uL (ref 4.22–5.81)
RDW: 13.1 % (ref 11.5–15.5)
WBC: 8.5 10*3/uL (ref 4.0–10.5)

## 2020-12-30 LAB — COMPREHENSIVE METABOLIC PANEL
ALT: 18 U/L (ref 0–53)
AST: 16 U/L (ref 0–37)
Albumin: 4.1 g/dL (ref 3.5–5.2)
Alkaline Phosphatase: 72 U/L (ref 39–117)
BUN: 16 mg/dL (ref 6–23)
CO2: 28 mEq/L (ref 19–32)
Calcium: 9.6 mg/dL (ref 8.4–10.5)
Chloride: 102 mEq/L (ref 96–112)
Creatinine, Ser: 1.28 mg/dL (ref 0.40–1.50)
GFR: 55.12 mL/min — ABNORMAL LOW (ref >60.00)
Glucose, Bld: 85 mg/dL (ref 70–99)
Potassium: 4.4 mEq/L (ref 3.5–5.1)
Sodium: 138 mEq/L (ref 135–145)
Total Bilirubin: 0.6 mg/dL (ref 0.2–1.2)
Total Protein: 6.7 g/dL (ref 6.0–8.3)

## 2020-12-30 LAB — TSH: TSH: 3.93 u[IU]/mL (ref 0.35–5.50)

## 2020-12-30 LAB — VITAMIN B12: Vitamin B-12: 688 pg/mL (ref 211–911)

## 2020-12-30 LAB — T4, FREE: Free T4: 0.93 ng/dL (ref 0.60–1.60)

## 2020-12-30 NOTE — Patient Instructions (Addendum)
Don't change your meds yet.  Go to the lab on the way out.   If you have mychart we'll likely use that to update you.    We may need to adjust your propranolol and diltiazem but I wouldn't do it yet.  Take care.  Glad to see you.

## 2020-12-30 NOTE — Progress Notes (Signed)
This visit occurred during the SARS-CoV-2 public health emergency.  Safety protocols were in place, including screening questions prior to the visit, additional usage of staff PPE, and extensive cleaning of exam room while observing appropriate contact time as indicated for disinfecting solutions.  Fatigue/low energy.  Going on for months.  Sleeping well except for nocturia but still tired.  He'll fall asleep in the recliner if he tries to watch TV.  No high risk events- no issues driving.  No FCNAVD.  He doesn't feel sick o/w.  No blood in urine or stools.  Not snoring, not waking gasping for air.  He isn't waking up tired.  He can ride his bike for miles and do well with that w/o BP.  He has more trouble walking from back pain but doesn't well biking.  No syncope.    L shin is healing with a small scar noted now.  No bleeding or oozing.    Meds, vitals, and allergies reviewed.   ROS: Per HPI unless specifically indicated in ROS section   GEN: nad, alert and oriented HEENT: ncat NECK: supple w/o LA CV: rrr.  PULM: ctab, no inc wob ABD: soft, +bs EXT: no edema SKIN: no acute rash but 1.5 x 0.5cm scab w/o erythema on the L shin.

## 2020-12-31 ENCOUNTER — Telehealth: Payer: Self-pay | Admitting: Family Medicine

## 2020-12-31 NOTE — Telephone Encounter (Signed)
Pt called in wants to know the next steps . After lab results . Would like a call back# [phone]

## 2021-01-02 ENCOUNTER — Other Ambulatory Visit: Payer: Self-pay | Admitting: Family Medicine

## 2021-01-02 DIAGNOSIS — R5383 Other fatigue: Secondary | ICD-10-CM | POA: Insufficient documentation

## 2021-01-02 MED ORDER — PROPRANOLOL HCL 20 MG PO TABS: 10.0000 mg | ORAL_TABLET | Freq: Two times a day (BID) | ORAL | Status: DC

## 2021-01-02 NOTE — Assessment & Plan Note (Signed)
Broad ddx d/w pt, including parkinsons.  Would check basic labs today and go from there.  Unclear if he has a contribution from his blood pressure medications.  See notes on labs.  Okay for outpatient follow-up.

## 2021-01-02 NOTE — Telephone Encounter (Signed)
See notes on labs. Thanks.

## 2021-01-06 DIAGNOSIS — L97924 Non-pressure chronic ulcer of unspecified part of left lower leg with necrosis of bone: Secondary | ICD-10-CM | POA: Diagnosis not present

## 2021-02-10 ENCOUNTER — Other Ambulatory Visit: Payer: Self-pay

## 2021-02-10 ENCOUNTER — Encounter: Payer: Self-pay | Admitting: Internal Medicine

## 2021-02-10 ENCOUNTER — Ambulatory Visit (INDEPENDENT_AMBULATORY_CARE_PROVIDER_SITE_OTHER): Payer: Medicare Other | Admitting: Internal Medicine

## 2021-02-10 ENCOUNTER — Ambulatory Visit: Payer: Medicare Other | Admitting: Primary Care

## 2021-02-10 DIAGNOSIS — H6502 Acute serous otitis media, left ear: Secondary | ICD-10-CM | POA: Diagnosis not present

## 2021-02-10 MED ORDER — PREDNISONE 20 MG PO TABS: 40.0000 mg | ORAL_TABLET | Freq: Every day | ORAL | 0 refills | Status: DC

## 2021-02-10 NOTE — Progress Notes (Signed)
Subjective:    Patient ID: [name], male    DOB: 1946-02-06, 75 y.o.   MRN: [id number]  HPI Here due to left ear pain This visit occurred during the SARS-CoV-2 public health emergency.  Safety protocols were in place, including screening questions prior to the visit, additional usage of staff PPE, and extensive cleaning of exam room while observing appropriate contact time as indicated for disinfecting solutions.   Sleeps with a ceiling fan He puts cotton in there --to protect his canals from the cold Awoke and noticed pain in left ear and aching---he could hear water in there about 4 days Wife tried a little oil--usually helps--but didn't this time  No discharge Takes flonase regularly  Current Outpatient Medications on File Prior to Visit  Medication Sig Dispense Refill   acetaminophen (TYLENOL) 500 MG tablet Take 1,000 mg by mouth every 6 (six) hours as needed.     diazepam (VALIUM) 5 MG tablet Take 5 mg by mouth daily as needed for anxiety.     diclofenac Sodium (VOLTAREN) 1 % GEL Apply topically 4 (four) times daily.     diltiazem (CARDIZEM CD) 240 MG 24 hr capsule TAKE 1 CAPSULE(240 MG) BY MOUTH DAILY 30 capsule 3   fluticasone (FLONASE) 50 MCG/ACT nasal spray Place 2 sprays into both nostrils daily.     Menthol, Topical Analgesic, (BIOFREEZE EX) Apply 1 application topically daily as needed (pain).     omeprazole (PRILOSEC) 20 MG capsule Take 20 mg by mouth daily as needed (heart burn).     Oxycodone HCl 10 MG TABS      polyethylene glycol powder (GLYCOLAX/MIRALAX) 17 GM/SCOOP powder See admin instructions.     pramipexole (MIRAPEX) 0.5 MG tablet TAKE 1 TABLET (0.5 MG TOTAL) BY MOUTH 3 (THREE) TIMES DAILY. 270 tablet 0   propranolol (INDERAL) 20 MG tablet Take 0.5 tablets (10 mg total) by mouth 2 (two) times daily.     traMADol (ULTRAM) 50 MG tablet TAKE 1 TABLET (50 MG TOTAL) BY MOUTH EVERY 12 (TWELVE) HOURS AS NEEDED. 40 tablet 0   zinc gluconate 50 MG tablet as  needed.     zolpidem (AMBIEN) 5 MG tablet Take 1 tablet (5 mg total) by mouth at bedtime as needed for sleep. 30 tablet 1   No current facility-administered medications on file prior to visit.    Allergies  Allergen Reactions   Flecainide     Vomit    Flexeril [Cyclobenzaprine]     Constipation   Hctz [Hydrochlorothiazide]     hyponatremia   Morphine And Related Hives    Hallucinations- can tolerate oxycodone.    Promethazine     Would avoid due to Parkinsons   Sinemet [Carbidopa-Levodopa]     intolerant    Past Medical History:  Diagnosis Date   A-fib ([place])    Arthritis    GERD (gastroesophageal reflux disease)    H/O urinary frequency    Heart murmur    as a child   History of bronchitis    HSV infection    oral   Hypertension    improved after back pain treated with surgery   Insomnia    Insomnia    Improved with Ambien, did not tolerate melatonin.   Joint pain    Parkinson disease ([place])    Sleep apnea    no CPAP   Vertigo 10/13/2014   recurrent; trigger is cool air on left ear, improved with daily antihistamine    Past  Surgical History:  Procedure Laterality Date   ACHILLES TENDON REPAIR Left    ALLOGRAFT APPLICATION Left 05/17/8655   Procedure: APPLICATION OF  INTEGRA;  Surgeon: Irene Limbo, MD;  Location: [facility];  Service: Plastics;  Laterality: Left;   BACK SURGERY     COLONOSCOPY  02/2011   Negative,[facility]   DEBRIDEMENT AND CLOSURE WOUND Left 02/25/2018   Procedure: DEBRIDEMENT LEFT LEG APPLICATION INTEGRA;  Surgeon: Irene Limbo, MD;  Location: [facility];  Service: Plastics;  Laterality: Left;   EYE SURGERY Bilateral    cataracts   INCISION AND DRAINAGE OF WOUND Left 11/12/2020   Procedure: DEBRIDEMENT LLE ULCER;  Surgeon: Irene Limbo, MD;  Location: [facility];  Service: Plastics;  Laterality: Left;   KNEE ARTHROSCOPY     L   LUMBAR FUSION     2016   LUMBAR SPINE SURGERY   03/31/2019   exploration of lumbar fusion w/L5-S1 decompression redo L4-5 fusion   SPINE SURGERY  2003   cervical fusion   TONSILLECTOMY     varicoelectomy      Family History  Problem Relation Age of Onset   Hypertension Mother    Asthma Mother    Diabetes Father    Stroke Father    Heart attack Paternal Grandfather        >55   Cancer Sister    Colon cancer Neg Hx    Prostate cancer Neg Hx     Social History   Socioeconomic History   Marital status: Married    Spouse name: Not on file   Number of children: 2   Years of education: 14   Highest education level: Not on file  Occupational History   Occupation: retired  Tobacco Use   Smoking status: Never   Smokeless tobacco: Never  Vaping Use   Vaping Use: Never used  Substance and Sexual Activity   Alcohol use: No   Drug use: No   Sexual activity: Not on file  Other Topics Concern   Not on file  Social History Narrative   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     Social Determinants of Health   Financial Resource Strain: Low Risk    Difficulty of Paying Living Expenses: Not hard at all  Food Insecurity: No Food Insecurity   Worried About Charity fundraiser in the Last Year: Never true   [place] in the Last Year: Never true  Transportation Needs: No Transportation Needs   Lack of Transportation (Medical): No   Lack of Transportation (Non-Medical): No  Physical Activity: Sufficiently Active   Days of Exercise per Week: 4 days   Minutes of Exercise per Session: 120 min  Stress: No Stress Concern Present   Feeling of Stress : Not at all  Social Connections: Not on file  Intimate Partner Violence: Not At Risk   Fear of Current or Ex-Partner: No   Emotionally Abused: No   Physically Abused: No    Sexually Abused: No   Review of Systems Did have a lot of dust exposure--when trying to mow up leaves behind his tractor (had cough after this) No fever    Objective:   Physical Exam Constitutional:  Appearance: Normal appearance.  HENT:     Right Ear: Tympanic membrane, ear canal and external ear normal.     Left Ear: Ear canal and external ear normal.     Ears:     Comments: Left TM is distended with yellowish discoloration superiorly No inflammation Neurological:     Mental Status: He is alert.           Assessment & Plan:

## 2021-02-10 NOTE — Assessment & Plan Note (Signed)
Likely related to allergic exposure after the leaf work Will try prednisone burst If pain comes back, would give short course of amoxicillin

## 2021-02-12 ENCOUNTER — Other Ambulatory Visit: Payer: Self-pay | Admitting: Neurology

## 2021-02-12 DIAGNOSIS — G20 Parkinson's disease: Secondary | ICD-10-CM

## 2021-02-14 ENCOUNTER — Other Ambulatory Visit: Payer: Self-pay

## 2021-02-19 ENCOUNTER — Telehealth: Payer: Self-pay | Admitting: Neurology

## 2021-02-19 DIAGNOSIS — G20 Parkinson's disease: Secondary | ICD-10-CM

## 2021-02-21 ENCOUNTER — Other Ambulatory Visit: Payer: Self-pay

## 2021-02-21 ENCOUNTER — Telehealth: Payer: Self-pay | Admitting: Student

## 2021-02-21 NOTE — Telephone Encounter (Signed)
Ref req for below meds   diltiazem (CARDIZEM CD) 240 MG 24 hr capsule  [id number] - [place], [place] - [address]  [phone]

## 2021-02-21 NOTE — Telephone Encounter (Signed)
Called patient and LVM I have already ordered his Pramipexole from [facility] unless there is a different medication that he is needing

## 2021-02-22 ENCOUNTER — Other Ambulatory Visit: Payer: Self-pay

## 2021-02-22 ENCOUNTER — Telehealth: Payer: Self-pay | Admitting: Neurology

## 2021-02-22 DIAGNOSIS — G20 Parkinson's disease: Secondary | ICD-10-CM

## 2021-02-22 DIAGNOSIS — I10 Essential (primary) hypertension: Secondary | ICD-10-CM

## 2021-02-22 MED ORDER — DILTIAZEM HCL ER COATED BEADS 240 MG PO CP24
ORAL_CAPSULE | ORAL | 3 refills | Status: DC
Start: 2021-02-22 — End: 2021-05-12

## 2021-02-22 MED ORDER — MIRTAZAPINE 15 MG PO TABS: 15.0000 mg | ORAL_TABLET | Freq: Every day | ORAL | 0 refills | Status: DC

## 2021-02-22 NOTE — Telephone Encounter (Signed)
Has already been sent to the pharmacy

## 2021-02-22 NOTE — Telephone Encounter (Signed)
Pt said he needs a refill on his sleep medication. Mirtazapine 15mg   Pharmacy-[facility] [address] [place]

## 2021-02-22 NOTE — Telephone Encounter (Signed)
Done prescription has been sent

## 2021-02-24 DIAGNOSIS — L97924 Non-pressure chronic ulcer of unspecified part of left lower leg with necrosis of bone: Secondary | ICD-10-CM | POA: Diagnosis not present

## 2021-02-28 ENCOUNTER — Other Ambulatory Visit: Payer: Self-pay

## 2021-02-28 ENCOUNTER — Encounter: Payer: Self-pay | Admitting: Family Medicine

## 2021-02-28 ENCOUNTER — Telehealth (INDEPENDENT_AMBULATORY_CARE_PROVIDER_SITE_OTHER): Payer: Medicare Other | Admitting: Family Medicine

## 2021-02-28 DIAGNOSIS — J209 Acute bronchitis, unspecified: Secondary | ICD-10-CM | POA: Insufficient documentation

## 2021-02-28 MED ORDER — BENZONATATE 200 MG PO CAPS: 200.0000 mg | ORAL_CAPSULE | Freq: Three times a day (TID) | ORAL | 1 refills | Status: DC | PRN

## 2021-02-28 MED ORDER — AZITHROMYCIN 250 MG PO TABS: ORAL_TABLET | ORAL | 0 refills | Status: DC

## 2021-02-28 NOTE — Patient Instructions (Signed)
Drink fluid and try to get enough rest  Try the tessalon for cough as needed (do not bite the pill) Take zpack as directed  Update if not starting to improve in a week or if worsening    If symptoms suddenly worsen (become severe) or you have difficulty breathing please alert [place] and go to the ER

## 2021-02-28 NOTE — Assessment & Plan Note (Signed)
Day 14, started with viral uri (never did covid test)  Says he is prone to this occ productive  No fever or sob or wheeze  Cough sounds tight /hacking  Given length of symptoms, zpak px  If wheeze, consider inhaler/prednisone Tessalon sent for cough prn Update if not starting to improve in a week or if worsening  ER precautions reviewed

## 2021-02-28 NOTE — Progress Notes (Signed)
Virtual Visit via Video Note  I connected with [name] on 02/28/21 at 10:00 AM EST by a video enabled telemedicine application and verified that I am speaking with the correct person using two identifiers.  Location: Patient: home Provider: office    I discussed the limitations of evaluation and management by telemedicine and the availability of in person appointments. The patient expressed understanding and agreed to proceed.  Parties involved in encounter  Patient: [name]  Provider:  Loura Pardon MD   History of Present Illness: 75 yo pt of Dr Damita Dunnings presents with tickle in his throat and cough  He has a h/o parkinsons with dementia and chronic wound with osteomyelitis (no abx now, was just released)  Symptoms started 2 weeks  Some ear pressure and decreased hearing Took prednisone and it improved (left ear)   No ST No facial pain or pressure No headache   No wheezing  No sob   At times he feels tired/worn out  No fever No body aches and chills   He has a h/o bronchitis  Tickle in throat  Cough -worse when talking and at night   Occ mucous-clear   Has allergies - took benadryl  Allergy pill   Otc: Allergy pill?  Lozenge  Mucinex DM-not helpful  Flonase ns   Has not done a covid test    Patient Active Problem List   Diagnosis Date Noted   Acute bronchitis 02/28/2021   Acute serous otitis media of left ear without rupture 02/10/2021   Other fatigue 01/02/2021   Change in bowel habit 08/06/2020   Healthcare maintenance 03/31/2020   Hypothyroidism 03/31/2020   Dysfunction of eustachian tube 03/10/2020   Constipation 11/19/2019   Insomnia    Sore throat 08/28/2019   Allergic rhinitis 07/03/2019   Hyponatremia 05/15/2019   Atrial fibrillation, chronic ([place]) 05/15/2019   Parkinson's disease ([place]) 05/04/2019   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[place])    Atrial fibrillation with RVR ([place]) 04/24/2019    History of recent back surgery 04/24/2019   Body mass index (BMI) 25.0-25.9, adult 04/22/2019   Neural foraminal stenosis of lumbosacral spine 03/31/2019   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   Degenerative scoliosis in adult patient 03/20/2019   Other intervertebral disc displacement, lumbosacral region 03/20/2019   Status post lumbar spinal fusion 02/20/2019   Neck pain 02/04/2019   Cough 04/24/2018   Skin lesion 10/31/2017   Lower urinary tract symptoms (LUTS) 04/17/2016   Oral lesion 02/16/2016   Medicare annual wellness visit, initial 12/01/2015   Advance care planning 12/01/2015   Lower back pain 12/01/2015   OSA on CPAP    HTN (hypertension) 04/23/2015   Spondylolisthesis of lumbar region 12/16/2014   GERD (gastroesophageal reflux disease) 10/11/2012   RBBB 06/27/2011   Past Medical History:  Diagnosis Date   A-fib ([place])    Arthritis    GERD (gastroesophageal reflux disease)    H/O urinary frequency    Heart murmur    as a child   History of bronchitis    HSV infection    oral   Hypertension    improved after back pain treated with surgery   Insomnia    Insomnia    Improved with Ambien, did not tolerate melatonin.   Joint pain    Parkinson disease ([place])    Sleep apnea    no CPAP   Vertigo 10/13/2014   recurrent; trigger is cool air on left ear, improved  with daily antihistamine   Past Surgical History:  Procedure Laterality Date   ACHILLES TENDON REPAIR Left    ALLOGRAFT APPLICATION Left 3/53/2992   Procedure: APPLICATION OF  INTEGRA;  Surgeon: Irene Limbo, MD;  Location: [facility];  Service: Plastics;  Laterality: Left;   BACK SURGERY     COLONOSCOPY  02/2011   Negative,[facility]   DEBRIDEMENT AND CLOSURE WOUND Left 02/25/2018   Procedure: DEBRIDEMENT LEFT LEG APPLICATION INTEGRA;  Surgeon: Irene Limbo, MD;  Location: [facility];  Service: Plastics;  Laterality: Left;   EYE SURGERY  Bilateral    cataracts   INCISION AND DRAINAGE OF WOUND Left 11/12/2020   Procedure: DEBRIDEMENT LLE ULCER;  Surgeon: Irene Limbo, MD;  Location: [facility];  Service: Plastics;  Laterality: Left;   KNEE ARTHROSCOPY     L   LUMBAR FUSION     2016   LUMBAR SPINE SURGERY  03/31/2019   exploration of lumbar fusion w/L5-S1 decompression redo L4-5 fusion   SPINE SURGERY  2003   cervical fusion   TONSILLECTOMY     varicoelectomy     Social History   Tobacco Use   Smoking status: Never   Smokeless tobacco: Never  Vaping Use   Vaping Use: Never used  Substance Use Topics   Alcohol use: No   Drug use: No   Family History  Problem Relation Age of Onset   Hypertension Mother    Asthma Mother    Diabetes Father    Stroke Father    Heart attack Paternal Grandfather        >55   Cancer Sister    Colon cancer Neg Hx    Prostate cancer Neg Hx    Allergies  Allergen Reactions   Flecainide     Vomit    Flexeril [Cyclobenzaprine]     Constipation   Hctz [Hydrochlorothiazide]     hyponatremia   Morphine And Related Hives    Hallucinations- can tolerate oxycodone.    Promethazine     Would avoid due to Parkinsons   Sinemet [Carbidopa-Levodopa]     intolerant   Current Outpatient Medications on File Prior to Visit  Medication Sig Dispense Refill   acetaminophen (TYLENOL) 500 MG tablet Take 1,000 mg by mouth every 6 (six) hours as needed.     diazepam (VALIUM) 5 MG tablet Take 5 mg by mouth daily as needed for anxiety.     diclofenac Sodium (VOLTAREN) 1 % GEL Apply topically 4 (four) times daily.     diltiazem (CARDIZEM CD) 240 MG 24 hr capsule TAKE 1 CAPSULE(240 MG) BY MOUTH DAILY 30 capsule 3   fluticasone (FLONASE) 50 MCG/ACT nasal spray Place 2 sprays into both nostrils daily.     Menthol, Topical Analgesic, (BIOFREEZE EX) Apply 1 application topically daily as needed (pain).     mirtazapine (REMERON) 15 MG tablet Take 1 tablet (15 mg total) by mouth at  bedtime. 90 tablet 0   omeprazole (PRILOSEC) 20 MG capsule Take 20 mg by mouth daily as needed (heart burn).     Oxycodone HCl 10 MG TABS      polyethylene glycol powder (GLYCOLAX/MIRALAX) 17 GM/SCOOP powder See admin instructions.     pramipexole (MIRAPEX) 0.5 MG tablet TAKE 1 TABLET(0.5 MG) BY MOUTH THREE TIMES DAILY 270 tablet 0   propranolol (INDERAL) 20 MG tablet Take 0.5 tablets (10 mg total) by mouth 2 (two) times daily. (Patient taking differently: Take 20 mg by  mouth 2 (two) times daily.)     traMADol (ULTRAM) 50 MG tablet TAKE 1 TABLET (50 MG TOTAL) BY MOUTH EVERY 12 (TWELVE) HOURS AS NEEDED. 40 tablet 0   zinc gluconate 50 MG tablet as needed.     zolpidem (AMBIEN) 5 MG tablet Take 1 tablet (5 mg total) by mouth at bedtime as needed for sleep. 30 tablet 1   No current facility-administered medications on file prior to visit.   Review of Systems  Constitutional:  Negative for chills, fever and malaise/fatigue.  HENT:  Positive for congestion. Negative for ear pain, sinus pain and sore throat.   Eyes:  Negative for blurred vision, discharge and redness.  Respiratory:  Positive for cough and sputum production. Negative for shortness of breath, wheezing and stridor.   Cardiovascular:  Negative for chest pain, palpitations and leg swelling.  Gastrointestinal:  Negative for abdominal pain, diarrhea, nausea and vomiting.  Musculoskeletal:  Negative for myalgias.  Skin:  Negative for rash.  Neurological:  Negative for dizziness and headaches.     Observations/Objective:  Patient appears well, in no distress Weight is baseline  No facial swelling or asymmetry Normal voice-not hoarse and no slurred speech Occ clears his throat  No obvious tremor or mobility impairment Moving neck and UEs normally Able to hear the call well  No cough or shortness of breath during interview  Talkative and mentally sharp with no cognitive changes No skin changes on face or neck , no rash or  pallor Affect is normal   Assessment and Plan: Problem List Items Addressed This Visit       Respiratory   Acute bronchitis    Day 14, started with viral uri (never did covid test)  Says he is prone to this occ productive  No fever or sob or wheeze  Cough sounds tight /hacking  Given length of symptoms, zpak px  If wheeze, consider inhaler/prednisone Tessalon sent for cough prn Update if not starting to improve in a week or if worsening  ER precautions reviewed         Follow Up Instructions: Drink fluid and try to get enough rest  Try the tessalon for cough as needed (do not bite the pill) Take zpack as directed  Update if not starting to improve in a week or if worsening    If symptoms suddenly worsen (become severe) or you have difficulty breathing please alert [place] and go to the ER   I discussed the assessment and treatment plan with the patient. The patient was provided an opportunity to ask questions and all were answered. The patient agreed with the plan and demonstrated an understanding of the instructions.   The patient was advised to call back or seek an in-person evaluation if the symptoms worsen or if the condition fails to improve as anticipated.     Loura Pardon, MD

## 2021-03-07 ENCOUNTER — Other Ambulatory Visit: Payer: Self-pay

## 2021-03-07 ENCOUNTER — Ambulatory Visit (INDEPENDENT_AMBULATORY_CARE_PROVIDER_SITE_OTHER): Payer: Medicare Other

## 2021-03-07 ENCOUNTER — Telehealth: Payer: Self-pay | Admitting: Family

## 2021-03-07 ENCOUNTER — Encounter: Payer: Self-pay | Admitting: Family

## 2021-03-07 ENCOUNTER — Ambulatory Visit (INDEPENDENT_AMBULATORY_CARE_PROVIDER_SITE_OTHER): Payer: Medicare Other | Admitting: Family

## 2021-03-07 VITALS — BP 122/76 | HR 60 | Temp 97.9°F | Ht 75.0 in | Wt 224.0 lb

## 2021-03-07 DIAGNOSIS — R059 Cough, unspecified: Secondary | ICD-10-CM | POA: Diagnosis not present

## 2021-03-07 DIAGNOSIS — J209 Acute bronchitis, unspecified: Secondary | ICD-10-CM

## 2021-03-07 DIAGNOSIS — R052 Subacute cough: Secondary | ICD-10-CM

## 2021-03-07 DIAGNOSIS — J45901 Unspecified asthma with (acute) exacerbation: Secondary | ICD-10-CM

## 2021-03-07 LAB — CBC WITH DIFFERENTIAL/PLATELET
Basophils Absolute: 0 10*3/uL (ref 0.0–0.1)
Basophils Relative: 0.7 % (ref 0.0–3.0)
Eosinophils Absolute: 0.3 10*3/uL (ref 0.0–0.7)
Eosinophils Relative: 4.9 % (ref 0.0–5.0)
HCT: 40.7 % (ref 39.0–52.0)
Hemoglobin: 13.7 g/dL (ref 13.0–17.0)
Lymphocytes Relative: 26.3 % (ref 12.0–46.0)
Lymphs Abs: 1.7 10*3/uL (ref 0.7–4.0)
MCHC: 33.8 g/dL (ref 30.0–36.0)
MCV: 90.7 fl (ref 78.0–100.0)
Monocytes Absolute: 0.7 10*3/uL (ref 0.1–1.0)
Monocytes Relative: 10.9 % (ref 3.0–12.0)
Neutro Abs: 3.7 10*3/uL (ref 1.4–7.7)
Neutrophils Relative %: 57.2 % (ref 43.0–77.0)
Platelets: 235 10*3/uL (ref 150.0–400.0)
RBC: 4.49 Mil/uL (ref 4.22–5.81)
RDW: 13.4 % (ref 11.5–15.5)
WBC: 6.4 10*3/uL (ref 4.0–10.5)

## 2021-03-07 MED ORDER — METHYLPREDNISOLONE 4 MG PO TBPK: ORAL_TABLET | ORAL | 0 refills | Status: DC

## 2021-03-07 NOTE — Progress Notes (Signed)
Established Patient Office Visit  Subjective:  Patient ID: [name], male    DOB: Jan 02, 1946  Age: 75 y.o. MRN: [id number]  CC:  Chief Complaint  Patient presents with   Cough    HPI [name] is here today with c/o wet productive cough with clear sputum. He did complete antibiotic therapy of azithromycin for 5 days last week as well as take Tessalon for cough with only mild improvement. Symptoms now have been for about 3 weeks per pt.  He did use chloraseptic spray which helps for a little bit until it stops working. Enough to get him to fall asleep. Cough syrup otc with no relief/ delsym. No wheezing, only sob when coughing and unable to stop. No fever. Describes cough as tickle at base of throat.  At this point cough has not improved and keeps him up at night.denies fever and or chills. Denies ear pain, sore throat and or sinus pressure.  Past Medical History:  Diagnosis Date   A-fib ([place])    Arthritis    GERD (gastroesophageal reflux disease)    H/O urinary frequency    Heart murmur    as a child   History of bronchitis    HSV infection    oral   Hypertension    improved after back pain treated with surgery   Insomnia    Insomnia    Improved with Ambien, did not tolerate melatonin.   Joint pain    Parkinson disease ([place])    Sleep apnea    no CPAP   Vertigo 10/13/2014   recurrent; trigger is cool air on left ear, improved with daily antihistamine    Past Surgical History:  Procedure Laterality Date   ACHILLES TENDON REPAIR Left    ALLOGRAFT APPLICATION Left 6/80/3212   Procedure: APPLICATION OF  INTEGRA;  Surgeon: Irene Limbo, MD;  Location: [facility];  Service: Plastics;  Laterality: Left;   BACK SURGERY     COLONOSCOPY  02/2011   Negative,[facility]   DEBRIDEMENT AND CLOSURE WOUND Left 02/25/2018   Procedure: DEBRIDEMENT LEFT LEG APPLICATION INTEGRA;  Surgeon: Irene Limbo, MD;  Location: [facility];  Service: Plastics;  Laterality: Left;   EYE SURGERY Bilateral    cataracts   INCISION AND DRAINAGE OF WOUND Left 11/12/2020   Procedure: DEBRIDEMENT LLE ULCER;  Surgeon: Irene Limbo, MD;  Location: [facility];  Service: Plastics;  Laterality: Left;   KNEE ARTHROSCOPY     L   LUMBAR FUSION     2016   LUMBAR SPINE SURGERY  03/31/2019   exploration of lumbar fusion w/L5-S1 decompression redo L4-5 fusion   SPINE SURGERY  2003   cervical fusion   TONSILLECTOMY     varicoelectomy      Family History  Problem Relation Age of Onset   Hypertension Mother    Asthma Mother    Diabetes Father    Stroke Father    Heart attack Paternal Grandfather        >55   Cancer Sister    Colon cancer Neg Hx    Prostate cancer Neg Hx     Social History   Socioeconomic History   Marital status: Married    Spouse name: Not on file   Number of children: 2   Years of education: 14   Highest education level: Not on file  Occupational History   Occupation: retired  Tobacco Use   Smoking status: Never  Smokeless tobacco: Never  Vaping Use   Vaping Use: Never used  Substance and Sexual Activity   Alcohol use: No   Drug use: No   Sexual activity: Not on file  Other Topics Concern   Not on file  Social History Narrative   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     Social Determinants of Health   Financial Resource Strain: Low Risk    Difficulty of Paying Living Expenses: Not hard at all  Food Insecurity: No Food Insecurity   Worried About Charity fundraiser in the Last Year: Never true   [place] in the Last Year: Never true  Transportation Needs: No Transportation Needs   Lack of Transportation (Medical): No   Lack of Transportation (Non-Medical): No   Physical Activity: Sufficiently Active   Days of Exercise per Week: 4 days   Minutes of Exercise per Session: 120 min  Stress: No Stress Concern Present   Feeling of Stress : Not at all  Social Connections: Not on file  Intimate Partner Violence: Not At Risk   Fear of Current or Ex-Partner: No   Emotionally Abused: No   Physically Abused: No   Sexually Abused: No    Outpatient Medications Prior to Visit  Medication Sig Dispense Refill   cetirizine (ZYRTEC) 10 MG tablet Take 10 mg by mouth daily.     diazepam (VALIUM) 5 MG tablet Take 5 mg by mouth daily as needed for anxiety.     diclofenac Sodium (VOLTAREN) 1 % GEL Apply topically 4 (four) times daily.     diltiazem (CARDIZEM CD) 240 MG 24 hr capsule TAKE 1 CAPSULE(240 MG) BY MOUTH DAILY 30 capsule 3   fluticasone (FLONASE) 50 MCG/ACT nasal spray Place 2 sprays into both nostrils daily.     Menthol, Topical Analgesic, (BIOFREEZE EX) Apply 1 application topically daily as needed (pain).     mirtazapine (REMERON) 15 MG tablet Take 1 tablet (15 mg total) by mouth at bedtime. 90 tablet 0   omeprazole (PRILOSEC) 20 MG capsule Take 20 mg by mouth daily as needed (heart burn).     Oxycodone HCl 10 MG TABS      polyethylene glycol powder (GLYCOLAX/MIRALAX) 17 GM/SCOOP powder See admin instructions.     pramipexole (MIRAPEX) 0.5 MG tablet TAKE 1 TABLET(0.5 MG) BY MOUTH THREE TIMES DAILY 270 tablet 0   propranolol (INDERAL) 20 MG tablet Take 0.5 tablets (10 mg total) by mouth 2 (two) times daily. (Patient taking differently: Take 20 mg by mouth 2 (two) times daily.)     traMADol (ULTRAM) 50 MG tablet TAKE 1 TABLET (50 MG TOTAL) BY MOUTH EVERY 12 (TWELVE) HOURS AS NEEDED. 40 tablet 0   zinc gluconate 50 MG tablet as needed.     zolpidem (AMBIEN) 5 MG tablet Take 1 tablet (5 mg total) by mouth at bedtime as needed for sleep. 30 tablet 1   acetaminophen (TYLENOL) 500 MG tablet Take 1,000 mg by mouth every 6 (six) hours as needed.      azithromycin (ZITHROMAX Z-PAK) 250 MG tablet Take 2 pills by mouth today and then 1 pill daily for 4 days 6 tablet 0   benzonatate (TESSALON) 200 MG capsule Take 1 capsule (200 mg total) by mouth  3 (three) times daily as needed for cough. 30 capsule 1   No facility-administered medications prior to visit.    Allergies  Allergen Reactions   Flecainide     Vomit    Flexeril [Cyclobenzaprine]     Constipation   Hctz [Hydrochlorothiazide]     hyponatremia   Morphine And Related Hives    Hallucinations- can tolerate oxycodone.    Promethazine     Would avoid due to Parkinsons   Sinemet [Carbidopa-Levodopa]     intolerant    ROS Review of Systems  Constitutional:  Negative for chills and fever.  HENT:  Negative for congestion, ear pain, sinus pain and sore throat.   Respiratory:  Positive for cough (WET PRODUCTIVE WITH CLEAR SPUTUM) and shortness of breath (ONLY WHEN COUGHING ALOT ESPECIALLY AT NIGHT). Negative for wheezing.   Cardiovascular:  Negative for chest pain and palpitations.     Objective:    Physical Exam Vitals reviewed.  Constitutional:      General: He is not in acute distress.    Appearance: Normal appearance. He is not ill-appearing.  HENT:     Right Ear: Tympanic membrane normal.     Left Ear: Tympanic membrane normal.     Nose: Nose normal. No congestion.     Right Turbinates: Enlarged and swollen.     Left Turbinates: Not enlarged or swollen.     Right Sinus: No maxillary sinus tenderness or frontal sinus tenderness.     Left Sinus: No maxillary sinus tenderness or frontal sinus tenderness.     Mouth/Throat:     Mouth: Mucous membranes are moist.     Tongue: No lesions.     Pharynx: No oropharyngeal exudate.     Tonsils: No tonsillar exudate.  Eyes:     Pupils: Pupils are equal, round, and reactive to light.  Cardiovascular:     Rate and Rhythm: Normal rate and regular rhythm.  Pulmonary:     Effort: Pulmonary effort is normal.     Breath sounds:  Normal breath sounds.  Neurological:     Mental Status: He is alert.    BP 122/76   Pulse 60   Temp 97.9 F (36.6 C) (Temporal)   Ht $R'6\' 3"'NX$  (1.905 m)   Wt 224 lb (101.6 kg)   SpO2 97%   BMI 28.00 kg/m  Wt Readings from Last 3 Encounters:  03/07/21 224 lb (101.6 kg)  02/10/21 222 lb (100.7 kg)  12/30/20 215 lb (97.5 kg)     Health Maintenance Due  Topic Date Due   Zoster Vaccines- Shingrix (1 of 2) Never done   Pneumonia Vaccine 13+ Years old (2 - PPSV23 if available, else PCV20) 05/01/2018    There are no preventive care reminders to display for this patient.  Lab Results  Component Value Date   TSH 3.93 12/30/2020   Lab Results  Component Value Date   WBC 8.5 12/30/2020   HGB 14.3 12/30/2020   HCT 43.1 12/30/2020   MCV 92.8 12/30/2020   PLT 209.0 12/30/2020   Lab Results  Component Value Date   NA 138 12/30/2020   K 4.4 12/30/2020   CO2 28 12/30/2020   GLUCOSE 85 12/30/2020   BUN 16 12/30/2020   CREATININE 1.28 12/30/2020   BILITOT 0.6 12/30/2020   ALKPHOS 72 12/30/2020   AST 16 12/30/2020   ALT 18 12/30/2020   PROT 6.7 12/30/2020   ALBUMIN 4.1 12/30/2020   CALCIUM 9.6 12/30/2020   ANIONGAP 12 05/17/2019  EGFR 63 07/01/2020   GFR 55.12 (L) 12/30/2020   Lab Results  Component Value Date   CHOL 151 03/23/2020   Lab Results  Component Value Date   HDL 32.80 (L) 03/23/2020   Lab Results  Component Value Date   LDLCALC 95 03/23/2020   Lab Results  Component Value Date   TRIG 117.0 03/23/2020   Lab Results  Component Value Date   CHOLHDL 5 03/23/2020   No results found for: HGBA1C    Assessment & Plan:   Problem List Items Addressed This Visit       Respiratory   Acute bronchitis - Primary    Chest x-ray and lab work ordered for today pending results we will notify patient once results are received.  Medrol Dosepak prescribed patient to take as directed continue Flonase as well as Zyrtec daily as patient is already taking patient to  follow-up in 2 to 3 days if no improvement in symptoms        Other   Acute cough    Increase oral fluids chest x-ray ordered and pending patient to follow-up if no improvement in symptoms. take Medrol Dosepak as prescribed. If negative cxr and normal cbc, consider GERD and trial increase omeprazole to 40 mg PO QD for two weeks to see if symptoms resolve.      Relevant Medications   methylPREDNISolone (MEDROL DOSEPAK) 4 MG TBPK tablet    Meds ordered this encounter  Medications   methylPREDNISolone (MEDROL DOSEPAK) 4 MG TBPK tablet    Sig: Take per package instructions    Dispense:  21 tablet    Refill:  0    Order Specific Question:   Supervising Provider    Answer:   Diona Browner, AMY E [[id number]]    Follow-up: Return in about 1 week (around 03/14/2021), or if symptoms worsen or fail to improve.    Eugenia Pancoast, FNP

## 2021-03-07 NOTE — Patient Instructions (Signed)
Stop by the lab as well as xray prior to leaving today. I will notify you of your results once received.   Increase oral fluids, rx sent for medrol dose pack. Please start and take as prescribed. Please continue daily flonase as well as zyrtec otc. Follow up if fever >101, if symptoms worsen or if symptoms are not improved in 3 days.   It was a pleasure seeing you today! Please do not hesitate to reach out with any questions and or concerns.  Regards,   Eugenia Pancoast

## 2021-03-07 NOTE — Assessment & Plan Note (Addendum)
Increase oral fluids chest x-ray ordered and pending patient to follow-up if no improvement in symptoms. take Medrol Dosepak as prescribed. If negative cxr and normal cbc, consider GERD and trial increase omeprazole to 40 mg PO QD for two weeks to see if symptoms resolve.

## 2021-03-07 NOTE — Assessment & Plan Note (Signed)
Chest x-ray and lab work ordered for today pending results we will notify patient once results are received.  Medrol Dosepak prescribed patient to take as directed continue Flonase as well as Zyrtec daily as patient is already taking patient to follow-up in 2 to 3 days if no improvement in symptoms

## 2021-03-07 NOTE — Telephone Encounter (Signed)
Called and spoke with patient advised him that his CBC is within normal limits as well as his chest x-ray.  His checks x-ray appears stable and there is no acute infection.  Advised patient to continue the Medrol Dosepak but also suggested that patient take 2 of his 20 mg of omeprazole once every morning for total of 40 mg QAM for the next 1 week or so and see if that also helps alleviate  the symptoms.  Patient again notified if no improvement in the next 5 to 7 days please make a follow-up appointment

## 2021-03-07 NOTE — Progress Notes (Deleted)
Established Patient Office Visit  Subjective:  Patient ID: [name], male    DOB: 1945-06-26  Age: 75 y.o. MRN: [id number]  CC: No chief complaint on file.   HPI [name] is here today with c/o   Past Medical History:  Diagnosis Date   A-fib ([facility])    Arthritis    GERD (gastroesophageal reflux disease)    H/O urinary frequency    Heart murmur    as a child   History of bronchitis    HSV infection    oral   Hypertension    improved after back pain treated with surgery   Insomnia    Insomnia    Improved with Ambien, did not tolerate melatonin.   Joint pain    Parkinson disease ([facility])    Sleep apnea    no CPAP   Vertigo 10/13/2014   recurrent; trigger is cool air on left ear, improved with daily antihistamine    Past Surgical History:  Procedure Laterality Date   ACHILLES TENDON REPAIR Left    ALLOGRAFT APPLICATION Left 11/12/2020   Procedure: APPLICATION OF  INTEGRA;  Surgeon: Glenna Fellows, MD;  Location: [facility];  Service: Plastics;  Laterality: Left;   BACK SURGERY     COLONOSCOPY  02/2011   Negative,[facility]   DEBRIDEMENT AND CLOSURE WOUND Left 02/25/2018   Procedure: DEBRIDEMENT LEFT LEG APPLICATION INTEGRA;  Surgeon: Glenna Fellows, MD;  Location: [facility];  Service: Plastics;  Laterality: Left;   EYE SURGERY Bilateral    cataracts   INCISION AND DRAINAGE OF WOUND Left 11/12/2020   Procedure: DEBRIDEMENT LLE ULCER;  Surgeon: Glenna Fellows, MD;  Location: [facility];  Service: Plastics;  Laterality: Left;   KNEE ARTHROSCOPY     L   LUMBAR FUSION     2016   LUMBAR SPINE SURGERY  03/31/2019   exploration of lumbar fusion w/L5-S1 decompression redo L4-5 fusion   SPINE SURGERY  2003   cervical fusion   TONSILLECTOMY     varicoelectomy      Family History  Problem Relation Age of Onset   Hypertension Mother    Asthma Mother    Diabetes Father    Stroke Father    Heart  attack Paternal Grandfather        >55   Cancer Sister    Colon cancer Neg Hx    Prostate cancer Neg Hx     Social History   Socioeconomic History   Marital status: Married    Spouse name: Not on file   Number of children: 2   Years of education: 14   Highest education level: Not on file  Occupational History   Occupation: retired  Tobacco Use   Smoking status: Never   Smokeless tobacco: Never  Vaping Use   Vaping Use: Never used  Substance and Sexual Activity   Alcohol use: No   Drug use: No   Sexual activity: Not on file  Other Topics Concern   Not on file  Social History Narrative   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     Social Determinants of Health   Financial Resource Strain: Low Risk    Difficulty of Paying Living Expenses: Not hard  at all  Food Insecurity: No Food Insecurity   Worried About Programme researcher, broadcasting/film/video in the Last Year: Never true   Ran Out of Food in the Last Year: Never true  Transportation Needs: No Transportation Needs   Lack of Transportation (Medical): No   Lack of Transportation (Non-Medical): No  Physical Activity: Sufficiently Active   Days of Exercise per Week: 4 days   Minutes of Exercise per Session: 120 min  Stress: No Stress Concern Present   Feeling of Stress : Not at all  Social Connections: Not on file  Intimate Partner Violence: Not At Risk   Fear of Current or Ex-Partner: No   Emotionally Abused: No   Physically Abused: No   Sexually Abused: No    Outpatient Medications Prior to Visit  Medication Sig Dispense Refill   acetaminophen (TYLENOL) 500 MG tablet Take 1,000 mg by mouth every 6 (six) hours as needed.     azithromycin (ZITHROMAX Z-PAK) 250 MG tablet Take 2 pills by mouth today and then 1 pill daily for 4 days 6  tablet 0   benzonatate (TESSALON) 200 MG capsule Take 1 capsule (200 mg total) by mouth 3 (three) times daily as needed for cough. 30 capsule 1   diazepam (VALIUM) 5 MG tablet Take 5 mg by mouth daily as needed for anxiety.     diclofenac Sodium (VOLTAREN) 1 % GEL Apply topically 4 (four) times daily.     diltiazem (CARDIZEM CD) 240 MG 24 hr capsule TAKE 1 CAPSULE(240 MG) BY MOUTH DAILY 30 capsule 3   fluticasone (FLONASE) 50 MCG/ACT nasal spray Place 2 sprays into both nostrils daily.     Menthol, Topical Analgesic, (BIOFREEZE EX) Apply 1 application topically daily as needed (pain).     mirtazapine (REMERON) 15 MG tablet Take 1 tablet (15 mg total) by mouth at bedtime. 90 tablet 0   omeprazole (PRILOSEC) 20 MG capsule Take 20 mg by mouth daily as needed (heart burn).     Oxycodone HCl 10 MG TABS      polyethylene glycol powder (GLYCOLAX/MIRALAX) 17 GM/SCOOP powder See admin instructions.     pramipexole (MIRAPEX) 0.5 MG tablet TAKE 1 TABLET(0.5 MG) BY MOUTH THREE TIMES DAILY 270 tablet 0   propranolol (INDERAL) 20 MG tablet Take 0.5 tablets (10 mg total) by mouth 2 (two) times daily. (Patient taking differently: Take 20 mg by mouth 2 (two) times daily.)     traMADol (ULTRAM) 50 MG tablet TAKE 1 TABLET (50 MG TOTAL) BY MOUTH EVERY 12 (TWELVE) HOURS AS NEEDED. 40 tablet 0   zinc gluconate 50 MG tablet as needed.     zolpidem (AMBIEN) 5 MG tablet Take 1 tablet (5 mg total) by mouth at bedtime as needed for sleep. 30 tablet 1   No facility-administered medications prior to visit.    Allergies  Allergen Reactions   Flecainide     Vomit    Flexeril [Cyclobenzaprine]     Constipation   Hctz [Hydrochlorothiazide]     hyponatremia   Morphine And Related Hives    Hallucinations- can tolerate oxycodone.    Promethazine     Would avoid due to Parkinsons   Sinemet [Carbidopa-Levodopa]     intolerant    ROS Review of Systems    Objective:    Physical Exam Constitutional:      General:  He is awake. He is not in acute distress.    Appearance: Normal appearance. He is not ill-appearing.  HENT:  Right Ear: Tympanic membrane normal.     Left Ear: Tympanic membrane normal.     Nose: Nose normal.     Right Turbinates: Not enlarged or swollen.     Left Turbinates: Not enlarged or swollen.     Right Sinus: No maxillary sinus tenderness or frontal sinus tenderness.     Left Sinus: No maxillary sinus tenderness or frontal sinus tenderness.     Mouth/Throat:     Mouth: Mucous membranes are moist.     Pharynx: No pharyngeal swelling, oropharyngeal exudate or posterior oropharyngeal erythema.  Eyes:     Extraocular Movements: Extraocular movements intact.     Pupils: Pupils are equal, round, and reactive to light.  Cardiovascular:     Rate and Rhythm: Normal rate and regular rhythm.  Pulmonary:     Effort: Pulmonary effort is normal.     Breath sounds: Normal breath sounds. No wheezing.  Neurological:     Mental Status: He is alert.    There were no vitals taken for this visit. Wt Readings from Last 3 Encounters:  02/10/21 222 lb (100.7 kg)  12/30/20 215 lb (97.5 kg)  11/12/20 213 lb 10 oz (96.9 kg)     Health Maintenance Due  Topic Date Due   Zoster Vaccines- Shingrix (1 of 2) Never done   Pneumonia Vaccine 15+ Years old (2 - PPSV23 if available, else PCV20) 05/01/2018    There are no preventive care reminders to display for this patient.  Lab Results  Component Value Date   TSH 3.93 12/30/2020   Lab Results  Component Value Date   WBC 8.5 12/30/2020   HGB 14.3 12/30/2020   HCT 43.1 12/30/2020   MCV 92.8 12/30/2020   PLT 209.0 12/30/2020   Lab Results  Component Value Date   NA 138 12/30/2020   K 4.4 12/30/2020   CO2 28 12/30/2020   GLUCOSE 85 12/30/2020   BUN 16 12/30/2020   CREATININE 1.28 12/30/2020   BILITOT 0.6 12/30/2020   ALKPHOS 72 12/30/2020   AST 16 12/30/2020   ALT 18 12/30/2020   PROT 6.7 12/30/2020   ALBUMIN 4.1 12/30/2020    CALCIUM 9.6 12/30/2020   ANIONGAP 12 05/17/2019   EGFR 63 07/01/2020   GFR 55.12 (L) 12/30/2020   Lab Results  Component Value Date   CHOL 151 03/23/2020   Lab Results  Component Value Date   HDL 32.80 (L) 03/23/2020   Lab Results  Component Value Date   LDLCALC 95 03/23/2020   Lab Results  Component Value Date   TRIG 117.0 03/23/2020   Lab Results  Component Value Date   CHOLHDL 5 03/23/2020   No results found for: HGBA1C    Assessment & Plan:   Problem List Items Addressed This Visit   None   No orders of the defined types were placed in this encounter.   Follow-up: No follow-ups on file.    Eugenia Pancoast, FNP

## 2021-03-15 MED ORDER — PREDNISONE 20 MG PO TABS: ORAL_TABLET | ORAL | 0 refills | Status: DC

## 2021-03-15 MED ORDER — FLUTICASONE-SALMETEROL 250-50 MCG/ACT IN AEPB: 1.0000 | INHALATION_SPRAY | Freq: Two times a day (BID) | RESPIRATORY_TRACT | Status: AC

## 2021-03-15 MED ORDER — COMBIVENT RESPIMAT 20-100 MCG/ACT IN AERS
1.0000 | INHALATION_SPRAY | Freq: Four times a day (QID) | RESPIRATORY_TRACT | 0 refills | Status: DC | PRN
Start: 2021-03-15 — End: 2021-03-15

## 2021-03-15 NOTE — Addendum Note (Signed)
Addended by: Eugenia Pancoast on: 03/15/2021 11:58 AM   Modules accepted: Orders

## 2021-03-15 NOTE — Telephone Encounter (Signed)
Pt called in stated that the RX Ipratropium-Albuterol (COMBIVENT RESPIMAT) 20-100 MCG/ACT AERS respimat cost $285. And cant afford it  is there anything else that can be proscribe . Please Advise [phone]

## 2021-03-15 NOTE — Telephone Encounter (Signed)
Pt called in stated he still have the on going cough wants to know if he can have something called in to the pharmacy . Please Advise [phone]

## 2021-03-15 NOTE — Telephone Encounter (Signed)
Pt returning your call

## 2021-03-15 NOTE — Telephone Encounter (Signed)
Patient was called and he informed me that the omeprazole did not help much at 40 mg. Has started coughing and doesn't seem to want to stop. No matter what the patient tries it doesn't seem to help him, only stops when patient is in [place] and he will no be going there to December. Patient does take an allergy pill and it's not helping either. Patient stated that he did good on the medrol dos pak but that was only for 3 days. Please advise.

## 2021-03-15 NOTE — Addendum Note (Signed)
Addended by: Eugenia Pancoast on: 03/15/2021 03:40 PM   Modules accepted: Orders

## 2021-03-15 NOTE — Telephone Encounter (Addendum)
Patient called and informed that he has a new Rx that can be picked up. Patient informed if the Rx is too expensive to please let us know so it can be fixed. Patietn stated understanding and appreciation for the call.

## 2021-03-15 NOTE — Telephone Encounter (Signed)
Tried calling the patient and he did not answer. LVM for patient to call back.   

## 2021-03-15 NOTE — Telephone Encounter (Signed)
Please call patient and let him know that I have sent in another prescription for a different inhaler that hopefully will be more affordable.  Please advise him if it comes back as expensive to please again let me know as I do not want him spending that kind of money on an inhaler.  I apologize for the inconvenience

## 2021-03-15 NOTE — Telephone Encounter (Signed)
Patient called and informed of his recommendations from Eugenia Pancoast, NP. Patient stated understanding and appreciation patient stated he was going to go and pick up medications and would start them today.

## 2021-03-15 NOTE — Telephone Encounter (Signed)
Please call patient and let him know that I have sent an inhaler into his pharmacy that he can use if needed for shortness of breath and/or wheezing throughout the day as directed.  Also let him know that considering this is going on for 3 to 4 weeks I have referred him to pulmonary.  He should hear from referrals department , in about 1 week if he does not hear from them please have him contact our office  Lastly, since he did have some improvement with the steroid even though it was a short-term therapy I have prescribed prednisone tablets that he will take for the next 10 days as prescribed  Please have patient follow-up in 24 to 48 hours if absolutely no improvement.  If shortness of breath worsens and/or chest pain palpitations develop please go to emergency room immediately

## 2021-03-16 ENCOUNTER — Telehealth: Payer: Self-pay | Admitting: Family Medicine

## 2021-03-16 NOTE — Telephone Encounter (Signed)
Called patient he did not answer, left a detailed message per his DPR on his cell number. Patient was informed that [place] reordered his Rx. If patient has any questions he was left a number to call back.

## 2021-03-16 NOTE — Telephone Encounter (Signed)
Pt called stating that an inhaler was supposed to be called in. Pt states that he was at the pharmacy and they don't have one called in. Please advise.

## 2021-03-22 DIAGNOSIS — Z981 Arthrodesis status: Secondary | ICD-10-CM | POA: Diagnosis not present

## 2021-03-24 NOTE — Progress Notes (Signed)
Assessment/Plan:   1.  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  He really doesn't want to add more medication or change it right now.    -Follows regularly with dermatology.  Understands that Parkinson's slightly increases risk of melanoma.  2.  Atrial fibrillation  -On Xarelto.  Follows with Dr. Einar Gip  3.  Insomnia  -Continue mirtazapine, 15 mg at bed   4.  Back pain  -Sees Dr. Arnoldo Morale.  Just saw him on March 22, 2021.  He has another next Thursday.   Subjective:   [name] was seen today in follow up for Parkinsons disease.  My previous records were reviewed prior to todays visit as well as outside records available to me. Pt denies falls.  Pt denies lightheadedness, near syncope.  No hallucinations.  Patient chooses to use mirtazapine nightly and it helps.  He thinks that it really helps that way.  He is on pramipexole, but declines levodopa for now.  No compulsive behaviors or sleep attacks.  Has been following with Dr. Arnoldo Morale for back pain.  Has received epidural injections.  He has another next Thursday.  Current prescribed movement disorder medications: Pramipexole 0.5 mg three times per day Mirtazapine, 15 mg at bed  PREVIOUS MEDICATIONS: Levodopa (patient thought he had chest pain and palpitations with it, but no longer there after was diagnosed with new onset A. fib)  ALLERGIES:   Allergies  Allergen Reactions   Flecainide     Vomit    Flexeril [Cyclobenzaprine]     Constipation   Hctz [Hydrochlorothiazide]     hyponatremia   Morphine And Related Hives    Hallucinations- can tolerate oxycodone.    Promethazine     Would avoid due to Parkinsons   Sinemet [Carbidopa-Levodopa]     intolerant    CURRENT MEDICATIONS:  Outpatient Encounter Medications as of 03/28/2021  Medication Sig   cetirizine  (ZYRTEC) 10 MG tablet Take 10 mg by mouth daily.   diazepam (VALIUM) 5 MG tablet Take 5 mg by mouth daily as needed for anxiety.   diclofenac Sodium (VOLTAREN) 1 % GEL Apply topically 4 (four) times daily.   diltiazem (CARDIZEM CD) 240 MG 24 hr capsule TAKE 1 CAPSULE(240 MG) BY MOUTH DAILY   fluticasone (FLONASE) 50 MCG/ACT nasal spray Place 2 sprays into both nostrils daily.   Menthol, Topical Analgesic, (BIOFREEZE EX) Apply 1 application topically daily as needed (pain).   mirtazapine (REMERON) 15 MG tablet Take 1 tablet (15 mg total) by mouth at bedtime.   omeprazole (PRILOSEC) 20 MG capsule Take 20 mg by mouth daily as needed (heart burn).   Oxycodone HCl 10 MG TABS    polyethylene glycol powder (GLYCOLAX/MIRALAX) 17 GM/SCOOP powder See admin instructions.   pramipexole (MIRAPEX) 0.5 MG tablet TAKE 1 TABLET(0.5 MG) BY MOUTH THREE TIMES DAILY   predniSONE (DELTASONE) 20 MG tablet Take two tablets (total 40 mg) once daily for ten days   propranolol (INDERAL) 20 MG tablet Take 0.5 tablets (10 mg total) by mouth 2 (two) times daily. (Patient taking differently: Take 20 mg by mouth 2 (two) times daily.)   traMADol (ULTRAM) 50 MG tablet TAKE 1 TABLET (50 MG TOTAL) BY MOUTH EVERY 12 (TWELVE) HOURS AS NEEDED.   zinc gluconate 50 MG tablet as needed.   zolpidem (AMBIEN) 5 MG tablet Take 1 tablet (5  mg total) by mouth at bedtime as needed for sleep.   Facility-Administered Encounter Medications as of 03/28/2021  Medication   fluticasone-salmeterol (ADVAIR) 250-50 MCG/ACT inhaler 1 puff    Objective:   PHYSICAL EXAMINATION:    VITALS:   Vitals:   03/28/21 0817  BP: 117/78  Pulse: 68  SpO2: 91%  Weight: 229 lb 6.4 oz (104.1 kg)  Height: 6\' 3"  (1.905 m)     GEN:  The patient appears stated age and is in NAD. HEENT:  Normocephalic, atraumatic.  The mucous membranes are moist. The superficial temporal arteries are without ropiness or tenderness. CV:  RRR Lungs:  CTAB Neck/HEME:  There  are no carotid bruits bilaterally.  Neurological examination:  Orientation: The patient is alert and oriented x3. Cranial nerves: There is good facial symmetry with facial hypomimia. The speech is fluent and clear. Soft palate rises symmetrically and there is no tongue deviation. Hearing is intact to conversational tone. Sensation: Sensation is intact to light touch throughout Motor: Strength is at least antigravity x4.  Movement examination: Tone: There is nl tone in the UE/LE Abnormal movements: occ tremor in the [place] (same as previous) Coordination:  There is no significant decremation today Gait and Station: The patient has no difficulty arising out of a deep-seated chair without the use of the hands. The patient's stride length is decreased and he is forward flexed (he is having back pain)  I have reviewed and interpreted the following labs independently    Chemistry      Component Value Date/Time   NA 138 12/30/2020 1138   NA 139 07/01/2020 1021   K 4.4 12/30/2020 1138   CL 102 12/30/2020 1138   CO2 28 12/30/2020 1138   BUN 16 12/30/2020 1138   BUN 12 07/01/2020 1021   CREATININE 1.28 12/30/2020 1138   CREATININE 1.32 (H) 01/14/2019 0958      Component Value Date/Time   CALCIUM 9.6 12/30/2020 1138   ALKPHOS 72 12/30/2020 1138   AST 16 12/30/2020 1138   ALT 18 12/30/2020 1138   BILITOT 0.6 12/30/2020 1138       Lab Results  Component Value Date   WBC 6.4 03/07/2021   HGB 13.7 03/07/2021   HCT 40.7 03/07/2021   MCV 90.7 03/07/2021   PLT 235.0 03/07/2021    Lab Results  Component Value Date   TSH 3.93 12/30/2020     Cc:  Tonia Ghent, MD

## 2021-03-28 ENCOUNTER — Other Ambulatory Visit: Payer: Self-pay

## 2021-03-28 ENCOUNTER — Ambulatory Visit: Payer: Medicare Other | Admitting: Neurology

## 2021-03-28 ENCOUNTER — Encounter: Payer: Self-pay | Admitting: Neurology

## 2021-03-28 VITALS — BP 117/78 | HR 68 | Ht 75.0 in | Wt 229.4 lb

## 2021-03-28 DIAGNOSIS — G20 Parkinson's disease: Secondary | ICD-10-CM

## 2021-03-28 NOTE — Patient Instructions (Signed)
Online Resources for Power over Parkinson's Group November 2022  Local [place] Online Groups  Power over [employer] Group :   Power Over Parkinson's Patient Education Group will be Wednesday, November 9th-*Hybrid meting*- in person at [place] location and via [facility] at 2:00 pm.   Upcoming Power over Parkinson's Meetings:  2nd Wednesdays of the month at 2 pm:  November 9th, December 14th [place] at [email or web address] if interested in participating in this online group Parkinson's Care Partners Group:    3rd Mondays, Contact Misty Paladino Atypical Parkinsonian Patient Group:   4th Wednesdays, Contact Misty Paladino If you are interested in participating in these online groups with Misty, please contact her directly for how to join those meetings.  Her contact information is [email or web address].   [place]:  [email or web address] PD Health at Home continues:  Mindfulness Mondays, Expert Briefing Tuesdays, Wellness Wednesdays, Take Time Thursdays, Fitness Fridays -Listings for June 2022 are on the website Upcoming Webinar:  Expert Briefing:  Let's Talk about Dementia.  Wednesday, November 2nd  at 1 pm. Register for Armed forces operational officer) at [email or web address]  Please check out their website to sign up for emails and see their full online offerings  [place]:  [email or web address]  Upcoming Webinar:   2022 in Review:  Progress Toward Better Treatment and Prevention.  Thursday, November 17th at 12 noon Check out additional information on their website to see their full online offerings  [place]:  [email or web address] Upcoming Webinar:  NOH (Neurogenic Orthostatic Hypotension):  What it is, How to Know if you Have it, and How to Manage it.  Tuesday, November 15th at 3 pm.  Webinar Series:  Living with Parkinson's Meetup.    Third Thursdays of each month at 3 pm; next one is November 17th Care Partner Monthly Meetup.  With Robin Searing Phinney.  First Tuesday of each month, 2 pm Joy Breaks:  First Wednesday of each month, 2-3 pm. There will be art, doodling, making, crafting, listening, laughing, stories, and everything in between. No art experience necessary. No supplies required. Just show up for joy!  Register on their website. Check out additional information to Live Well Today on their website  Parkinson and Movement Disorders (PMD) Alliance:  www.[email or web address] [employer] Online:  Online Education Events Sign up for emails, which are sent weekly to give you updates on programming and online offerings  Parkinson's Association of the [place]:  www.[email or web address] Information on online support groups, education events, and online exercises including Yoga, Parkinson's exercises and more-LOTS of information on links to PD resources and online events Virtual Support Group through Parkinson's Association of the [place]; next one is scheduled for Wednesday, November 2nd  at 2 pm.  (These are typically scheduled for the 1st Wednesday of the month at 2 pm).  Visit website for details.  Additional links for movement activities: Parkinson's DRUMMING Classes/Music Therapy with Doylene Canning:  This is a returning class and it's FREE!  2nd Mondays, continuing November 14th.  Contact *Misty Taylor-Paladino at [email or web address] or Doylene Canning at [phone] or [email or web address]  PWR! Moves Classes at [place].  Wednesdays 10 and 11 am.  Contact Amy Marriott, PT [email or web address] if interested. Here is a link to the PWR!Moves classes on Zoom from [place] - Daily Mon-Sat at 10:00. Via Zoom, FREE and open to all.  There is also a link below via Facebook if you use that  platform. [email or web address] Parkinson's Wellness Recovery (PWR! Moves)  www.[email or web address] Info on  the PWR! Virtual Experience:  You will have access to our expertise through self-assessment, guided plans that start with the PD-specific fundamentals, educational content, tips, Q&A with an expert, and a growing Art therapist of PD-specific pre-recorded and live exercise classes of varying types and intensity - both physical and cognitive! If that is not enough, we offer 1:1 wellness consultations (in-person or virtual) to personalize your PWR! Research scientist (medical).  [place] Fridays:  As part of the PD Health @ Home program, this free video series focuses each week on one aspect of fitness designed to support people living with Parkinson's.  These weekly videos highlight the [place] recent fitness guidelines for people with Parkinson's disease.  HollywoodSale.dk Dance for PD website is offering free, live-stream classes throughout the week, as well as links to [employer] of classes:  [email or web address] Dance for Parkinson's Class:  [place].  Free offering for people with Parkinson's and care partners; virtual class.  For more information, contact [phone] or email Ruffin Frederick at [email or web address] Virtual dance and Pilates for Parkinson's classes: Click on the Community Tab> Parkinson's Movement Initiative Tab.  To register for classes and for more information, visit [email or web address] and click the "community" tab.  [employer] Parkinson's Cycling Classes  [employer]: 1pm on Fridays-Live classes at [employer] ([employer] at  [email or web address] or [phone]) [employer]: Virtual Classes Mondays and Thursdays Jeanette Caprice classes Tuesday, Wednesday and Thursday (contact [place] at [email or web address]  or [phone]) [name] Varied levels of classes are offered Mondays, Tuesdays and Thursdays at [facility].  To observe a class or for more information, call [phone] or email [email or web address] [employer]: Online Caregiver Education Opportunities:  [email or web address]/[email or web address]/[email or web address]-group.  You may also contact Vickki Muff at [email or web address] or [phone].   *Multiple opportunities below, as November is Caregiver Month!* A Guide to Physical and Mental Fitness for Family Caregivers.  Wednesday, November 9th, 12:30-2 pm at [facility] of You!  Tuesday, November 15th, 11:30-12:45.  [facility], [place] 10-21-1992 Understanding Care Options and Advanced Care Planning.  Monday, November 21st, 4:30-5:45.  [place], [place] above to register. Well-Spring Navigator:  [address] program, a free service to help individuals and families through the journey of determining care for older adults.  The "Navigator" is a [employer], Education officer, museum, who will speak with a prospective client and/or loved ones to provide an assessment of the situation and a set of recommendations for a personalized care plan -- all free of charge, and whether [facility] [employer] offers the needed service or not. If the need is not a service we provide, we are well-connected with reputable programs in town that we can refer you to.  www.well-[email or web address] or to speak with the Navigator, call [phone].

## 2021-03-28 NOTE — Progress Notes (Deleted)
Subjective:   [name] is a 75 y.o. male who presents for Medicare Annual/Subsequent preventive examination.  I connected with Celine Mans today by telephone and verified that I am speaking with the correct person using two identifiers. Location patient: home Location provider: work Persons participating in the virtual visit: patient, Marine scientist.    I discussed the limitations, risks, security and privacy concerns of performing an evaluation and management service by telephone and the availability of in person appointments. I also discussed with the patient that there may be a patient responsible charge related to this service. The patient expressed understanding and verbally consented to this telephonic visit.    Interactive audio and video telecommunications were attempted between this provider and patient, however failed, due to patient having technical difficulties OR patient did not have access to video capability.  We continued and completed visit with audio only.  Some vital signs may be absent or patient reported.   Time Spent with patient on telephone encounter: *** minutes  Review of Systems           Objective:    There were no vitals filed for this visit. There is no height or weight on file to calculate BMI.  Advanced Directives 03/28/2021 11/12/2020 11/09/2020 09/22/2020 04/06/2020 03/26/2020 10/13/2019  Does Patient Have a Medical Advance Directive? Yes Yes Yes Yes Yes Yes Yes  Type of Advance Directive Living will [place];Living will [place];Living will [place];Living will;Out of facility DNR (pink MOST or yellow form) [place];Living will [place];Living will [place];Living will  Does patient want to make changes to medical advance directive? - No - Patient declined No - Patient declined - - - -  Copy of [place] in  Chart? - No - copy requested - - - No - copy requested -  Would patient like information on creating a medical advance directive? - - - - - - -    Current Medications (verified) Outpatient Encounter Medications as of 03/30/2021  Medication Sig   cetirizine (ZYRTEC) 10 MG tablet Take 10 mg by mouth daily.   diazepam (VALIUM) 5 MG tablet Take 5 mg by mouth daily as needed for anxiety.   diclofenac Sodium (VOLTAREN) 1 % GEL Apply topically 4 (four) times daily.   diltiazem (CARDIZEM CD) 240 MG 24 hr capsule TAKE 1 CAPSULE(240 MG) BY MOUTH DAILY   fluticasone (FLONASE) 50 MCG/ACT nasal spray Place 2 sprays into both nostrils daily.   Menthol, Topical Analgesic, (BIOFREEZE EX) Apply 1 application topically daily as needed (pain).   mirtazapine (REMERON) 15 MG tablet Take 1 tablet (15 mg total) by mouth at bedtime.   omeprazole (PRILOSEC) 20 MG capsule Take 20 mg by mouth daily as needed (heart burn).   Oxycodone HCl 10 MG TABS    polyethylene glycol powder (GLYCOLAX/MIRALAX) 17 GM/SCOOP powder See admin instructions.   pramipexole (MIRAPEX) 0.5 MG tablet TAKE 1 TABLET(0.5 MG) BY MOUTH THREE TIMES DAILY   predniSONE (DELTASONE) 20 MG tablet Take two tablets (total 40 mg) once daily for ten days   propranolol (INDERAL) 20 MG tablet Take 0.5 tablets (10 mg total) by mouth 2 (two) times daily. (Patient taking differently: Take 20 mg by mouth 2 (two) times daily.)   traMADol (ULTRAM) 50 MG tablet TAKE 1 TABLET (50 MG TOTAL) BY MOUTH EVERY 12 (TWELVE) HOURS AS NEEDED.   zinc gluconate 50 MG tablet as  needed.   zolpidem (AMBIEN) 5 MG tablet Take 1 tablet (5 mg total) by mouth at bedtime as needed for sleep.   Facility-Administered Encounter Medications as of 03/30/2021  Medication   fluticasone-salmeterol (ADVAIR) 250-50 MCG/ACT inhaler 1 puff    Allergies (verified) Flecainide, Flexeril [cyclobenzaprine], Hctz [hydrochlorothiazide], Morphine and related, Promethazine, and Sinemet  [carbidopa-levodopa]   History: Past Medical History:  Diagnosis Date   A-fib ([place])    Arthritis    GERD (gastroesophageal reflux disease)    H/O urinary frequency    Heart murmur    as a child   History of bronchitis    HSV infection    oral   Hypertension    improved after back pain treated with surgery   Insomnia    Insomnia    Improved with Ambien, did not tolerate melatonin.   Joint pain    Parkinson disease ([place])    Sleep apnea    no CPAP   Vertigo 10/13/2014   recurrent; trigger is cool air on left ear, improved with daily antihistamine   Past Surgical History:  Procedure Laterality Date   ACHILLES TENDON REPAIR Left    ALLOGRAFT APPLICATION Left 9/93/7169   Procedure: APPLICATION OF  INTEGRA;  Surgeon: Irene Limbo, MD;  Location: [facility];  Service: Plastics;  Laterality: Left;   BACK SURGERY     COLONOSCOPY  02/2011   Negative,[facility]   DEBRIDEMENT AND CLOSURE WOUND Left 02/25/2018   Procedure: DEBRIDEMENT LEFT LEG APPLICATION INTEGRA;  Surgeon: Irene Limbo, MD;  Location: [facility];  Service: Plastics;  Laterality: Left;   EYE SURGERY Bilateral    cataracts   INCISION AND DRAINAGE OF WOUND Left 11/12/2020   Procedure: DEBRIDEMENT LLE ULCER;  Surgeon: Irene Limbo, MD;  Location: [facility];  Service: Plastics;  Laterality: Left;   KNEE ARTHROSCOPY     L   LUMBAR FUSION     2016   LUMBAR SPINE SURGERY  03/31/2019   exploration of lumbar fusion w/L5-S1 decompression redo L4-5 fusion   SPINE SURGERY  2003   cervical fusion   TONSILLECTOMY     varicoelectomy     Family History  Problem Relation Age of Onset   Hypertension Mother    Asthma Mother    Diabetes Father    Stroke Father    Heart attack Paternal Grandfather        >55   Cancer Sister    Colon cancer Neg Hx    Prostate cancer Neg Hx    Social History   Socioeconomic History   Marital status: Married    Spouse name:  Not on file   Number of children: 2   Years of education: 14   Highest education level: Not on file  Occupational History   Occupation: retired  Tobacco Use   Smoking status: Never   Smokeless tobacco: Never  Vaping Use   Vaping Use: Never used  Substance and Sexual Activity   Alcohol use: No   Drug use: No   Sexual activity: Not on file  Other Topics Concern   Not on file  Social History Narrative   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     Social Determinants of Health   Financial  Resource Strain: Not on file  Food Insecurity: Not on file  Transportation Needs: Not on file  Physical Activity: Not on file  Stress: Not on file  Social Connections: Not on file    Tobacco Counseling Counseling given: Not Answered   Clinical Intake:                 Diabetic? No         Activities of Daily Living In your present state of health, do you have any difficulty performing the following activities: 11/12/2020  Hearing? N  Vision? N  Difficulty concentrating or making decisions? N  Walking or climbing stairs? N  Dressing or bathing? N  Some recent data might be hidden    Patient Care Team: Tonia Ghent, MD as PCP - General (Family Medicine) Tat, Eustace Quail, DO as Consulting Physician (Neurology) Newman Pies, MD as Consulting Physician (Neurosurgery)  Indicate any recent Medical Services you may have received from other than [facility] providers in the past year (date may be approximate).     Assessment:   This is a routine wellness examination for Wenzel.  Hearing/Vision screen No results found.  Dietary issues and exercise activities discussed:     Goals Addressed   None    Depression Screen PHQ 2/9 Scores 03/30/2020 03/26/2020 03/09/2020  04/11/2017 11/30/2015 01/01/2015 10/13/2014  PHQ - 2 Score 0 0 0 0 0 0 0  PHQ- 9 Score 0 0 - 0 - - -    Fall Risk Fall Risk  03/28/2021 09/22/2020 04/06/2020 03/30/2020 03/26/2020  Falls in the past year? 0 0 1 0 0  Comment - - - - -  Number falls in past yr: 0 0 0 0 0  Injury with Fall? 0 0 0 0 0  Risk for fall due to : - - - - Medication side effect  Follow up - - - Falls evaluation completed Falls evaluation completed;Falls prevention discussed    FALL RISK PREVENTION PERTAINING TO THE HOME:  Any stairs in or around the home? {YES/NO:21197} If so, are there any without handrails? {YES/NO:21197} Home free of loose throw rugs in walkways, pet beds, electrical cords, etc? {YES/NO:21197} Adequate lighting in your home to reduce risk of falls? {YES/NO:21197}  ASSISTIVE DEVICES UTILIZED TO PREVENT FALLS:  Life alert? {YES/NO:21197} Use of a cane, walker or w/c? {YES/NO:21197} Grab bars in the bathroom? {YES/NO:21197} Shower chair or bench in shower? {YES/NO:21197} Elevated toilet seat or a handicapped toilet? {YES/NO:21197}  TIMED UP AND GO:  Was the test performed? No , visit completed over the phone.     Cognitive Function: MMSE - Mini Mental State Exam 03/26/2020 04/11/2017 10/13/2014  Not completed: Refused - Unable to complete  Orientation to time - 5 -  Orientation to Place - 5 -  Registration - 3 -  Attention/ Calculation - 0 -  Recall - 3 -  Language- name 2 objects - 0 -  Language- repeat - 1 -  Language- follow 3 step command - 3 -  Language- read & follow direction - 0 -  Write a sentence - 0 -  Copy design - 0 -  Total score - 20 -        Immunizations Immunization History  Administered Date(s) Administered   Pneumococcal Conjugate-13 05/01/2017    TDAP status: Due, Education has been provided regarding the importance of this vaccine. Advised may receive this vaccine at local pharmacy or Health Dept. Aware to provide a copy of the vaccination  record if  obtained from local pharmacy or Health Dept. Verbalized acceptance and understanding.  {Flu Vaccine status:2101806}  {Pneumococcal vaccine status:2101807}  {Covid-19 vaccine status:2101808}  Qualifies for Shingles Vaccine? Yes   Zostavax completed No   {Shingrix Completed?:2101804}  Screening Tests Health Maintenance  Topic Date Due   Zoster Vaccines- Shingrix (1 of 2) Never done   Pneumonia Vaccine 3+ Years old (2 - PPSV23 if available, else PCV20) 05/01/2018   COVID-19 Vaccine (1) 04/11/2021 (Originally 12/11/1945)   INFLUENZA VACCINE  07/15/2021 (Originally 11/15/2020)   TETANUS/TDAP  03/26/2024 (Originally 06/12/1964)   COLONOSCOPY (Pts 45-63yrs Insurance coverage will need to be confirmed)  01/21/2030   Hepatitis C Screening  Completed   HPV VACCINES  Aged Out    Health Maintenance  Health Maintenance Due  Topic Date Due   Zoster Vaccines- Shingrix (1 of 2) Never done   Pneumonia Vaccine 21+ Years old (2 - PPSV23 if available, else PCV20) 05/01/2018    Colorectal cancer screening: Type of screening: Colonoscopy. Completed 01/22/20. Repeat every 10 years  Lung Cancer Screening: (Low Dose CT Chest recommended if Age 87-80 years, 30 pack-year currently smoking OR have quit w/in 15years.) does not qualify.     Additional Screening:  Hepatitis C Screening: does qualify; Completed 04/17/98  Vision Screening: Recommended annual ophthalmology exams for early detection of glaucoma and other disorders of the eye. Is the patient up to date with their annual eye exam?  {YES/NO:21197} Who is the provider or what is the name of the office in which the patient attends annual eye exams? *** If pt is not established with a provider, would they like to be referred to a provider to establish care? {YES/NO:21197}.   Dental Screening: Recommended annual dental exams for proper oral hygiene  Community Resource Referral / Chronic Care Management: CRR required this visit?   {YES/NO:21197}  CCM required this visit?  {YES/NO:21197}     Plan:     I have personally reviewed and noted the following in the patient's chart:   Medical and social history Use of alcohol, tobacco or illicit drugs  Current medications and supplements including opioid prescriptions. {Opioid Prescriptions:[phone]} Functional ability and status Nutritional status Physical activity Advanced directives List of other physicians Hospitalizations, surgeries, and ER visits in previous 12 months Vitals Screenings to include cognitive, depression, and falls Referrals and appointments  In addition, I have reviewed and discussed with patient certain preventive protocols, quality metrics, and best practice recommendations. A written personalized care plan for preventive services as well as general preventive health recommendations were provided to patient.  Due to this being a telephonic visit, the after visit summary with patients personalized plan was offered to patient via mail or my-chart. ***Patient declined at this time./ Patient would like to access on my-chart/ per request, patient was mailed a copy of AVS./ Patient preferred to pick up at office at next visit.     Loma Messing, LPN  30/12/2328   Nurse Health Advisor  Nurse Notes: none

## 2021-03-30 ENCOUNTER — Ambulatory Visit: Payer: PPO

## 2021-04-07 ENCOUNTER — Encounter: Payer: Self-pay | Admitting: Family Medicine

## 2021-04-07 ENCOUNTER — Ambulatory Visit (INDEPENDENT_AMBULATORY_CARE_PROVIDER_SITE_OTHER): Payer: Medicare Other | Admitting: Family Medicine

## 2021-04-07 ENCOUNTER — Other Ambulatory Visit: Payer: Self-pay

## 2021-04-07 VITALS — BP 110/60 | HR 75 | Temp 97.8°F | Ht 75.0 in | Wt 232.1 lb

## 2021-04-07 DIAGNOSIS — R609 Edema, unspecified: Secondary | ICD-10-CM | POA: Insufficient documentation

## 2021-04-07 DIAGNOSIS — B353 Tinea pedis: Secondary | ICD-10-CM | POA: Diagnosis not present

## 2021-04-07 DIAGNOSIS — L249 Irritant contact dermatitis, unspecified cause: Secondary | ICD-10-CM | POA: Diagnosis not present

## 2021-04-07 DIAGNOSIS — R600 Localized edema: Secondary | ICD-10-CM | POA: Insufficient documentation

## 2021-04-07 LAB — CBC WITH DIFFERENTIAL/PLATELET
Basophils Absolute: 0.1 10*3/uL (ref 0.0–0.1)
Basophils Relative: 1.2 % (ref 0.0–3.0)
Eosinophils Absolute: 0.3 10*3/uL (ref 0.0–0.7)
Eosinophils Relative: 5.6 % — ABNORMAL HIGH (ref 0.0–5.0)
HCT: 40.8 % (ref 39.0–52.0)
Hemoglobin: 13.7 g/dL (ref 13.0–17.0)
Lymphocytes Relative: 26.4 % (ref 12.0–46.0)
Lymphs Abs: 1.6 10*3/uL (ref 0.7–4.0)
MCHC: 33.4 g/dL (ref 30.0–36.0)
MCV: 91 fl (ref 78.0–100.0)
Monocytes Absolute: 0.6 10*3/uL (ref 0.1–1.0)
Monocytes Relative: 9.2 % (ref 3.0–12.0)
Neutro Abs: 3.6 10*3/uL (ref 1.4–7.7)
Neutrophils Relative %: 57.6 % (ref 43.0–77.0)
Platelets: 203 10*3/uL (ref 150.0–400.0)
RBC: 4.49 Mil/uL (ref 4.22–5.81)
RDW: 13.1 % (ref 11.5–15.5)
WBC: 6.2 10*3/uL (ref 4.0–10.5)

## 2021-04-07 LAB — COMPREHENSIVE METABOLIC PANEL
ALT: 30 U/L (ref 0–53)
AST: 21 U/L (ref 0–37)
Albumin: 3.9 g/dL (ref 3.5–5.2)
Alkaline Phosphatase: 61 U/L (ref 39–117)
BUN: 10 mg/dL (ref 6–23)
CO2: 26 mEq/L (ref 19–32)
Calcium: 8.9 mg/dL (ref 8.4–10.5)
Chloride: 102 mEq/L (ref 96–112)
Creatinine, Ser: 1.25 mg/dL (ref 0.40–1.50)
GFR: 56.6 mL/min — ABNORMAL LOW (ref >60.00)
Glucose, Bld: 103 mg/dL — ABNORMAL HIGH (ref 70–99)
Potassium: 4.3 mEq/L (ref 3.5–5.1)
Sodium: 136 mEq/L (ref 135–145)
Total Bilirubin: 0.7 mg/dL (ref 0.2–1.2)
Total Protein: 6.2 g/dL (ref 6.0–8.3)

## 2021-04-07 LAB — TSH: TSH: 4.34 u[IU]/mL (ref 0.35–5.50)

## 2021-04-07 LAB — BRAIN NATRIURETIC PEPTIDE: Pro B Natriuretic peptide (BNP): 34 pg/mL (ref 0.0–100.0)

## 2021-04-07 MED ORDER — TRIAMCINOLONE ACETONIDE 0.5 % EX CREA: 1.0000 "application " | TOPICAL_CREAM | Freq: Two times a day (BID) | CUTANEOUS | 0 refills | Status: DC

## 2021-04-07 MED ORDER — CLOTRIMAZOLE 1 % EX CREA: 1.0000 "application " | TOPICAL_CREAM | Freq: Two times a day (BID) | CUTANEOUS | 0 refills | Status: DC

## 2021-04-07 NOTE — Progress Notes (Signed)
Patient ID: [name], male    DOB: 05-28-1945, 75 y.o.   MRN: [id number]  This visit was conducted in person.  BP 110/60    Pulse 75    Temp 97.8 F (36.6 C) (Temporal)    Ht 6\' 3"  (1.905 m)    Wt 232 lb 2 oz (105.3 kg)    SpO2 98%    BMI 29.01 kg/m    CC:  Chief Complaint  Patient presents with   Foot Swelling    Bilateral    Subjective:   HPI: [name] is a 75 y.o. male  patient of Dr. Josefine Class with history of HTN , afib with RVR, Parkinson's disease, with dementia presenting on 04/07/2021 for Foot Swelling (Bilateral)   Feet had 1 week of burning and redness between toes and on heel. Feet were peeling.Noted red papules on left leg near surgical site. No discharge.  Thought it was fungal infection.  Recently sprayed feet and legs with tinactin... hit surgical site... caused burning to increase. Has had swelling since.  Initially BP was up some so took a prn olmesartan and  BP came back to normal.  Has been applying cortisone cream... helped some.   No fever No new chest pain, no SOB. Mild tickly cough at night.  Some decrease endurance in last few month, mild.  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 Had surgical debridement and bony necrosis. in summer 2022. Last OV with Cardiology  Dr. Einar Gip 09/2020 reviewed 02/2020 ECHO nml LVF   Wt Readings from Last 3 Encounters:  04/07/21 232 lb 2 oz (105.3 kg)  03/28/21 229 lb 6.4 oz (104.1 kg)  03/07/21 224 lb (101.6 kg)    BP Readings from Last 3 Encounters:  04/07/21 110/60  03/28/21 117/78  03/07/21 122/76        Relevant past medical, surgical, family and social history reviewed and updated as indicated. Interim medical history since our last visit reviewed. Allergies and medications reviewed and updated. Outpatient Medications Prior to Visit  Medication Sig Dispense Refill   cetirizine (ZYRTEC) 10 MG tablet Take 10 mg by mouth daily.     diazepam (VALIUM) 5 MG  tablet Take 5 mg by mouth daily as needed for anxiety.     diclofenac Sodium (VOLTAREN) 1 % GEL Apply topically 4 (four) times daily.     diltiazem (CARDIZEM CD) 240 MG 24 hr capsule TAKE 1 CAPSULE(240 MG) BY MOUTH DAILY 30 capsule 3   fluticasone (FLONASE) 50 MCG/ACT nasal spray Place 2 sprays into both nostrils daily.     Menthol, Topical Analgesic, (BIOFREEZE EX) Apply 1 application topically daily as needed (pain).     mirtazapine (REMERON) 15 MG tablet Take 1 tablet (15 mg total) by mouth at bedtime. 90 tablet 0   omeprazole (PRILOSEC) 20 MG capsule Take 20 mg by mouth daily as needed (heart burn).     Oxycodone HCl 10 MG TABS      polyethylene glycol powder (GLYCOLAX/MIRALAX) 17 GM/SCOOP powder See admin instructions.     pramipexole (MIRAPEX) 0.5 MG tablet TAKE 1 TABLET(0.5 MG) BY MOUTH THREE TIMES DAILY 270 tablet 0   propranolol (INDERAL) 20 MG tablet Take 20 mg by mouth 2 (two) times daily.     traMADol (ULTRAM) 50 MG tablet TAKE 1 TABLET (50 MG TOTAL) BY MOUTH EVERY 12 (TWELVE) HOURS AS NEEDED. 40 tablet 0   zinc gluconate 50 MG tablet as  needed.     zolpidem (AMBIEN) 5 MG tablet Take 1 tablet (5 mg total) by mouth at bedtime as needed for sleep. 30 tablet 1   predniSONE (DELTASONE) 20 MG tablet Take two tablets (total 40 mg) once daily for ten days 20 tablet 0   propranolol (INDERAL) 20 MG tablet Take 0.5 tablets (10 mg total) by mouth 2 (two) times daily. (Patient taking differently: Take 20 mg by mouth 2 (two) times daily.)     Facility-Administered Medications Prior to Visit  Medication Dose Route Frequency Provider Last Rate Last Admin   fluticasone-salmeterol (ADVAIR) 250-50 MCG/ACT inhaler 1 puff  1 puff Inhalation BID Phebe Colla, Tabitha, FNP         Per HPI unless specifically indicated in ROS section below Review of Systems  Constitutional:  Negative for fatigue and fever.  HENT:  Negative for ear pain.   Eyes:  Negative for pain.  Respiratory:  Negative for cough and  shortness of breath.   Cardiovascular:  Positive for leg swelling. Negative for chest pain and palpitations.  Gastrointestinal:  Negative for abdominal pain.  Genitourinary:  Negative for dysuria.  Musculoskeletal:  Negative for arthralgias.  Skin:  Positive for rash.  Neurological:  Negative for syncope, light-headedness and headaches.  Psychiatric/Behavioral:  Negative for dysphoric mood.        Objective:  BP 110/60    Pulse 75    Temp 97.8 F (36.6 C) (Temporal)    Ht 6\' 3"  (1.905 m)    Wt 232 lb 2 oz (105.3 kg)    SpO2 98%    BMI 29.01 kg/m   Wt Readings from Last 3 Encounters:  04/07/21 232 lb 2 oz (105.3 kg)  03/28/21 229 lb 6.4 oz (104.1 kg)  03/07/21 224 lb (101.6 kg)      Physical Exam Constitutional:      Appearance: He is well-developed.  HENT:     Head: Normocephalic.     Right Ear: Hearing normal.     Left Ear: Hearing normal.     Nose: Nose normal.  Neck:     Thyroid: No thyroid mass or thyromegaly.     Vascular: No carotid bruit.     Trachea: Trachea normal.  Cardiovascular:     Rate and Rhythm: Normal rate and regular rhythm.     Pulses: Normal pulses.     Heart sounds: Heart sounds not distant. No murmur heard.   No friction rub. No gallop.     Comments: No peripheral edema Pulmonary:     Effort: Pulmonary effort is normal. No respiratory distress.     Breath sounds: Normal breath sounds.  Skin:    General: Skin is warm and dry.     Findings: No rash.  Psychiatric:        Speech: Speech normal.        Behavior: Behavior normal.        Thought Content: Thought content normal.      Results for orders placed or performed in visit on 03/07/21  CBC with Differential/Platelet  Result Value Ref Range   WBC 6.4 4.0 - 10.5 K/uL   RBC 4.49 4.22 - 5.81 Mil/uL   Hemoglobin 13.7 13.0 - 17.0 g/dL   HCT 40.7 39.0 - 52.0 %   MCV 90.7 78.0 - 100.0 fl   MCHC 33.8 30.0 - 36.0 g/dL   RDW 13.4 11.5 - 15.5 %   Platelets 235.0 150.0 - 400.0 K/uL    Neutrophils Relative % 57.2  43.0 - 77.0 %   Lymphocytes Relative 26.3 12.0 - 46.0 %   Monocytes Relative 10.9 3.0 - 12.0 %   Eosinophils Relative 4.9 0.0 - 5.0 %   Basophils Relative 0.7 0.0 - 3.0 %   Neutro Abs 3.7 1.4 - 7.7 K/uL   Lymphs Abs 1.7 0.7 - 4.0 K/uL   Monocytes Absolute 0.7 0.1 - 1.0 K/uL   Eosinophils Absolute 0.3 0.0 - 0.7 K/uL   Basophils Absolute 0.0 0.0 - 0.1 K/uL    This visit occurred during the SARS-CoV-2 public health emergency.  Safety protocols were in place, including screening questions prior to the visit, additional usage of staff PPE, and extensive cleaning of exam room while observing appropriate contact time as indicated for disinfecting solutions.   COVID 19 screen:  No recent travel or known exposure to COVID19 The patient denies respiratory symptoms of COVID 19 at this time. The importance of social distancing was discussed today.   Assessment and Plan    Problem List Items Addressed This Visit     Irritant dermatitis    Secondary to fungal spray Apply triamcinolone to leg for possible irritant dermatitis.      Peripheral edema - Primary    ? If secondary to irritant dermatitis/reaction to fungal spray or tinea pedis or venous insufficiency.  Eval with lab for other secondary cause. Elevate feet.  No S/S of bacterial infection, return and ER precautions reviewed.      Relevant Orders   CBC with Differential/Platelet (Completed)   Comprehensive metabolic panel (Completed)   TSH (Completed)   Brain natriuretic peptide (Completed)   Tinea pedis of both feet    Apply topical fungal medication to feet for tinea pedis.      Relevant Medications   clotrimazole (LOTRIMIN) 1 % cream    Meds ordered this encounter  Medications   triamcinolone cream (KENALOG) 0.5 %    Sig: Apply 1 application topically 2 (two) times daily. Use on leg rash.    Dispense:  30 g    Refill:  0   clotrimazole (LOTRIMIN) 1 % cream    Sig: Apply 1 application  topically 2 (two) times daily.    Dispense:  30 g    Refill:  0     Eliezer Lofts, MD

## 2021-04-07 NOTE — Patient Instructions (Addendum)
Apply triamcinolone to leg for possible irritant dermatitis.  Apply topical fungal medication to feet for tinea pedis.  Elevated legs above heart when sitting, walk as much as tolerated.  Please stop at the lab to have labs drawn.  We will call with lab results later today or tommorrow.

## 2021-04-12 ENCOUNTER — Telehealth: Payer: Self-pay | Admitting: Family Medicine

## 2021-04-12 NOTE — Telephone Encounter (Signed)
Patient saw Dr. Diona Browner and labs have not been resulted yet. I will send to Dr. Diona Browner to review.

## 2021-04-12 NOTE — Telephone Encounter (Signed)
Lab results discussed with Mr. Ducre via telephone.  See Result Note from 04/07/2021.

## 2021-04-12 NOTE — Telephone Encounter (Signed)
Pt called in wants  to discuss his lab result would like a call back # [phone]

## 2021-04-14 ENCOUNTER — Telehealth: Payer: Self-pay | Admitting: Family Medicine

## 2021-04-14 ENCOUNTER — Other Ambulatory Visit: Payer: Self-pay | Admitting: Family

## 2021-04-14 NOTE — Telephone Encounter (Signed)
Per patient and TE note; [name] was sending Advair in for patient but [facility] never received the rx. Can you send in for patient to [facility] [place]? Rx is not on his active rx list for me to resend.

## 2021-04-14 NOTE — Telephone Encounter (Signed)
Patient needs advair resent in that you were sending in for him to [facility] [place]. [facility] never received it and we can not see where this was ever sent for him. It shows in one of the TE notes but not in EMR so I'm not sure whats happened.

## 2021-04-14 NOTE — Telephone Encounter (Signed)
1.Medication Requested:fluticasone-salmeterol (ADVAIR) 250-50 MCG/ACT inhaler 1 puff   [[id number]]   2. Pharmacy (Name, [place],  City):[id number] [place], [address]  3. On Med List: N  4. Last Visit with PCP: 9.15.22  5. Next visit date with PCP: Not scheduled   Agent: Please be advised that RX refills may take up to 3 business days. We ask that you follow-up with your pharmacy.

## 2021-04-14 NOTE — Telephone Encounter (Signed)
Please check with patient/clarify this order.  I don't see in the EMR where he was ever on this med.  Thanks.

## 2021-04-15 ENCOUNTER — Telehealth: Payer: Self-pay | Admitting: Family

## 2021-04-15 ENCOUNTER — Other Ambulatory Visit: Payer: Self-pay | Admitting: Family

## 2021-04-15 DIAGNOSIS — J45901 Unspecified asthma with (acute) exacerbation: Secondary | ICD-10-CM

## 2021-04-15 MED ORDER — FLOVENT DISKUS 100 MCG/ACT IN AEPB: INHALATION_SPRAY | RESPIRATORY_TRACT | 2 refills | Status: DC

## 2021-04-15 NOTE — Telephone Encounter (Signed)
Left a voicemail on patient's machine to give a call back I am calling in regards to the inhaler prescription as I wanted to follow-up and see if patient is still experiencing cough and or if there has been improvement.  I also asked to see if pt has made appt with pulmonary, awaiting call back.  3:30 pm: pt called back.  Patient states that he is improving with the cough and it is almost completely rectified however at nighttime when he goes to lie down he does feel a little bit of a wheeze when he lies down in bed.  He does state that he saw Dr. Diona Browner on 04/07/2021 and states that his BNP was okay with lab work to rule out congestive heart failure but eosinophils were slightly elevated which may be an allergic response and/or asthma response.  Patient would like to continue with inhaler at this time.  I stated that I will send patient Flovent 100 mcg for a trial run to see if this allows him to not have nighttime exacerbations, sent to requested pharmacy.  Patient is currently in route to [place] for 3 months, upon return I have advised him to follow-up with his primary care provider.  He does state that he canceled the consult with the pulmonologist because he did not feel that it was necessary.

## 2021-04-15 NOTE — Telephone Encounter (Signed)
Patient stated that he isn't coughing a lot and that he is all better he said it was here one day and gone the next. Patient stated his appreciation for the call.

## 2021-04-28 ENCOUNTER — Telehealth: Payer: Self-pay

## 2021-04-28 NOTE — Telephone Encounter (Signed)
Please find out if the swelling is in 1 leg or both legs, if the swelling came after a long travel, he has had pain in his leg, his the leg angry looking, reddish looking and inflamed.  If above symptoms are not present and swelling is in both legs and it is not painful, please send for Lasix 20 mg daily for 5 days, no refills and asked him to avoid eating salty food and keep his foot elevated.

## 2021-04-28 NOTE — Telephone Encounter (Signed)
Tried calling patient no answer left a vm

## 2021-04-29 ENCOUNTER — Other Ambulatory Visit: Payer: Self-pay

## 2021-04-29 MED ORDER — FUROSEMIDE 20 MG PO TABS: 20.0000 mg | ORAL_TABLET | Freq: Every day | ORAL | 0 refills | Status: DC

## 2021-04-29 NOTE — Telephone Encounter (Signed)
Patient stated his swelling is in both legs and the swelling has been happening on and off for the past 3 months. Patient did say they drove to [facility] but this swelling has been happening before the trip. Patient has no pain in his legs and no reddish looking or inflamed. Patient also mention his stomach feels "swollen" he feels bloated doesn't understand why because he is not eating too much. Patient is aware of new med and Lasix has been sent in as directed.

## 2021-05-11 ENCOUNTER — Telehealth: Payer: Self-pay

## 2021-05-11 NOTE — Telephone Encounter (Signed)
Patient call and stated that since he started the Diltiazem 240mg , he has been feeling tired and wore out and his feet are swelling still. He wants to know if he can just do half a tablet for now, because he said that when he used to take the 180mg  he was not having these issues. Please advise.

## 2021-05-11 NOTE — Telephone Encounter (Signed)
Please send for 180 mg dose, until then he can use half a tablet but his blood pressure is high hence he needs to be on at least 180 mg.  Weight loss will be a tremendous help

## 2021-05-12 ENCOUNTER — Other Ambulatory Visit: Payer: Self-pay

## 2021-05-12 DIAGNOSIS — I10 Essential (primary) hypertension: Secondary | ICD-10-CM

## 2021-05-12 MED ORDER — DILTIAZEM HCL ER COATED BEADS 180 MG PO CP24: ORAL_CAPSULE | ORAL | 0 refills | Status: DC

## 2021-05-12 NOTE — Telephone Encounter (Signed)
Spoke with patient, he cannot cut it in half because it is a capsule, but he will pick up the new prescription (180mg ).

## 2021-05-13 NOTE — Assessment & Plan Note (Signed)
?   If secondary to irritant dermatitis/reaction to fungal spray or tinea pedis or venous insufficiency.  Eval with lab for other secondary cause. Elevate feet.  No S/S of bacterial infection, return and ER precautions reviewed.

## 2021-05-13 NOTE — Assessment & Plan Note (Signed)
Secondary to fungal spray Apply triamcinolone to leg for possible irritant dermatitis.

## 2021-05-13 NOTE — Assessment & Plan Note (Signed)
Apply topical fungal medication to feet for tinea pedis.

## 2021-05-20 ENCOUNTER — Other Ambulatory Visit: Payer: Self-pay | Admitting: Cardiology

## 2021-05-20 ENCOUNTER — Telehealth: Payer: Self-pay | Admitting: Student

## 2021-05-20 DIAGNOSIS — R600 Localized edema: Secondary | ICD-10-CM

## 2021-05-20 MED ORDER — POTASSIUM CHLORIDE ER 10 MEQ PO TBCR: 10.0000 meq | EXTENDED_RELEASE_TABLET | Freq: Every day | ORAL | 3 refills | Status: DC

## 2021-05-20 MED ORDER — FUROSEMIDE 20 MG PO TABS: 20.0000 mg | ORAL_TABLET | ORAL | 3 refills | Status: DC

## 2021-05-20 NOTE — Telephone Encounter (Signed)
I have sent Rx for lasix and potassium for 4 months

## 2021-05-20 NOTE — Telephone Encounter (Signed)
Pt is calling because he has been struggling with some leg swelling. He states that his BP has been good and he has been using the restroom frequently but still having the leg swelling. Requesting a call back from nurse and he is out of town and can not make it in the office.

## 2021-05-20 NOTE — Telephone Encounter (Signed)
Pt is calling because he has been struggling with some leg swelling. He states that his BP has been good around 120/74 with a pulse around 83 and he has been using the restroom frequently but still having the leg swelling. Pt said his energy feels low, no SOB. He is out of town and can not make it in the office. Pt is in between insurance companies currently and he will not be back from [place] until April. Please advise.

## 2021-05-20 NOTE — Telephone Encounter (Signed)
ICD-10-CM   1. Bilateral leg edema  R60.0 furosemide (LASIX) 20 MG tablet    potassium chloride (KLOR-CON) 10 MEQ tablet      Meds ordered this encounter  Medications   furosemide (LASIX) 20 MG tablet    Sig: Take 1 tablet (20 mg total) by mouth every morning. For 5 days    Dispense:  30 tablet    Refill:  3   potassium chloride (KLOR-CON) 10 MEQ tablet    Sig: Take 1 tablet (10 mEq total) by mouth daily.    Dispense:  30 tablet    Refill:  3

## 2021-05-20 NOTE — Telephone Encounter (Signed)
Called and spoke to pt, pt voiced understanding.

## 2021-06-03 ENCOUNTER — Other Ambulatory Visit: Payer: Self-pay | Admitting: Student

## 2021-06-03 ENCOUNTER — Telehealth: Payer: Self-pay

## 2021-06-03 DIAGNOSIS — R600 Localized edema: Secondary | ICD-10-CM | POA: Diagnosis not present

## 2021-06-03 DIAGNOSIS — R2243 Localized swelling, mass and lump, lower limb, bilateral: Secondary | ICD-10-CM | POA: Diagnosis not present

## 2021-06-03 DIAGNOSIS — R5383 Other fatigue: Secondary | ICD-10-CM | POA: Diagnosis not present

## 2021-06-03 MED ORDER — FUROSEMIDE 20 MG PO TABS: 20.0000 mg | ORAL_TABLET | Freq: Two times a day (BID) | ORAL | 3 refills | Status: DC

## 2021-06-03 MED ORDER — POTASSIUM CHLORIDE ER 10 MEQ PO TBCR: 10.0000 meq | EXTENDED_RELEASE_TABLET | Freq: Two times a day (BID) | ORAL | 3 refills | Status: DC

## 2021-06-03 NOTE — Telephone Encounter (Signed)
I spoke with pt and he is presently at [facility] in [place]. Sending note to Dr Damita Dunnings who is out of office and Janett Billow CMA and Romilda Garret NP who is in office.

## 2021-06-03 NOTE — Telephone Encounter (Signed)
Will await urgent care report.  Please try to get a copy of that next week and also check on patient about his situation then.  Thanks.

## 2021-06-03 NOTE — Progress Notes (Signed)
ON-CALL CARDIOLOGY 06/03/21  Patient's name: [name].   MRN: [id number].    DOB: 1945/06/02 Primary care provider: Tonia Ghent, MD.  Interaction regarding this patient's care today: Patient called and continues to have bilateral leg edema presently taking Lasix 20 mg daily. Denies SOB or DOE.   Impression:   ICD-10-CM   1. Bilateral leg edema  R60.0 furosemide (LASIX) 20 MG tablet    Basic metabolic panel    Brain natriuretic peptide    potassium chloride (KLOR-CON) 10 MEQ tablet      Meds ordered this encounter  Medications   furosemide (LASIX) 20 MG tablet    Sig: Take 1 tablet (20 mg total) by mouth 2 (two) times daily. For 5 days    Dispense:  30 tablet    Refill:  3   potassium chloride (KLOR-CON) 10 MEQ tablet    Sig: Take 1 tablet (10 mEq total) by mouth 2 (two) times daily.    Dispense:  30 tablet    Refill:  3    Orders Placed This Encounter  Procedures   Basic metabolic panel   Brain natriuretic peptide    Recommendations: Will increase Lasix to 20 mg BID for the next 5 days, then return to once daily and have BMP and BNP done in 5 days.   Telephone encounter total time: 5 minutes     Alethia Berthold, PA-C 06/03/2021, 5:28 PM Office: [phone]

## 2021-06-03 NOTE — Telephone Encounter (Signed)
[facility] Day - Client TELEPHONE ADVICE RECORD [facility] Patient Name: [name] Gender: Male DOB: 14-Mar-1946 Age: 76 Y 11 M 22 D Return Phone Number: [phone] (Primary), [phone] (Secondary) Address: City/ State/ Zip: [place] Client [facility] - Client Client Site Media - Day Provider Renford Dills - MD Contact Type Call Who Is Calling Patient / Member / Family / Caregiver Call Type Triage / Clinical Relationship To Patient Self Return Phone Number [phone] (Primary) Chief Complaint Leg Swelling And Edema Reason for Call Symptomatic / Request for Health Information Initial Comment Caller states is w/Dr. Josefine Class office - pt stated that he is retaining fluid, legs are swollen and having a hard time going to the bathroom. [place] Not Listed [facility] Translation No Nurse Assessment Nurse: D'Heur Lucia Gaskins, RN, Adrienne Date/Time (Eastern Time): 06/03/2021 1:30:18 PM Confirm and document reason for call. If symptomatic, describe symptoms. ---Caller states he is retaining fluid, legs are swollen up to the knee and he is having a hard time going to the bathroom. He is having burning w/ urination and passing less than normal. Started a couple of weeks ago, happening off & on. BP has been good as per caller. Edema is pitting. No fever. Does the patient have any new or worsening symptoms? ---Yes Will a triage be completed? ---Yes Related visit to physician within the last 2 weeks? ---No Does the PT have any chronic conditions? (i.e. diabetes, asthma, this includes High risk factors for pregnancy, etc.) ---Yes List chronic conditions. ---Parkinsons, HTN, A-fib, heartburn Is this a behavioral health or substance abuse call? ---No Guidelines Guideline Title Affirmed Question Affirmed Notes Nurse Date/Time Eilene Ghazi Time) Urination Pain - Male Side (flank) or lower back  pain present D'Heur Lucia Gaskins, RN, Vincente Liberty 06/03/2021 1:34:14 PM Leg Swelling and Edema [1] MODERATE leg swelling (e.g., swelling extends up to knees) [place], RN, [place] 06/03/2021 1:39:32 PM PLEASE NOTE: All timestamps contained within this report are represented as [place] Standard Time. CONFIDENTIALTY NOTICE: This fax transmission is intended only for the addressee. It contains information that is legally privileged, confidential or otherwise protected from use or disclosure. If you are not the intended recipient, you are strictly prohibited from reviewing, disclosing, copying using or disseminating any of this information or taking any action in reliance on or regarding this information. If you have received this fax in error, please notify us immediately by telephone so that we can arrange for its return to [place]. Phone: [phone], Toll-Free: [phone], Fax: [phone] Page: 2 of 2 Call Id: [id number] Guidelines Guideline Title Affirmed Question Affirmed Notes Nurse Date/Time Eilene Ghazi Time) [2] new-onset or worsening Disp. Time Eilene Ghazi Time) Disposition Final User 06/03/2021 1:38:35 PM See HCP within 4 Hours (or PCP triage) D'Heur Lucia Gaskins, RN, [place] 06/03/2021 1:44:32 PM See PCP within 24 Hours Yes D'Heur Lucia Gaskins, RN, Vincente Liberty Caller Disagree/Comply Comply Caller Understands Yes PreDisposition Call Doctor Care Advice Given Per Guideline SEE HCP (OR PCP TRIAGE) WITHIN 4 HOURS: * IF OFFICE WILL BE OPEN: You need to be seen within the next 3 or 4 hours. Call your doctor (or NP/PA) now or as soon as the office opens. CALL BACK IF: * You become worse CARE ADVICE given per Urination Pain - Male (Adult) guideline. SEE PCP WITHIN 24 HOURS: * IF OFFICE WILL BE OPEN: You need to be examined within the next 24 hours. Call your doctor (or NP/PA) when the office opens and make an appointment.  LEG SWELLING OR EDEMA: * Elevate your legs or try to lie down one or two times  a day for 20 minutes. * Avoid socks with an elastic band at the top. * Wear comfortable shoes. CALL BACK IF: * Breathing difficulty or chest pain occurs * You become worse CARE ADVICE given per Leg Swelling and Edema (Adult) guideline. Comments User: Vincente Liberty, D'Heur Lucia Gaskins, RN Date/Time Eilene Ghazi Time): 06/03/2021 1:35:11 PM Caller has chronic back pain. He uses a TENS unit often. Referrals REFERRED TO PCP OFFICE GO TO FACILITY OTHER - SPECIF

## 2021-06-06 NOTE — Telephone Encounter (Signed)
Sent records request form to [facility] in [place]. Called and spoke with patient and the swelling is starting to slowly come down. Patients cardiologist advised patient to take lasix 20 mg BID x 5 days. UC could not find anything wrong and stated labs were normal.

## 2021-06-08 NOTE — Telephone Encounter (Signed)
Noted. Thanks.

## 2021-06-13 ENCOUNTER — Telehealth: Payer: Self-pay | Admitting: Student

## 2021-06-13 ENCOUNTER — Other Ambulatory Visit: Payer: Self-pay

## 2021-06-13 DIAGNOSIS — R600 Localized edema: Secondary | ICD-10-CM

## 2021-06-13 MED ORDER — FUROSEMIDE 20 MG PO TABS: 20.0000 mg | ORAL_TABLET | Freq: Two times a day (BID) | ORAL | 3 refills | Status: DC

## 2021-06-13 MED ORDER — POTASSIUM CHLORIDE ER 10 MEQ PO TBCR: 10.0000 meq | EXTENDED_RELEASE_TABLET | Freq: Two times a day (BID) | ORAL | 3 refills | Status: DC

## 2021-06-13 NOTE — Telephone Encounter (Signed)
Refill has been sent.  °

## 2021-06-13 NOTE — Telephone Encounter (Signed)
Pt req refill on below meds  furosemide (LASIX) 20 MG tablet  potassium chloride (KLOR-CON) 10 MEQ tablet  Pt was suppose to double up on this medication but he said that he is still having swelling in his lower extremities   Pt needs meds sent to [facility]

## 2021-06-29 ENCOUNTER — Telehealth: Payer: Self-pay | Admitting: Student

## 2021-06-29 ENCOUNTER — Other Ambulatory Visit: Payer: Self-pay

## 2021-06-29 DIAGNOSIS — R600 Localized edema: Secondary | ICD-10-CM

## 2021-06-29 MED ORDER — FUROSEMIDE 20 MG PO TABS: 20.0000 mg | ORAL_TABLET | Freq: Two times a day (BID) | ORAL | 3 refills | Status: DC

## 2021-06-29 NOTE — Telephone Encounter (Signed)
Pt needs a refill on a medication for fluid control  ?

## 2021-06-29 NOTE — Telephone Encounter (Signed)
Ok done

## 2021-07-07 ENCOUNTER — Encounter: Payer: Self-pay | Admitting: Cardiology

## 2021-07-08 ENCOUNTER — Other Ambulatory Visit: Payer: Self-pay

## 2021-07-08 DIAGNOSIS — R600 Localized edema: Secondary | ICD-10-CM

## 2021-07-08 MED ORDER — POTASSIUM CHLORIDE ER 10 MEQ PO TBCR: 10.0000 meq | EXTENDED_RELEASE_TABLET | Freq: Two times a day (BID) | ORAL | 3 refills | Status: DC

## 2021-07-08 MED ORDER — FUROSEMIDE 20 MG PO TABS: 20.0000 mg | ORAL_TABLET | Freq: Two times a day (BID) | ORAL | 3 refills | Status: DC

## 2021-07-15 ENCOUNTER — Ambulatory Visit (INDEPENDENT_AMBULATORY_CARE_PROVIDER_SITE_OTHER): Payer: PPO | Admitting: Family Medicine

## 2021-07-15 VITALS — BP 126/72 | HR 86 | Temp 97.7°F | Ht 75.0 in | Wt 218.2 lb

## 2021-07-15 DIAGNOSIS — R5383 Other fatigue: Secondary | ICD-10-CM | POA: Diagnosis not present

## 2021-07-15 DIAGNOSIS — E039 Hypothyroidism, unspecified: Secondary | ICD-10-CM

## 2021-07-15 DIAGNOSIS — R609 Edema, unspecified: Secondary | ICD-10-CM

## 2021-07-15 DIAGNOSIS — I480 Paroxysmal atrial fibrillation: Secondary | ICD-10-CM | POA: Diagnosis not present

## 2021-07-15 DIAGNOSIS — M170 Bilateral primary osteoarthritis of knee: Secondary | ICD-10-CM | POA: Diagnosis not present

## 2021-07-15 LAB — BRAIN NATRIURETIC PEPTIDE: Pro B Natriuretic peptide (BNP): 27 pg/mL (ref 0.0–100.0)

## 2021-07-15 LAB — COMPREHENSIVE METABOLIC PANEL
ALT: 13 U/L (ref 0–53)
AST: 15 U/L (ref 0–37)
Albumin: 4.5 g/dL (ref 3.5–5.2)
Alkaline Phosphatase: 74 U/L (ref 39–117)
BUN: 13 mg/dL (ref 6–23)
CO2: 30 mEq/L (ref 19–32)
Calcium: 9.4 mg/dL (ref 8.4–10.5)
Chloride: 101 mEq/L (ref 96–112)
Creatinine, Ser: 1.43 mg/dL (ref 0.40–1.50)
GFR: 48.07 mL/min — ABNORMAL LOW (ref >60.00)
Glucose, Bld: 91 mg/dL (ref 70–99)
Potassium: 4.7 mEq/L (ref 3.5–5.1)
Sodium: 138 mEq/L (ref 135–145)
Total Bilirubin: 0.8 mg/dL (ref 0.2–1.2)
Total Protein: 6.6 g/dL (ref 6.0–8.3)

## 2021-07-15 LAB — CBC WITH DIFFERENTIAL/PLATELET
Basophils Absolute: 0 10*3/uL (ref 0.0–0.1)
Basophils Relative: 0.4 % (ref 0.0–3.0)
Eosinophils Absolute: 0.2 10*3/uL (ref 0.0–0.7)
Eosinophils Relative: 2.2 % (ref 0.0–5.0)
HCT: 42.1 % (ref 39.0–52.0)
Hemoglobin: 14.2 g/dL (ref 13.0–17.0)
Lymphocytes Relative: 18.6 % (ref 12.0–46.0)
Lymphs Abs: 1.3 10*3/uL (ref 0.7–4.0)
MCHC: 33.7 g/dL (ref 30.0–36.0)
MCV: 89.4 fl (ref 78.0–100.0)
Monocytes Absolute: 0.5 10*3/uL (ref 0.1–1.0)
Monocytes Relative: 7.4 % (ref 3.0–12.0)
Neutro Abs: 5.2 10*3/uL (ref 1.4–7.7)
Neutrophils Relative %: 71.4 % (ref 43.0–77.0)
Platelets: 226 10*3/uL (ref 150.0–400.0)
RBC: 4.71 Mil/uL (ref 4.22–5.81)
RDW: 12.9 % (ref 11.5–15.5)
WBC: 7.3 10*3/uL (ref 4.0–10.5)

## 2021-07-15 LAB — T3, FREE: T3, Free: 3 pg/mL (ref 2.3–4.2)

## 2021-07-15 LAB — T4, FREE: Free T4: 0.86 ng/dL (ref 0.60–1.60)

## 2021-07-15 LAB — TSH: TSH: 3.5 u[IU]/mL (ref 0.35–5.50)

## 2021-07-15 NOTE — Assessment & Plan Note (Signed)
Acute, worsening control ? ?We will evaluate with labs.  This may be secondary to deconditioning and inactivity. ?

## 2021-07-15 NOTE — Assessment & Plan Note (Addendum)
Acute, worsening ? ?No signs and symptoms of DVT or pulmonary embolus. ? ?He has significant varicose veins suggesting venous insufficiency.  His swelling seems to improve with elevating legs but then returns.  He has not been responsive to Lasix up to 40 mg daily.   When this is occurring in the setting of fatigue I  will evaluate with labs.  Encouraged him to wear compression hose elevate legs above heart and increase activity. ? ?The swelling may be a result of his decreased activity in setting of back and knee pain. ?

## 2021-07-15 NOTE — Assessment & Plan Note (Signed)
Chronic, followed by cardiology, Dr. Nadyne Coombes ?He is currently in sinus rhythm and is not on blood thinner ?

## 2021-07-15 NOTE — Progress Notes (Signed)
? ? Patient ID: [name], male    DOB: 1946-01-31, 76 y.o.   MRN: [id number] ? ?This visit was conducted in person. ? ?BP 126/72   Pulse 86   Temp 97.7 ?F (36.5 ?C) (Oral)   Ht '6\' 3"'$  (1.905 m)   Wt 218 lb 4 oz (99 kg)   SpO2 94%   BMI 27.28 kg/m?   ? ?CC:  ?Chief Complaint  ?Patient presents with  ? Extremity Weakness  ?  X 4 to 6 months but was in [place] for 3 of those months  ? Leg Swelling  ?   ?  ? Fatigue  ?   ?  ? Foot Swelling  ? ? ?Subjective:  ? ?HPI: ?[name] is a 76 y.o. male presenting on 07/15/2021 for Extremity Weakness (X 4 to 6 months but was in [place] for 3 of those months), Leg Swelling (/), Fatigue (/), and Foot Swelling ?PCP Dr. Damita Dunnings ? History of   paroxsysmal atrial fibrillation with RVR (He is not on a blood thinner), parkinson's and dementia, HTN, hypothyrodism but not on med. ? ?He reports  new onset swelling in bith leg in last 4 months. ? He was supposed to get back injection for pain but MD got COVID and this was cancelled. ?  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 He reports he sat a lot in [place] ?Knee pain.Marland Kitchen got steroid injection by Dr. Mardelle Matte today. He has also bee referred to Dr. Estanislado Pandy for  consideration of RA.  ? ?Legs seem to be more week in last 4 months as well. ? ? He feels more tired, no SOB, no chest pain. ?No leg pain, no calf pain. ? ?Called Cardiology Dr. Einar Gip from [facility].. sent in lasix 20 mg daily.. did not help...  increased to '40mg'$  daily. Has not helped much. Swelling does go down with elevating legs but then comes ight back. ? ? ?Minimal salt and no ETOH.  ?BP Readings from Last 3 Encounters:  ?07/15/21 126/72  ?04/07/21 110/60  ?03/28/21 117/78  ? ? ?Wt Readings from Last 3 Encounters:  ?07/15/21 218 lb 4 oz (99 kg)  ?04/07/21 232 lb 2 oz (105.3 kg)  ?03/28/21 229 lb 6.4 oz (104.1 kg)  ? ? ?   ? ?Relevant past medical, surgical, family and social history reviewed and updated as indicated. Interim medical history since  our last visit reviewed. ?Allergies and medications reviewed and updated. ?Outpatient Medications Prior to Visit  ?Medication Sig Dispense Refill  ? cetirizine (ZYRTEC) 10 MG tablet Take 10 mg by mouth daily.    ? clotrimazole (LOTRIMIN) 1 % cream Apply 1 application topically 2 (two) times daily. 30 g 0  ? diazepam (VALIUM) 5 MG tablet Take 5 mg by mouth daily as needed for anxiety.    ? diclofenac Sodium (VOLTAREN) 1 % GEL Apply topically 4 (four) times daily.    ? diltiazem (CARDIZEM CD) 180 MG 24 hr capsule TAKE 1 CAPSULE(240 MG) BY MOUTH DAILY 90 capsule 0  ? fluticasone (FLONASE) 50 MCG/ACT nasal spray Place 2 sprays into both nostrils daily.    ? Fluticasone Propionate, Inhal, (FLOVENT DISKUS) 100 MCG/ACT AEPB 1 puff inhaled twice daily , rinse mouth after each use 1 each 2  ? furosemide (LASIX) 20 MG tablet Take 1 tablet (20 mg total) by mouth 2 (two) times daily. For 5 days 30 tablet 3  ? Menthol, Topical Analgesic, (BIOFREEZE EX) Apply  1 application topically daily as needed (pain).    ? mirtazapine (REMERON) 15 MG tablet Take 1 tablet (15 mg total) by mouth at bedtime. 90 tablet 0  ? omeprazole (PRILOSEC) 20 MG capsule Take 20 mg by mouth daily as needed (heart burn).    ? Oxycodone HCl 10 MG TABS     ? polyethylene glycol powder (GLYCOLAX/MIRALAX) 17 GM/SCOOP powder See admin instructions.    ? potassium chloride (KLOR-CON) 10 MEQ tablet Take 1 tablet (10 mEq total) by mouth 2 (two) times daily. 30 tablet 3  ? pramipexole (MIRAPEX) 0.5 MG tablet TAKE 1 TABLET(0.5 MG) BY MOUTH THREE TIMES DAILY 270 tablet 0  ? propranolol (INDERAL) 20 MG tablet Take 20 mg by mouth 2 (two) times daily.    ? traMADol (ULTRAM) 50 MG tablet TAKE 1 TABLET (50 MG TOTAL) BY MOUTH EVERY 12 (TWELVE) HOURS AS NEEDED. 40 tablet 0  ? triamcinolone cream (KENALOG) 0.5 % Apply 1 application topically 2 (two) times daily. Use on leg rash. 30 g 0  ? zinc gluconate 50 MG tablet as needed.    ? zolpidem (AMBIEN) 5 MG tablet Take 1 tablet  (5 mg total) by mouth at bedtime as needed for sleep. 30 tablet 1  ? ?No facility-administered medications prior to visit.  ?  ? ?Per HPI unless specifically indicated in ROS section below ?Review of Systems  ?Constitutional:  Negative for fatigue and fever.  ?HENT:  Negative for ear pain.   ?Eyes:  Negative for pain.  ?Respiratory:  Negative for cough and shortness of breath.   ?Cardiovascular:  Negative for chest pain, palpitations and leg swelling.  ?Gastrointestinal:  Negative for abdominal pain.  ?Genitourinary:  Negative for dysuria.  ?Musculoskeletal:  Negative for arthralgias.  ?Neurological:  Negative for syncope, light-headedness and headaches.  ?Psychiatric/Behavioral:  Negative for dysphoric mood.   ?Objective:  ?BP 126/72   Pulse 86   Temp 97.7 ?F (36.5 ?C) (Oral)   Ht '6\' 3"'$  (1.905 m)   Wt 218 lb 4 oz (99 kg)   SpO2 94%   BMI 27.28 kg/m?   ?Wt Readings from Last 3 Encounters:  ?07/15/21 218 lb 4 oz (99 kg)  ?04/07/21 232 lb 2 oz (105.3 kg)  ?03/28/21 229 lb 6.4 oz (104.1 kg)  ?  ?  ?Physical Exam ?Constitutional:   ?   Appearance: He is well-developed.  ?HENT:  ?   Head: Normocephalic.  ?   Right Ear: Hearing normal.  ?   Left Ear: Hearing normal.  ?   Nose: Nose normal.  ?Neck:  ?   Thyroid: No thyroid mass or thyromegaly.  ?   Vascular: No carotid bruit.  ?   Trachea: Trachea normal.  ?Cardiovascular:  ?   Rate and Rhythm: Normal rate and regular rhythm.  ?   Pulses: Normal pulses.  ?   Heart sounds: Heart sounds not distant. No murmur heard. ?  No friction rub. No gallop.  ?   Comments: Bilateral varicose veins ?Pulmonary:  ?   Effort: Pulmonary effort is normal. No respiratory distress.  ?   Breath sounds: Normal breath sounds.  ?Musculoskeletal:  ?   Right lower leg: Edema present.  ?   Left lower leg: Edema present.  ?Skin: ?   General: Skin is warm and dry.  ?   Findings: No rash.  ?Psychiatric:     ?   Speech: Speech normal.     ?   Behavior: Behavior normal.     ?  Thought Content:  Thought content normal.  ? ?   ?Results for orders placed or performed in visit on 04/07/21  ?CBC with Differential/Platelet  ?Result Value Ref Range  ? WBC 6.2 4.0 - 10.5 K/uL  ? RBC 4.49 4.22 - 5.81 Mil/uL  ? Hemoglobin 13.7 13.0 - 17.0 g/dL  ? HCT 40.8 39.0 - 52.0 %  ? MCV 91.0 78.0 - 100.0 fl  ? MCHC 33.4 30.0 - 36.0 g/dL  ? RDW 13.1 11.5 - 15.5 %  ? Platelets 203.0 150.0 - 400.0 K/uL  ? Neutrophils Relative % 57.6 43.0 - 77.0 %  ? Lymphocytes Relative 26.4 12.0 - 46.0 %  ? Monocytes Relative 9.2 3.0 - 12.0 %  ? Eosinophils Relative 5.6 (H) 0.0 - 5.0 %  ? Basophils Relative 1.2 0.0 - 3.0 %  ? Neutro Abs 3.6 1.4 - 7.7 K/uL  ? Lymphs Abs 1.6 0.7 - 4.0 K/uL  ? Monocytes Absolute 0.6 0.1 - 1.0 K/uL  ? Eosinophils Absolute 0.3 0.0 - 0.7 K/uL  ? Basophils Absolute 0.1 0.0 - 0.1 K/uL  ?Comprehensive metabolic panel  ?Result Value Ref Range  ? Sodium 136 135 - 145 mEq/L  ? Potassium 4.3 3.5 - 5.1 mEq/L  ? Chloride 102 96 - 112 mEq/L  ? CO2 26 19 - 32 mEq/L  ? Glucose, Bld 103 (H) 70 - 99 mg/dL  ? BUN 10 6 - 23 mg/dL  ? Creatinine, Ser 1.25 0.40 - 1.50 mg/dL  ? Total Bilirubin 0.7 0.2 - 1.2 mg/dL  ? Alkaline Phosphatase 61 39 - 117 U/L  ? AST 21 0 - 37 U/L  ? ALT 30 0 - 53 U/L  ? Total Protein 6.2 6.0 - 8.3 g/dL  ? Albumin 3.9 3.5 - 5.2 g/dL  ? GFR 56.60 (L) >60.00 mL/min  ? Calcium 8.9 8.4 - 10.5 mg/dL  ?TSH  ?Result Value Ref Range  ? TSH 4.34 0.35 - 5.50 uIU/mL  ?Brain natriuretic peptide  ?Result Value Ref Range  ? Pro B Natriuretic peptide (BNP) 34.0 0.0 - 100.0 pg/mL  ? ? ?This visit occurred during the SARS-CoV-2 public health emergency.  Safety protocols were in place, including screening questions prior to the visit, additional usage of staff PPE, and extensive cleaning of exam room while observing appropriate contact time as indicated for disinfecting solutions.  ? ?COVID 19 screen:  No recent travel or known exposure to [place] ?The patient denies respiratory symptoms of COVID 19 at this time. ?The importance  of social distancing was discussed today.  ? ?Assessment and Plan ?Problem List Items Addressed This Visit   ? ? Hypothyroidism  ?  Questionable diagnosis ?He is not on levothyroxine and appears that  he is not on any

## 2021-07-15 NOTE — Patient Instructions (Addendum)
Please stop at the lab to have labs drawn. ? Elevate legs above heart, wear compression hose as able. ? Walk as much as tolerated. ?

## 2021-07-15 NOTE — Assessment & Plan Note (Addendum)
Questionable diagnosis ?He is not on levothyroxine and appears that  he is not on any medication and has never been. ?We will reevaluate with TSH, free T3 and free T4 ?

## 2021-07-19 DIAGNOSIS — M5412 Radiculopathy, cervical region: Secondary | ICD-10-CM | POA: Diagnosis not present

## 2021-07-29 ENCOUNTER — Encounter: Payer: Self-pay | Admitting: Student

## 2021-07-29 ENCOUNTER — Ambulatory Visit: Payer: HMO | Admitting: Student

## 2021-07-29 VITALS — BP 137/80 | HR 68 | Temp 97.8°F | Resp 16 | Ht 75.0 in | Wt 217.0 lb

## 2021-07-29 DIAGNOSIS — I480 Paroxysmal atrial fibrillation: Secondary | ICD-10-CM

## 2021-07-29 DIAGNOSIS — R600 Localized edema: Secondary | ICD-10-CM

## 2021-07-29 DIAGNOSIS — I4510 Unspecified right bundle-branch block: Secondary | ICD-10-CM

## 2021-07-29 DIAGNOSIS — I10 Essential (primary) hypertension: Secondary | ICD-10-CM

## 2021-07-29 NOTE — Progress Notes (Signed)
? ?Primary Physician/Referring:  Tonia Ghent, MD ? ?Patient ID: [name], male    DOB: Jan 31, 1946, 76 y.o.   MRN: [id number] ? ?Chief Complaint  ?Patient presents with  ? Edema  ? Follow-up  ? ?HPI:   ? ?[name]  is a 76 y.o. 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 fib with RVR. Echocardiogram revealed normal LVEF.  No wall motion abnormalities.   He was discharged from ED on flecainide and propanolol as well as Xarelto for anticoagulation, he converted to sinus rhythm upon discharge. Patient had previously had A fib in Dec 2020 after back surgery and spontaneously converted to sinus.  ? ?Anticoagulation was discontinued by shared decision with patient and Dr. Einar Gip on 09/10/2019 as he had had no recurrence of atrial fibrillation.  Flecainide was also discontinued as patient did not tolerate this due to severe fatigue. ? ?Patient presents for follow-up.  He had called our office last month while he was in [place] with concerns of leg swelling and was advised to take Lasix temporarily.  He reports Lasix did not improve leg swelling, however when he returned to [facility] he received injections in both of his knees and following the steroid injection swelling and pain completely resolved.  Patient is now feeling well overall without specific complaints today. ? ?Denies chest pain, dyspnea, leg edema, orthopnea.  He has not taken Lasix in the last 1 month.  He has had no known recurrence of atrial fibrillation. ? ?Past Medical History:  ?Diagnosis Date  ? A-fib ([place])   ? Arthritis   ? GERD (gastroesophageal reflux disease)   ? H/O urinary frequency   ? Heart murmur   ? as a child  ? History of bronchitis   ? 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 Hypertension   ? improved after back pain treated with surgery  ? Insomnia   ? Insomnia   ? Improved with Ambien, did not tolerate melatonin.  ?  Joint pain   ? Parkinson disease ([place])   ? Sleep apnea   ? no CPAP  ? Vertigo 10/13/2014  ? recurrent; trigger is cool air on left ear, improved with daily antihistamine  ? ?Past Surgical History:  ?Procedure Laterality Date  ? ACHILLES TENDON REPAIR Left   ? ALLOGRAFT APPLICATION Left 6/75/9163  ? Procedure: APPLICATION OF  INTEGRA;  Surgeon: Irene Limbo, MD;  Location: [place];  Service: Plastics;  Laterality: Left;  ? BACK SURGERY    ? COLONOSCOPY  02/2011  ? Negative,[facility]  ? DEBRIDEMENT AND CLOSURE WOUND Left 02/25/2018  ? Procedure: DEBRIDEMENT LEFT LEG APPLICATION INTEGRA;  Surgeon: Irene Limbo, MD;  Location: [place];  Service: Plastics;  Laterality: Left;  ? EYE SURGERY Bilateral   ? cataracts  ? INCISION AND DRAINAGE OF WOUND Left 11/12/2020  ? Procedure: DEBRIDEMENT LLE ULCER;  Surgeon: Irene Limbo, MD;  Location: [place];  Service: Plastics;  Laterality: Left;  ? KNEE ARTHROSCOPY    ? L  ? LUMBAR FUSION    ? 2016  ? LUMBAR SPINE SURGERY  03/31/2019  ? exploration of lumbar fusion w/L5-S1 decompression redo L4-5 fusion  ? [place] SURGERY  2003  ? cervical fusion  ? TONSILLECTOMY    ? varicoelectomy    ? ? ?Family History  ?Problem Relation Age of Onset  ? Hypertension Mother   ?  Asthma Mother   ? Diabetes Father   ? Stroke Father   ? Heart attack Paternal Grandfather   ?     >55  ? Cancer Sister   ? Colon cancer Neg Hx   ? Prostate cancer Neg Hx   ? ? ?Social History  ? ?Tobacco Use  ? Smoking status: Never  ? Smokeless tobacco: Never  ?Substance Use Topics  ? Alcohol use: No  ? ?Marital Status: Married ?ROS  ?Review of Systems  ?Cardiovascular:  Negative for chest pain, claudication, dyspnea on exertion, leg swelling (resolved), near-syncope, orthopnea, palpitations and syncope.  ?Musculoskeletal:  Positive for arthritis and back pain.  ?Gastrointestinal:  Negative for hematochezia and melena.  ?Neurological:  Positive for  tremors. Negative for headaches and light-headedness.  ?Objective  ?Blood pressure 137/80, pulse 68, temperature 97.8 ?F (36.6 ?C), resp. rate 16, height '6\' 3"'$  (1.905 m), weight 217 lb (98.4 kg), SpO2 95 %.  ? ?  07/29/2021  ?  1:33 PM 07/15/2021  ? 11:37 AM 04/07/2021  ? 11:29 AM  ?Vitals with BMI  ?Height '6\' 3"'$  '6\' 3"'$  '6\' 3"'$   ?Weight 217 lbs 218 lbs 4 oz 232 lbs 2 oz  ?BMI 27.12 27.28 29.01  ?Systolic 952 841 324  ?Diastolic 80 72 60  ?Pulse 68 86 75  ? Orthostatic VS for the past 72 hrs (Last 3 readings): ? Patient Position BP Location Cuff Size  ?07/29/21 1333 Sitting Left Arm Normal  ? ? Physical Exam ?Vitals reviewed.  ?Neck:  ?   Thyroid: No thyromegaly.  ?Cardiovascular:  ?   Rate and Rhythm: Normal rate and regular rhythm.  ?   Pulses: Intact distal pulses.  ?   Heart sounds: Normal heart sounds, S1 normal and S2 normal. No murmur heard. ?  No gallop.  ?   Comments: No JVD. ?Pulmonary:  ?   Effort: Pulmonary effort is normal. No respiratory distress.  ?   Breath sounds: Normal breath sounds. No wheezing, rhonchi or rales.  ?Musculoskeletal:  ?   Cervical back: Neck supple.  ?   Right lower leg: No edema.  ?   Left lower leg: No edema.  ?Neurological:  ?   Mental Status: He is alert.  ? ?Laboratory examination:  ? ?Recent Labs  ?  12/30/20 ?1138 04/07/21 ?1230 07/15/21 ?1230  ?NA 138 136 138  ?K 4.4 4.3 4.7  ?CL 102 102 101  ?CO2 '28 26 30  '$ ?GLUCOSE 85 103* 91  ?BUN '16 10 13  '$ ?CREATININE 1.28 1.25 1.43  ?CALCIUM 9.6 8.9 9.4  ? ?estimated creatinine clearance is 53.3 mL/min (by C-G formula based on SCr of 1.43 mg/dL).  ? ?  Latest Ref Rng & Units 07/15/2021  ? 12:30 PM 04/07/2021  ? 12:30 PM 12/30/2020  ? 11:38 AM  ?CMP  ?Glucose 70 - 99 mg/dL 91   103   85    ?BUN 6 - 23 mg/dL '13   10   16    '$ ?Creatinine 0.40 - 1.50 mg/dL 1.43   1.25   1.28    ?Sodium 135 - 145 mEq/L 138   136   138    ?Potassium 3.5 - 5.1 mEq/L 4.7   4.3   4.4    ?Chloride 96 - 112 mEq/L 101   102   102    ?CO2 19 - 32 mEq/L '30   26   28     '$ ?Calcium 8.4 - 10.5 mg/dL 9.4   8.9  9.6    ?Total Protein 6.0 - 8.3 g/dL 6.6   6.2   6.7    ?Total Bilirubin 0.2 - 1.2 mg/dL 0.8   0.7   0.6    ?Alkaline Phos 39 - 117 U/L 74   61   72    ?AST 0 - 37 U/L '15   21   16    '$ ?ALT 0 - 53 U/L '13   30   18    '$ ? ? ?  Latest Ref Rng & Units 07/15/2021  ? 12:30 PM 04/07/2021  ? 12:30 PM 03/07/2021  ? 10:03 AM  ?CBC  ?WBC 4.0 - 10.5 K/uL 7.3   6.2   6.4    ?Hemoglobin 13.0 - 17.0 g/dL 14.2   13.7   13.7    ?Hematocrit 39.0 - 52.0 % 42.1   40.8   40.7    ?Platelets 150.0 - 400.0 K/uL 226.0   203.0   235.0    ? ?Lipid Panel  ?   ?Component Value Date/Time  ? CHOL 151 03/23/2020 0924  ? TRIG 117.0 03/23/2020 0924  ? HDL 32.80 (L) 03/23/2020 0924  ? CHOLHDL 5 03/23/2020 0924  ? VLDL 23.4 03/23/2020 0924  ? [place] 95 03/23/2020 0924  ? ?HEMOGLOBIN A1C ?No results found for: HGBA1C, MPG ?TSH ?Recent Labs  ?  12/30/20 ?1138 04/07/21 ?1230 07/15/21 ?1230  ?TSH 3.93 4.34 3.50  ? ?Allergies  ? ?Allergies  ?Allergen Reactions  ? Flecainide   ?  Vomit   ? Flexeril [Cyclobenzaprine]   ?  Constipation  ? Hctz [Hydrochlorothiazide]   ?  hyponatremia  ? Morphine And Related Hives  ?  Hallucinations- can tolerate oxycodone.   ? Promethazine   ?  Would avoid due to Parkinsons  ? Sinemet [Carbidopa-Levodopa]   ?  intolerant  ?  ?Medications Prior to Visit:  ? ?Outpatient Medications Prior to Visit  ?Medication Sig Dispense Refill  ? cetirizine (ZYRTEC) 10 MG tablet Take 10 mg by mouth daily.    ? clotrimazole (LOTRIMIN) 1 % cream Apply 1 application topically 2 (two) times daily. 30 g 0  ? diclofenac Sodium (VOLTAREN) 1 % GEL Apply topically 4 (four) times daily.    ? diltiazem (CARDIZEM CD) 180 MG 24 hr capsule TAKE 1 CAPSULE(240 MG) BY MOUTH DAILY 90 capsule 0  ? fluticasone (FLONASE) 50 MCG/ACT nasal spray Place 2 sprays into both nostrils daily.    ? omeprazole (PRILOSEC) 20 MG capsule Take 20 mg by mouth daily as needed (heart burn).    ? Oxycodone HCl 10 MG TABS     ? potassium chloride  (KLOR-CON) 10 MEQ tablet Take 1 tablet (10 mEq total) by mouth 2 (two) times daily. 30 tablet 3  ? pramipexole (MIRAPEX) 0.5 MG tablet TAKE 1 TABLET(0.5 MG) BY MOUTH THREE TIMES DAILY 270 tablet 0  ? propranolol

## 2021-08-01 ENCOUNTER — Other Ambulatory Visit (INDEPENDENT_AMBULATORY_CARE_PROVIDER_SITE_OTHER): Payer: PPO

## 2021-08-01 ENCOUNTER — Other Ambulatory Visit: Payer: Self-pay | Admitting: Family Medicine

## 2021-08-01 DIAGNOSIS — I10 Essential (primary) hypertension: Secondary | ICD-10-CM | POA: Diagnosis not present

## 2021-08-01 LAB — LIPID PANEL
Cholesterol: 167 mg/dL (ref 0–200)
HDL: 44.7 mg/dL (ref >39.00)
LDL Cholesterol: 108 mg/dL — ABNORMAL HIGH (ref 0–99)
NonHDL: 122.32
Total CHOL/HDL Ratio: 4
Triglycerides: 72 mg/dL (ref 0.0–149.0)
VLDL: 14.4 mg/dL (ref 0.0–40.0)

## 2021-08-02 ENCOUNTER — Other Ambulatory Visit: Payer: Self-pay | Admitting: Family Medicine

## 2021-08-02 NOTE — Telephone Encounter (Signed)
Refill request for Oxycodone HCl 10 MG TABS ? ?LOV - 07/15/21 ?Next OV - not scheduled ?Last refill - not sure as rx was entered as historical provider ? ?

## 2021-08-03 MED ORDER — OXYCODONE HCL 10 MG PO TABS: 5.0000 mg | ORAL_TABLET | Freq: Two times a day (BID) | ORAL | 0 refills | Status: AC | PRN

## 2021-08-03 NOTE — Telephone Encounter (Signed)
Called and notified patient rx was sent. Advised not to take with Tramadol and if that does not help or is in too much pain to call back for appt. Patient verbalized understanding.  ?

## 2021-08-03 NOTE — Telephone Encounter (Signed)
Please check with patient about this rx, as it hasn't been filled recently.  Please let me know about his situation.  Thanks.  ?

## 2021-08-03 NOTE — Telephone Encounter (Signed)
I sent a short term refill with sedation caution.  Do not take with tramadol.  If he is in this much pain then he needs evaluation.  Please schedule or have him get seen at the urgent care/outside clinic.  Thanks. ?

## 2021-08-03 NOTE — Telephone Encounter (Signed)
Patient stated his lower back is in so much pain; and that hes having trouble just functioning in my garage and shop ?

## 2021-08-05 MED ORDER — ATORVASTATIN CALCIUM 20 MG PO TABS: 20.0000 mg | ORAL_TABLET | Freq: Every day | ORAL | 3 refills | Status: DC

## 2021-08-05 NOTE — Progress Notes (Signed)
Called pt to inform him about his lab results. Pt agrees on starting statin

## 2021-08-05 NOTE — Progress Notes (Signed)
Script has been sent.

## 2021-08-05 NOTE — Addendum Note (Signed)
Addended by: Lawerance Cruel L on: 08/05/2021 10:01 AM ? ? Modules accepted: Orders ? ?

## 2021-08-11 ENCOUNTER — Encounter: Payer: Self-pay | Admitting: Cardiology

## 2021-08-12 ENCOUNTER — Other Ambulatory Visit: Payer: Self-pay

## 2021-08-12 DIAGNOSIS — I10 Essential (primary) hypertension: Secondary | ICD-10-CM

## 2021-08-12 MED ORDER — DILTIAZEM HCL ER COATED BEADS 180 MG PO CP24: ORAL_CAPSULE | ORAL | 0 refills | Status: DC

## 2021-08-13 ENCOUNTER — Other Ambulatory Visit: Payer: Self-pay | Admitting: Student

## 2021-08-13 DIAGNOSIS — R600 Localized edema: Secondary | ICD-10-CM

## 2021-08-15 ENCOUNTER — Telehealth: Payer: Self-pay | Admitting: Family Medicine

## 2021-08-15 NOTE — Telephone Encounter (Signed)
LVM for pt to rtn my call to schedule AWV with [facility]. Please schedule AWV with [facility] if pt calls the office.  ?

## 2021-08-19 DIAGNOSIS — M170 Bilateral primary osteoarthritis of knee: Secondary | ICD-10-CM | POA: Diagnosis not present

## 2021-08-26 ENCOUNTER — Other Ambulatory Visit: Payer: Self-pay | Admitting: Student

## 2021-08-26 DIAGNOSIS — M170 Bilateral primary osteoarthritis of knee: Secondary | ICD-10-CM | POA: Diagnosis not present

## 2021-08-26 DIAGNOSIS — R600 Localized edema: Secondary | ICD-10-CM

## 2021-08-29 DIAGNOSIS — M5412 Radiculopathy, cervical region: Secondary | ICD-10-CM | POA: Diagnosis not present

## 2021-09-02 DIAGNOSIS — M170 Bilateral primary osteoarthritis of knee: Secondary | ICD-10-CM | POA: Diagnosis not present

## 2021-09-06 ENCOUNTER — Ambulatory Visit (INDEPENDENT_AMBULATORY_CARE_PROVIDER_SITE_OTHER): Payer: PPO

## 2021-09-06 VITALS — Ht 75.0 in | Wt 215.0 lb

## 2021-09-06 DIAGNOSIS — Z0000 Encounter for general adult medical examination without abnormal findings: Secondary | ICD-10-CM

## 2021-09-06 NOTE — Progress Notes (Signed)
Subjective:   [name] is a 76 y.o. male who presents for Medicare Annual/Subsequent preventive examination.  Review of Systems    Virtual Visit via Telephone Note  I connected with  [name] on 09/06/21 at  9:15 AM EDT by telephone and verified that I am speaking with the correct person using two identifiers.  Location: Patient: Home Provider: Office Persons participating in the virtual visit: patient/Nurse Health Advisor   I discussed the limitations, risks, security and privacy concerns of performing an evaluation and management service by telephone and the availability of in person appointments. The patient expressed understanding and agreed to proceed.  Interactive audio and video telecommunications were attempted between this nurse and patient, however failed, due to patient having technical difficulties OR patient did not have access to video capability.  We continued and completed visit with audio only.  Some vital signs may be absent or patient reported.   Criselda Peaches, LPN  Cardiac Risk Factors include: advanced age (>46mn, >>24women);hypertension;male gender     Objective:    Today's Vitals   09/06/21 0917  Weight: 215 lb (97.5 kg)  Height: '6\' 3"'$  (1.905 m)   Body mass index is 26.87 kg/m.     09/06/2021    9:27 AM 03/28/2021    8:19 AM 11/12/2020   11:37 AM 11/09/2020    2:30 PM 09/22/2020    9:01 AM 04/06/2020   10:31 AM 03/26/2020    2:54 PM  Advanced Directives  Does Patient Have a Medical Advance Directive? Yes Yes Yes Yes Yes Yes Yes  Type of AParamedicof AFrankfortLiving will Living will HSan PerlitaLiving will HJerico SpringsLiving will HClintonLiving will;Out of facility DNR (pink MOST or yellow form) HAthensLiving will HStonewallLiving will  Does patient want to make changes to medical advance directive? No -  Patient declined  No - Patient declined No - Patient declined     Copy of HFairfieldin Chart? No - copy requested  No - copy requested    No - copy requested    Current Medications (verified) Outpatient Encounter Medications as of 09/06/2021  Medication Sig   atorvastatin (LIPITOR) 20 MG tablet Take 1 tablet (20 mg total) by mouth daily.   cetirizine (ZYRTEC) 10 MG tablet Take 10 mg by mouth daily.   clotrimazole (LOTRIMIN) 1 % cream Apply 1 application topically 2 (two) times daily.   diclofenac Sodium (VOLTAREN) 1 % GEL Apply topically 4 (four) times daily.   diltiazem (CARDIZEM CD) 180 MG 24 hr capsule TAKE 1 CAPSULE(240 MG) BY MOUTH DAILY   fluticasone (FLONASE) 50 MCG/ACT nasal spray Place 2 sprays into both nostrils daily.   omeprazole (PRILOSEC) 20 MG capsule Take 20 mg by mouth daily as needed (heart burn).   potassium chloride (KLOR-CON) 10 MEQ tablet TAKE 1 TABLET BY MOUTH TWICE A DAY   pramipexole (MIRAPEX) 0.5 MG tablet TAKE 1 TABLET(0.5 MG) BY MOUTH THREE TIMES DAILY   propranolol (INDERAL) 20 MG tablet Take 20 mg by mouth 2 (two) times daily.   traMADol (ULTRAM) 50 MG tablet TAKE 1 TABLET (50 MG TOTAL) BY MOUTH EVERY 12 (TWELVE) HOURS AS NEEDED.   triamcinolone cream (KENALOG) 0.5 % Apply 1 application topically 2 (two) times daily. Use on leg rash.   No facility-administered encounter medications on file as of 09/06/2021.    Allergies (verified) Flecainide, Flexeril [cyclobenzaprine],  Hctz [hydrochlorothiazide], Morphine and related, Promethazine, and Sinemet [carbidopa-levodopa]   History: Past Medical History:  Diagnosis Date   A-fib ([place])    Arthritis    GERD (gastroesophageal reflux disease)    H/O urinary frequency    Heart murmur    as a child   History of bronchitis    HSV infection    oral   Hypertension    improved after back pain treated with surgery   Insomnia    Insomnia    Improved with Ambien, did not tolerate melatonin.   Joint  pain    Parkinson disease ([place])    Sleep apnea    no CPAP   Vertigo 10/13/2014   recurrent; trigger is cool air on left ear, improved with daily antihistamine   Past Surgical History:  Procedure Laterality Date   ACHILLES TENDON REPAIR Left    ALLOGRAFT APPLICATION Left 1/96/2229   Procedure: APPLICATION OF  INTEGRA;  Surgeon: Irene Limbo, MD;  Location: [place];  Service: Plastics;  Laterality: Left;   BACK SURGERY     COLONOSCOPY  02/2011   Negative,[facility]   DEBRIDEMENT AND CLOSURE WOUND Left 02/25/2018   Procedure: DEBRIDEMENT LEFT LEG APPLICATION INTEGRA;  Surgeon: Irene Limbo, MD;  Location: [place];  Service: Plastics;  Laterality: Left;   EYE SURGERY Bilateral    cataracts   INCISION AND DRAINAGE OF WOUND Left 11/12/2020   Procedure: DEBRIDEMENT LLE ULCER;  Surgeon: Irene Limbo, MD;  Location: [place];  Service: Plastics;  Laterality: Left;   KNEE ARTHROSCOPY     L   LUMBAR FUSION     2016   LUMBAR SPINE SURGERY  03/31/2019   exploration of lumbar fusion w/L5-S1 decompression redo L4-5 fusion   SPINE SURGERY  2003   cervical fusion   TONSILLECTOMY     varicoelectomy     Family History  Problem Relation Age of Onset   Hypertension Mother    Asthma Mother    Diabetes Father    Stroke Father    Heart attack Paternal Grandfather        >55   Cancer Sister    Colon cancer Neg Hx    Prostate cancer Neg Hx    Social History   Socioeconomic History   Marital status: Married    Spouse name: Not on file   Number of children: 2   Years of education: 14   Highest education level: Not on file  Occupational History   Occupation: retired  Tobacco Use   Smoking status: Never   Smokeless tobacco: Never  Vaping Use   Vaping Use: Never used  Substance and Sexual Activity   Alcohol use: No   Drug use: No   Sexual activity: Not on file  Other Topics Concern   Not on file  Social History  Narrative   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     Social Determinants of Health   Financial Resource Strain: Low Risk    Difficulty of Paying Living Expenses: Not hard at all  Food Insecurity: No Food Insecurity   Worried About Charity fundraiser in the Last Year: Never true   Ran Out of Food in the Last Year: Never true  Transportation Needs: No Data processing manager (Medical): No   Lack of Transportation (Non-Medical): No  Physical Activity: Inactive   Days of Exercise per Week: 0 days   Minutes of Exercise per Session: 0 min  Stress: No Stress Concern Present   Feeling of Stress : Not at all  Social Connections: Socially Integrated   Frequency of Communication with Friends and Family: More than three times a week   Frequency of Social Gatherings with Friends and Family: More than three times a week   Attends Religious Services: More than 4 times per year   Active Member of [employer] or Organizations: Yes   Attends Music therapist: More than 4 times per year   Marital Status: Married     Clinical Intake:  Pre-visit preparation completed: NoDiabetic?  No  Interpreter Needed?: NoActivities of Daily Living    09/06/2021    9:24 AM 11/12/2020   11:47 AM  In your present state of health, do you have any difficulty performing the following activities:  Hearing? 0 0  Vision? 0 0  Difficulty concentrating or making decisions? 0 0  Walking or climbing stairs? 0 0  Dressing or bathing? 0 0  Doing errands, shopping? 0   Preparing Food and eating ? N   Using the Toilet? N   In the past six months, have you accidently leaked urine? N   Do you have problems with loss of bowel control? N   Managing your Medications? N   Managing your Finances?  N   Housekeeping or managing your Housekeeping? N     Patient Care Team: Tonia Ghent, MD as PCP - General (Family Medicine) Tat, Eustace Quail, DO as Consulting Physician (Neurology) Newman Pies, MD as Consulting Physician (Neurosurgery)  Indicate any recent Medical Services you may have received from other than [facility] providers in the past year (date may be approximate).     Assessment:   This is a routine wellness examination for Future.  Hearing/Vision screen Hearing Screening - Comments:: No hearing difficulty Vision Screening - Comments:: Wears reading glasses. Follow[facility]  Dietary issues and exercise activities discussed: Exercise limited by: orthopedic condition(s) (Followed by Dr Mardelle Matte)   Goals Addressed               This Visit's Progress     Patient stated (pt-stated)        Maintain good health.       Depression Screen    09/06/2021    9:22 AM 03/30/2020   10:28 AM 03/26/2020    2:56 PM 03/09/2020    4:18 PM 04/11/2017    9:02 AM 11/30/2015   12:41 PM 01/01/2015   10:18 AM  PHQ 2/9 Scores  PHQ - 2 Score 0 0 0 0 0 0 0  PHQ- 9 Score  0 0  0      Fall Risk    09/06/2021    9:26 AM 03/28/2021    8:18 AM 09/22/2020    9:01 AM 04/06/2020   10:31 AM 03/30/2020   10:28 AM  Fall Risk   Falls in the past year? 0 0 0 1 0  Number falls in past yr: 0 0 0 0 0  Injury with Fall? 0 0 0 0 0  Risk for fall due to : No Fall Risks      Follow up     Falls evaluation completed    FALL RISK PREVENTION PERTAINING TO  THE HOME:  Any stairs in or around the home? Yes  If so, are there any without handrails? No  Home free of loose throw rugs in walkways, pet beds, electrical cords, etc? Yes  Adequate lighting in your home to reduce risk of falls? Yes   ASSISTIVE DEVICES UTILIZED TO PREVENT FALLS:  Life alert? No  Use of a cane, walker or w/c? No  Grab bars in the bathroom? Yes  Shower chair or bench in shower? Yes  Elevated toilet seat or  a handicapped toilet? Yes  TIMED UP AND GO:  Was the test performed? No . Audio Visit  Cognitive Function:    03/26/2020    2:59 PM 04/11/2017    9:02 AM 10/13/2014   10:20 AM  MMSE - Mini Mental State Exam  Not completed: Refused  Unable to complete  Orientation to time  5   Orientation to Place  5   Registration  3   Attention/ Calculation  0   Recall  3   Language- name 2 objects  0   Language- repeat  1   Language- follow 3 step command  3   Language- read & follow direction  0   Write a sentence  0   Copy design  0   Total score  20         09/06/2021    9:27 AM  6CIT Screen  What Year? 0 points  What month? 0 points  What time? 0 points  Count back from 20 0 points  Months in reverse 0 points  Repeat phrase 0 points  Total Score 0 points    Immunizations Immunization History  Administered Date(s) Administered   Pneumococcal Conjugate-13 05/01/2017    TDAP status: Up to date  Flu Vaccine status: Declined, Education has been provided regarding the importance of this vaccine but patient still declined. Advised may receive this vaccine at local pharmacy or Health Dept. Aware to provide a copy of the vaccination record if obtained from local pharmacy or Health Dept. Verbalized acceptance and understanding.  Pneumococcal vaccine status: Up to date  Covid-19 vaccine status: Declined, Education has been provided regarding the importance of this vaccine but patient still declined. Advised may receive this vaccine at local pharmacy or Health Dept.or vaccine clinic. Aware to provide a copy of the vaccination record if obtained from local pharmacy or Health Dept. Verbalized acceptance and understanding.  Qualifies for Shingles Vaccine? Yes   Zostavax completed No   Shingrix Completed?: No.    Education has been provided regarding the importance of this vaccine. Patient has been advised to call insurance company to determine out of pocket expense if they have not yet  received this vaccine. Advised may also receive vaccine at local pharmacy or Health Dept. Verbalized acceptance and understanding.  Screening Tests Health Maintenance  Topic Date Due   Zoster Vaccines- Shingrix (1 of 2) 12/07/2021 (Originally 06/12/1996)   Pneumonia Vaccine 48+ Years old (2 - PPSV23 if available, else PCV20) 09/07/2022 (Originally 05/01/2018)   TETANUS/TDAP  03/26/2024 (Originally [birth year]1965)   INFLUENZA VACCINE  11/15/2021   COLONOSCOPY (Pts 45-23yr Insurance coverage will need to be confirmed)  01/21/2030   Hepatitis C Screening  Completed   HPV VACCINES  Aged Out   COVID-19 Vaccine  Discontinued    Health Maintenance  There are no preventive care reminders to display for this patient.   Colorectal cancer screening: Type of screening: Colonoscopy. Completed 01/22/20. Repeat every 10 years  Lung Cancer  Screening: (Low Dose CT Chest recommended if Age 63-80 years, 30 pack-year currently smoking OR have quit w/in 15years.) does not qualify.    Additional Screening:  Hepatitis C Screening: does qualify; Completed 04/17/1998  Vision Screening: Recommended annual ophthalmology exams for early detection of glaucoma and other disorders of the eye. Is the patient up to date with their annual eye exam?  Yes  Who is the provider or what is the name of the office in which the patient attends annual eye exams? [facility] If pt is not established with a provider, would they like to be referred to a provider to establish care? No .   Dental Screening: Recommended annual dental exams for proper oral hygiene  Community Resource Referral / Chronic Care Management:  CRR required this visit?  No   CCM required this visit?  No      Plan:     I have personally reviewed and noted the following in the patient's chart:   Medical and social history Use of alcohol, tobacco or illicit drugs  Current medications and supplements including opioid prescriptions. Patient is  currently taking opioid prescriptions. Information provided to patient regarding non-opioid alternatives. Patient advised to discuss non-opioid treatment plan with their provider. Functional ability and status Nutritional status Physical activity Advanced directives List of other physicians Hospitalizations, surgeries, and ER visits in previous 12 months Vitals Screenings to include cognitive, depression, and falls Referrals and appointments  In addition, I have reviewed and discussed with patient certain preventive protocols, quality metrics, and best practice recommendations. A written personalized care plan for preventive services as well as general preventive health recommendations were provided to patient.     Criselda Peaches, LPN   3/81/8403   Nurse Notes: None

## 2021-09-06 NOTE — Patient Instructions (Addendum)
[name] , Thank you for taking time to come for your Medicare Wellness Visit. I appreciate your ongoing commitment to your health goals. Please review the following plan we discussed and let me know if I can assist you in the future.   These are the goals we discussed:  Goals       Increase physical activity      Starting 04/11/2017, I will continue to exercise for 30 min 3-5 days per week.        Patient Stated      03/26/2020, I will continue to ride bikes 3-4 days a week for about 2 hours.       Patient stated (pt-stated)      Maintain good health.        This is a list of the screening recommended for you and due dates:  Health Maintenance  Topic Date Due   Zoster (Shingles) Vaccine (1 of 2) 12/07/2021*   Pneumonia Vaccine (2 - PPSV23 if available, else PCV20) 09/07/2022*   Tetanus Vaccine  03/26/2024*   Flu Shot  11/15/2021   Colon Cancer Screening  01/21/2030   Hepatitis C Screening: USPSTF Recommendation to screen - Ages 18-79 yo.  Completed   HPV Vaccine  Aged Out   COVID-19 Vaccine  Discontinued  *Topic was postponed. The date shown is not the original due date.   Opioid Pain Medicine Management Opioids are powerful medicines that are used to treat moderate to severe pain. When used for short periods of time, they can help you to: Sleep better. Do better in physical or occupational therapy. Feel better in the first few days after an injury. Recover from surgery. Opioids should be taken with the supervision of a trained health care provider. They should be taken for the shortest period of time possible. This is because opioids can be addictive, and the longer you take opioids, the greater your risk of addiction. This addiction can also be called opioid use disorder. What are the risks? Using opioid pain medicines for longer than 3 days increases your risk of side effects. Side effects include: Constipation. Nausea and vomiting. Breathing difficulties  (respiratory depression). Drowsiness. Confusion. Opioid use disorder. Itching. Taking opioid pain medicine for a long period of time can affect your ability to do daily tasks. It also puts you at risk for: Motor vehicle crashes. Depression. Suicide. Heart attack. Overdose, which can be life-threatening. What is a pain treatment plan? A pain treatment plan is an agreement between you and your health care provider. Pain is unique to each person, and treatments vary depending on your condition. To manage your pain, you and your health care provider need to work together. To help you do this: Discuss the goals of your treatment, including how much pain you might expect to have and how you will manage the pain. Review the risks and benefits of taking opioid medicines. Remember that a good treatment plan uses more than one approach and minimizes the chance of side effects. Be honest about the amount of medicines you take and about any drug or alcohol use. Get pain medicine prescriptions from only one health care provider. Pain can be managed with many types of alternative treatments. Ask your health care provider to refer you to one or more specialists who can help you manage pain through: Physical or occupational therapy. Counseling (cognitive behavioral therapy). Good nutrition. Biofeedback. Massage. Meditation. Non-opioid medicine. Following a gentle exercise program. How to use opioid pain medicine Taking medicine Take  your pain medicine exactly as told by your health care provider. Take it only when you need it. If your pain gets less severe, you may take less than your prescribed dose if your health care provider approves. If you are not having pain, do nottake pain medicine unless your health care provider tells you to take it. If your pain is severe, do nottry to treat it yourself by taking more pills than instructed on your prescription. Contact your health care provider for  help. Write down the times when you take your pain medicine. It is easy to become confused while on pain medicine. Writing the time can help you avoid overdose. Take other over-the-counter or prescription medicines only as told by your health care provider. Keeping yourself and others safe  While you are taking opioid pain medicine: Do not drive, use machinery, or power tools. Do not sign legal documents. Do not drink alcohol. Do not take sleeping pills. Do not supervise children by yourself. Do not do activities that require climbing or being in high places. Do not go to a lake, river, ocean, spa, or swimming pool. Do not share your pain medicine with anyone. Keep pain medicine in a locked cabinet or in a secure area where pets and children cannot reach it. Stopping your use of opioids If you have been taking opioid medicine for more than a few weeks, you may need to slowly decrease (taper) how much you take until you stop completely. Tapering your use of opioids can decrease your risk of symptoms of withdrawal, such as: Pain and cramping in the abdomen. Nausea. Sweating. Sleepiness. Restlessness. Uncontrollable shaking (tremors). Cravings for the medicine. Do not attempt to taper your use of opioids on your own. Talk with your health care provider about how to do this. Your health care provider may prescribe a step-down schedule based on how much medicine you are taking and how long you have been taking it. Getting rid of leftover pills Do not save any leftover pills. Get rid of leftover pills safely by: Taking the medicine to a prescription take-back program. This is usually offered by the county or law enforcement. Bringing them to a pharmacy that has a drug disposal container. Flushing them down the toilet. Check the label or package insert of your medicine to see whether this is safe to do. Throwing them out in the trash. Check the label or package insert of your medicine to see  whether this is safe to do. If it is safe to throw it out, remove the medicine from the original container, put it into a sealable bag or container, and mix it with used coffee grounds, food scraps, dirt, or cat litter before putting it in the trash. Follow these instructions at home: Activity Do exercises as told by your health care provider. Avoid activities that make your pain worse. Return to your normal activities as told by your health care provider. Ask your health care provider what activities are safe for you. General instructions You may need to take these actions to prevent or treat constipation: Drink enough fluid to keep your urine pale yellow. Take over-the-counter or prescription medicines. Eat foods that are high in fiber, such as beans, whole grains, and fresh fruits and vegetables. Limit foods that are high in fat and processed sugars, such as fried or sweet foods. Keep all follow-up visits. This is important. Where to find support If you have been taking opioids for a long time, you may benefit from receiving  support for quitting from a local support group or counselor. Ask your health care provider for a referral to these resources in your area. Where to find more information Centers for Disease Control and Prevention (CDC): http://www.wolf.info/ [place] Food and Drug Administration (FDA): [place] Get help right away if: You may have taken too much of an opioid (overdosed). Common symptoms of an overdose: Your breathing is slower or more shallow than normal. You have a very slow heartbeat (pulse). You have slurred speech. You have nausea and vomiting. Your pupils become very small. You have other potential symptoms: You are very confused. You faint or feel like you will faint. You have cold, clammy skin. You have blue lips or fingernails. You have thoughts of harming yourself or harming others. These symptoms may represent a serious problem that is an emergency. Do not wait  to see if the symptoms will go away. Get medical help right away. Call your local emergency services (911 in the [place] Do not drive yourself to the hospital.  If you ever feel like you may hurt yourself or others, or have thoughts about taking your own life, get help right away. Go to your nearest emergency department or: Call your local emergency services (911 in the [place] Call the [facility] 681-390-1983 in the [place] Call a suicide crisis helpline, such as the [facility] at (207)368-6415 or 988 in the [place]. This is open 24 hours a day in the [place] Text the Crisis Text Line at 908-289-6309 (in the [place].). Summary Opioid medicines can help you manage moderate to severe pain for a short period of time. A pain treatment plan is an agreement between you and your health care provider. Discuss the goals of your treatment, including how much pain you might expect to have and how you will manage the pain. If you think that you or someone else may have taken too much of an opioid, get medical help right away. This information is not intended to replace advice given to you by your health care provider. Make sure you discuss any questions you have with your health care provider. Document Revised: 10/27/2020 Document Reviewed: 07/14/2020 Elsevier Patient Education  [place] directives: Yes Patient will submit copy  Conditions/risks identified: None  Next appointment: Follow up in one year for your annual wellness visit.    Preventive Care 55 Years and Older, Male Preventive care refers to lifestyle choices and visits with your health care provider that can promote health and wellness. What does preventive care include? A yearly physical exam. This is also called an annual well check. Dental exams once or twice a year. Routine eye exams. Ask your health care provider how often you should have your eyes checked. Personal lifestyle  choices, including: Daily care of your teeth and gums. Regular physical activity. Eating a healthy diet. Avoiding tobacco and drug use. Limiting alcohol use. Practicing safe sex. Taking low doses of aspirin every day. Taking vitamin and mineral supplements as recommended by your health care provider. What happens during an annual well check? The services and screenings done by your health care provider during your annual well check will depend on your age, overall health, lifestyle risk factors, and family history of disease. Counseling  Your health care provider may ask you questions about your: Alcohol use. Tobacco use. Drug use. Emotional well-being. Home and relationship well-being. Sexual activity. Eating habits. History of falls. Memory and ability to understand (cognition).  Work and work Statistician. Screening  You may have the following tests or measurements: Height, weight, and BMI. Blood pressure. Lipid and cholesterol levels. These may be checked every 5 years, or more frequently if you are over 82 years old. Skin check. Lung cancer screening. You may have this screening every year starting at age 25 if you have a 30-pack-year history of smoking and currently smoke or have quit within the past 15 years. Fecal occult blood test (FOBT) of the stool. You may have this test every year starting at age 63. Flexible sigmoidoscopy or colonoscopy. You may have a sigmoidoscopy every 5 years or a colonoscopy every 10 years starting at age 20. Prostate cancer screening. Recommendations will vary depending on your family history and other risks. Hepatitis C blood test. Hepatitis B blood test. Sexually transmitted disease (STD) testing. Diabetes screening. This is done by checking your blood sugar (glucose) after you have not eaten for a while (fasting). You may have this done every 1-3 years. Abdominal aortic aneurysm (AAA) screening. You may need this if you are a current or  former smoker. Osteoporosis. You may be screened starting at age 44 if you are at high risk. Talk with your health care provider about your test results, treatment options, and if necessary, the need for more tests. Vaccines  Your health care provider may recommend certain vaccines, such as: Influenza vaccine. This is recommended every year. Tetanus, diphtheria, and acellular pertussis (Tdap, Td) vaccine. You may need a Td booster every 10 years. Zoster vaccine. You may need this after age 89. Pneumococcal 13-valent conjugate (PCV13) vaccine. One dose is recommended after age 2. Pneumococcal polysaccharide (PPSV23) vaccine. One dose is recommended after age 82. Talk to your health care provider about which screenings and vaccines you need and how often you need them. This information is not intended to replace advice given to you by your health care provider. Make sure you discuss any questions you have with your health care provider. Document Released: 04/30/2015 Document Revised: 12/22/2015 Document Reviewed: 02/02/2015 Elsevier Interactive Patient Education  2017 [employer] Prevention in the Home Falls can cause injuries. They can happen to people of all ages. There are many things you can do to make your home safe and to help prevent falls. What can I do on the outside of my home? Regularly fix the edges of walkways and driveways and fix any cracks. Remove anything that might make you trip as you walk through a door, such as a raised step or threshold. Trim any bushes or trees on the path to your home. Use bright outdoor lighting. Clear any walking paths of anything that might make someone trip, such as rocks or tools. Regularly check to see if handrails are loose or broken. Make sure that both sides of any steps have handrails. Any raised decks and porches should have guardrails on the edges. Have any leaves, snow, or ice cleared regularly. Use sand or salt on walking paths  during winter. Clean up any spills in your garage right away. This includes oil or grease spills. What can I do in the bathroom? Use night lights. Install grab bars by the toilet and in the tub and shower. Do not use towel bars as grab bars. Use non-skid mats or decals in the tub or shower. If you need to sit down in the shower, use a plastic, non-slip stool. Keep the floor dry. Clean up any water that spills on the floor as soon as  it happens. Remove soap buildup in the tub or shower regularly. Attach bath mats securely with double-sided non-slip rug tape. Do not have throw rugs and other things on the floor that can make you trip. What can I do in the bedroom? Use night lights. Make sure that you have a light by your bed that is easy to reach. Do not use any sheets or blankets that are too big for your bed. They should not hang down onto the floor. Have a firm chair that has side arms. You can use this for support while you get dressed. Do not have throw rugs and other things on the floor that can make you trip. What can I do in the kitchen? Clean up any spills right away. Avoid walking on wet floors. Keep items that you use a lot in easy-to-reach places. If you need to reach something above you, use a strong step stool that has a grab bar. Keep electrical cords out of the way. Do not use floor polish or wax that makes floors slippery. If you must use wax, use non-skid floor wax. Do not have throw rugs and other things on the floor that can make you trip. What can I do with my stairs? Do not leave any items on the stairs. Make sure that there are handrails on both sides of the stairs and use them. Fix handrails that are broken or loose. Make sure that handrails are as long as the stairways. Check any carpeting to make sure that it is firmly attached to the stairs. Fix any carpet that is loose or worn. Avoid having throw rugs at the top or bottom of the stairs. If you do have throw  rugs, attach them to the floor with carpet tape. Make sure that you have a light switch at the top of the stairs and the bottom of the stairs. If you do not have them, ask someone to add them for you. What else can I do to help prevent falls? Wear shoes that: Do not have high heels. Have rubber bottoms. Are comfortable and fit you well. Are closed at the toe. Do not wear sandals. If you use a stepladder: Make sure that it is fully opened. Do not climb a closed stepladder. Make sure that both sides of the stepladder are locked into place. Ask someone to hold it for you, if possible. Clearly mark and make sure that you can see: Any grab bars or handrails. First and last steps. Where the edge of each step is. Use tools that help you move around (mobility aids) if they are needed. These include: Canes. Walkers. Scooters. Crutches. Turn on the lights when you go into a dark area. Replace any light bulbs as soon as they burn out. Set up your furniture so you have a clear path. Avoid moving your furniture around. If any of your floors are uneven, fix them. If there are any pets around you, be aware of where they are. Review your medicines with your doctor. Some medicines can make you feel dizzy. This can increase your chance of falling. Ask your doctor what other things that you can do to help prevent falls. This information is not intended to replace advice given to you by your health care provider. Make sure you discuss any questions you have with your health care provider. Document Released: 01/28/2009 Document Revised: 09/09/2015 Document Reviewed: 05/08/2014 Elsevier Interactive Patient Education  2017 [employer].

## 2021-09-07 ENCOUNTER — Other Ambulatory Visit: Payer: Self-pay | Admitting: Student

## 2021-09-07 DIAGNOSIS — R600 Localized edema: Secondary | ICD-10-CM

## 2021-09-09 DIAGNOSIS — M170 Bilateral primary osteoarthritis of knee: Secondary | ICD-10-CM | POA: Diagnosis not present

## 2021-09-12 ENCOUNTER — Encounter: Payer: Self-pay | Admitting: Emergency Medicine

## 2021-09-12 ENCOUNTER — Ambulatory Visit
Admission: EM | Admit: 2021-09-12 | Discharge: 2021-09-12 | Disposition: A | Discharge status: Home / Self Care | Payer: PPO | Attending: Emergency Medicine | Admitting: Emergency Medicine

## 2021-09-12 DIAGNOSIS — L03115 Cellulitis of right lower limb: Secondary | ICD-10-CM

## 2021-09-12 DIAGNOSIS — S80861A Insect bite (nonvenomous), right lower leg, initial encounter: Secondary | ICD-10-CM | POA: Diagnosis not present

## 2021-09-12 DIAGNOSIS — W57XXXA Bitten or stung by nonvenomous insect and other nonvenomous arthropods, initial encounter: Secondary | ICD-10-CM | POA: Diagnosis not present

## 2021-09-12 MED ORDER — DOXYCYCLINE HYCLATE 100 MG PO CAPS: 100.0000 mg | ORAL_CAPSULE | Freq: Two times a day (BID) | ORAL | 0 refills | Status: AC

## 2021-09-12 NOTE — ED Triage Notes (Signed)
Pt reports noticing a tick bite on the left lower leg this morning. States wife was able to remove the tick.

## 2021-09-12 NOTE — Discharge Instructions (Addendum)
Take the doxycycline as directed.  Follow up with your primary care provider tomorrow.

## 2021-09-12 NOTE — ED Provider Notes (Signed)
[name]    CSN: [id number] Arrival date & time: 09/12/21  1118      History   Chief Complaint Chief Complaint  Patient presents with   Insect Bite    Tick    HPI [name] is a 76 y.o. male.  Patient presents with redness of his right lower leg around the site of a tick bite.  He noted the tick this morning and his wife removed it intact.  He denies fever, chills, or other symptoms.  He has scarring on his right lower leg from an accident as a child.    The history is provided by the patient and medical records.   Past Medical History:  Diagnosis Date   A-fib ([place])    Arthritis    GERD (gastroesophageal reflux disease)    H/O urinary frequency    Heart murmur    as a child   History of bronchitis    HSV infection    oral   Hypertension    improved after back pain treated with surgery   Insomnia    Insomnia    Improved with Ambien, did not tolerate melatonin.   Joint pain    Parkinson disease ([place])    Sleep apnea    no CPAP   Vertigo 10/13/2014   recurrent; trigger is cool air on left ear, improved with daily antihistamine    Patient Active Problem List   Diagnosis Date Noted   Paroxysmal atrial fibrillation ([place]) 07/15/2021   Irritant dermatitis 04/07/2021   Tinea pedis of both feet 04/07/2021   Peripheral edema 04/07/2021   Acute bronchitis 02/28/2021   Other fatigue 01/02/2021   Change in bowel habit 08/06/2020   Healthcare maintenance 03/31/2020   Hypothyroidism 03/31/2020   Dysfunction of eustachian tube 03/10/2020   Constipation 11/19/2019   Insomnia    Allergic rhinitis 07/03/2019   Hyponatremia 05/15/2019   Atrial fibrillation, chronic ([place]) 05/15/2019   Parkinson's disease ([place]) 05/04/2019   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[place])    Atrial fibrillation with RVR ([place]) 04/24/2019   History of recent back surgery 04/24/2019   Body mass index (BMI) 25.0-25.9, adult 04/22/2019   Neural  foraminal stenosis of lumbosacral spine 03/31/2019   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   Degenerative scoliosis in adult patient 03/20/2019   Other intervertebral disc displacement, lumbosacral region 03/20/2019   Status post lumbar spinal fusion 02/20/2019   Neck pain 02/04/2019   Acute cough 04/24/2018   Skin lesion 10/31/2017   Lower urinary tract symptoms (LUTS) 04/17/2016   Oral lesion 02/16/2016   Medicare annual wellness visit, initial 12/01/2015   Advance care planning 12/01/2015   Lower back pain 12/01/2015   OSA on CPAP    HTN (hypertension) 04/23/2015   Spondylolisthesis of lumbar region 12/16/2014   GERD (gastroesophageal reflux disease) 10/11/2012   RBBB 06/27/2011    Past Surgical History:  Procedure Laterality Date   ACHILLES TENDON REPAIR Left    ALLOGRAFT APPLICATION Left 3/41/9622   Procedure: APPLICATION OF  INTEGRA;  Surgeon: Irene Limbo, MD;  Location: [facility];  Service: Plastics;  Laterality: Left;   BACK SURGERY     COLONOSCOPY  02/2011   Negative, GI   DEBRIDEMENT AND CLOSURE WOUND Left 02/25/2018   Procedure: DEBRIDEMENT LEFT LEG APPLICATION INTEGRA;  Surgeon: Irene Limbo, MD;  Location: [facility];  Service: Plastics;  Laterality: Left;   EYE SURGERY Bilateral    cataracts  INCISION AND DRAINAGE OF WOUND Left 11/12/2020   Procedure: DEBRIDEMENT LLE ULCER;  Surgeon: Irene Limbo, MD;  Location: [facility];  Service: Plastics;  Laterality: Left;   KNEE ARTHROSCOPY     L   LUMBAR FUSION     2016   LUMBAR SPINE SURGERY  03/31/2019   exploration of lumbar fusion w/L5-S1 decompression redo L4-5 fusion   SPINE SURGERY  2003   cervical fusion   TONSILLECTOMY     varicoelectomy         Home Medications    Prior to Admission medications   Medication Sig Start Date End Date Taking? Authorizing Provider  doxycycline (VIBRAMYCIN) 100 MG capsule Take 1  capsule (100 mg total) by mouth 2 (two) times daily for 10 days. 09/12/21 09/22/21 Yes Sharion Balloon, NP  atorvastatin (LIPITOR) 20 MG tablet Take 1 tablet (20 mg total) by mouth daily. 08/05/21 07/31/22  Cantwell, Celeste C, PA-C  cetirizine (ZYRTEC) 10 MG tablet Take 10 mg by mouth daily.    [provider]  clotrimazole (LOTRIMIN) 1 % cream Apply 1 application topically 2 (two) times daily. 04/07/21   Bedsole, Amy E, MD  diclofenac Sodium (VOLTAREN) 1 % GEL Apply topically 4 (four) times daily.    [provider]  diltiazem (CARDIZEM CD) 180 MG 24 hr capsule TAKE 1 CAPSULE(240 MG) BY MOUTH DAILY 08/12/21   Adrian Prows, MD  fluticasone (FLONASE) 50 MCG/ACT nasal spray Place 2 sprays into both nostrils daily. 03/09/20   Tonia Ghent, MD  omeprazole (PRILOSEC) 20 MG capsule Take 20 mg by mouth daily as needed (heart burn).    [provider]  potassium chloride (KLOR-CON) 10 MEQ tablet TAKE 1 TABLET BY MOUTH TWICE A DAY 09/07/21   Cantwell, Celeste C, PA-C  pramipexole (MIRAPEX) 0.5 MG tablet TAKE 1 TABLET(0.5 MG) BY MOUTH THREE TIMES DAILY 02/21/21   Tat, Eustace Quail, DO  propranolol (INDERAL) 20 MG tablet Take 20 mg by mouth 2 (two) times daily.    [provider]  traMADol (ULTRAM) 50 MG tablet TAKE 1 TABLET (50 MG TOTAL) BY MOUTH EVERY 12 (TWELVE) HOURS AS NEEDED. 03/24/20   Tonia Ghent, MD  triamcinolone cream (KENALOG) 0.5 % Apply 1 application topically 2 (two) times daily. Use on leg rash. 04/07/21   Jinny Sanders, MD    Family History Family History  Problem Relation Age of Onset   Hypertension Mother    Asthma Mother    Diabetes Father    Stroke Father    Heart attack Paternal Grandfather        >55   Cancer Sister    Colon cancer Neg Hx    Prostate cancer Neg Hx     Social History Social History   Tobacco Use   Smoking status: Never   Smokeless tobacco: Never  Vaping Use   Vaping Use: Never used  Substance Use Topics   Alcohol use: No    Drug use: No     Allergies   Flecainide, Flexeril [cyclobenzaprine], Hctz [hydrochlorothiazide], Morphine and related, Promethazine, and Sinemet [carbidopa-levodopa]   Review of Systems Review of Systems  Constitutional:  Negative for chills and fever.  Musculoskeletal:  Negative for arthralgias and joint swelling.  Skin:  Positive for color change and wound.  All other systems reviewed and are negative.   Physical Exam Triage Vital Signs ED Triage Vitals  Enc Vitals Group     BP 09/12/21 1140 129/80  Pulse Rate 09/12/21 1140 69     Resp 09/12/21 1140 18     Temp 09/12/21 1140 97.9 F (36.6 C)     Temp src --      SpO2 09/12/21 1140 95 %     Weight --      Height --      Head Circumference --      Peak Flow --      Pain Score 09/12/21 1141 0     Pain Loc --      Pain Edu? --      Excl. in [place]? --    No data found.  Updated Vital Signs BP 129/80   Pulse 69   Temp 97.9 F (36.6 C)   Resp 18   SpO2 95%   Visual Acuity Right Eye Distance:   Left Eye Distance:   Bilateral Distance:    Right Eye Near:   Left Eye Near:    Bilateral Near:     Physical Exam Vitals and nursing note reviewed.  Constitutional:      General: He is not in acute distress.    Appearance: Normal appearance. He is well-developed. He is not ill-appearing.  HENT:     Mouth/Throat:     Mouth: Mucous membranes are moist.  Cardiovascular:     Rate and Rhythm: Normal rate and regular rhythm.  Pulmonary:     Effort: Pulmonary effort is normal. No respiratory distress.  Musculoskeletal:        General: No swelling or tenderness. Normal range of motion.     Cervical back: Neck supple.  Skin:    General: Skin is warm and dry.     Capillary Refill: Capillary refill takes less than 2 seconds.     Findings: Erythema and lesion present.     Comments: Lesion on RLL with localized erythema. See picture.   Neurological:     Mental Status: He is alert.     Sensory: No sensory deficit.      Motor: No weakness.  Psychiatric:        Mood and Affect: Mood normal.        Behavior: Behavior normal.      UC Treatments / Results  Labs (all labs ordered are listed, but only abnormal results are displayed) Labs Reviewed - No data to display  EKG   Radiology No results found.  Procedures Procedures (including critical care time)  Medications Ordered in UC Medications - No data to display  Initial Impression / Assessment and Plan / UC Course  I have reviewed the triage vital signs and the nursing notes.  Pertinent labs & imaging results that were available during my care of the patient were reviewed by me and considered in my medical decision making (see chart for details).  Cellulitis of right lower leg, tick bite on right lower leg.  Patient has significant scarring on his right lower leg from an accident that occurred as a child.  He states that the redness is new and is related to the tick bite.  Treating with doxycycline.  Education provided on tick bites.  Instructed patient to follow-up with his PCP tomorrow.  He agrees to plan of care.   Final Clinical Impressions(s) / UC Diagnoses   Final diagnoses:  Cellulitis of right lower leg  Tick bite of right lower leg, initial encounter     Discharge Instructions      Take the doxycycline as directed.  Follow up with your primary  care provider tomorrow.        ED Prescriptions     Medication Sig Dispense Auth. Provider   doxycycline (VIBRAMYCIN) 100 MG capsule Take 1 capsule (100 mg total) by mouth 2 (two) times daily for 10 days. 20 capsule Sharion Balloon, NP      PDMP not reviewed this encounter.   Sharion Balloon, NP 09/12/21 1229

## 2021-09-19 ENCOUNTER — Other Ambulatory Visit: Payer: Self-pay | Admitting: Cardiology

## 2021-09-19 DIAGNOSIS — I10 Essential (primary) hypertension: Secondary | ICD-10-CM

## 2021-09-20 ENCOUNTER — Telehealth: Payer: Self-pay | Admitting: Neurology

## 2021-09-20 ENCOUNTER — Other Ambulatory Visit: Payer: Self-pay | Admitting: Student

## 2021-09-20 DIAGNOSIS — M5441 Lumbago with sciatica, right side: Secondary | ICD-10-CM | POA: Diagnosis not present

## 2021-09-20 DIAGNOSIS — M5442 Lumbago with sciatica, left side: Secondary | ICD-10-CM | POA: Diagnosis not present

## 2021-09-20 DIAGNOSIS — Z981 Arthrodesis status: Secondary | ICD-10-CM | POA: Diagnosis not present

## 2021-09-20 DIAGNOSIS — G8929 Other chronic pain: Secondary | ICD-10-CM | POA: Diagnosis not present

## 2021-09-20 DIAGNOSIS — R600 Localized edema: Secondary | ICD-10-CM

## 2021-09-20 DIAGNOSIS — G20 Parkinson's disease: Secondary | ICD-10-CM

## 2021-09-20 MED ORDER — PRAMIPEXOLE DIHYDROCHLORIDE 0.5 MG PO TABS: ORAL_TABLET | ORAL | 0 refills | Status: DC

## 2021-09-20 NOTE — Telephone Encounter (Signed)
Called patient and left a detailed message per DPR that his rx has been sent to his pharmacy.

## 2021-09-20 NOTE — Telephone Encounter (Signed)
1. Which medications need refilled? (List name and dosage, if known) pramipexole  2. Which pharmacy/location is medication to be sent to? (include street and city if local pharmacy) [place]  He is out and didn't take any yesterday or today

## 2021-09-21 ENCOUNTER — Other Ambulatory Visit: Payer: Self-pay | Admitting: Neurosurgery

## 2021-09-21 DIAGNOSIS — G8929 Other chronic pain: Secondary | ICD-10-CM

## 2021-09-23 NOTE — Progress Notes (Unsigned)
Assessment/Plan:   1.  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  He is having more tremor, but that may be b/c of the increased back pain right now.  We also discussed levodopa resist tremor.  However, he wanted to consider dbs and we wouldn't do that without being on carbidopa/levodopa.  He tried carbidopa/levodopa years ago but it was before he knew he had a-fib and though that the palpitations were from levodopa and d/c it.    2.  Atrial fibrillation  -On Xarelto.  Follows with Dr. Einar Gip  3.  Insomnia  -Continue mirtazapine, 15 mg at bed   4.  Back pain  -Sees Dr. Arnoldo Morale.  Last saw him about a week ago.  Pt reports he is considering sx intervention for spinal stenosis soon   Subjective:   [name] was seen today in follow up for Parkinsons disease.  My previous records were reviewed prior to todays visit as well as outside records available to me.   Wife with patient who supplements the history.  He is having more tremor.  Pt denies falls.  Pt denies lightheadedness, near syncope.  No hallucinations.  Mood has been good.  He was recently in the emergency room with cellulitis.  Patient reports that has been better.  Would like to consider dbs.  Tremor worse.  Back pain worse and likely going to have sx.  Tremor worse x 1 month  Current prescribed movement disorder medications: Pramipexole 0.5 mg three times per day Mirtazapine, 15 mg at bed  PREVIOUS MEDICATIONS: Levodopa (patient thought he had chest pain and palpitations with it, but no longer there after was diagnosed with new onset A. fib)  ALLERGIES:   Allergies  Allergen Reactions   Flecainide     Vomit    Flexeril [Cyclobenzaprine]     Constipation   Hctz [Hydrochlorothiazide]     hyponatremia   Morphine And Related Hives    Hallucinations- can tolerate oxycodone.    Promethazine     Would avoid due to  Parkinsons   Sinemet [Carbidopa-Levodopa]     intolerant    CURRENT MEDICATIONS:  Outpatient Encounter Medications as of 09/27/2021  Medication Sig   clotrimazole (LOTRIMIN) 1 % cream Apply 1 application topically 2 (two) times daily.   diclofenac Sodium (VOLTAREN) 1 % GEL Apply topically 4 (four) times daily.   diltiazem (CARDIZEM CD) 180 MG 24 hr capsule TAKE 1 CAPSULE BY MOUTH EVERY DAY   fluticasone (FLONASE) 50 MCG/ACT nasal spray Place 2 sprays into both nostrils daily.   omeprazole (PRILOSEC) 20 MG capsule Take 20 mg by mouth daily as needed (heart burn).   potassium chloride (KLOR-CON) 10 MEQ tablet TAKE 1 TABLET BY MOUTH TWICE A DAY   pramipexole (MIRAPEX) 0.5 MG tablet TAKE 1 TABLET(0.5 MG) BY MOUTH THREE TIMES DAILY   propranolol (INDERAL) 20 MG tablet Take 20 mg by mouth 2 (two) times daily.   traMADol (ULTRAM) 50 MG tablet TAKE 1 TABLET (50 MG TOTAL) BY MOUTH EVERY 12 (TWELVE) HOURS AS NEEDED.   triamcinolone cream (KENALOG) 0.5 % Apply 1 application topically 2 (two) times daily. Use on leg rash.   atorvastatin (LIPITOR) 20 MG tablet Take 1 tablet (20 mg total) by mouth daily. (Patient not taking: Reported on 09/27/2021)   cetirizine (ZYRTEC) 10 MG tablet Take 10 mg by mouth daily. (Patient not taking: Reported on 09/27/2021)  No facility-administered encounter medications on file as of 09/27/2021.    Objective:   PHYSICAL EXAMINATION:    VITALS:   Vitals:   09/27/21 0810  BP: 120/70  Pulse: 66  SpO2: 95%  Weight: 204 lb 9.6 oz (92.8 kg)  Height: '6\' 3"'$  (1.905 m)      GEN:  The patient appears stated age and is in NAD. HEENT:  Normocephalic, atraumatic.  The mucous membranes are moist. The superficial temporal arteries are without ropiness or tenderness. CV:  RRR Lungs:  CTAB Neck/HEME:  There are no carotid bruits bilaterally.  Neurological examination:  Orientation: The patient is alert and oriented x3. Cranial nerves: There is good facial symmetry with  facial hypomimia. The speech is fluent and clear. Soft palate rises symmetrically and there is no tongue deviation. Hearing is intact to conversational tone. Sensation: Sensation is intact to light touch throughout Motor: Strength is at least antigravity x4.  Movement examination: Tone: There is nl tone in the UE/LE Abnormal movements: he has near constant RUE rest tremor Coordination:  There is mild decremation with finger taps on the R.  He has slow toe taps on the L Gait and Station: The patient has no difficulty arising out of a deep-seated chair without the use of the hands. The patient's stride length is decreased and he is forward flexed (he is having back pain).  He is shuffling more.  Some dyskinesia of R hand (behind back with ambulation)  I have reviewed and interpreted the following labs independently    Chemistry      Component Value Date/Time   NA 138 07/15/2021 1230   NA 139 07/01/2020 1021   K 4.7 07/15/2021 1230   CL 101 07/15/2021 1230   CO2 30 07/15/2021 1230   BUN 13 07/15/2021 1230   BUN 12 07/01/2020 1021   CREATININE 1.43 07/15/2021 1230   CREATININE 1.32 (H) 01/14/2019 0958      Component Value Date/Time   CALCIUM 9.4 07/15/2021 1230   ALKPHOS 74 07/15/2021 1230   AST 15 07/15/2021 1230   ALT 13 07/15/2021 1230   BILITOT 0.8 07/15/2021 1230       Lab Results  Component Value Date   WBC 7.3 07/15/2021   HGB 14.2 07/15/2021   HCT 42.1 07/15/2021   MCV 89.4 07/15/2021   PLT 226.0 07/15/2021    Lab Results  Component Value Date   TSH 3.50 07/15/2021   Total time spent on today's visit was 34 minutes, including both face-to-face time and nonface-to-face time.  Time included that spent on review of records (prior notes available to me/labs/imaging if pertinent), discussing treatment and goals, answering patient's questions and coordinating care.   Cc:  Tonia Ghent, MD

## 2021-09-26 ENCOUNTER — Ambulatory Visit
Admission: RE | Admit: 2021-09-26 | Discharge: 2021-09-26 | Disposition: A | Discharge status: Home / Self Care | Payer: PPO | Source: Ambulatory Visit | Attending: Neurosurgery | Admitting: Neurosurgery

## 2021-09-26 DIAGNOSIS — G8929 Other chronic pain: Secondary | ICD-10-CM

## 2021-09-26 DIAGNOSIS — M48061 Spinal stenosis, lumbar region without neurogenic claudication: Secondary | ICD-10-CM | POA: Diagnosis not present

## 2021-09-26 DIAGNOSIS — M4316 Spondylolisthesis, lumbar region: Secondary | ICD-10-CM | POA: Diagnosis not present

## 2021-09-26 DIAGNOSIS — M5126 Other intervertebral disc displacement, lumbar region: Secondary | ICD-10-CM | POA: Diagnosis not present

## 2021-09-26 MED ORDER — ONDANSETRON HCL 4 MG/2ML IJ SOLN
4.0000 mg | Freq: Once | INTRAMUSCULAR | Status: DC | PRN
Start: 2021-09-26 — End: 2021-09-27

## 2021-09-26 MED ORDER — IOPAMIDOL (ISOVUE-M 200) INJECTION 41%
15.0000 mL | Freq: Once | INTRAMUSCULAR | Status: AC
  Administered 2021-09-26: 15 mL via INTRATHECAL

## 2021-09-26 MED ORDER — DIAZEPAM 5 MG PO TABS
5.0000 mg | ORAL_TABLET | Freq: Once | ORAL | Status: AC
  Administered 2021-09-26: 5 mg via ORAL

## 2021-09-26 MED ORDER — MEPERIDINE HCL 50 MG/ML IJ SOLN: 50.0000 mg | Freq: Once | INTRAMUSCULAR | Status: DC | PRN

## 2021-09-26 NOTE — Discharge Instructions (Signed)

## 2021-09-27 ENCOUNTER — Other Ambulatory Visit: Payer: Self-pay | Admitting: Neurology

## 2021-09-27 ENCOUNTER — Ambulatory Visit: Payer: PPO | Admitting: Neurology

## 2021-09-27 ENCOUNTER — Encounter: Payer: Self-pay | Admitting: Neurology

## 2021-09-27 VITALS — BP 120/70 | HR 66 | Ht 75.0 in | Wt 204.6 lb

## 2021-09-27 DIAGNOSIS — G20 Parkinson's disease: Secondary | ICD-10-CM

## 2021-09-27 MED ORDER — CARBIDOPA-LEVODOPA 25-100 MG PO TABS: 1.0000 | ORAL_TABLET | Freq: Three times a day (TID) | ORAL | 1 refills | Status: DC

## 2021-09-27 NOTE — Patient Instructions (Addendum)
Start Carbidopa Levodopa as follows: Take 1/2 tablet three times daily, at least 30 minutes before meals (approximately 8am/noon/4pm), for one week Then take 1/2 tablet in the morning, 1/2 tablet in the afternoon, 1 tablet in the evening, at least 30 minutes before meals, for one week Then take 1/2 tablet in the morning, 1 tablet in the afternoon, 1 tablet in the evening, at least 30 minutes before meals, for one week Then take 1 tablet three times daily at 8am/noon/4pm, at least 30 minutes before meals   As a reminder, carbidopa/levodopa can be taken at the same time as a carbohydrate, but we like to have you take your pill either 30 minutes before a protein source or 1 hour after as protein can interfere with carbidopa/levodopa absorption.  Local and Online Resources for Power over Parkinson's Group June 2023  LOCAL [place] PARKINSON'S GROUPS  Power over Parkinson's Group:   Power Over Parkinson's Patient Education Group will be Wednesday, June 14th-*Hybrid meting*- in person at [facility] location and via [facility] at 2:00 pm.   Upcoming [employer] over [employer] Meetings:  2nd Wednesdays of the month at 2 pm:   June 14th, July 12th Contact Amy Marriott at [email or web address] if interested in participating in this group Parkinson's Care Partners Group:    3rd Mondays, Contact Misty Paladino Atypical Parkinsonian Patient Group:   4th Wednesdays, Contact Misty Paladino If you are interested in participating in these groups with Misty, please contact her directly for how to join those meetings.  Her contact information is [email or web address].    LOCAL EVENTS AND NEW OFFERINGS Dance Class for People with Parkinson's at [facility]  Friday, June 9th at 2 pm.  Dora Sims by Tyler Deis DPT students.  Contact [email or web address] to register or with questions. Ice Cream Social at [place]!  Thursday, June 15th, 5:30-7:00 pm.  RSVP to [email or web address] for attendance and free  ice cream. Parkinson's T-shirts for sale!  Designed by a local group member, with funds going to [place].  $25.00  Investment banker, corporate to purchase  [employer]! Moves [employer] Instructor-Led Class offering at [employer]!  Wednesdays 1-2 pm, starting April 12th.   Contact Bryson Dames, Acupuncturist at [employer].  [email or web address] [email or web address]  [place]:  [email or web address] PD Health at Home continues:  Mindfulness Mondays, Wellness Wednesdays, Fitness Fridays  Upcoming Education: Parkinson's 101:  What You and Your Family Should Know.  Wednesday, June 7th at 1:00 pm Register for expert briefings (webinars) at [email or web address] Please check out their website to sign up for emails and see their full online offerings   [place]:  [email or web address]  Third Thursday Webinars:  On the third Thursday of every month at 12 p.m. ET, join our free live webinars to learn about various aspects of living with Parkinson's disease and our work to speed medical breakthroughs. Upcoming Webinar: REPLAY:  From Low Blood Pressure to Bladder Problems:  A Look at Lesser Known Parkinson's Symptoms.  Thursday, June 15th at 12 noon. Check out additional information on their website to see their full online offerings  [facility]:  [email or web address] Upcoming Webinar:   Stay tuned Webinar Series:  Living with Parkinson's Meetup.   Third Thursdays each month, 3 pm Care Partner Monthly Meetup.  With Robin Searing [place]inney.  First Tuesday of each month, 2 pm Check out additional information to Live Well Today on their website  Parkinson and Movement Disorders (PMD) Alliance:  www.pmdalliance.org [employer] Online:  Online Education Events Sign up  for emails, which are sent weekly to give you updates on programming and online offerings  Parkinson's  Association of the [place]:  www.[email or web address] Information on online support groups, education events, and online exercises including Yoga, Parkinson's exercises and more-LOTS of information on links to PD resources and online events Virtual Support Group through Parkinson's Association of the [place]; next one is scheduled for Wednesday, June 7th at 2 pm. (These are typically scheduled for the 1st Wednesday of the month at 2 pm).  Visit website for details. Save the date for "Caring for Parkinson's-Caring for You", 9th Annual Symposium.  In-person event in [place].  September 9th.  More info on registration to come. MOVEMENT AND EXERCISE OPPORTUNITIES PWR! Moves Classes at [place].  Wednesdays 10 and 11 am.   Contact Amy Marriott, PT [email or web address] if interested. NEW PWR! Moves Class offering at [employer].  Wednesdays 1-2 pm, starting April 12th.  Contact Bryson Dames, Acupuncturist at [employer].  [email or web address] Here is a link to the PWR!Moves classes on Zoom from [place] - Daily Mon-Sat at 10:00. Via Zoom, FREE and open to all.  There is also a link below via Facebook if you use that platform.  [email or web address].si [email or web address]  Parkinson's Wellness Recovery (PWR! Moves)  www.[email or web address] Info on the PWR! Virtual Experience:  You will have access to our expertise through self-assessment, guided plans that start with the PD-specific fundamentals, educational content, tips, Q&A with an expert, and a growing Art therapist of PD-specific pre-recorded and live exercise classes of varying types and intensity - both physical and cognitive! If that is not enough, we offer 1:1 wellness consultations (in-person or virtual) to personalize  your PWR! Research scientist (medical).  [place] Fridays:  As part of the PD Health @ Home program, this free video series focuses each week on one aspect of fitness designed to support people living with Parkinson's.  These weekly videos highlight the [place] recent fitness guidelines for people with Parkinson's disease. ModemGamers.si Dance for PD website is offering free, live-stream classes throughout the week, as well as links to [employer] of classes:  [email or web address]/ Virtual dance and Pilates for Parkinson's classes: Click on the Community Tab> Parkinson's Movement Initiative Tab.  To register for classes and for more information, visit [email or web address] and click the "community" tab.  [employer] Parkinson's Cycling Classes  [employer]:  Thursdays @ Noon-Live classes at [employer] ([employer] at [email or web address] or [phone]) [employer]: Virtual Classes Mondays and Thursdays Jeanette Caprice classes Tuesday, Wednesday and Thursday (contact [place] at [email or web address]  or [phone]) [place] Varied levels of classes are offered Tuesdays and Thursdays at [facility].  Stretching with Verdis Frederickson weekly class is also offered for people with Parkinson's To observe a class or for more information, call [phone] or email Hezzie Bump at [email or web address] ADDITIONAL SUPPORT AND RESOURCES Well[employer]:Online Caregiver Education Opportunities:  www.well-springsolutions.org/caregiver-education/caregiver-support-group.  You may also contact Vickki Muff at [email or web address] or [phone].    Well-Spring Navigator:  [phone] program, a free service to help individuals and families through the journey of determining care for older adults.  The "Navigator" is a [employer], Education officer, museum, who will  speak with a prospective client and/or loved ones to provide an assessment of the situation and a set of recommendations for a personalized care plan -- all free of charge, and whether [employer] offers the needed service or not. If the need is not a service we provide, we are well-connected with reputable programs  in town that we can refer you to.  www.well-springsolutions.org or to speak with the Navigator, call [phone]. Family Caregiver Programming in June:  [employer], Thursday, June 15th 11-12:30 at [place]. [place].  Call [phone] to register

## 2021-10-04 ENCOUNTER — Other Ambulatory Visit: Payer: Self-pay | Admitting: Student

## 2021-10-04 DIAGNOSIS — R600 Localized edema: Secondary | ICD-10-CM

## 2021-10-05 DIAGNOSIS — Z6825 Body mass index (BMI) 25.0-25.9, adult: Secondary | ICD-10-CM | POA: Diagnosis not present

## 2021-10-05 DIAGNOSIS — M48062 Spinal stenosis, lumbar region with neurogenic claudication: Secondary | ICD-10-CM | POA: Diagnosis not present

## 2021-10-05 DIAGNOSIS — M4316 Spondylolisthesis, lumbar region: Secondary | ICD-10-CM | POA: Diagnosis not present

## 2021-10-06 ENCOUNTER — Other Ambulatory Visit: Payer: Self-pay | Admitting: Neurosurgery

## 2021-10-07 ENCOUNTER — Ambulatory Visit: Payer: HMO | Admitting: Student

## 2021-10-12 NOTE — Progress Notes (Signed)
Surgical Instructions    Your procedure is scheduled on Tuesday July 5th.  Report to [facility] Main Entrance "A" at 9:30 A.M., then check in with the Admitting office.  Call this number if you have problems the morning of surgery:  [phone]   If you have any questions prior to your surgery date call [phone]: Open Monday-Friday 8am-4pm    Remember:  Do not eat or drink after midnight the night before your surgery     Take these medicines the morning of surgery with A SIP OF WATER:  carbidopa-levodopa (SINEMET IR) 25-100 MG tablet fluticasone (FLONASE) 50 MCG/ACT nasal spray omeprazole (PRILOSEC) 20 MG capsule potassium chloride (KLOR-CON) 10 MEQ tablet propranolol (INDERAL) 20 MG tablet pramipexole (MIRAPEX) 0.5 MG tablet  IF NEEDED acetaminophen (TYLENOL) 650 MG CR tablet traMADol (ULTRAM) 50 MG tablet     As of today, STOP taking any Aspirin (unless otherwise instructed by your surgeon) Voltaren, Aleve, Naproxen, Ibuprofen, Motrin, Advil, Goody's, BC's, all herbal medications, fish oil, and all vitamins.           Do not wear jewelry  Do not wear lotions, powders, colognes, or deodorant. Do not shave 48 hours prior to surgery.  Men may shave face and neck. Do not bring valuables to the hospital. Do not wear nail polish  [place] is not responsible for any belongings or valuables. .   Do NOT Smoke (Tobacco/Vaping)  24 hours prior to your procedure  If you use a CPAP at night, you may bring your mask for your overnight stay.   Contacts, glasses, hearing aids, dentures or partials may not be worn into surgery, please bring cases for these belongings   For patients admitted to the hospital, discharge time will be determined by your treatment team.   Patients discharged the day of surgery will not be allowed to drive home, and someone needs to stay with them for 24 hours.   SURGICAL WAITING ROOM VISITATION Patients having surgery or a procedure in a  hospital may have two support people. Children under the age of 44 must have an adult with them who is not the patient. They may stay in the waiting area during the procedure and may switch out with other visitors. If the patient needs to stay at the hospital during part of their recovery, the visitor guidelines for inpatient rooms apply.  Please refer to the [facility] website for the visitor guidelines for Inpatients (after your surgery is over and you are in a regular room).       Special instructions:    Oral Hygiene is also important to reduce your risk of infection.  Remember - BRUSH YOUR TEETH THE MORNING OF SURGERY WITH YOUR REGULAR TOOTHPASTE   [place]- Preparing For Surgery  Before surgery, you can play an important role. Because skin is not sterile, your skin needs to be as free of germs as possible. You can reduce the number of germs on your skin by washing with CHG (chlorahexidine gluconate) Soap before surgery.  CHG is an antiseptic cleaner which kills germs and bonds with the skin to continue killing germs even after washing.     Please do not use if you have an allergy to CHG or antibacterial soaps. If your skin becomes reddened/irritated stop using the CHG.  Do not shave (including legs and underarms) for at least 48 hours prior to first CHG shower. It is OK to shave your face.  Please follow these instructions carefully.  Shower the NIGHT BEFORE SURGERY and the MORNING OF SURGERY with CHG Soap.   If you chose to wash your hair, wash your hair first as usual with your normal shampoo. After you shampoo, rinse your hair and body thoroughly to remove the shampoo.  Then [employer] and genitals (private parts) with your normal soap and rinse thoroughly to remove soap.  After that Use CHG Soap as you would any other liquid soap. You can apply CHG directly to the skin and wash gently with a scrungie or a clean washcloth.   Apply the CHG Soap to your body ONLY FROM THE  NECK DOWN.  Do not use on open wounds or open sores. Avoid contact with your eyes, ears, mouth and genitals (private parts). Wash Face and genitals (private parts)  with your normal soap.   Wash thoroughly, paying special attention to the area where your surgery will be performed.  Thoroughly rinse your body with warm water from the neck down.  DO NOT shower/wash with your normal soap after using and rinsing off the CHG Soap.  Pat yourself dry with a CLEAN TOWEL.  Wear CLEAN PAJAMAS to bed the night before surgery  Place CLEAN SHEETS on your bed the night before your surgery  DO NOT SLEEP WITH PETS.   Day of Surgery:  Take a shower with CHG soap. Wear Clean/Comfortable clothing the morning of surgery Do not apply any deodorants/lotions.   Remember to brush your teeth WITH YOUR REGULAR TOOTHPASTE.    If you received a COVID test during your pre-op visit, it is requested that you wear a mask when out in public, stay away from anyone that may not be feeling well, and notify your surgeon if you develop symptoms. If you have been in contact with anyone that has tested positive in the last 10 days, please notify your surgeon.    Please read over the following fact sheets that you were given.

## 2021-10-13 ENCOUNTER — Other Ambulatory Visit: Payer: Self-pay

## 2021-10-13 ENCOUNTER — Encounter (HOSPITAL_COMMUNITY)
Admission: RE | Admit: 2021-10-13 | Discharge: 2021-10-13 | Disposition: A | Discharge status: Home / Self Care | Payer: PPO | Source: Ambulatory Visit | Attending: Neurosurgery | Admitting: Neurosurgery

## 2021-10-13 ENCOUNTER — Encounter (HOSPITAL_COMMUNITY): Payer: Self-pay

## 2021-10-13 VITALS — BP 120/84 | HR 59 | Temp 97.8°F | Resp 18 | Ht 75.0 in | Wt 205.0 lb

## 2021-10-13 DIAGNOSIS — Z01812 Encounter for preprocedural laboratory examination: Secondary | ICD-10-CM | POA: Insufficient documentation

## 2021-10-13 DIAGNOSIS — G4733 Obstructive sleep apnea (adult) (pediatric): Secondary | ICD-10-CM | POA: Diagnosis not present

## 2021-10-13 DIAGNOSIS — I10 Essential (primary) hypertension: Secondary | ICD-10-CM | POA: Diagnosis not present

## 2021-10-13 DIAGNOSIS — Z79899 Other long term (current) drug therapy: Secondary | ICD-10-CM | POA: Diagnosis not present

## 2021-10-13 DIAGNOSIS — G20 Parkinson's disease: Secondary | ICD-10-CM | POA: Insufficient documentation

## 2021-10-13 DIAGNOSIS — Z01818 Encounter for other preprocedural examination: Secondary | ICD-10-CM

## 2021-10-13 DIAGNOSIS — I4510 Unspecified right bundle-branch block: Secondary | ICD-10-CM | POA: Insufficient documentation

## 2021-10-13 LAB — BASIC METABOLIC PANEL
Anion gap: 10 (ref 5–15)
BUN: 9 mg/dL (ref 8–23)
CO2: 25 mmol/L (ref 22–32)
Calcium: 9 mg/dL (ref 8.9–10.3)
Chloride: 99 mmol/L (ref 98–111)
Creatinine, Ser: 1.15 mg/dL (ref 0.61–1.24)
GFR, Estimated: >60 mL/min (ref >60)
Glucose, Bld: 98 mg/dL (ref 70–99)
Potassium: 3.9 mmol/L (ref 3.5–5.1)
Sodium: 134 mmol/L — ABNORMAL LOW (ref 135–145)

## 2021-10-13 LAB — TYPE AND SCREEN
ABO/RH(D): B POS
Antibody Screen: NEGATIVE

## 2021-10-13 LAB — SURGICAL PCR SCREEN
MRSA, PCR: NEGATIVE
Staphylococcus aureus: NEGATIVE

## 2021-10-13 LAB — CBC
HCT: 40.4 % (ref 39.0–52.0)
Hemoglobin: 13.4 g/dL (ref 13.0–17.0)
MCH: 29.6 pg (ref 26.0–34.0)
MCHC: 33.2 g/dL (ref 30.0–36.0)
MCV: 89.4 fL (ref 80.0–100.0)
Platelets: 208 10*3/uL (ref 150–400)
RBC: 4.52 MIL/uL (ref 4.22–5.81)
RDW: 12.8 % (ref 11.5–15.5)
WBC: 8.2 10*3/uL (ref 4.0–10.5)
nRBC: 0 % (ref 0.0–0.2)

## 2021-10-13 NOTE — Progress Notes (Signed)
PCP - Elsie Stain, MD Cardiologist - Dr. Einar Gip Neurologist- Dr. Wells Guiles Tat  PPM/ICD - denies Chest x-ray - N/A EKG - 07/29/21 Stress Test - denies ECHO - 04/25/19 Cardiac Cath -denies   Sleep Study - pt states he does not use CPAP and unsure where sleep study was done in 2004  Aspirin Instructions: pt not on blood thinner or aspirin  ERAS Protcol -NPO order  COVID TEST- N/A   Anesthesia review: pt with hx Afib, no recent cardiac issues per patient. Does patient need any type of medical or cardiac clearance. Pt denies being told that he would need this.   Patient denies shortness of breath, fever, cough and chest pain at PAT appointment   All instructions explained to the patient, with a verbal understanding of the material. Patient agrees to go over the instructions while at home for a better understanding. Patient also instructed to self quarantine after being tested for COVID-19. The opportunity to ask questions was provided.

## 2021-10-13 NOTE — Progress Notes (Signed)
Surgical Instructions    Your procedure is scheduled on Thursday, July 6th.  Report to [facility] Main Entrance "A" at 9:30 A.M., then check in with the Admitting office.  Call this number if you have problems the morning of surgery:  [phone]   If you have any questions prior to your surgery date call [phone]: Open Monday-Friday 8am-4pm    Remember:  Do not eat or drink after midnight the night before your surgery     Take these medicines the morning of surgery with A SIP OF WATER:  carbidopa-levodopa (SINEMET IR) 25-100 MG tablet fluticasone (FLONASE) 50 MCG/ACT nasal spray omeprazole (PRILOSEC) 20 MG capsule potassium chloride (KLOR-CON) 10 MEQ tablet propranolol (INDERAL) 20 MG tablet pramipexole (MIRAPEX) 0.5 MG tablet  IF NEEDED acetaminophen (TYLENOL) 650 MG CR tablet traMADol (ULTRAM) 50 MG tablet     As of today, STOP taking any Aspirin (unless otherwise instructed by your surgeon) (diclofenac), Voltaren, Aleve, Naproxen, Ibuprofen, Motrin, Advil, Goody's, BC's, all herbal medications, fish oil, and all vitamins.           Do not wear jewelry  Do not wear lotions, powders, colognes, or deodorant. Do not shave 48 hours prior to surgery.  Men may shave face and neck. Do not bring valuables to the hospital. Do not wear nail polish  [place] is not responsible for any belongings or valuables. .   Do NOT Smoke (Tobacco/Vaping)  24 hours prior to your procedure  If you use a CPAP at night, you may bring your mask for your overnight stay.   Contacts, glasses, hearing aids, dentures or partials may not be worn into surgery, please bring cases for these belongings   For patients admitted to the hospital, discharge time will be determined by your treatment team.   Patients discharged the day of surgery will not be allowed to drive home, and someone needs to stay with them for 24 hours.   SURGICAL WAITING ROOM VISITATION Patients having surgery or a  procedure in a hospital may have two support people. Children under the age of 76 must have an adult with them who is not the patient. They may stay in the waiting area during the procedure and may switch out with other visitors. If the patient needs to stay at the hospital during part of their recovery, the visitor guidelines for inpatient rooms apply.  Please refer to the [facility] website for the visitor guidelines for Inpatients (after your surgery is over and you are in a regular room).       Special instructions:    Oral Hygiene is also important to reduce your risk of infection.  Remember - BRUSH YOUR TEETH THE MORNING OF SURGERY WITH YOUR REGULAR TOOTHPASTE   [place]- Preparing For Surgery  Before surgery, you can play an important role. Because skin is not sterile, your skin needs to be as free of germs as possible. You can reduce the number of germs on your skin by washing with CHG (chlorahexidine gluconate) Soap before surgery.  CHG is an antiseptic cleaner which kills germs and bonds with the skin to continue killing germs even after washing.     Please do not use if you have an allergy to CHG or antibacterial soaps. If your skin becomes reddened/irritated stop using the CHG.  Do not shave (including legs and underarms) for at least 48 hours prior to first CHG shower. It is OK to shave your face.  Please follow these instructions carefully.  Shower the NIGHT BEFORE SURGERY and the MORNING OF SURGERY with CHG Soap.   If you chose to wash your hair, wash your hair first as usual with your normal shampoo. After you shampoo, rinse your hair and body thoroughly to remove the shampoo.  Then [employer] and genitals (private parts) with your normal soap and rinse thoroughly to remove soap.  After that Use CHG Soap as you would any other liquid soap. You can apply CHG directly to the skin and wash gently with a scrungie or a clean washcloth.   Apply the CHG Soap to your body  ONLY FROM THE NECK DOWN.  Do not use on open wounds or open sores. Avoid contact with your eyes, ears, mouth and genitals (private parts). Wash Face and genitals (private parts)  with your normal soap.   Wash thoroughly, paying special attention to the area where your surgery will be performed.  Thoroughly rinse your body with warm water from the neck down.  DO NOT shower/wash with your normal soap after using and rinsing off the CHG Soap.  Pat yourself dry with a CLEAN TOWEL.  Wear CLEAN PAJAMAS to bed the night before surgery  Place CLEAN SHEETS on your bed the night before your surgery  DO NOT SLEEP WITH PETS.   Day of Surgery:  Take a shower with CHG soap. Wear Clean/Comfortable clothing the morning of surgery Do not apply any deodorants/lotions.   Remember to brush your teeth WITH YOUR REGULAR TOOTHPASTE.    If you received a COVID test during your pre-op visit, it is requested that you wear a mask when out in public, stay away from anyone that may not be feeling well, and notify your surgeon if you develop symptoms. If you have been in contact with anyone that has tested positive in the last 10 days, please notify your surgeon.    Please read over the following fact sheets that you were given.

## 2021-10-14 ENCOUNTER — Other Ambulatory Visit: Payer: Self-pay | Admitting: Student

## 2021-10-14 ENCOUNTER — Other Ambulatory Visit: Payer: Self-pay | Admitting: Neurology

## 2021-10-14 DIAGNOSIS — G20 Parkinson's disease: Secondary | ICD-10-CM

## 2021-10-14 MED ORDER — PROPRANOLOL HCL 20 MG PO TABS: 20.0000 mg | ORAL_TABLET | Freq: Two times a day (BID) | ORAL | 3 refills | Status: DC

## 2021-10-14 NOTE — Anesthesia Preprocedure Evaluation (Addendum)
Anesthesia Evaluation  Patient identified by MRN, date of birth, ID band Patient awake    Reviewed: Allergy & Precautions, NPO status , Patient's Chart, lab work & pertinent test results  Airway Mallampati: II  TM Distance: >3 FB Neck ROM: Full    Dental  (+) Dental Advisory Given   Pulmonary sleep apnea ,    breath sounds clear to auscultation       Cardiovascular hypertension, Pt. on medications and Pt. on home beta blockers + dysrhythmias (RBBB) Atrial Fibrillation  Rhythm:Regular Rate:Normal     Neuro/Psych  Neuromuscular disease (Parkinsons dz)    GI/Hepatic Neg liver ROS, GERD  ,  Endo/Other  Hypothyroidism   Renal/GU negative Renal ROS     Musculoskeletal   Abdominal   Peds  Hematology negative hematology ROS (+)   Anesthesia Other Findings   Reproductive/Obstetrics                            Lab Results  Component Value Date   WBC 8.2 10/13/2021   HGB 13.4 10/13/2021   HCT 40.4 10/13/2021   MCV 89.4 10/13/2021   PLT 208 10/13/2021   Lab Results  Component Value Date   CREATININE 1.15 10/13/2021   BUN 9 10/13/2021   NA 134 (L) 10/13/2021   K 3.9 10/13/2021   CL 99 10/13/2021   CO2 25 10/13/2021    Anesthesia Physical Anesthesia Plan  ASA: 3  Anesthesia Plan: General   Post-op Pain Management: Tylenol PO (pre-op)*   Induction: Intravenous  PONV Risk Score and Plan: 2 and Dexamethasone, Ondansetron and Treatment may vary due to age or medical condition  Airway Management Planned: Oral ETT and Video Laryngoscope Planned  Additional Equipment: None  Intra-op Plan:   Post-operative Plan: Extubation in OR  Informed Consent: I have reviewed the patients History and Physical, chart, labs and discussed the procedure including the risks, benefits and alternatives for the proposed anesthesia with the patient or authorized representative who has indicated his/her  understanding and acceptance.     Dental advisory given  Plan Discussed with: CRNA  Anesthesia Plan Comments: (PAT note by Karoline Caldwell, PA-C: [place] cardiology for history of HTN, RBBB, A-fib.  Echo 04/2019 showed normal LVEF, no wall motion abnormalities, normal valves.  Patient was admitted to the hospital with near syncope on 05/01/2019 with A-fib with RVR. Echocardiogram revealed normal LVEF. No wall motion abnormalities. He was discharged from ED on flecainide and propanolol as well as Xarelto for anticoagulation, he converted to sinus rhythm upon discharge. Patient had previously had A fib in Dec 2020 after back surgery and spontaneously converted to sinus. Anticoagulation wasdiscontinued by shared decision with patient and Dr. Einar Gip on 09/10/2019 as he had had no recurrence of atrial fibrillation. Flecainide was also discontinued as patient did not tolerate this due to severe fatigue.  Patient was last seen 07/29/2021 and noted to be doing well at that time.  No changes made to management.  Advised to follow-up in 1 year.  Follows with neurology for history of Parkinson's disease.  Last seen by Dr. Carles Collet on 09/27/2021.  At that time the patient was started on carbidopa levodopa due to worsening tremor.  He is also maintained on pramipexole.  It was discussed that he may be having upcoming lumbar surgery.  History of OSA, not on CPAP.  Preop labs reviewed, sodium mildly low at 134, otherwise unremarkable.  EKG 07/29/2021: Sinus rhythm.  Rate 65.  Right bundle branch block.  TTE 04/25/2019: 1. Left ventricular ejection fraction, by visual estimation, is 60 to  65%. The left ventricle has normal function. There is no left ventricular  hypertrophy.  2. The left ventricle has no regional wall motion abnormalities.  3. Global right ventricle has normal systolic function.The right  ventricular size is normal. No increase in right ventricular wall  thickness.  4. Left atrial size was normal.   5. Right atrial size was normal.  6. The mitral valve is normal in structure. No evidence of mitral valve  regurgitation. No evidence of mitral stenosis.  7. The tricuspid valve is normal in structure.  8. The aortic valve is normal in structure. Aortic valve regurgitation is  not visualized. No evidence of aortic valve sclerosis or stenosis.  9. The pulmonic valve was normal in structure. Pulmonic valve  regurgitation is not visualized.  10. The inferior vena cava is normal in size with greater than 50%  respiratory variability, suggesting right atrial pressure of 3 mmHg.   )      Anesthesia Quick Evaluation

## 2021-10-14 NOTE — Progress Notes (Signed)
Anesthesia Chart Review:  [place] cardiology for history of HTN, RBBB, A-fib.  Echo 04/2019 showed normal LVEF, no wall motion abnormalities, normal valves.  Patient was admitted to the hospital with near syncope on 05/01/2019 with A-fib with RVR. Echocardiogram revealed normal LVEF.  No wall motion abnormalities.   He was discharged from ED on flecainide and propanolol as well as Xarelto for anticoagulation, he converted to sinus rhythm upon discharge. Patient had previously had A fib in Dec 2020 after back surgery and spontaneously converted to sinus. Anticoagulation was discontinued by shared decision with patient and Dr. Einar Gip on 09/10/2019 as he had had no recurrence of atrial fibrillation.  Flecainide was also discontinued as patient did not tolerate this due to severe fatigue.  Patient was last seen 07/29/2021 and noted to be doing well at that time.  No changes made to management.  Advised to follow-up in 1 year.  Follows with neurology for history of Parkinson's disease.  Last seen by Dr. Carles Collet on 09/27/2021.  At that time the patient was started on carbidopa levodopa due to worsening tremor.  He is also maintained on pramipexole.  It was discussed that he may be having upcoming lumbar surgery.  History of OSA, not on CPAP.  Preop labs reviewed, sodium mildly low at 134, otherwise unremarkable.  EKG 07/29/2021: Sinus rhythm.  Rate 65.  Right bundle branch block.  TTE 04/25/2019: 1. Left ventricular ejection fraction, by visual estimation, is 60 to  65%. The left ventricle has normal function. There is no left ventricular  hypertrophy.   2. The left ventricle has no regional wall motion abnormalities.   3. Global right ventricle has normal systolic function.The right  ventricular size is normal. No increase in right ventricular wall  thickness.   4. Left atrial size was normal.   5. Right atrial size was normal.   6. The mitral valve is normal in structure. No evidence of mitral valve   regurgitation. No evidence of mitral stenosis.   7. The tricuspid valve is normal in structure.   8. The aortic valve is normal in structure. Aortic valve regurgitation is  not visualized. No evidence of aortic valve sclerosis or stenosis.   9. The pulmonic valve was normal in structure. Pulmonic valve  regurgitation is not visualized.  10. The inferior vena cava is normal in size with greater than 50%  respiratory variability, suggesting right atrial pressure of 3 mmHg.     Wynonia Musty [facility]/Anesthesiology Phone [phone] 10/14/2021 1:51 PM

## 2021-10-17 ENCOUNTER — Other Ambulatory Visit: Payer: Self-pay | Admitting: Cardiology

## 2021-10-17 MED ORDER — PROPRANOLOL HCL 20 MG PO TABS: 20.0000 mg | ORAL_TABLET | Freq: Two times a day (BID) | ORAL | 3 refills | Status: DC

## 2021-10-20 ENCOUNTER — Other Ambulatory Visit: Payer: Self-pay

## 2021-10-20 ENCOUNTER — Encounter (HOSPITAL_COMMUNITY)
Admission: RE | Disposition: A | Discharge status: Home / Self Care | Payer: Self-pay | Source: Home / Self Care | Attending: Neurosurgery

## 2021-10-20 ENCOUNTER — Ambulatory Visit (HOSPITAL_COMMUNITY): Payer: PPO

## 2021-10-20 ENCOUNTER — Ambulatory Visit (HOSPITAL_BASED_OUTPATIENT_CLINIC_OR_DEPARTMENT_OTHER): Payer: PPO | Admitting: Anesthesiology

## 2021-10-20 ENCOUNTER — Ambulatory Visit (HOSPITAL_COMMUNITY): Payer: PPO | Admitting: Physician Assistant

## 2021-10-20 ENCOUNTER — Ambulatory Visit (HOSPITAL_COMMUNITY)
Admission: RE | Admit: 2021-10-20 | Discharge: 2021-10-21 | Disposition: A | Discharge status: Home / Self Care | Payer: PPO | Attending: Neurosurgery | Admitting: Neurosurgery

## 2021-10-20 DIAGNOSIS — G20 Parkinson's disease: Secondary | ICD-10-CM | POA: Diagnosis not present

## 2021-10-20 DIAGNOSIS — M48062 Spinal stenosis, lumbar region with neurogenic claudication: Secondary | ICD-10-CM | POA: Diagnosis not present

## 2021-10-20 DIAGNOSIS — I10 Essential (primary) hypertension: Secondary | ICD-10-CM | POA: Diagnosis not present

## 2021-10-20 DIAGNOSIS — K219 Gastro-esophageal reflux disease without esophagitis: Secondary | ICD-10-CM | POA: Diagnosis not present

## 2021-10-20 DIAGNOSIS — M4316 Spondylolisthesis, lumbar region: Secondary | ICD-10-CM

## 2021-10-20 DIAGNOSIS — M47815 Spondylosis without myelopathy or radiculopathy, thoracolumbar region: Secondary | ICD-10-CM | POA: Diagnosis not present

## 2021-10-20 DIAGNOSIS — I4510 Unspecified right bundle-branch block: Secondary | ICD-10-CM | POA: Insufficient documentation

## 2021-10-20 DIAGNOSIS — M5116 Intervertebral disc disorders with radiculopathy, lumbar region: Secondary | ICD-10-CM

## 2021-10-20 DIAGNOSIS — Z981 Arthrodesis status: Secondary | ICD-10-CM | POA: Diagnosis not present

## 2021-10-20 DIAGNOSIS — I4891 Unspecified atrial fibrillation: Secondary | ICD-10-CM | POA: Insufficient documentation

## 2021-10-20 DIAGNOSIS — M4326 Fusion of spine, lumbar region: Secondary | ICD-10-CM | POA: Diagnosis not present

## 2021-10-20 SURGERY — POSTERIOR LUMBAR FUSION 1 LEVEL
Anesthesia: General | Site: Back

## 2021-10-20 MED ORDER — ACETAMINOPHEN 500 MG PO TABS: 1000.0000 mg | ORAL_TABLET | Freq: Once | ORAL | Status: DC

## 2021-10-20 MED ORDER — EPHEDRINE 5 MG/ML INJ
INTRAVENOUS | Status: AC
  Filled 2021-10-20: qty 5

## 2021-10-20 MED ORDER — FLUTICASONE PROPIONATE 50 MCG/ACT NA SUSP
2.0000 | Freq: Every day | NASAL | Status: DC
  Filled 2021-10-20: qty 16

## 2021-10-20 MED ORDER — PHENYLEPHRINE 80 MCG/ML (10ML) SYRINGE FOR IV PUSH (FOR BLOOD PRESSURE SUPPORT)
PREFILLED_SYRINGE | INTRAVENOUS | Status: DC | PRN
  Administered 2021-10-20: 160 ug via INTRAVENOUS
  Administered 2021-10-20 (×2): 80 ug via INTRAVENOUS

## 2021-10-20 MED ORDER — POTASSIUM CHLORIDE ER 10 MEQ PO TBCR
10.0000 meq | EXTENDED_RELEASE_TABLET | Freq: Two times a day (BID) | ORAL | Status: DC
  Administered 2021-10-20: 10 meq via ORAL
  Filled 2021-10-20 (×3): qty 1

## 2021-10-20 MED ORDER — SODIUM CHLORIDE 0.9% FLUSH: 3.0000 mL | INTRAVENOUS | Status: DC | PRN

## 2021-10-20 MED ORDER — DEXAMETHASONE SODIUM PHOSPHATE 10 MG/ML IJ SOLN
INTRAMUSCULAR | Status: DC | PRN
  Administered 2021-10-20: 10 mg via INTRAVENOUS

## 2021-10-20 MED ORDER — BUPIVACAINE LIPOSOME 1.3 % IJ SUSP
INTRAMUSCULAR | Status: DC | PRN
  Administered 2021-10-20: 20 mL

## 2021-10-20 MED ORDER — CEFAZOLIN SODIUM-DEXTROSE 2-4 GM/100ML-% IV SOLN
2.0000 g | Freq: Three times a day (TID) | INTRAVENOUS | Status: AC
  Administered 2021-10-20 – 2021-10-21 (×2): 2 g via INTRAVENOUS
  Filled 2021-10-20 (×2): qty 100

## 2021-10-20 MED ORDER — EPHEDRINE SULFATE-NACL 50-0.9 MG/10ML-% IV SOSY
PREFILLED_SYRINGE | INTRAVENOUS | Status: DC | PRN
  Administered 2021-10-20 (×2): 5 mg via INTRAVENOUS

## 2021-10-20 MED ORDER — ACETAMINOPHEN 500 MG PO TABS
1000.0000 mg | ORAL_TABLET | Freq: Four times a day (QID) | ORAL | Status: DC
  Administered 2021-10-21 (×2): 1000 mg via ORAL
  Filled 2021-10-20 (×3): qty 2

## 2021-10-20 MED ORDER — HYDROMORPHONE HCL 1 MG/ML IJ SOLN
0.5000 mg | INTRAMUSCULAR | Status: DC | PRN
  Administered 2021-10-20: 1 mg via INTRAVENOUS
  Filled 2021-10-20: qty 1

## 2021-10-20 MED ORDER — CEFAZOLIN SODIUM-DEXTROSE 2-4 GM/100ML-% IV SOLN
INTRAVENOUS | Status: AC
  Filled 2021-10-20: qty 100

## 2021-10-20 MED ORDER — MENTHOL 3 MG MT LOZG: 1.0000 | LOZENGE | OROMUCOSAL | Status: DC | PRN

## 2021-10-20 MED ORDER — HYDROMORPHONE HCL 1 MG/ML IJ SOLN
INTRAMUSCULAR | Status: AC
  Filled 2021-10-20: qty 1

## 2021-10-20 MED ORDER — TRAMADOL HCL 50 MG PO TABS
50.0000 mg | ORAL_TABLET | Freq: Four times a day (QID) | ORAL | Status: DC | PRN
  Administered 2021-10-20: 50 mg via ORAL
  Administered 2021-10-21: 100 mg via ORAL
  Filled 2021-10-20: qty 2
  Filled 2021-10-20: qty 1

## 2021-10-20 MED ORDER — AMISULPRIDE (ANTIEMETIC) 5 MG/2ML IV SOLN: 10.0000 mg | Freq: Once | INTRAVENOUS | Status: DC | PRN

## 2021-10-20 MED ORDER — BUPIVACAINE-EPINEPHRINE 0.5% -1:200000 IJ SOLN
INTRAMUSCULAR | Status: AC
  Filled 2021-10-20: qty 1

## 2021-10-20 MED ORDER — ONDANSETRON HCL 4 MG/2ML IJ SOLN
INTRAMUSCULAR | Status: AC
  Filled 2021-10-20: qty 2

## 2021-10-20 MED ORDER — BUPIVACAINE LIPOSOME 1.3 % IJ SUSP
INTRAMUSCULAR | Status: AC
  Filled 2021-10-20: qty 20

## 2021-10-20 MED ORDER — LIDOCAINE 2% (20 MG/ML) 5 ML SYRINGE
INTRAMUSCULAR | Status: AC
  Filled 2021-10-20: qty 5

## 2021-10-20 MED ORDER — 0.9 % SODIUM CHLORIDE (POUR BTL) OPTIME
TOPICAL | Status: DC | PRN
  Administered 2021-10-20: 1000 mL

## 2021-10-20 MED ORDER — LACTATED RINGERS IV SOLN: INTRAVENOUS | Status: DC

## 2021-10-20 MED ORDER — SUGAMMADEX SODIUM 200 MG/2ML IV SOLN
INTRAVENOUS | Status: DC | PRN
  Administered 2021-10-20: 200 mg via INTRAVENOUS

## 2021-10-20 MED ORDER — LIDOCAINE 2% (20 MG/ML) 5 ML SYRINGE
INTRAMUSCULAR | Status: DC | PRN
  Administered 2021-10-20: 60 mg via INTRAVENOUS

## 2021-10-20 MED ORDER — PROPOFOL 10 MG/ML IV BOLUS
INTRAVENOUS | Status: AC
  Filled 2021-10-20: qty 20

## 2021-10-20 MED ORDER — ROCURONIUM BROMIDE 10 MG/ML (PF) SYRINGE
PREFILLED_SYRINGE | INTRAVENOUS | Status: DC | PRN
  Administered 2021-10-20: 60 mg via INTRAVENOUS
  Administered 2021-10-20: 20 mg via INTRAVENOUS
  Administered 2021-10-20: 40 mg via INTRAVENOUS
  Administered 2021-10-20: 20 mg via INTRAVENOUS

## 2021-10-20 MED ORDER — PHENOL 1.4 % MT LIQD: 1.0000 | OROMUCOSAL | Status: DC | PRN

## 2021-10-20 MED ORDER — ACETAMINOPHEN 325 MG PO TABS: 650.0000 mg | ORAL_TABLET | ORAL | Status: DC | PRN

## 2021-10-20 MED ORDER — ORAL CARE MOUTH RINSE: 15.0000 mL | Freq: Once | OROMUCOSAL | Status: AC

## 2021-10-20 MED ORDER — CHLORHEXIDINE GLUCONATE CLOTH 2 % EX PADS: 6.0000 | MEDICATED_PAD | Freq: Once | CUTANEOUS | Status: DC

## 2021-10-20 MED ORDER — DILTIAZEM HCL ER COATED BEADS 180 MG PO CP24
180.0000 mg | ORAL_CAPSULE | Freq: Every evening | ORAL | Status: DC
  Administered 2021-10-20: 180 mg via ORAL
  Filled 2021-10-20: qty 1

## 2021-10-20 MED ORDER — OXYCODONE HCL 5 MG PO TABS: 10.0000 mg | ORAL_TABLET | ORAL | Status: DC | PRN

## 2021-10-20 MED ORDER — FENTANYL CITRATE (PF) 250 MCG/5ML IJ SOLN
INTRAMUSCULAR | Status: DC | PRN
Start: 2021-10-20 — End: 2021-10-20
  Administered 2021-10-20 (×5): 50 ug via INTRAVENOUS

## 2021-10-20 MED ORDER — OXYCODONE HCL 5 MG/5ML PO SOLN
ORAL | Status: AC
  Filled 2021-10-20: qty 5

## 2021-10-20 MED ORDER — CHLORHEXIDINE GLUCONATE 0.12 % MT SOLN: 15.0000 mL | Freq: Once | OROMUCOSAL | Status: AC

## 2021-10-20 MED ORDER — CARBIDOPA-LEVODOPA 25-100 MG PO TABS
1.0000 | ORAL_TABLET | Freq: Three times a day (TID) | ORAL | Status: DC
Start: 2021-10-20 — End: 2021-10-21
  Administered 2021-10-20 – 2021-10-21 (×2): 1 via ORAL
  Filled 2021-10-20 (×2): qty 1

## 2021-10-20 MED ORDER — ACETAMINOPHEN 650 MG RE SUPP: 650.0000 mg | RECTAL | Status: DC | PRN

## 2021-10-20 MED ORDER — ACETAMINOPHEN 10 MG/ML IV SOLN
INTRAVENOUS | Status: AC
  Filled 2021-10-20: qty 100

## 2021-10-20 MED ORDER — MIRTAZAPINE 15 MG PO TABS: 15.0000 mg | ORAL_TABLET | Freq: Every evening | ORAL | Status: DC | PRN

## 2021-10-20 MED ORDER — ACETAMINOPHEN 10 MG/ML IV SOLN
1000.0000 mg | Freq: Once | INTRAVENOUS | Status: AC
Start: 2021-10-20 — End: 2021-10-20
  Administered 2021-10-20: 1000 mg via INTRAVENOUS

## 2021-10-20 MED ORDER — THROMBIN 5000 UNITS EX SOLR: OROMUCOSAL | Status: DC | PRN

## 2021-10-20 MED ORDER — BACITRACIN ZINC 500 UNIT/GM EX OINT
TOPICAL_OINTMENT | CUTANEOUS | Status: AC
  Filled 2021-10-20: qty 28.35

## 2021-10-20 MED ORDER — BUPIVACAINE-EPINEPHRINE (PF) 0.5% -1:200000 IJ SOLN
INTRAMUSCULAR | Status: DC | PRN
  Administered 2021-10-20: 10 mL via PERINEURAL

## 2021-10-20 MED ORDER — FENTANYL CITRATE (PF) 250 MCG/5ML IJ SOLN
INTRAMUSCULAR | Status: AC
  Filled 2021-10-20: qty 5

## 2021-10-20 MED ORDER — CEFAZOLIN SODIUM-DEXTROSE 2-4 GM/100ML-% IV SOLN
2.0000 g | INTRAVENOUS | Status: AC
  Administered 2021-10-20: 2 g via INTRAVENOUS

## 2021-10-20 MED ORDER — HYDROMORPHONE HCL 1 MG/ML IJ SOLN
0.2500 mg | INTRAMUSCULAR | Status: DC | PRN
  Administered 2021-10-20: 0.5 mg via INTRAVENOUS
  Administered 2021-10-20 (×2): 0.25 mg via INTRAVENOUS

## 2021-10-20 MED ORDER — SODIUM CHLORIDE 0.9 % IV SOLN: 250.0000 mL | INTRAVENOUS | Status: DC

## 2021-10-20 MED ORDER — ONDANSETRON HCL 4 MG/2ML IJ SOLN
INTRAMUSCULAR | Status: DC | PRN
  Administered 2021-10-20: 4 mg via INTRAVENOUS

## 2021-10-20 MED ORDER — ROCURONIUM BROMIDE 10 MG/ML (PF) SYRINGE
PREFILLED_SYRINGE | INTRAVENOUS | Status: AC
  Filled 2021-10-20: qty 10

## 2021-10-20 MED ORDER — DOCUSATE SODIUM 100 MG PO CAPS
100.0000 mg | ORAL_CAPSULE | Freq: Two times a day (BID) | ORAL | Status: DC
  Administered 2021-10-20: 100 mg via ORAL
  Filled 2021-10-20: qty 1

## 2021-10-20 MED ORDER — THROMBIN 5000 UNITS EX SOLR
CUTANEOUS | Status: AC
  Filled 2021-10-20: qty 5000

## 2021-10-20 MED ORDER — ONDANSETRON HCL 4 MG/2ML IJ SOLN: 4.0000 mg | Freq: Four times a day (QID) | INTRAMUSCULAR | Status: DC | PRN

## 2021-10-20 MED ORDER — TIZANIDINE HCL 4 MG PO TABS
4.0000 mg | ORAL_TABLET | Freq: Four times a day (QID) | ORAL | Status: DC | PRN
  Administered 2021-10-20 – 2021-10-21 (×2): 4 mg via ORAL
  Filled 2021-10-20 (×2): qty 1

## 2021-10-20 MED ORDER — DEXAMETHASONE SODIUM PHOSPHATE 10 MG/ML IJ SOLN
INTRAMUSCULAR | Status: AC
  Filled 2021-10-20: qty 1

## 2021-10-20 MED ORDER — OXYCODONE HCL 5 MG PO TABS: 5.0000 mg | ORAL_TABLET | ORAL | Status: DC | PRN

## 2021-10-20 MED ORDER — FENTANYL CITRATE (PF) 100 MCG/2ML IJ SOLN
INTRAMUSCULAR | Status: AC
  Filled 2021-10-20: qty 2

## 2021-10-20 MED ORDER — OXYCODONE HCL 5 MG/5ML PO SOLN
5.0000 mg | Freq: Once | ORAL | Status: AC
  Administered 2021-10-20: 5 mg via ORAL

## 2021-10-20 MED ORDER — PANTOPRAZOLE SODIUM 40 MG PO TBEC
40.0000 mg | DELAYED_RELEASE_TABLET | Freq: Every day | ORAL | Status: DC
  Administered 2021-10-20: 40 mg via ORAL
  Filled 2021-10-20: qty 1

## 2021-10-20 MED ORDER — PROPOFOL 10 MG/ML IV BOLUS
INTRAVENOUS | Status: DC | PRN
  Administered 2021-10-20: 150 mg via INTRAVENOUS

## 2021-10-20 MED ORDER — ONDANSETRON HCL 4 MG PO TABS
4.0000 mg | ORAL_TABLET | Freq: Four times a day (QID) | ORAL | Status: DC | PRN
Start: 2021-10-20 — End: 2021-10-21

## 2021-10-20 MED ORDER — PHENYLEPHRINE 80 MCG/ML (10ML) SYRINGE FOR IV PUSH (FOR BLOOD PRESSURE SUPPORT)
PREFILLED_SYRINGE | INTRAVENOUS | Status: AC
Start: 2021-10-20 — End: ?
  Filled 2021-10-20: qty 10

## 2021-10-20 MED ORDER — CHLORHEXIDINE GLUCONATE 0.12 % MT SOLN
OROMUCOSAL | Status: AC
  Administered 2021-10-20: 15 mL via OROMUCOSAL
  Filled 2021-10-20: qty 15

## 2021-10-20 MED ORDER — FENTANYL CITRATE (PF) 100 MCG/2ML IJ SOLN
25.0000 ug | INTRAMUSCULAR | Status: DC | PRN
  Administered 2021-10-20 (×3): 50 ug via INTRAVENOUS

## 2021-10-20 MED ORDER — DIPHENHYDRAMINE HCL 25 MG PO CAPS
25.0000 mg | ORAL_CAPSULE | Freq: Four times a day (QID) | ORAL | Status: DC | PRN
  Administered 2021-10-20: 25 mg via ORAL
  Filled 2021-10-20: qty 1

## 2021-10-20 MED ORDER — BACITRACIN ZINC 500 UNIT/GM EX OINT
TOPICAL_OINTMENT | CUTANEOUS | Status: DC | PRN
  Administered 2021-10-20: 1 via TOPICAL

## 2021-10-20 MED ORDER — PROPRANOLOL HCL 20 MG PO TABS
20.0000 mg | ORAL_TABLET | Freq: Two times a day (BID) | ORAL | Status: DC
  Administered 2021-10-20: 20 mg via ORAL
  Filled 2021-10-20 (×2): qty 1

## 2021-10-20 MED ORDER — PRAMIPEXOLE DIHYDROCHLORIDE 0.25 MG PO TABS
0.5000 mg | ORAL_TABLET | Freq: Three times a day (TID) | ORAL | Status: DC
  Administered 2021-10-20: 0.5 mg via ORAL
  Filled 2021-10-20: qty 2

## 2021-10-20 MED ORDER — BISACODYL 10 MG RE SUPP: 10.0000 mg | Freq: Every day | RECTAL | Status: DC | PRN

## 2021-10-20 MED ORDER — SODIUM CHLORIDE 0.9% FLUSH
3.0000 mL | Freq: Two times a day (BID) | INTRAVENOUS | Status: DC
  Administered 2021-10-20: 3 mL via INTRAVENOUS

## 2021-10-20 SURGICAL SUPPLY — 65 items
BAG COUNTER SPONGE SURGICOUNT (BAG) ×2 IMPLANT
BASKET BONE COLLECTION (BASKET) ×2 IMPLANT
BENZOIN TINCTURE PRP APPL 2/3 (GAUZE/BANDAGES/DRESSINGS) ×2 IMPLANT
BLADE CLIPPER SURG (BLADE) IMPLANT
BUR MATCHSTICK NEURO 3.0 LAGG (BURR) ×2 IMPLANT
BUR PRECISION FLUTE 6.0 (BURR) ×2 IMPLANT
CANISTER SUCT 3000ML PPV (MISCELLANEOUS) ×2 IMPLANT
CAP LOCK DLX THRD (Cap) ×4 IMPLANT
CARTRIDGE OIL MAESTRO DRILL (MISCELLANEOUS) ×1 IMPLANT
CNTNR URN SCR LID CUP LEK RST (MISCELLANEOUS) ×1 IMPLANT
CONT SPEC 4OZ STRL OR WHT (MISCELLANEOUS) ×2
COVER BACK TABLE 60X90IN (DRAPES) ×2 IMPLANT
DIFFUSER DRILL AIR PNEUMATIC (MISCELLANEOUS) ×2 IMPLANT
DRAPE C-ARM 42X72 X-RAY (DRAPES) ×4 IMPLANT
DRAPE HALF SHEET 40X57 (DRAPES) ×2 IMPLANT
DRAPE LAPAROTOMY 100X72X124 (DRAPES) ×2 IMPLANT
DRAPE SURG 17X23 STRL (DRAPES) ×8 IMPLANT
DRSG OPSITE POSTOP 4X6 (GAUZE/BANDAGES/DRESSINGS) ×2 IMPLANT
ELECT BLADE 4.0 EZ CLEAN MEGAD (MISCELLANEOUS) ×2
ELECT REM PT RETURN 9FT ADLT (ELECTROSURGICAL) ×2
ELECTRODE BLDE 4.0 EZ CLN MEGD (MISCELLANEOUS) ×1 IMPLANT
ELECTRODE REM PT RTRN 9FT ADLT (ELECTROSURGICAL) ×1 IMPLANT
EVACUATOR 1/8 PVC DRAIN (DRAIN) IMPLANT
GAUZE 4X4 16PLY ~~LOC~~+RFID DBL (SPONGE) ×2 IMPLANT
GLOVE BIO SURGEON STRL SZ 6 (GLOVE) ×2 IMPLANT
GLOVE BIO SURGEON STRL SZ8 (GLOVE) ×4 IMPLANT
GLOVE BIO SURGEON STRL SZ8.5 (GLOVE) ×4 IMPLANT
GLOVE BIOGEL PI IND STRL 6.5 (GLOVE) ×1 IMPLANT
GLOVE BIOGEL PI INDICATOR 6.5 (GLOVE) ×1
GLOVE EXAM NITRILE XL STR (GLOVE) IMPLANT
GOWN STRL REUS W/ TWL LRG LVL3 (GOWN DISPOSABLE) ×1 IMPLANT
GOWN STRL REUS W/ TWL XL LVL3 (GOWN DISPOSABLE) ×2 IMPLANT
GOWN STRL REUS W/TWL 2XL LVL3 (GOWN DISPOSABLE) IMPLANT
GOWN STRL REUS W/TWL LRG LVL3 (GOWN DISPOSABLE) ×2
GOWN STRL REUS W/TWL XL LVL3 (GOWN DISPOSABLE) ×4
HEMOSTAT POWDER KIT SURGIFOAM (HEMOSTASIS) ×2 IMPLANT
KIT BASIN OR (CUSTOM PROCEDURE TRAY) ×2 IMPLANT
KIT GRAFTMAG DEL NEURO DISP (NEUROSURGERY SUPPLIES) ×1 IMPLANT
KIT TURNOVER KIT B (KITS) ×2 IMPLANT
NDL HYPO 21X1.5 SAFETY (NEEDLE) IMPLANT
NEEDLE HYPO 21X1.5 SAFETY (NEEDLE) IMPLANT
NEEDLE HYPO 22GX1.5 SAFETY (NEEDLE) ×2 IMPLANT
NS IRRIG 1000ML POUR BTL (IV SOLUTION) ×2 IMPLANT
OIL CARTRIDGE MAESTRO DRILL (MISCELLANEOUS) ×2
PACK LAMINECTOMY NEURO (CUSTOM PROCEDURE TRAY) ×2 IMPLANT
PAD ARMBOARD 7.5X6 YLW CONV (MISCELLANEOUS) ×6 IMPLANT
PATTIES SURGICAL .5 X1 (DISPOSABLE) IMPLANT
PUTTY DBM 5CC CALC GRAN (Putty) ×2 IMPLANT
ROD CURVED TI 6.35X45 (Rod) ×2 IMPLANT
SCREW PA DLX CREO 7.5X55 (Screw) ×4 IMPLANT
SPACER ALTERA 10X31 8-12MM-8 (Spacer) ×1 IMPLANT
SPIKE FLUID TRANSFER (MISCELLANEOUS) ×2 IMPLANT
SPONGE NEURO XRAY DETECT 1X3 (DISPOSABLE) IMPLANT
SPONGE SURGIFOAM ABS GEL 100 (HEMOSTASIS) IMPLANT
SPONGE T-LAP 4X18 ~~LOC~~+RFID (SPONGE) IMPLANT
STRIP CLOSURE SKIN 1/2X4 (GAUZE/BANDAGES/DRESSINGS) ×2 IMPLANT
SUT VIC AB 1 CT1 18XBRD ANBCTR (SUTURE) ×2 IMPLANT
SUT VIC AB 1 CT1 8-18 (SUTURE) ×4
SUT VIC AB 2-0 CP2 18 (SUTURE) ×4 IMPLANT
SYR 20ML LL LF (SYRINGE) IMPLANT
TAP SURG GLBU 6.5X40 (ORTHOPEDIC DISPOSABLE SUPPLIES) ×1 IMPLANT
TOWEL GREEN STERILE (TOWEL DISPOSABLE) ×2 IMPLANT
TOWEL GREEN STERILE FF (TOWEL DISPOSABLE) ×2 IMPLANT
TRAY FOLEY MTR SLVR 16FR STAT (SET/KITS/TRAYS/PACK) ×2 IMPLANT
WATER STERILE IRR 1000ML POUR (IV SOLUTION) ×2 IMPLANT

## 2021-10-20 NOTE — Transfer of Care (Signed)
Immediate Anesthesia Transfer of Care Note  Patient: [name]  Procedure(s) Performed: PLIF,IP,POSTERIOR INSTRUMENTATION L23 (Back)  Patient Location: PACU  Anesthesia Type:General  Level of Consciousness: awake and alert   Airway & Oxygen Therapy: Patient Spontanous Breathing and Patient connected to nasal cannula oxygen  Post-op Assessment: Report given to RN, Post -op Vital signs reviewed and stable and Patient moving all extremities X 4  Post vital signs: Reviewed and stable  Last Vitals:  Vitals Value Taken Time  BP 120/70 10/20/21 1519  Temp    Pulse 84 10/20/21 1522  Resp 19 10/20/21 1522  SpO2 93 % 10/20/21 1522  Vitals shown include unvalidated device data.  Last Pain:  Vitals:   10/20/21 1006  TempSrc:   PainSc: 0-No pain         Complications: No notable events documented.

## 2021-10-20 NOTE — Op Note (Signed)
Brief history: The patient is a 76 year old white male on whom I previously performed a lumbar fusion.  He has done well until recently when he developed increasing back and leg pain.  He has failed medical management.  He was worked up with a lumbar myelo CT which demonstrated spondylolisthesis at L2-3 with spinal stenosis.  I discussed the various treatment options with him.  He has decided proceed with surgery.  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 Etc. requiring a wide decompression of the nerve roots.);  L2-3 transforaminal lumbar interbody fusion with local morselized autograft bone and Zimmer DBM; insertion of interbody prosthesis at L2-3 (globus peek expandable interbody prosthesis); posterior nonsegmental instrumentation from L2 to L3 with globus titanium pedicle screws and rods; posterior lateral arthrodesis at L2-3 with local morselized autograft bone and Zimmer DBM.  Surgeon: Dr. Earle Gell  Asst.: None   Anesthesia: Gen. endotracheal  Estimated blood loss: 200 cc  Drains: None  Complications: None  Description of procedure: The patient was brought to the operating room by the anesthesia team. General endotracheal anesthesia was induced. The patient was turned to the prone position on the Wilson frame. The patient's lumbosacral region was then prepared with Betadine scrub and Betadine solution. Sterile drapes were applied.  I then injected the area to be incised with Marcaine with epinephrine solution. I  then used the scalpel to make a linear midline incision over the L2-3 interspace. I then used electrocautery to perform a bilateral subperiosteal dissection exposing the spinous process and lamina of L2 and L3. We then obtained intraoperative radiograph to confirm our location. We then inserted the Verstrac retractor to provide exposure.  I began the decompression by using the high speed drill to perform laminotomies at L2-3 bilaterally. We then used the Kerrison punches to widen the laminotomy and removed the ligamentum flavum at L2-3 bilaterally. We used the Kerrison punches to remove the medial facets at L2-3 bilaterally, we removed the right L2-3 facet. We performed wide foraminotomies about the bilateral L2 and L3 nerve roots completing the decompression.  We now turned our attention to the posterior lumbar interbody fusion. I used a scalpel to incise the intervertebral disc at L2-3 bilaterally. I then performed a partial intervertebral discectomy at L2-3 bilaterally using the pituitary forceps. We prepared the vertebral endplates at T6-5 bilaterally for the fusion by removing the soft tissues with the curettes. We then used the trial spacers to pick the appropriate sized interbody prosthesis. We prefilled his prosthesis with a combination of local morselized autograft bone that we obtained during the decompression as well as Zimmer DBM. We inserted the prefilled prosthesis into the interspace at L2-3 from the right, we then turned and expanded the prosthesis. There was a good snug fit of the prosthesis in the interspace. We then filled and the remainder of the intervertebral disc space with local morselized autograft bone and Zimmer DBM. This completed the posterior lumbar interbody arthrodesis.  During the decompression and insertion of the prosthesis the assistant protected the thecal sac and nerve roots with the D'Errico retractor.  We now turned attention to the instrumentation. Under fluoroscopic  guidance we cannulated the bilateral L2 and L3 pedicles with the bone probe. We then removed the bone probe. We then tapped the pedicle with a 6.5 millimeter tap. We then removed the tap. We probed inside the tapped pedicle with a ball probe to rule out cortical breaches. We then inserted a 7.5 x 55 millimeter pedicle screw into the L2 and L3 pedicles bilaterally under fluoroscopic guidance. We then palpated along the medial aspect of the pedicles to rule out cortical breaches. There were none. The nerve roots were not injured. We then connected the unilateral pedicle screws with a lordotic rod. We compressed the construct and secured the rod in place with the caps. We then tightened the caps appropriately. This completed the instrumentation from L2-3 bilaterally.  We now turned our attention to the posterior lateral arthrodesis at L2-3. We used the high-speed drill to decorticate the remainder of the facets, pars, transverse process at L2-3. We then applied a combination of local morselized autograft bone and Zimmer DBM over these decorticated posterior lateral structures. This completed the posterior lateral arthrodesis.  We then obtained hemostasis using bipolar electrocautery. We irrigated the wound out with bacitracin solution. We inspected the thecal sac and nerve roots and noted they were well decompressed. We then removed the retractor.  We injected Exparel . We reapproximated patient's thoracolumbar fascia with interrupted #1 Vicryl suture. We reapproximated patient's subcutaneous tissue with interrupted 2-0 Vicryl suture. The reapproximated patient's skin with Steri-Strips and benzoin. The wound was then coated with bacitracin ointment. A sterile dressing was applied. The drapes were removed. The patient was subsequently returned to the supine position where they were extubated by the anesthesia team. He was then transported to the post anesthesia care unit in stable condition. All sponge instrument  and needle counts were reportedly correct at the end of this case.

## 2021-10-20 NOTE — Anesthesia Procedure Notes (Signed)
Procedure Name: Intubation Date/Time: 10/20/2021 11:49 AM  Performed by: Erick Colace, CRNAPre-anesthesia Checklist: Patient identified, Emergency Drugs available, Suction available and Patient being monitored Patient Re-evaluated:Patient Re-evaluated prior to induction Oxygen Delivery Method: Circle system utilized Preoxygenation: Pre-oxygenation with 100% oxygen Induction Type: IV induction Ventilation: Mask ventilation without difficulty Laryngoscope Size: Glidescope and 4 Grade View: Grade I Tube type: Oral Tube size: 7.5 mm Number of attempts: 1 Airway Equipment and Method: Stylet and Oral airway Placement Confirmation: ETT inserted through vocal cords under direct vision, positive ETCO2 and breath sounds checked- equal and bilateral Secured at: 23 cm Tube secured with: Tape Dental Injury: Teeth and Oropharynx as per pre-operative assessment  Comments: Head and neck in neutral position throughout induction and intubation. Elective glidescope due to history of previous neck fusion surgery.

## 2021-10-20 NOTE — Progress Notes (Signed)
Orthopedic Tech Progress Note Patient Details:  [name] 09/23/1945 [id number]  Ortho Devices Type of Ortho Device: Lumbar corsett Ortho Device/Splint Interventions: Ordered      Danton Sewer A Fransico Sciandra 10/20/2021, 4:05 PM

## 2021-10-20 NOTE — H&P (Signed)
Subjective: The patient is a 76 year old white male on whom I performed lumbar fusions.  He has done well with his until recently when he developed recurrent back and leg pain.  He has failed medical management.  He was worked up with a lumbar myelo CT which demonstrated adjacent segment disease, spondylolisthesis, stenosis, etc.  I discussed the various treatment options with him.  He has decided proceed with surgery.  Past Medical History:  Diagnosis Date   [place]fib ([place])    Arthritis    GERD (gastroesophageal reflux disease)    H/O urinary frequency    Heart murmur    as a child   History of bronchitis    HSV infection    oral   Hypertension    improved after back pain treated with surgery   Insomnia    Insomnia    Improved with Ambien, did not tolerate melatonin.   Joint pain    Parkinson disease ([place])    Sleep apnea    no CPAP   Vertigo 10/13/2014   recurrent; trigger is cool air on left ear, improved with daily antihistamine    Past Surgical History:  Procedure Laterality Date   ACHILLES TENDON REPAIR Left    ALLOGRAFT APPLICATION Left 34/28/7681   Procedure: APPLICATION OF  INTEGRA;  Surgeon: Irene Limbo, MD;  Location: [facility];  Service: Plastics;  Laterality: Left;   BACK SURGERY     COLONOSCOPY  02/2011   Negative,[facility]   DEBRIDEMENT AND CLOSURE WOUND Left 02/25/2018   Procedure: DEBRIDEMENT LEFT LEG APPLICATION INTEGRA;  Surgeon: Irene Limbo, MD;  Location: [facility];  Service: Plastics;  Laterality: Left;   EYE SURGERY Bilateral    cataracts   INCISION AND DRAINAGE OF WOUND Left 11/12/2020   Procedure: DEBRIDEMENT LLE ULCER;  Surgeon: Irene Limbo, MD;  Location: [facility];  Service: Plastics;  Laterality: Left;   KNEE ARTHROSCOPY     L   LUMBAR FUSION     2016   LUMBAR SPINE SURGERY  03/31/2019   exploration of lumbar fusion w/L5-S1 decompression redo L4-5 fusion   SPINE SURGERY  2003    cervical fusion   TONSILLECTOMY     varicoelectomy      Allergies  Allergen Reactions   Flexeril [Cyclobenzaprine] Other (See Comments)    Constipation   Hctz [Hydrochlorothiazide]     hyponatremia   Morphine And Related Hives    Hallucinations- can tolerate oxycodone.    Phenergan [Promethazine] Other (See Comments)    Would avoid due to Parkinsons   Sinemet [Carbidopa-Levodopa]     intolerant   Tambocor [Flecainide] Nausea And Vomiting    Social History   Tobacco Use   Smoking status: Never   Smokeless tobacco: Never  Substance Use Topics   Alcohol use: No    Family History  Problem Relation Age of Onset   Hypertension Mother    Asthma Mother    Diabetes Father    Stroke Father    Heart attack Paternal Grandfather        >55   Cancer Sister    Colon cancer Neg Hx    Prostate cancer Neg Hx    Prior to Admission medications   Medication Sig Start Date End Date Taking? Authorizing Provider  acetaminophen (TYLENOL) 650 MG CR tablet Take 1,300 mg by mouth daily as needed for pain.   Yes [provider]  carbidopa-levodopa (SINEMET IR) 25-100 MG tablet Take 1 tablet by  mouth 3 (three) times daily. 8am/noon/4pm 09/27/21  Yes Tat, Eustace Quail, DO  diclofenac Sodium (VOLTAREN) 1 % GEL Apply 1 Application topically 4 (four) times daily as needed (pain.).   Yes [provider]  diltiazem (CARDIZEM CD) 180 MG 24 hr capsule TAKE 1 CAPSULE BY MOUTH EVERY DAY Patient taking differently: Take 180 mg by mouth every evening. 09/19/21  Yes Adrian Prows, MD  fluticasone (FLONASE) 50 MCG/ACT nasal spray Place 2 sprays into both nostrils daily. Patient taking differently: Place 2 sprays into both nostrils in the morning and at bedtime. 03/09/20  Yes Tonia Ghent, MD  mirtazapine (REMERON) 15 MG tablet Take 15 mg by mouth at bedtime as needed (sleep).   Yes [provider]  omeprazole (PRILOSEC) 20 MG capsule Take 20 mg by mouth daily before breakfast.   Yes  [provider]  potassium chloride (KLOR-CON) 10 MEQ tablet TAKE 1 TABLET BY MOUTH TWICE A DAY 10/04/21  Yes Cantwell, Celeste C, PA-C  pramipexole (MIRAPEX) 0.5 MG tablet TAKE 1 TABLET(0.5 MG) BY MOUTH THREE TIMES DAILY 09/20/21  Yes Tat, Eustace Quail, DO  propranolol (INDERAL) 20 MG tablet Take 1 tablet (20 mg total) by mouth 2 (two) times daily. 10/17/21  Yes Adrian Prows, MD  traMADol (ULTRAM) 50 MG tablet TAKE 1 TABLET (50 MG TOTAL) BY MOUTH EVERY 12 (TWELVE) HOURS AS NEEDED. 03/24/20  Yes Tonia Ghent, MD  atorvastatin (LIPITOR) 20 MG tablet Take 1 tablet (20 mg total) by mouth daily. Patient not taking: Reported on 09/27/2021 08/05/21 07/31/22  Alethia Berthold, PA-C     Review of Systems  Positive ROS: As above  All other systems have been reviewed and were otherwise negative with the exception of those mentioned in the HPI and as above.  Objective: Vital signs in last 24 hours: Temp:  [98.6 F (37 C)] 98.6 F (37 C) (07/06 0928) Pulse Rate:  [61] 61 (07/06 0928) Resp:  [18] 18 (07/06 0928) BP: (132)/(79) 132/79 (07/06 0928) SpO2:  [98 %] 98 % (07/06 0928) Weight:  [93 kg] 93 kg (07/06 0928) Estimated body mass index is 25.62 kg/m as calculated from the following:   Height as of this encounter: '6\' 3"'$  (1.905 m).   Weight as of this encounter: 93 kg.   General Appearance: Alert Head: Normocephalic, without obvious abnormality, atraumatic Eyes: PERRL, conjunctiva/corneas clear, EOM's intact,    Ears: Normal  Throat: Normal  Neck: Supple, Back: His lumbar incision is well-healed. Lungs: Clear to auscultation bilaterally, respirations unlabored Heart: Regular rate and rhythm, no murmur, rub or gallop Abdomen: Soft, non-tender Extremities: Extremities normal, atraumatic, no cyanosis or edema Skin: unremarkable  NEUROLOGIC:   Mental status: alert and oriented,Motor Exam - grossly normal Sensory Exam - grossly normal Reflexes:  Coordination - grossly normal Gait -  grossly normal Balance - grossly normal Cranial Nerves: I: smell Not tested  II: visual acuity  OS: Normal  OD: Normal   II: visual fields Full to confrontation  II: pupils Equal, round, reactive to light  III,VII: ptosis None  III,IV,VI: extraocular muscles  Full ROM  V: mastication Normal  V: facial light touch sensation  Normal  V,VII: corneal reflex  Present  VII: facial muscle function - upper  Normal  VII: facial muscle function - lower Normal  VIII: hearing Not tested  IX: soft palate elevation  Normal  IX,X: gag reflex Present  XI: trapezius strength  5/5  XI: sternocleidomastoid strength 5/5  XI: neck flexion strength  5/5  XII: tongue strength  Normal    Data Review Lab Results  Component Value Date   WBC 8.2 10/13/2021   HGB 13.4 10/13/2021   HCT 40.4 10/13/2021   MCV 89.4 10/13/2021   PLT 208 10/13/2021   Lab Results  Component Value Date   NA 134 (L) 10/13/2021   K 3.9 10/13/2021   CL 99 10/13/2021   CO2 25 10/13/2021   BUN 9 10/13/2021   CREATININE 1.15 10/13/2021   GLUCOSE 98 10/13/2021   No results found for: "INR", "PROTIME"  Assessment/Plan: Lumbar adjacent disease with spondylolisthesis, lumbar spinal stenosis, neurogenic claudication, lumbago, lumbar radiculopathy: I have discussed the situation with the patient.  I reviewed his myelo CT with him and pointed out the abnormalities.  We have discussed the various treatment options including surgery.  I have described the surgical treatment option of an L2-3 decompression, instrumentation and fusion.  I have shown him surgical models.  I have given him a surgical pamphlet.  We have discussed the risk, benefits, alternatives, expected postop course, and likelihood of achieving our goals with surgery.  I have answered all his questions.  He has decided proceed with surgery.   Ophelia Charter 10/20/2021 11:07 AM

## 2021-10-20 NOTE — Anesthesia Postprocedure Evaluation (Signed)
Anesthesia Post Note  Patient: [name]  Procedure(s) Performed: PLIF,IP,POSTERIOR INSTRUMENTATION L23 (Back)     Patient location during evaluation: PACU Anesthesia Type: General Level of consciousness: awake and alert Pain management: pain level controlled Vital Signs Assessment: post-procedure vital signs reviewed and stable Respiratory status: spontaneous breathing, nonlabored ventilation, respiratory function stable and patient connected to nasal cannula oxygen Cardiovascular status: blood pressure returned to baseline and stable Postop Assessment: no apparent nausea or vomiting Anesthetic complications: no   No notable events documented.  Last Vitals:  Vitals:   10/20/21 1715 10/20/21 1736  BP: 133/77 (!) 157/91  Pulse: 77 75  Resp: 10 18  Temp: 36.5 C 36.5 C  SpO2: 95% 96%    Last Pain:  Vitals:   10/20/21 1750  TempSrc:   PainSc: 4                  Tiajuana Amass

## 2021-10-21 ENCOUNTER — Other Ambulatory Visit: Payer: Self-pay

## 2021-10-21 ENCOUNTER — Other Ambulatory Visit: Payer: Self-pay | Admitting: Neurology

## 2021-10-21 DIAGNOSIS — I480 Paroxysmal atrial fibrillation: Secondary | ICD-10-CM | POA: Diagnosis not present

## 2021-10-21 DIAGNOSIS — G20 Parkinson's disease: Secondary | ICD-10-CM

## 2021-10-21 DIAGNOSIS — M48062 Spinal stenosis, lumbar region with neurogenic claudication: Secondary | ICD-10-CM | POA: Diagnosis not present

## 2021-10-21 DIAGNOSIS — M4316 Spondylolisthesis, lumbar region: Secondary | ICD-10-CM | POA: Diagnosis not present

## 2021-10-21 DIAGNOSIS — L249 Irritant contact dermatitis, unspecified cause: Secondary | ICD-10-CM | POA: Diagnosis not present

## 2021-10-21 MED ORDER — TIZANIDINE HCL 4 MG PO TABS: 4.0000 mg | ORAL_TABLET | Freq: Three times a day (TID) | ORAL | 0 refills | Status: DC | PRN

## 2021-10-21 MED ORDER — HYDROMORPHONE HCL 2 MG PO TABS: 2.0000 mg | ORAL_TABLET | ORAL | 0 refills | Status: DC | PRN

## 2021-10-21 MED ORDER — HYDROMORPHONE HCL 2 MG PO TABS: 2.0000 mg | ORAL_TABLET | ORAL | Status: DC | PRN

## 2021-10-21 MED ORDER — DOCUSATE SODIUM 100 MG PO CAPS: 100.0000 mg | ORAL_CAPSULE | Freq: Two times a day (BID) | ORAL | 0 refills | Status: AC

## 2021-10-21 NOTE — Progress Notes (Signed)
Pt doing well. Pt and wife given D/C instructions with verbal understanding. Rx's were sent to the pharmacy by MD. Pt's incision is clean and dry with no sign of infection. Pt's IV was removed prior to D/C. Pt received RW and 3-n-1 from [facility] per MD order. Pt D/C'd home via wheelchair per MD order. Pt is stable @ D/C and has no other needs at this time. Holli Humbles, RN

## 2021-10-21 NOTE — Discharge Summary (Signed)
Physician Discharge Summary  Patient ID: [name] MRN: [id number] DOB/AGE: Mar 27, 1946 76 y.o.  Admit date: 10/20/2021 Discharge date: 10/21/2021  Admission Diagnoses: Lumbago, lumbar radiculopathy, lumbar spinal stenosis, neurogenic claudication  Discharge Diagnoses: The same Principal Problem:   Lumbar stenosis with neurogenic claudication   Discharged Condition: good  Hospital Course: I performed an L2-3 decompression, instrumentation and fusion on patient on 10/20/2021.  The surgery went well.  The patient's postoperative course was unremarkable.  On postoperative day #1 he requested discharge to home.  The patient, and his wife, were given verbal and written discharge instructions.  All their questions were answered.  Consults: PT, OT, care management Significant Diagnostic Studies: None Treatments: L2-3 decompression, instrumentation and fusion. Discharge Exam: Blood pressure 112/76, pulse 81, temperature 99.1 F (37.3 C), temperature source Oral, resp. rate 17, height '6\' 3"'$  (1.905 m), weight 93 kg, SpO2 94 %. The patient is alert and pleasant.  He looks well.  His strength is normal.  Disposition: Home  Discharge Instructions     Call MD for:  difficulty breathing, headache or visual disturbances   Complete by: As directed    Call MD for:  extreme fatigue   Complete by: As directed    Call MD for:  hives   Complete by: As directed    Call MD for:  persistant dizziness or light-headedness   Complete by: As directed    Call MD for:  persistant nausea and vomiting   Complete by: As directed    Call MD for:  redness, tenderness, or signs of infection (pain, swelling, redness, odor or green/yellow discharge around incision site)   Complete by: As directed    Call MD for:  severe uncontrolled pain   Complete by: As directed    Call MD for:  temperature >100.4   Complete by: As directed    Diet - low sodium heart healthy   Complete by: As directed    Discharge  instructions   Complete by: As directed    Call [phone] for a followup appointment. Take a stool softener while you are using pain medications.   Driving Restrictions   Complete by: As directed    Do not drive for 2 weeks.   Increase activity slowly   Complete by: As directed    Lifting restrictions   Complete by: As directed    Do not lift more than 5 pounds. No excessive bending or twisting.   May shower / Bathe   Complete by: As directed    Remove the dressing for 3 days after surgery.  You may shower, but leave the incision alone.   Remove dressing in 48 hours   Complete by: As directed       Allergies as of 10/21/2021       Reactions   Flexeril [cyclobenzaprine] Other (See Comments)   Constipation   Hctz [hydrochlorothiazide]    hyponatremia   Morphine And Related Hives   Hallucinations- can tolerate oxycodone.    Phenergan [promethazine] Other (See Comments)   Would avoid due to Parkinsons   Sinemet [carbidopa-levodopa]    intolerant   Tambocor [flecainide] Nausea And Vomiting        Medication List     TAKE these medications    acetaminophen 650 MG CR tablet Commonly known as: TYLENOL Take 1,300 mg by mouth daily as needed for pain.   atorvastatin 20 MG tablet Commonly known as: LIPITOR Take 1 tablet (20 mg total) by mouth daily.  carbidopa-levodopa 25-100 MG tablet Commonly known as: SINEMET IR Take 1 tablet by mouth 3 (three) times daily. 8am/noon/4pm   diclofenac Sodium 1 % Gel Commonly known as: VOLTAREN Apply 1 Application topically 4 (four) times daily as needed (pain.).   diltiazem 180 MG 24 hr capsule Commonly known as: CARDIZEM CD TAKE 1 CAPSULE BY MOUTH EVERY DAY What changed:  how much to take how to take this when to take this additional instructions   docusate sodium 100 MG capsule Commonly known as: COLACE Take 1 capsule (100 mg total) by mouth 2 (two) times daily.   fluticasone 50 MCG/ACT nasal spray Commonly known as:  FLONASE Place 2 sprays into both nostrils daily. What changed: when to take this   HYDROmorphone 2 MG tablet Commonly known as: DILAUDID Take 1-2 tablets (2-4 mg total) by mouth every 4 (four) hours as needed for severe pain.   mirtazapine 15 MG tablet Commonly known as: REMERON Take 15 mg by mouth at bedtime as needed (sleep).   omeprazole 20 MG capsule Commonly known as: PRILOSEC Take 20 mg by mouth daily before breakfast.   potassium chloride 10 MEQ tablet Commonly known as: KLOR-CON TAKE 1 TABLET BY MOUTH TWICE A DAY   pramipexole 0.5 MG tablet Commonly known as: MIRAPEX TAKE 1 TABLET(0.5 MG) BY MOUTH THREE TIMES DAILY   propranolol 20 MG tablet Commonly known as: INDERAL Take 1 tablet (20 mg total) by mouth 2 (two) times daily.   tiZANidine 4 MG tablet Commonly known as: ZANAFLEX Take 1 tablet (4 mg total) by mouth every 8 (eight) hours as needed for muscle spasms.   traMADol 50 MG tablet Commonly known as: ULTRAM TAKE 1 TABLET (50 MG TOTAL) BY MOUTH EVERY 12 (TWELVE) HOURS AS NEEDED.         Signed: Ophelia Charter 10/21/2021, 7:00 AM

## 2021-10-21 NOTE — Evaluation (Signed)
Physical Therapy Evaluation & Discharge Patient Details Name: [name] MRN: [id number] DOB: 1945-10-19 Today's Date: 10/21/2021  History of Present Illness  Pt is a 76 y.o. male s/p L2-3 decompression, instrumentation and fusion on 10/20/21. PMH includes L5-S1 PLIF (2020), lumbar fusion (2016), cervical fusion (2003), Parkinson's disease, HTN, afib, arthritis, vertigo, insomnia.   Clinical Impression  Patient evaluated by Physical Therapy with no further acute PT needs identified. PTA, pt independent without DME, retired from work, lives with supportive wife. Today, pt moving well with preference for RW for added stability; able to complete all mobility training, including stairs, with good ability to maintain back precautions. All education has been completed and the patient has no further questions. Acute PT is signing off. Thank you for this referral.     Recommendations for follow up therapy are one component of a multi-disciplinary discharge planning process, led by the attending physician.  Recommendations may be updated based on patient status, additional functional criteria and insurance authorization.  Follow Up Recommendations No PT follow up      Assistance Recommended at Discharge Intermittent Supervision/Assistance  Patient can return home with the following  A little help with bathing/dressing/bathroom;Assistance with cooking/housework;Assist for transportation;Help with stairs or ramp for entrance    Equipment Recommendations Rolling walker (2 wheels)  Recommendations for Other Services   Occupational Therapy          Precautions / Restrictions Precautions Precautions: Back;Fall Precaution Booklet Issued: Yes (comment) Required Braces or Orthoses: Spinal Brace Spinal Brace: Lumbar corset;Applied in sitting position Restrictions Weight Bearing Restrictions: No      Mobility  Bed Mobility Overal bed mobility: Independent             General bed  mobility comments: able to perform log roll technique with bed flat    Transfers Overall transfer level: Needs assistance Equipment used: Rolling walker (2 wheels) Transfers: Sit to/from Stand Sit to Stand: Min guard           General transfer comment: standing from low bed height to RW with min guard, good hand placement and sequencing without cues    Ambulation/Gait Ambulation/Gait assistance: Min guard, Supervision Gait Distance (Feet): 400 Feet Assistive device: Rolling walker (2 wheels) Gait Pattern/deviations: Step-through pattern, Decreased stride length, Trunk flexed Gait velocity: Decreased     General Gait Details: pt requesting RW use for stability, plans to use at home. slow, mostly steady gait with RW and initial min guard for balance, progressing to supervision; cues for upright posture  Stairs Stairs: Yes Stairs assistance: Supervision Stair Management: One rail Left, Alternating pattern, Step to pattern, Forwards Number of Stairs: 10 General stair comments: ascend steps with LUE rail support and alternating pattern, descend with step-to pattern; supervision for safety  Wheelchair Mobility    Modified Rankin (Stroke Patients Only)       Balance Overall balance assessment: Needs assistance Sitting-balance support: No upper extremity supported, Feet supported Sitting balance-Leahy Scale: Fair     Standing balance support: No upper extremity supported, During functional activity Standing balance-Leahy Scale: Fair Standing balance comment: can static stand and take steps without UE support                             Pertinent Vitals/Pain Pain Assessment Pain Assessment: Faces Faces Pain Scale: Hurts little more Pain Location: lumbar incision Pain Descriptors / Indicators: Sore Pain Intervention(s): Monitored during session, Limited activity within patient's tolerance  Home Living Family/patient expects to be discharged to:: Private  residence Living Arrangements: Spouse/significant other Available Help at Discharge: Family;Available 24 hours/day Type of Home: House Home Access: Stairs to enter Entrance Stairs-Rails: None Entrance Stairs-Number of Steps: 2   Home Layout: Two level;Able to live on main level with bedroom/bathroom (does not access basement) Home Equipment: Rollator (4 wheels);Shower seat;Hand held shower head;Grab bars - tub/shower;Adaptive equipment      Prior Function Prior Level of Function : Independent/Modified Independent             Mobility Comments: independent without DME; retired from work; enjoys doing Neurosurgeon in garage. drives, but prefers to let wife drive ADLs Comments: Indep with ADLs; reports difficulty reaching feet (L>R), uses adaptive equipment for socks and shoes. wife typically performs cooking and Tourist information centre manager        Extremity/Trunk Assessment   Upper Extremity Assessment Upper Extremity Assessment: Overall WFL for tasks assessed (h/o Parkinson's with noted resting hand tremors)    Lower Extremity Assessment Lower Extremity Assessment: Generalized weakness    Cervical / Trunk Assessment Cervical / Trunk Assessment: Back Surgery  Communication   Communication: No difficulties  Cognition Arousal/Alertness: Awake/alert Behavior During Therapy: WFL for tasks assessed/performed Overall Cognitive Status: Within Functional Limits for tasks assessed                                          General Comments General comments (skin integrity, edema, etc.): pt's wife present and supportive. reviewed educ re: precautions, positioning, brace wear/applications, DME needs, activity recommendations    Exercises     Assessment/Plan    PT Assessment Patient does not need any further PT services  PT Problem List         PT Treatment Interventions      PT Goals (Current goals can be found in the Care Plan section)  Acute  Rehab PT Goals PT Goal Formulation: All assessment and education complete, DC therapy    Frequency       Co-evaluation               AM-PAC PT "6 Clicks" Mobility  Outcome Measure Help needed turning from your back to your side while in a flat bed without using bedrails?: None Help needed moving from lying on your back to sitting on the side of a flat bed without using bedrails?: None Help needed moving to and from a bed to a chair (including a wheelchair)?: A Little Help needed standing up from a chair using your arms (e.g., wheelchair or bedside chair)?: A Little Help needed to walk in hospital room?: A Little Help needed climbing 3-5 steps with a railing? : A Little 6 Click Score: 20    End of Session Equipment Utilized During Treatment: Gait belt;Back brace Activity Tolerance: Patient tolerated treatment well Patient left: in bed;with call bell/phone within reach;with family/visitor present Nurse Communication: Mobility status PT Visit Diagnosis: Other abnormalities of gait and mobility (R26.89)    Time: 1287-8676 PT Time Calculation (min) (ACUTE ONLY): 17 min   Charges:   PT Evaluation $PT Eval Low Complexity: [place], PT, DPT Acute Rehabilitation Services  Personal: [facility] Office: [place] 10/21/2021, 8:56 AM

## 2021-10-21 NOTE — Evaluation (Signed)
Occupational Therapy Evaluation and Discharge Patient Details Name: [name] MRN: [id number] DOB: May 14, 1945 Today's Date: 10/21/2021   History of Present Illness Pt is a 76 y.o. male s/p L2-3 decompression, instrumentation and fusion on 10/20/21. PMH includes L5-S1 PLIF (2020), lumbar fusion (2016), cervical fusion (2003), Parkinson's disease, HTN, afib, arthritis, vertigo, insomnia.   Clinical Impression   PTA, pt was living with his wife and was independent with use of AE for socks and shoes. Currently, pt requires Supervision for ADLs and functional mobility using RW. Provided education and handout on back precautions, brace management, bed mobility, grooming, LB ADLs with AE, toileting, and shower transfer with RW and shower seat; pt demonstrated understanding. Answered all pt questions. Recommend dc home once medically stable per physician. All acute OT needs met and will sign off. Thank you.     Recommendations for follow up therapy are one component of a multi-disciplinary discharge planning process, led by the attending physician.  Recommendations may be updated based on patient status, additional functional criteria and insurance authorization.   Follow Up Recommendations  No OT follow up    Assistance Recommended at Discharge PRN  Patient can return home with the following      Functional Status Assessment  Patient has had a recent decline in their functional status and demonstrates the ability to make significant improvements in function in a reasonable and predictable amount of time.  Equipment Recommendations  BSC/3in1    Recommendations for Other Services       Precautions / Restrictions Precautions Precautions: Back;Fall Precaution Booklet Issued: Yes (comment) Required Braces or Orthoses: Spinal Brace Spinal Brace: Lumbar corset;Applied in sitting position Restrictions Weight Bearing Restrictions: No      Mobility Bed Mobility                General bed mobility comments: OOB after PT session    Transfers Overall transfer level: Needs assistance Equipment used: None Transfers: Sit to/from Stand Sit to Stand: Supervision           General transfer comment: Supervision for safety      Balance Overall balance assessment: Needs assistance Sitting-balance support: No upper extremity supported, Feet supported Sitting balance-Leahy Scale: Fair     Standing balance support: No upper extremity supported, During functional activity Standing balance-Leahy Scale: Fair                             ADL either performed or assessed with clinical judgement   ADL Overall ADL's : Needs assistance/impaired                                       General ADL Comments: Pt performing at Supervision level for safety. Provding education on back precautions, brace management, grooming, bed mobility, LB ADLs with AE, toileting, and shower trasnfer with RW.     Vision Baseline Vision/History: 1 Wears glasses       Perception     Praxis      Pertinent Vitals/Pain Pain Assessment Pain Assessment: Faces Faces Pain Scale: Hurts little more Pain Location: lumbar incision Pain Descriptors / Indicators: Sore Pain Intervention(s): Monitored during session, Repositioned, Premedicated before session     Hand Dominance Right   Extremity/Trunk Assessment Upper Extremity Assessment Upper Extremity Assessment: Overall WFL for tasks assessed (tremors with Parkinson's)   Lower Extremity Assessment Lower Extremity  Assessment: Generalized weakness   Cervical / Trunk Assessment Cervical / Trunk Assessment: Back Surgery   Communication Communication Communication: No difficulties   Cognition Arousal/Alertness: Awake/alert Behavior During Therapy: WFL for tasks assessed/performed Overall Cognitive Status: Within Functional Limits for tasks assessed                                 General  Comments: Slightly slow processing due to medication - pt reporting "a little off"     General Comments  Wife present    Exercises     Shoulder Instructions      Home Living Family/patient expects to be discharged to:: Private residence Living Arrangements: Spouse/significant other Available Help at Discharge: Family;Available 24 hours/day Type of Home: House Home Access: Stairs to enter [employer] of Steps: 2 Entrance Stairs-Rails: None Home Layout: Two level;Able to live on main level with bedroom/bathroom (does not access basement)     Bathroom Shower/Tub: Occupational psychologist: Handicapped height Bathroom Accessibility: Yes   Home Equipment: Rollator (4 wheels);Shower seat;Hand held shower head;Grab bars - tub/shower;Adaptive equipment Adaptive Equipment: Sock aid;Long-handled shoe horn        Prior Functioning/Environment Prior Level of Function : Independent/Modified Independent             Mobility Comments: independent without DME ADLs Comments: Indep with ADLs; reports difficulty reaching feet (L>R), uses adaptive equipment for socks and shoes. wife typically performs cooking and cleaning. retired but continues to work occasionally; enjoys doing Neurosurgeon in garage. drives, but prefers to let wife drive        OT Problem List: Decreased range of motion;Decreased activity tolerance;Impaired balance (sitting and/or standing);Decreased knowledge of use of DME or AE;Decreased knowledge of precautions      OT Treatment/Interventions:      OT Goals(Current goals can be found in the care plan section) Acute Rehab OT Goals Patient Stated Goal: Go home OT Goal Formulation: All assessment and education complete, DC therapy  OT Frequency:      Co-evaluation              AM-PAC OT "6 Clicks" Daily Activity     Outcome Measure Help from another person eating meals?: None Help from another person taking care of personal  grooming?: None Help from another person toileting, which includes using toliet, bedpan, or urinal?: A Little Help from another person bathing (including washing, rinsing, drying)?: A Little Help from another person to put on and taking off regular upper body clothing?: A Little Help from another person to put on and taking off regular lower body clothing?: A Little 6 Click Score: 20   End of Session Equipment Utilized During Treatment: Back brace Nurse Communication: Mobility status  Activity Tolerance: Patient tolerated treatment well Patient left: in chair;with call bell/phone within reach;with family/visitor present  OT Visit Diagnosis: Unsteadiness on feet (R26.81);Other abnormalities of gait and mobility (R26.89);Muscle weakness (generalized) (M62.81)                Time: 0932-3557 OT Time Calculation (min): 25 min Charges:  OT General Charges $OT Visit: 1 Visit OT Evaluation $OT Eval Low Complexity: 1 Low OT Treatments $Self Care/Home Management : 8-22 mins  Shawnita Krizek MSOT, OTR/L Acute Rehab Office: [place] 10/21/2021, 9:14 AM

## 2021-10-24 ENCOUNTER — Other Ambulatory Visit: Payer: Self-pay | Admitting: Student

## 2021-10-24 ENCOUNTER — Telehealth: Payer: Self-pay | Admitting: Neurology

## 2021-10-24 DIAGNOSIS — R600 Localized edema: Secondary | ICD-10-CM

## 2021-10-24 NOTE — Telephone Encounter (Signed)
Patient said he has tried taking medication without protein and has the same sickening effect. He said it causes his stomach to really cramp and cause him a lot of pain. Looking for another alternative

## 2021-10-24 NOTE — Telephone Encounter (Signed)
Let patient know Dr. Carles Collet is out of the office until Monday and the recommendations of Dr. Delice Lesch to continue Pramipexole and D/C carbidopa levodopa for now

## 2021-10-24 NOTE — Telephone Encounter (Signed)
Patient would like to know if he can take anything else other than carbi/levo. It is making him sick as a dog and he cant tolerate it

## 2021-10-24 NOTE — Telephone Encounter (Signed)
Pls let patient know Dr. Carles Collet is out this week but back on Monday. Stop carbidopa/levodopa. Continue Ropinirole. Further recommendations when Dr. Carles Collet returns, thanks

## 2021-10-25 ENCOUNTER — Other Ambulatory Visit: Payer: Self-pay | Admitting: Cardiology

## 2021-10-25 ENCOUNTER — Telehealth: Payer: Self-pay

## 2021-10-25 DIAGNOSIS — I10 Essential (primary) hypertension: Secondary | ICD-10-CM

## 2021-10-25 MED ORDER — POLYETHYLENE GLYCOL 3350 17 GM/SCOOP PO POWD: 17.0000 g | Freq: Two times a day (BID) | ORAL | 1 refills | Status: DC | PRN

## 2021-10-25 NOTE — Telephone Encounter (Signed)
Would try miralax.  Rx sent.  Make sure he is still passing gas and triage patient in the meantime.  Thanks.

## 2021-10-25 NOTE — Telephone Encounter (Signed)
Patient called in stating that he had back surgery last Thursday and since his surgery he has not had as many bowel movements, has been taking stool softeners but it hasnt helped him at all and is wondering if there is anything else that can be called in.

## 2021-10-25 NOTE — Telephone Encounter (Signed)
Pt notified as instructed and pt voiced understanding. Pts wife will pick up the miralax at the pharmacy.last normal BM was 10/17/21 or 10/18/21.pt recently had back surgery and is taking hydromorphone for pain which can be constipating. Pt was trying to eat fruit but has stopped due to no appetite due to not digesting food. Pt is passing gas. Has lower mid constant dull abd pain and is nauseated due to feeling so full. Pt has been drinking a lot of water or gingerale.  Pt does not have a fever. UC & ED precautions given and pt voiced understanding. Pt will cb with update in 1 - 2 days. Sending note to Dr Damita Dunnings.

## 2021-10-25 NOTE — Telephone Encounter (Signed)
Noted. Thanks.

## 2021-10-26 MED FILL — Sodium Chloride IV Soln 0.9%: INTRAVENOUS | Qty: 2000 | Status: AC

## 2021-10-26 MED FILL — Heparin Sodium (Porcine) Inj 1000 Unit/ML: INTRAMUSCULAR | Qty: 30 | Status: AC

## 2021-10-27 ENCOUNTER — Other Ambulatory Visit: Payer: Self-pay

## 2021-10-27 DIAGNOSIS — G20 Parkinson's disease: Secondary | ICD-10-CM

## 2021-10-27 MED ORDER — CARBIDOPA-LEVODOPA ER 25-100 MG PO TBCR: EXTENDED_RELEASE_TABLET | ORAL | 0 refills | Status: DC

## 2021-10-27 NOTE — Telephone Encounter (Signed)
Called patient and D/C 25/100 IR carbidopa levodopa TID removed from his chart  and sent in new prescription for 25/100 CR TID 7am/ 11 AM and 4 PM

## 2021-11-01 ENCOUNTER — Other Ambulatory Visit: Payer: Self-pay | Admitting: Student

## 2021-11-01 DIAGNOSIS — R600 Localized edema: Secondary | ICD-10-CM

## 2021-11-02 ENCOUNTER — Other Ambulatory Visit: Payer: Self-pay | Admitting: Student

## 2021-11-02 DIAGNOSIS — H35033 Hypertensive retinopathy, bilateral: Secondary | ICD-10-CM | POA: Diagnosis not present

## 2021-11-03 ENCOUNTER — Other Ambulatory Visit: Payer: Self-pay | Admitting: Neurology

## 2021-11-03 DIAGNOSIS — G20 Parkinson's disease: Secondary | ICD-10-CM

## 2021-11-11 ENCOUNTER — Telehealth: Payer: Self-pay | Admitting: Neurology

## 2021-11-11 NOTE — Telephone Encounter (Signed)
Pt stated that the carbidopa is making him sick to his stomach he is taken it with a Carb like talked about with [facility] in [place] phone note, he said that he has been shaking more in his right hand after restarting the medication.

## 2021-11-11 NOTE — Telephone Encounter (Signed)
Patient called to report he has no appetite after taking carbidopa levodopa, it makes him sick to his stomach, same as last time.  [facility] on [address]

## 2021-11-11 NOTE — Telephone Encounter (Signed)
Pt called to get more information on how is feeling. No answer left a voice mail to call the office back

## 2021-11-14 NOTE — Telephone Encounter (Signed)
Pls confirm if this is the CR carbidopa/levodopa that is also making him sick? Yes it is the CR  carbidopa/levodopa. If yes, pls have him stop. Dr. Carles Collet is out of the office but back tomorrow for further recommendations. [name] stated he will stop carbidopa/levodopa until he hears back from the office

## 2021-11-14 NOTE — Telephone Encounter (Signed)
Pt stated that like before he had the really bad cramps and felt sick to his stomach. He was told that Dr Tat said that she would like for him to try to back down so he only takes it once per day with food for a week and then bid with food for a week and then trial tid with food. Pt was advised to call us that any time during the titration to call us if it still made him feel bad. He stated he would give it a try

## 2021-11-14 NOTE — Telephone Encounter (Signed)
Pls confirm if this is the CR carbidopa/levodopa that is also making him sick. If yes, pls have him stop. Dr. Carles Collet is out of the office but back tomorrow for further recommendations. Thanks

## 2021-11-15 ENCOUNTER — Other Ambulatory Visit: Payer: Self-pay | Admitting: Student

## 2021-11-15 DIAGNOSIS — M48062 Spinal stenosis, lumbar region with neurogenic claudication: Secondary | ICD-10-CM | POA: Diagnosis not present

## 2021-11-18 ENCOUNTER — Encounter (HOSPITAL_COMMUNITY): Payer: Self-pay | Admitting: Neurosurgery

## 2021-11-18 ENCOUNTER — Other Ambulatory Visit: Payer: Self-pay | Admitting: Student

## 2021-11-18 DIAGNOSIS — R600 Localized edema: Secondary | ICD-10-CM

## 2021-11-26 ENCOUNTER — Other Ambulatory Visit: Payer: Self-pay | Admitting: Neurology

## 2021-11-26 DIAGNOSIS — G20 Parkinson's disease: Secondary | ICD-10-CM

## 2021-12-01 ENCOUNTER — Telehealth: Payer: Self-pay

## 2021-12-01 ENCOUNTER — Ambulatory Visit (INDEPENDENT_AMBULATORY_CARE_PROVIDER_SITE_OTHER)
Admission: RE | Admit: 2021-12-01 | Discharge: 2021-12-01 | Disposition: A | Discharge status: Home / Self Care | Payer: PPO | Source: Ambulatory Visit | Attending: Primary Care | Admitting: Primary Care

## 2021-12-01 ENCOUNTER — Encounter: Payer: Self-pay | Admitting: Primary Care

## 2021-12-01 ENCOUNTER — Ambulatory Visit (INDEPENDENT_AMBULATORY_CARE_PROVIDER_SITE_OTHER): Payer: PPO | Admitting: Primary Care

## 2021-12-01 VITALS — BP 108/74 | HR 69 | Temp 98.6°F | Wt 199.0 lb

## 2021-12-01 DIAGNOSIS — K5900 Constipation, unspecified: Secondary | ICD-10-CM

## 2021-12-01 DIAGNOSIS — R109 Unspecified abdominal pain: Secondary | ICD-10-CM | POA: Diagnosis not present

## 2021-12-01 NOTE — Progress Notes (Signed)
Subjective:    Patient ID: [name], male    DOB: 01-06-1946, 76 y.o.   MRN: [id number]  Constipation Pertinent negatives include no abdominal pain, nausea or vomiting.    [name] is a very pleasant 76 y.o. male patient of Dr. Damita Dunnings with a history of lumbar fusion in July 2023, Parkinson's Disease, chronic back pain, atrial fibrillation with RVR, constipation who presents today to discuss constipation.   Chronic and intermittent constipation over the years. More consistent since early July 2023 after his lumbar fusion. Bowel movements occur daily to every 2 days and are "like small pebbles" for which are firm.   He took Miralax once daily, then switched and Colace 100 mg BID and didn't notice improvement on either. He's also increased his water intake to 60 oz daily, is trying to walk. Two days ago he took half a bottle of magnesium citrate without relief. Last night his wife attempted to disimpact him, was able to pull out some stool. Last night he attempted an enema but the liquid came right back out. He also took two Ducolax last night.   His last bowel movement was today, small, firm, pebbles.  No recent use of Dilaudid. He is still taking Tramadol 2-3 times weekly. His appetite is overall decreased. He denies nausea/vomiting.   Review of Systems  Constitutional:  Positive for appetite change.  Gastrointestinal:  Positive for constipation. Negative for abdominal distention, abdominal pain, blood in stool, nausea and vomiting.         Past Medical History:  Diagnosis Date   A-fib ([place])    Arthritis    GERD (gastroesophageal reflux disease)    H/O urinary frequency    Heart murmur    as a child   History of bronchitis    HSV infection    oral   Hypertension    improved after back pain treated with surgery   Insomnia    Insomnia    Improved with Ambien, did not tolerate melatonin.   Joint pain    Parkinson disease ([place])    Sleep apnea    no CPAP    Vertigo 10/13/2014   recurrent; trigger is cool air on left ear, improved with daily antihistamine    Social History   Socioeconomic History   Marital status: Married    Spouse name: Not on file   Number of children: 2   Years of education: 14   Highest education level: Not on file  Occupational History   Occupation: retired  Tobacco Use   Smoking status: Never   Smokeless tobacco: Never  Vaping Use   Vaping Use: Never used  Substance and Sexual Activity   Alcohol use: No   Drug use: No   Sexual activity: Not on file  Other Topics Concern   Not on file  Social History Narrative   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     Social Determinants of Health   Financial Resource Strain: Low Risk  (09/06/2021)   Overall Financial Resource Strain (CARDIA)    Difficulty of Paying Living Expenses: Not hard at all  Food Insecurity: No Food Insecurity (09/06/2021)   Hunger Vital Sign    Worried About Running Out of Food in the Last Year: Never  true    Ran Out of Food in the Last Year: Never true  Transportation Needs: No Transportation Needs (09/06/2021)   PRAPARE - Hydrologist (Medical): No    Lack of Transportation (Non-Medical): No  Physical Activity: Inactive (09/06/2021)   Exercise Vital Sign    Days of Exercise per Week: 0 days    Minutes of Exercise per Session: 0 min  Stress: No Stress Concern Present (09/06/2021)   [place]    Feeling of Stress : Not at all  Social Connections: [place] (09/06/2021)   Social Connection and Isolation Panel [NHANES]    Frequency of Communication with Friends and Family: More than three times a week    Frequency of Social Gatherings with  Friends and Family: More than three times a week    Attends Religious Services: More than 4 times per year    Active Member of [employer] or Organizations: Yes    Attends Archivist Meetings: More than 4 times per year    Marital Status: Married  Human resources officer Violence: Not At Risk (09/06/2021)   Humiliation, Afraid, Rape, and Kick questionnaire    Fear of Current or Ex-Partner: No    Emotionally Abused: No    Physically Abused: No    Sexually Abused: No    Past Surgical History:  Procedure Laterality Date   ACHILLES TENDON REPAIR Left    ALLOGRAFT APPLICATION Left 42/35/3614   Procedure: APPLICATION OF  INTEGRA;  Surgeon: Irene Limbo, MD;  Location: [facility];  Service: Plastics;  Laterality: Left;   BACK SURGERY     COLONOSCOPY  02/2011   Negative,[facility]   DEBRIDEMENT AND CLOSURE WOUND Left 02/25/2018   Procedure: DEBRIDEMENT LEFT LEG APPLICATION INTEGRA;  Surgeon: Irene Limbo, MD;  Location: [facility];  Service: Plastics;  Laterality: Left;   EYE SURGERY Bilateral    cataracts   INCISION AND DRAINAGE OF WOUND Left 11/12/2020   Procedure: DEBRIDEMENT LLE ULCER;  Surgeon: Irene Limbo, MD;  Location: [facility];  Service: Plastics;  Laterality: Left;   KNEE ARTHROSCOPY     L   LUMBAR FUSION     2016   LUMBAR SPINE SURGERY  03/31/2019   exploration of lumbar fusion w/L5-S1 decompression redo L4-5 fusion   SPINE SURGERY  2003   cervical fusion   TONSILLECTOMY     varicoelectomy      Family History  Problem Relation Age of Onset   Hypertension Mother    Asthma Mother    Diabetes Father    Stroke Father    Heart attack Paternal Grandfather        >55   Cancer Sister    Colon cancer Neg Hx    Prostate cancer Neg Hx     Allergies  Allergen Reactions   Flexeril [Cyclobenzaprine] Other (See Comments)    Constipation   Hctz [Hydrochlorothiazide]     hyponatremia   Morphine And Related  Hives    Hallucinations- can tolerate oxycodone.    Phenergan [Promethazine] Other (See Comments)    Would avoid due to Parkinsons   Sinemet [Carbidopa-Levodopa]     intolerant   Tambocor [Flecainide] Nausea And Vomiting    Current Outpatient Medications on File Prior to Visit  Medication Sig Dispense Refill   acetaminophen (TYLENOL) 650 MG CR tablet Take 1,300 mg by mouth daily as needed for pain.  Carbidopa-Levodopa ER (SINEMET CR) 25-100 MG tablet controlled release TAKE 1 TABLET BY MOUTH AT 7 AM, 1 AT 11 AM, AND 1 AT 4PM 270 tablet 0   diclofenac Sodium (VOLTAREN) 1 % GEL Apply 1 Application topically 4 (four) times daily as needed (pain.).     diltiazem (CARDIZEM CD) 180 MG 24 hr capsule TAKE 1 CAPSULE BY MOUTH EVERY DAY 90 capsule 0   docusate sodium (COLACE) 100 MG capsule Take 1 capsule (100 mg total) by mouth 2 (two) times daily. 60 capsule 0   fluticasone (FLONASE) 50 MCG/ACT nasal spray Place 2 sprays into both nostrils daily. (Patient taking differently: Place 2 sprays into both nostrils in the morning and at bedtime.)     HYDROmorphone (DILAUDID) 2 MG tablet Take 1-2 tablets (2-4 mg total) by mouth every 4 (four) hours as needed for severe pain. 30 tablet 0   mirtazapine (REMERON) 15 MG tablet Take 15 mg by mouth at bedtime as needed (sleep).     omeprazole (PRILOSEC) 20 MG capsule Take 20 mg by mouth daily before breakfast.     polyethylene glycol powder (GLYCOLAX/MIRALAX) 17 GM/SCOOP powder Take 17 g by mouth 2 (two) times daily as needed for moderate constipation. 510 g 1   potassium chloride (KLOR-CON) 10 MEQ tablet TAKE 1 TABLET BY MOUTH TWICE A DAY 180 tablet 1   pramipexole (MIRAPEX) 0.5 MG tablet TAKE 1 TABLET(0.5 MG) BY MOUTH THREE TIMES DAILY 270 tablet 0   propranolol (INDERAL) 20 MG tablet TAKE 1 TABLET BY MOUTH TWICE A DAY 30 tablet 3   tiZANidine (ZANAFLEX) 4 MG tablet Take 1 tablet (4 mg total) by mouth every 8 (eight) hours as needed for muscle spasms. 30  tablet 0   traMADol (ULTRAM) 50 MG tablet TAKE 1 TABLET (50 MG TOTAL) BY MOUTH EVERY 12 (TWELVE) HOURS AS NEEDED. 40 tablet 0   atorvastatin (LIPITOR) 20 MG tablet Take 1 tablet (20 mg total) by mouth daily. (Patient not taking: Reported on 09/27/2021) 90 tablet 3   No current facility-administered medications on file prior to visit.    BP 108/74   Pulse 69   Temp 98.6 F (37 C) (Oral)   Wt 199 lb (90.3 kg)   SpO2 97%   BMI 24.87 kg/m  Objective:   Physical Exam Constitutional:      General: He is not in acute distress. Pulmonary:     Effort: Pulmonary effort is normal.  Abdominal:     General: Bowel sounds are normal. There is no distension.     Palpations: Abdomen is soft.     Tenderness: There is no abdominal tenderness.     Comments: Rectal exam completed today, no impacted stool or other masses noted. Chaperone present   Skin:    General: Skin is warm and dry.  Neurological:     Mental Status: He is alert.           Assessment & Plan:   Problem List Items Addressed This Visit       Other   Constipation - Primary    No alarm signs on exam.  Increase Miralax to twice a day everyday and Docusate sodium 100 mg twice a day. Ok to decrease Miralax to once daily with any loose stools. Cautioned patient regarding miralax use and diarrhea.   Abdominal x-ray ordered today to rule out Ileus, small bowel obstruction or any other abnormalities.  Discussed to avoid laxatives at this time.  Will continue to monitor.  No  alarm signs on exam.  I evaluated patient, was consulted regarding treatment, and agree with assessment and plan per Tinnie Gens, RN, DNP student.   Allie Bossier, NP-C       Relevant Orders   DG Abd 2 Views       Pleas Koch, NP

## 2021-12-01 NOTE — Telephone Encounter (Signed)
[facility] Night - Client TELEPHONE ADVICE RECORD [facility] Patient Name: [name] Gender: Male DOB: 1945/12/24 Age: 76 Y 27 M 22 D Return Phone Number: [phone] (Primary) Address: City/ State/ Zip: [name] [address] Client [name] Night - Client Client Site [name] - Night Provider [name] - MD Contact Type Call Who Is Calling Patient / Member / Family / Caregiver Call Type Triage / Clinical Relationship To Patient Self Return Phone Number [phone] (Primary) Chief Complaint Constipation Reason for Call Symptomatic / Request for [name] states he had back surgery on 7/6, and he is having his issues with his stool. He's not able to pass stool. He did an enema at 5pm yesterday, and it didnt work, it came out. What can he do? Translation No Nurse Assessment Nurse: Luan Pulling, RN, [place] Date/Time (Eastern Time): 12/01/2021 6:03:12 AM Confirm and document reason for call. If symptomatic, describe symptoms. ---Caller states he had back surgery on 7/6. He's not able to pass stool. He did an enema at 5pm yesterday, and it didn't work, it came out within a few min. His wife manually removed stool yesterday. Last BM was about a week ago and has been taking stool softeners and pain meds from the surgery. Does the patient have any new or worsening symptoms? ---Yes Will a triage be completed? ---Yes Related visit to physician within the last 2 weeks? ---No Does the PT have any chronic conditions? (i.e. diabetes, asthma, this includes High risk factors for pregnancy, etc.) ---Yes List chronic conditions. ---Parkinson's Is this a behavioral health or substance abuse call? ---No Guidelines Guideline Title Affirmed Question Affirmed Notes Nurse Date/Time (Eastern Time) Constipation Last bowel movement (BM) > 4 days ago Luan Pulling,  RN, Maryjean Morn 12/01/2021 6:06:19 AM PLEASE NOTE: All timestamps contained within this report are represented as [place] Standard Time. CONFIDENTIALTY NOTICE: This fax transmission is intended only for the addressee. It contains information that is legally privileged, confidential or otherwise protected from use or disclosure. If you are not the intended recipient, you are strictly prohibited from reviewing, disclosing, copying using or disseminating any of this information or taking any action in reliance on or regarding this information. If you have received this fax in error, please notify us immediately by telephone so that we can arrange for its return to [place]. Phone: [phone], Toll-Free: [phone], Fax: [phone] Page: 2 of 2 Call Id: [id number] [place]. Time Eilene Ghazi Time) Disposition Final User 12/01/2021 6:11:02 AM See PCP within 24 Hours Yes Luan Pulling, RN, Maryjean Morn Final Disposition 12/01/2021 6:11:02 AM See PCP within 24 Hours Yes Luan Pulling, RN, Maryjean Morn Caller Disagree/Comply Comply Caller Understands Yes PreDisposition Go to ED Care Advice Given Per Guideline SEE PCP WITHIN 24 HOURS: * IF OFFICE WILL BE OPEN: You need to be examined within the next 24 hours. Call your doctor (or NP/PA) when the office opens and make an appointment. CALL BACK IF: * Severe or increasing abdomen pain * Constant abdomen pain lasting over 2 hours * Vomiting occurs * You become worse CARE ADVICE given per Constipation (Adult) guideline. Referrals REFERRED TO PCP OFFIC

## 2021-12-01 NOTE — Telephone Encounter (Signed)
Patient evaluated.  

## 2021-12-01 NOTE — Patient Instructions (Addendum)
Start Miralax twice a day every day.  Start Colace 1 tablet twice a day every day. Keep up with the good work with your water intake.   Complete xray(s) prior to leaving today. I will notify you of your results once received.   It was a pleasure to see you today!

## 2021-12-01 NOTE — Assessment & Plan Note (Addendum)
No alarm signs on exam.  Increase Miralax to twice a day everyday and Docusate sodium 100 mg twice a day. Ok to decrease Miralax to once daily with any loose stools. Cautioned patient regarding miralax use and diarrhea.   Abdominal x-ray ordered today to rule out Ileus, small bowel obstruction or any other abnormalities.  Discussed to avoid laxatives at this time.  Will continue to monitor.  No alarm signs on exam.  I evaluated patient, was consulted regarding treatment, and agree with assessment and plan per Tinnie Gens, RN, DNP student.   Allie Bossier, NP-C

## 2021-12-01 NOTE — Telephone Encounter (Signed)
Per chart review tab pt has already seen Gentry Fitz NP 12/01/21. Sending note to Gentry Fitz NP.

## 2021-12-01 NOTE — Progress Notes (Signed)
Established Patient Office Visit  Subjective   Patient ID: [name], male    DOB: [birth year]  Age: 76 y.o. MRN: [id number]  Chief Complaint  Patient presents with   Constipation    Last bowel movement was before surgery in July. Has tried otc meds no help. Bloating in lower abdomen. Has had gas and small hard stools.     HPI  [name] is a 76 year old male patient of Dr. Damita Dunnings, with past medical history of hypothyroidism, Parkinson's disease, chronic back pain, Afib, hypertension, GERD, constipation presents for an acute visit. He had a L2-3 decompression, instrumentation and fusion on 10/20/2021. Since his surgery he has been constipated. He sent a mychart message on 10/25/21 and was advised to increase his water intake and start Miralax.  Today he reports that he is very uncomfortable. He has had some small bowel movements with no visible blood. He reports those were very hard and small pebbles. His last small BM was this morning. He reports that he did an enema yesterday that did not hold and his wife had to disimpact him yesterday. He also took dulcolax tablets after the enema. He has had a decreased appetite, denies any nausea and vomiting. He is passing gas and feels bloated all the time. He also tried half bottle of magnesium citrate with no relief.  He was prescribed Dilaudid right after the surgery but now has discontinued. He is taking Tramadol 2-3 times a week for surgical pain.      Review of Systems  Respiratory: Negative.    Cardiovascular: Negative.   Gastrointestinal:  Positive for abdominal pain. Negative for blood in stool, constipation, nausea and vomiting.       Gas and bloating  All other systems reviewed and are negative.     Objective:     BP 108/74   Pulse 69   Temp 98.6 F (37 C) (Oral)   Wt 199 lb (90.3 kg)   SpO2 97%   BMI 24.87 kg/m  BP Readings from Last 3 Encounters:  12/01/21 108/74  10/21/21 98/63  10/13/21 120/84       Physical Exam Vitals and nursing note reviewed.  Constitutional:      Appearance: Normal appearance.  Cardiovascular:     Rate and Rhythm: Normal rate and regular rhythm.     Pulses: Normal pulses.     Heart sounds: Normal heart sounds.  Pulmonary:     Effort: Pulmonary effort is normal.     Breath sounds: Normal breath sounds.  Abdominal:     General: Bowel sounds are normal. There is no distension.     Palpations: Abdomen is soft.     Tenderness: There is abdominal tenderness. There is no guarding.  Neurological:     Mental Status: He is alert and oriented to person, place, and time.  Psychiatric:        Mood and Affect: Mood normal.        Behavior: Behavior normal.     No results found for any visits on 12/01/21.   The 10-year ASCVD risk score (Arnett DK, et al., 2019) is: 21.4%    Assessment & Plan:   Problem List Items Addressed This Visit       Other   Constipation - Primary    Rectal exam negative for disimpaction.  Bowel sounds present. No alarm signs.   Increase Miralax to twice a day everyday and Docusate sodium 100 mg twice a day. Ok to  decrease Miralax to once a day after he has soft BMs daily.   Abdominal x-ray ordered today to rule out Ileus, small bowel obstruction or any other abnormalities. X-ray pending.  Discussed to avoid laxatives at this time.  Will continue to monitor.       Relevant Orders   DG Abd 2 Views    No follow-ups on file.    Tinnie Gens, BSN-RN, DNP STUDENT

## 2021-12-03 ENCOUNTER — Encounter: Payer: Self-pay | Admitting: Neurology

## 2021-12-06 ENCOUNTER — Other Ambulatory Visit: Payer: Self-pay

## 2021-12-06 DIAGNOSIS — G20 Parkinson's disease: Secondary | ICD-10-CM

## 2021-12-06 MED ORDER — RYTARY 23.75-95 MG PO CPCR: ORAL_CAPSULE | ORAL | 0 refills | Status: DC

## 2021-12-06 NOTE — Progress Notes (Signed)
Patient supplied samples and carbidopa levodopa removed from patiens chart reminder,added for three weeks

## 2021-12-13 DIAGNOSIS — M5412 Radiculopathy, cervical region: Secondary | ICD-10-CM | POA: Diagnosis not present

## 2021-12-29 ENCOUNTER — Other Ambulatory Visit: Payer: Self-pay | Admitting: Cardiology

## 2022-01-05 ENCOUNTER — Telehealth: Payer: Self-pay | Admitting: Neurology

## 2022-01-05 NOTE — Telephone Encounter (Signed)
Called patient back and he told me that if he takes the CL or doesn't that it has no effect at all on his tremors

## 2022-01-05 NOTE — Telephone Encounter (Signed)
Patient called and said the carbidopa levodopa he tried has not helped his tremors but has helped his appetite.  [facility] [address] in [place]

## 2022-01-05 NOTE — Telephone Encounter (Signed)
Pt called back in returning [place]'s call 

## 2022-01-05 NOTE — Telephone Encounter (Signed)
Called patient & left voice mail.

## 2022-01-06 ENCOUNTER — Other Ambulatory Visit: Payer: Self-pay

## 2022-01-06 ENCOUNTER — Telehealth: Payer: Self-pay | Admitting: Neurology

## 2022-01-06 DIAGNOSIS — G20 Parkinson's disease: Secondary | ICD-10-CM

## 2022-01-06 MED ORDER — RYTARY 48.75-195 MG PO CPCR: ORAL_CAPSULE | ORAL | 0 refills | Status: DC

## 2022-01-06 MED ORDER — RYTARY 36.25-145 MG PO CPCR: ORAL_CAPSULE | ORAL | 0 refills | Status: DC

## 2022-01-06 NOTE — Telephone Encounter (Signed)
Called patient and we are providing samples for this patient they are on my desk for his pick up

## 2022-01-06 NOTE — Telephone Encounter (Signed)
1. Which medications need refilled? (List name and dosage, if known) carbidopa-levodopa ER  2. Which pharmacy/location is medication to be sent to? (include street and city if local pharmacy) [facility] is almost out. He mentioned someone might be changing it to another type?

## 2022-01-06 NOTE — Telephone Encounter (Signed)
Called patient and he understands in order to get  DBS he will need to stay on carbidopa levodopa

## 2022-01-12 ENCOUNTER — Other Ambulatory Visit: Payer: Self-pay | Admitting: Cardiology

## 2022-01-12 DIAGNOSIS — I10 Essential (primary) hypertension: Secondary | ICD-10-CM

## 2022-01-13 NOTE — Progress Notes (Signed)
Office Visit Note  Patient: [name]             Date of Birth: 1946/03/24           MRN: [id number]             PCP: Tonia Ghent, MD Referring: Marchia Bond, MD Visit Date: 01/20/2022 Occupation: '@GUAROCC'$ @  Subjective:  Pain in multiple joints  History of Present Illness: [name] is a 76 y.o. male seen in consultation per request of Dr. Mardelle Matte.  According the patient his symptoms a started approximately 2 years ago with pain and discomfort in his bilateral hands and his knee joints.  He has been under care of Dr. Mardelle Matte.  He also describes discomfort in his elbows.  He states Dr. Mardelle Matte has been diagnosed him with osteoarthritis.  He developed increased pain and swelling in his knee joints in April.  At that time he had cortisone injection which gave him relief.  He is also had viscosupplement injections to his knee joints.  He states his symptoms have improved but not completely resolved.  He continues to have discomfort in his bilateral hands, occasional discomfort in his elbows and bilateral knee joints.  He has known history of disc disease of cervical spine.  According to him he underwent C-spine fusion about 2 years ago.  He also have disc disease of lumbar spine and is followed by Dr. Arnoldo Morale.  He has had 3 back surgeries in the last lumbar spine fusion was on October 20, 2021 when he had L4-L5 fusion.  The other joints are painful.  There is no history of psoriasis, Achilles tendinitis or planter fasciitis.  There is family history of osteoarthritis in his mother.  Activities of Daily Living:  Patient reports morning stiffness for a few minutes.   Patient Denies nocturnal pain.  Difficulty dressing/grooming: Denies Difficulty climbing stairs: Denies Difficulty getting out of chair: Denies Difficulty using hands for taps, buttons, cutlery, and/or writing: Denies  Review of Systems  Constitutional:  Positive for fatigue.  HENT:  Negative for mouth sores and  mouth dryness.   Eyes:  Negative for dryness.  Respiratory:  Negative for shortness of breath.   Cardiovascular:  Negative for chest pain and palpitations.  Gastrointestinal:  Negative for blood in stool, constipation and diarrhea.  Endocrine: Positive for increased urination.  Genitourinary:  Positive for involuntary urination.  Musculoskeletal:  Positive for joint pain, joint pain, joint swelling, myalgias, muscle weakness, morning stiffness, muscle tenderness and myalgias. Negative for gait problem.  Skin:  Negative for color change, rash, hair loss and sensitivity to sunlight.  Allergic/Immunologic: Negative for susceptible to infections.  Neurological:  Positive for tremors. Negative for dizziness and headaches.  Hematological:  Positive for bruising/bleeding tendency. Negative for swollen glands.  Psychiatric/Behavioral:  Negative for depressed mood and sleep disturbance. The patient is not nervous/anxious.     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   Paroxysmal atrial fibrillation ([place]) 07/15/2021   Irritant dermatitis 04/07/2021   Tinea pedis of both feet 04/07/2021   Peripheral edema 04/07/2021   Acute bronchitis 02/28/2021   Other fatigue 01/02/2021   Change in bowel habit 08/06/2020   Healthcare maintenance 03/31/2020   Hypothyroidism 03/31/2020   Dysfunction of eustachian tube 03/10/2020   Constipation 11/19/2019   Insomnia    Allergic rhinitis 07/03/2019   Hyponatremia 05/15/2019   Atrial fibrillation, chronic ([place]) 05/15/2019  Parkinson's disease 05/04/2019   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HCC)    Atrial fibrillation with RVR ([place]) 04/24/2019   History of recent back surgery 04/24/2019   Body mass index (BMI) 25.0-25.9, adult 04/22/2019   Neural foraminal stenosis of lumbosacral spine 03/31/2019   Degeneration of lumbosacral intervertebral  disc 03/20/2019   Degenerative scoliosis in adult patient 03/20/2019   Other intervertebral disc displacement, lumbosacral region 03/20/2019   Status post lumbar spinal fusion 02/20/2019   Neck pain 02/04/2019   Acute cough 04/24/2018   Skin lesion 10/31/2017   Lower urinary tract symptoms (LUTS) 04/17/2016   Oral lesion 02/16/2016   Medicare annual wellness visit, initial 12/01/2015   Advance care planning 12/01/2015   Lower back pain 12/01/2015   OSA on CPAP    HTN (hypertension) 04/23/2015   Spondylolisthesis of lumbar region 12/16/2014   GERD (gastroesophageal reflux disease) 10/11/2012   RBBB 06/27/2011    Past Medical History:  Diagnosis Date   A-fib ([place])    Arthritis    GERD (gastroesophageal reflux disease)    H/O urinary frequency    Heart murmur    as a child   History of bronchitis    HSV infection    oral   Hypertension    improved after back pain treated with surgery   Insomnia    Insomnia    Improved with Ambien, did not tolerate melatonin.   Joint pain    Parkinson disease    Sleep apnea    no CPAP   Vertigo 10/13/2014   recurrent; trigger is cool air on left ear, improved with daily antihistamine    Family History  Problem Relation Age of Onset   Hypertension Mother    Asthma Mother    Diabetes Father    Stroke Father    Parkinson's disease Father    Cancer Sister    Heart attack Paternal Grandfather        >55   Occular Albinism Son    Hypertension Son    Agricultural engineer Albinism Daughter    Colon cancer Neg Hx    Prostate cancer Neg Hx    Past Surgical History:  Procedure Laterality Date   ACHILLES TENDON REPAIR Left    ALLOGRAFT APPLICATION Left 29/92/4268   Procedure: APPLICATION OF  INTEGRA;  Surgeon: Irene Limbo, MD;  Location: [facility];  Service: Plastics;  Laterality: Left;   BACK SURGERY     COLONOSCOPY  02/2011   Negative,[facility]   DEBRIDEMENT AND CLOSURE WOUND Left 02/25/2018   Procedure: DEBRIDEMENT  LEFT LEG APPLICATION INTEGRA;  Surgeon: Irene Limbo, MD;  Location: [facility];  Service: Plastics;  Laterality: Left;   EYE SURGERY Bilateral    cataracts   INCISION AND DRAINAGE OF WOUND Left 11/12/2020   Procedure: DEBRIDEMENT LLE ULCER;  Surgeon: Irene Limbo, MD;  Location: [facility];  Service: Plastics;  Laterality: Left;   KNEE ARTHROSCOPY     L   LUMBAR FUSION     2016   LUMBAR SPINE SURGERY  03/31/2019   exploration of lumbar fusion w/L5-S1 decompression redo L4-5 fusion   SPINE SURGERY  2003   cervical fusion   TONSILLECTOMY     varicoelectomy     Social History   Social History Narrative   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     Immunization History  Administered Date(s) Administered   Pneumococcal Conjugate-13 05/01/2017     Objective: Vital Signs: BP (!) 143/86 (BP Location: Right Arm, Patient Position: Sitting, Cuff Size: Normal)   Pulse (!) 56   Resp 17   Ht '6\' 3"'$  (1.905 m)   Wt 199 lb 6.4 oz (90.4 kg)   BMI 24.92 kg/m    Physical Exam Vitals and nursing note reviewed.  Constitutional:      Appearance: He is well-developed.  HENT:     Head: Normocephalic and atraumatic.  Eyes:     Conjunctiva/sclera: Conjunctivae normal.     Pupils: Pupils are equal, round, and reactive to light.  Cardiovascular:     Rate and Rhythm: Normal rate and regular rhythm.     Heart sounds: Normal heart sounds.  Pulmonary:     Effort: Pulmonary effort is normal.     Breath sounds: Normal breath sounds.  Abdominal:     General: Bowel sounds are normal.     Palpations: Abdomen is soft.  Musculoskeletal:     Cervical back: Normal range of motion and neck supple.  Skin:    General: Skin is warm and dry.     Capillary Refill:  Capillary refill takes less than 2 seconds.  Neurological:     Mental Status: He is alert and oriented to person, place, and time.     Comments: Resting tremors noted.  Psychiatric:        Behavior: Behavior normal.      Musculoskeletal Exam: He had limited lateral rotation of the cervical spine with stiffness.  He had limited painful range of motion of the lumbar spine.  He has been full range of motion of the shoulder joints.  He had elbow contracture bilaterally without any synovitis.  Wrist joints and MCP joints were in good range of motion without any synovitis.  Bilateral PIP and DIP thickening.  Incomplete fist formation was noted.  No synovitis was noted over PIP or DIP joints.  He had limited range of motion of bilateral hip joints.  He had limited extension of bilateral knee joints.  No warmth swelling or effusion was noted.  No tenderness was noted over ankle joints or MTPs.  CDAI Exam: CDAI Score: -- Patient Global: --; Provider Global: -- Swollen: --; Tender: -- Joint Exam 01/20/2022   No joint exam has been documented for this visit   There is currently no information documented on the homunculus. Go to the Rheumatology activity and complete the homunculus joint exam.  Investigation: No additional findings.  Imaging: No results found.  Recent Labs: Lab Results  Component Value Date   WBC 8.2 10/13/2021   HGB 13.4 10/13/2021   PLT 208 10/13/2021   NA 134 (L) 10/13/2021   K 3.9 10/13/2021   CL 99 10/13/2021   CO2 25 10/13/2021   GLUCOSE 98 10/13/2021   BUN 9 10/13/2021   CREATININE 1.15 10/13/2021   BILITOT 0.8 07/15/2021   ALKPHOS 74 07/15/2021   AST 15 07/15/2021   ALT 13 07/15/2021   PROT 6.6 07/15/2021   ALBUMIN 4.5 07/15/2021   CALCIUM 9.0 10/13/2021   GFRAA >60 05/17/2019    Speciality Comments: No specialty comments available.  Procedures:  No procedures performed Allergies: Flexeril [cyclobenzaprine], Hctz [hydrochlorothiazide], Morphine and  related, Phenergan [promethazine], Sinemet [carbidopa-levodopa], and Tambocor [flecainide]   Assessment / Plan:  Visit Diagnoses: Polyarthralgia-patient complains of pain and discomfort in multiple joint over the years which has been worse over the last 2 years.  He gives history of intermittent swelling in his knee joints.  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  No synovitis was noted.  Pain in both hands -he complains of discomfort in his bilateral hands.  Bilateral PIP and DIP thickening was noted.  No synovitis was noted.  Plan: XR Hand 2 View Right, XR Hand 2 View Left, x-rays showed bilateral severe osteoarthritis.  Sedimentation rate, Rheumatoid factor, Cyclic citrul peptide antibody, IgG, Uric acid  Chronic pain of both hips -he had limited range of motion of bilateral hip joints.  Plan: XR HIPS BILAT W OR W/O PELVIS 3-4 VIEWS .Bilateral moderate hip joint narrowing was noted   Chronic pain of both knees -he had limited extension of bilateral knee joints.  No warmth swelling or effusion was noted.  Plan: XR KNEE 3 VIEW RIGHT, XR KNEE 3 VIEW LEFT.  Right knee showed moderate osteoarthritis and moderate chondromalacia patella.  Left knee joint showed moderate to severe osteoarthritis, severe chondromalacia patella and chondrocalcinosis.  Chondrocalcinosis of left knee  DDD (degenerative disc disease), cervical -he had limited lateral rotation of the cervical spine.  Patient states that he has been followed by Dr. Arnoldo Morale.  Status post fusion 2021 per patient  DDD (degenerative disc disease), lumbar-he has chronic lower back pain and discomfort.  Status post lumbar spinal fusion - October 20, 2021 L4-L5 fusion by Dr. Arnoldo Morale  Other fatigue -he gives history of fatigue.  Plan: CBC with Differential/Platelet, COMPLETE METABOLIC PANEL WITH GFR  Primary hypertension-blood pressure was elevated today.  Advised him to monitor blood pressure closely and  follow-up with his PCP if the pressure stays elevated.  RBBB  Paroxysmal atrial fibrillation (HCC)  Parkinson's disease (HCC)-he had resting tremors and truncal rigidity.  He is on carbidopa and levodopa.  Gastroesophageal reflux disease without esophagitis-he takes omeprazole.  History of hypothyroidism  Seasonal allergic rhinitis due to pollen   Orders: Orders Placed This Encounter  Procedures   XR Hand 2 View Right   XR Hand 2 View Left   XR KNEE 3 VIEW RIGHT   XR KNEE 3 VIEW LEFT   XR HIPS BILAT W OR W/O PELVIS 3-4 VIEWS   CBC with Differential/Platelet   COMPLETE METABOLIC PANEL WITH GFR   Sedimentation rate   Rheumatoid factor   Cyclic citrul peptide antibody, IgG   Uric acid   No orders of the defined types were placed in this encounter.    Follow-Up Instructions: Return for Pain in multiple joints.   Bo Merino, MD  Note - This record has been created using Editor, commissioning.  Chart creation errors have been sought, but may not always  have been located. Such creation errors do not reflect on  the standard of medical care.

## 2022-01-20 ENCOUNTER — Ambulatory Visit (INDEPENDENT_AMBULATORY_CARE_PROVIDER_SITE_OTHER): Payer: PPO

## 2022-01-20 ENCOUNTER — Ambulatory Visit: Payer: PPO | Attending: Rheumatology | Admitting: Rheumatology

## 2022-01-20 ENCOUNTER — Encounter: Payer: Self-pay | Admitting: Rheumatology

## 2022-01-20 VITALS — BP 143/86 | HR 56 | Resp 17 | Ht 75.0 in | Wt 199.4 lb

## 2022-01-20 DIAGNOSIS — G8929 Other chronic pain: Secondary | ICD-10-CM

## 2022-01-20 DIAGNOSIS — M79641 Pain in right hand: Secondary | ICD-10-CM

## 2022-01-20 DIAGNOSIS — M11262 Other chondrocalcinosis, left knee: Secondary | ICD-10-CM

## 2022-01-20 DIAGNOSIS — M24529 Contracture, unspecified elbow: Secondary | ICD-10-CM

## 2022-01-20 DIAGNOSIS — I4510 Unspecified right bundle-branch block: Secondary | ICD-10-CM

## 2022-01-20 DIAGNOSIS — M2550 Pain in unspecified joint: Secondary | ICD-10-CM

## 2022-01-20 DIAGNOSIS — K219 Gastro-esophageal reflux disease without esophagitis: Secondary | ICD-10-CM

## 2022-01-20 DIAGNOSIS — Z981 Arthrodesis status: Secondary | ICD-10-CM

## 2022-01-20 DIAGNOSIS — M25561 Pain in right knee: Secondary | ICD-10-CM

## 2022-01-20 DIAGNOSIS — M5030 Other cervical disc degeneration, unspecified cervical region: Secondary | ICD-10-CM

## 2022-01-20 DIAGNOSIS — M25551 Pain in right hip: Secondary | ICD-10-CM

## 2022-01-20 DIAGNOSIS — I10 Essential (primary) hypertension: Secondary | ICD-10-CM

## 2022-01-20 DIAGNOSIS — J301 Allergic rhinitis due to pollen: Secondary | ICD-10-CM

## 2022-01-20 DIAGNOSIS — M5136 Other intervertebral disc degeneration, lumbar region: Secondary | ICD-10-CM | POA: Diagnosis not present

## 2022-01-20 DIAGNOSIS — M4807 Spinal stenosis, lumbosacral region: Secondary | ICD-10-CM

## 2022-01-20 DIAGNOSIS — M79642 Pain in left hand: Secondary | ICD-10-CM

## 2022-01-20 DIAGNOSIS — M25562 Pain in left knee: Secondary | ICD-10-CM

## 2022-01-20 DIAGNOSIS — R5383 Other fatigue: Secondary | ICD-10-CM

## 2022-01-20 DIAGNOSIS — I480 Paroxysmal atrial fibrillation: Secondary | ICD-10-CM

## 2022-01-20 DIAGNOSIS — Z8639 Personal history of other endocrine, nutritional and metabolic disease: Secondary | ICD-10-CM

## 2022-01-20 DIAGNOSIS — M25552 Pain in left hip: Secondary | ICD-10-CM

## 2022-01-20 DIAGNOSIS — G20A1 Parkinson's disease without dyskinesia, without mention of fluctuations: Secondary | ICD-10-CM

## 2022-01-20 DIAGNOSIS — M4316 Spondylolisthesis, lumbar region: Secondary | ICD-10-CM

## 2022-01-20 DIAGNOSIS — G4733 Obstructive sleep apnea (adult) (pediatric): Secondary | ICD-10-CM

## 2022-01-20 DIAGNOSIS — M48062 Spinal stenosis, lumbar region with neurogenic claudication: Secondary | ICD-10-CM

## 2022-01-23 LAB — COMPLETE METABOLIC PANEL WITH GFR
AG Ratio: 1.9 (calc) (ref 1.0–2.5)
ALT: 12 U/L (ref 9–46)
AST: 8 U/L — ABNORMAL LOW (ref 10–35)
Albumin: 4.5 g/dL (ref 3.6–5.1)
Alkaline phosphatase (APISO): 78 U/L (ref 35–144)
BUN: 9 mg/dL (ref 7–25)
CO2: 22 mmol/L (ref 20–32)
Calcium: 9.2 mg/dL (ref 8.6–10.3)
Chloride: 97 mmol/L — ABNORMAL LOW (ref 98–110)
Creat: 1.06 mg/dL (ref 0.70–1.28)
Globulin: 2.4 g/dL (calc) (ref 1.9–3.7)
Glucose, Bld: 83 mg/dL (ref 65–99)
Potassium: 4.4 mmol/L (ref 3.5–5.3)
Sodium: 130 mmol/L — ABNORMAL LOW (ref 135–146)
Total Bilirubin: 0.5 mg/dL (ref 0.2–1.2)
Total Protein: 6.9 g/dL (ref 6.1–8.1)
eGFR: 73 mL/min/{1.73_m2} (ref >=60)

## 2022-01-23 LAB — CBC WITH DIFFERENTIAL/PLATELET
Absolute Monocytes: 672 cells/uL (ref 200–950)
Basophils Absolute: 47 cells/uL (ref 0–200)
Basophils Relative: 0.6 %
Eosinophils Absolute: 150 cells/uL (ref 15–500)
Eosinophils Relative: 1.9 %
HCT: 41.4 % (ref 38.5–50.0)
Hemoglobin: 14.2 g/dL (ref 13.2–17.1)
Lymphs Abs: 1825 cells/uL (ref 850–3900)
MCH: 30 pg (ref 27.0–33.0)
MCHC: 34.3 g/dL (ref 32.0–36.0)
MCV: 87.5 fL (ref 80.0–100.0)
MPV: 9.4 fL (ref 7.5–12.5)
Monocytes Relative: 8.5 %
Neutro Abs: 5206 cells/uL (ref 1500–7800)
Neutrophils Relative %: 65.9 %
Platelets: 236 10*3/uL (ref 140–400)
RBC: 4.73 10*6/uL (ref 4.20–5.80)
RDW: 12 % (ref 11.0–15.0)
Total Lymphocyte: 23.1 %
WBC: 7.9 10*3/uL (ref 3.8–10.8)

## 2022-01-23 LAB — RHEUMATOID FACTOR: Rheumatoid fact SerPl-aCnc: <14 IU/mL (ref <14)

## 2022-01-23 LAB — CYCLIC CITRUL PEPTIDE ANTIBODY, IGG: Cyclic Citrullin Peptide Ab: <16 UNITS

## 2022-01-23 LAB — SEDIMENTATION RATE: Sed Rate: 14 mm/h (ref 0–20)

## 2022-01-23 LAB — URIC ACID: Uric Acid, Serum: 6.1 mg/dL (ref 4.0–8.0)

## 2022-01-24 NOTE — Progress Notes (Signed)
Sodium is low and stable.  CBC is normal.  Sed rate normal, RF negative, anti-CCP negative, uric acid is normal.  I will discuss results at the follow-up visit.

## 2022-01-26 ENCOUNTER — Other Ambulatory Visit: Payer: Self-pay | Admitting: Cardiology

## 2022-01-31 NOTE — Progress Notes (Addendum)
Office Visit Note  Patient: [name]             Date of Birth: [birth year]           MRN: [id number]             PCP: Tonia Ghent, MD Referring: Marchia Bond, MD Visit Date: 02/09/2022 Occupation: @GUAROCC @  Subjective:  Pain in multiple joints  History of Present Illness: [name] is a 76 y.o. male with history of osteoarthritis and degenerative disc disease.  He states he continues to have pain and discomfort in his bilateral hands, bilateral hips, bilateral knee joints and his feet.  He has intermittent triggering of his left middle finger for which she gets injections by Dr. Mardelle Matte.  He also gets viscosupplement injections to his knee joints for osteoarthritis.  He has ongoing pain and discomfort in his neck and his lower back.  He states his lower back pain is severe.  He will be getting a cortisone injection soon in his lower back prior to going to [place].  He will be going to [place] for the winter months.  He denies any history of joint swelling.  Activities of Daily Living:  Patient reports morning stiffness for 1-3 hours.   Patient Denies nocturnal pain.  Difficulty dressing/grooming: Reports Difficulty climbing stairs: Denies Difficulty getting out of chair: Reports Difficulty using hands for taps, buttons, cutlery, and/or writing: Reports  Review of Systems  Constitutional:  Negative for fatigue.  HENT:  Negative for mouth sores and mouth dryness.   Eyes:  Negative for dryness.  Respiratory:  Negative for shortness of breath.   Cardiovascular:  Negative for chest pain and palpitations.  Gastrointestinal:  Negative for blood in stool, constipation and diarrhea.  Endocrine: Positive for increased urination.  Genitourinary:  Negative for involuntary urination.  Musculoskeletal:  Positive for joint pain, gait problem, joint pain, myalgias, muscle weakness, morning stiffness, muscle tenderness and myalgias. Negative for joint swelling.  Skin:   Negative for color change, rash, hair loss and sensitivity to sunlight.  Allergic/Immunologic: Negative for susceptible to infections.  Neurological:  Negative for dizziness and headaches.  Hematological:  Negative for swollen glands.  Psychiatric/Behavioral:  Positive for sleep disturbance. Negative for depressed mood. The patient is not nervous/anxious.     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[place])    Atrial fibrillation with RVR ([place]) 04/24/2019   History of recent back surgery 04/24/2019   Body mass index (BMI) 25.0-25.9, adult 04/22/2019   Neural foraminal stenosis of lumbosacral spine 03/31/2019   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   Degenerative scoliosis in adult patient 03/20/2019   Other intervertebral disc displacement, lumbosacral region 03/20/2019   Status post lumbar spinal fusion 02/20/2019   Neck pain 02/04/2019   Acute cough 04/24/2018   Skin lesion 10/31/2017   Lower  urinary tract symptoms (LUTS) 04/17/2016   Oral lesion 02/16/2016   Medicare annual wellness visit, initial 12/01/2015   Advance care planning 12/01/2015   Lower back pain 12/01/2015   OSA on CPAP    HTN (hypertension) 04/23/2015   Spondylolisthesis of lumbar region 12/16/2014   GERD  (gastroesophageal reflux disease) 10/11/2012   RBBB 06/27/2011    Past Medical History:  Diagnosis Date   A-fib ([place])    Arthritis    GERD (gastroesophageal reflux disease)    H/O urinary frequency    Heart murmur    as a child   History of bronchitis    HSV infection    oral   Hypertension    improved after back pain treated with surgery   Insomnia    Insomnia    Improved with Ambien, did not tolerate melatonin.   Joint pain    Parkinson disease    Sleep apnea    no CPAP   Vertigo 10/13/2014   recurrent; trigger is cool air on left ear, improved with daily antihistamine    Family History  Problem Relation Age of Onset   Hypertension Mother    Asthma Mother    Diabetes Father    Stroke Father    Parkinson's disease Father    Cancer Sister    Heart attack Paternal Grandfather        >55   Occular Albinism Son    Hypertension Son    Agricultural engineer Albinism Daughter    Colon cancer Neg Hx    Prostate cancer Neg Hx    Past Surgical History:  Procedure Laterality Date   ACHILLES TENDON REPAIR Left    ALLOGRAFT APPLICATION Left 37/48/2707   Procedure: APPLICATION OF  INTEGRA;  Surgeon: Irene Limbo, MD;  Location: [facility];  Service: Plastics;  Laterality: Left;   BACK SURGERY     COLONOSCOPY  02/2011   Negative,[facility]   DEBRIDEMENT AND CLOSURE WOUND Left 02/25/2018   Procedure: DEBRIDEMENT LEFT LEG APPLICATION INTEGRA;  Surgeon: Irene Limbo, MD;  Location: [facility];  Service: Plastics;  Laterality: Left;   EYE SURGERY Bilateral    cataracts   INCISION AND DRAINAGE OF WOUND Left 11/12/2020   Procedure: DEBRIDEMENT LLE ULCER;  Surgeon: Irene Limbo, MD;  Location: [facility];  Service: Plastics;  Laterality: Left;   KNEE ARTHROSCOPY     L   LUMBAR FUSION     2016   LUMBAR SPINE SURGERY  03/31/2019   exploration of lumbar fusion w/L5-S1 decompression redo L4-5 fusion   SPINE SURGERY  2003    cervical fusion   TONSILLECTOMY     varicoelectomy     Social History   Social History Narrative   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     Immunization History  Administered Date(s) Administered   Pneumococcal Conjugate-13 05/01/2017     Objective: Vital Signs: BP 123/76 (BP Location: Left Arm, Patient Position: Sitting, Cuff Size: Normal)   Pulse 61   Resp 14   Ht $R'6\' 3"'Zh$  (1.905 m)   Wt 201 lb 3.2 oz (91.3 kg)   BMI 25.15 kg/m    Physical Exam Vitals and nursing note reviewed.  Constitutional:      Appearance: He is  well-developed.  HENT:     Head: Normocephalic and atraumatic.  Eyes:     Conjunctiva/sclera: Conjunctivae normal.     Pupils: Pupils are equal, round, and reactive to light.  Cardiovascular:     Rate and Rhythm: Normal rate and regular rhythm.     Heart sounds: Normal heart sounds.  Pulmonary:     Effort: Pulmonary effort is normal.     Breath sounds: Normal breath sounds.  Abdominal:     General: Bowel sounds are normal.     Palpations: Abdomen is soft.  Musculoskeletal:     Cervical back: Normal range of motion and neck supple.  Skin:    General: Skin is warm and dry.     Capillary Refill: Capillary refill takes less than 2 seconds.  Neurological:     Mental Status: He is alert and oriented to person, place, and time.  Psychiatric:        Behavior: Behavior normal.      Musculoskeletal Exam: Limited range of motion of the cervical spine.  He had painful limited range of motion of the lumbar spine.  Shoulder joints were in good range of motion.  Bilateral elbow joint contractures.  He had bilateral CMC PIP and DIP thickening with incomplete fist formation.  He had left middle trigger finger.  He had limited range of motion of bilateral hip joints  with discomfort.  He had limited extension of bilateral knee joints without any warmth swelling or effusion.  He had no tenderness over ankles or MTPs.  CDAI Exam: CDAI Score: -- Patient Global: --; Provider Global: -- Swollen: --; Tender: -- Joint Exam 02/09/2022   No joint exam has been documented for this visit   There is currently no information documented on the homunculus. Go to the Rheumatology activity and complete the homunculus joint exam.  Investigation: No additional findings.  Imaging: XR HIPS BILAT W OR W/O PELVIS 3-4 VIEWS  Result Date: 01/20/2022 Bilateral moderate hip joint narrowing was noted.  No SI joint narrowing with sclerosis was noted.  Hardware was noted in the lumbar spine. Impression: Bilateral moderate osteoarthritis of the hip joints.  XR KNEE 3 VIEW LEFT  Result Date: 01/20/2022 Moderate to severe medial compartment narrowing with intercondylar osteophytes was noted.  Severe patellofemoral narrowing was noted.  Chondrocalcinosis was noted. Impression: These findings are consistent with moderate to severe osteoarthritis, severe chondromalacia patella and chondrocalcinosis.  XR KNEE 3 VIEW RIGHT  Result Date: 01/20/2022 Moderate medial compartment narrowing was noted. Moderate patellofemoral narrowing was noted.  No chondrocalcinosis was noted. Impression: These findings are consistent with moderate osteoarthritis and moderate chondromalacia patella.  XR Hand 2 View Left  Result Date: 01/20/2022 CMC narrowing and spurring was noted.  Narrowing of second MCP joint was noted.  PIP and DIP narrowing was noted.  Irregularity in the shaft of the fifth metacarpal was noted representing most likely an old injury.  No intercarpal or radiocarpal joint space narrowing was noted.  No erosive changes were noted. Impression: These findings are consistent with osteoarthritis of the hand.  XR Hand 2 View Right  Result Date: 01/20/2022 CMC narrowing and spurring was  noted.  PIP and DIP narrowing was noted.  Third MCP narrowing was noted.  No intercarpal or radiocarpal joint space narrowing was noted.  No erosive changes were noted. Impression: These findings are consistent with osteoarthritis of the hand.   Recent Labs: Lab Results  Component Value Date   WBC 7.9 01/20/2022   HGB 14.2  01/20/2022   PLT 236 01/20/2022   NA 130 (L) 01/20/2022   K 4.4 01/20/2022   CL 97 (L) 01/20/2022   CO2 22 01/20/2022   GLUCOSE 83 01/20/2022   BUN 9 01/20/2022   CREATININE 1.06 01/20/2022   BILITOT 0.5 01/20/2022   ALKPHOS 74 07/15/2021   AST 8 (L) 01/20/2022   ALT 12 01/20/2022   PROT 6.9 01/20/2022   ALBUMIN 4.5 07/15/2021   CALCIUM 9.2 01/20/2022   GFRAA >60 05/17/2019      Speciality Comments: No specialty comments available.  Procedures:  No procedures performed Allergies: Flexeril [cyclobenzaprine], Hctz [hydrochlorothiazide], Morphine and related, Phenergan [promethazine], Sinemet [carbidopa-levodopa], and Tambocor [flecainide]   Assessment / Plan:     Visit Diagnoses: Polyarthralgia - History of pain in multiple joints over the last 2 years.January 20, 2022 ESR 14, RF negative, anti-CCP negative, uric acid 6.1.  He had no synovitis on examination.  I discussed lab results with the patient and his wife.  All autoimmune work-up was negative.  Clinical and radiographic findings were consistent with osteoarthritis.  Detailed counseling regarding osteoarthritis was provided.  Joint protection muscle strengthening was discussed.  He takes Tylenol and tramadol on a as needed basis.  He states tramadol makes him drowsy.  I advised him to take tramadol at bedtime and Tylenol during daytime.  Avoidance of NSAIDs was discussed because of the history of gastroesophageal reflux.  I also discussed possible use of Cymbalta for the pain management.  Per pharmacist reviewed Cymbalta with other medications.  There are no drug interactions.  Patient is going to [place] and  will be back in few months.  He would like to started at the follow-up visit.  Contracture of both elbows - Unchanged.  No sign Whitis was noted.  Primary osteoarthritis of both hands - Clinical and radiographic findings are consistent with osteoarthritis.  X-ray findings were discussed with the patient.  Joint protection muscle strengthening was discussed.  A handout on exercises was given.  Trigger finger, left middle finger-he gets the injections by Dr. Mardelle Matte.  Primary osteoarthritis of both hips - Limited range of motion of bilateral hip joints.  X-rays showed bilateral moderate osteoarthritis of the hip.  X-ray findings were discussed with the patient.  He will eventually require bilateral total hip replacement.  Lower extremity muscle strengthening exercises were discussed and a handout was given.  I will also refer him to physical therapy for lower extremity muscle strengthening.  Primary osteoarthritis of both knees -he continues to have pain and discomfort in his bilateral knee joints.  No warmth swelling or effusion was noted.  Right knee joint showed moderate osteoarthritis and moderate chondromalacia patella and left knee joint severe osteoarthritis and severe chondromalacia patella.  Lower extremity muscle strengthening exercises were discussed.  He gets cortisone and Visco supplement injections from the orthopedic surgeon.  I will refer him to physical therapy.  Chondrocalcinosis of left knee-asymptomatic.  He had no warmth swelling or effusion.  DDD (degenerative disc disease), cervical - S/p fusion 2021 by Dr. Arnoldo Morale.  He had limited range of motion with discomfort.  DDD (degenerative disc disease), lumbar - Status post fusion October 20, 2021 L4-L5 by Dr. Arnoldo Morale.  He has chronic pain and discomfort in his lower back.  He is scheduled to have a cortisone injection in the near future.  Primary hypertension-blood pressure was normal today.  Other medical problems are listed as  follows:  Paroxysmal atrial fibrillation ([facility])  RBBB  Gastroesophageal reflux disease  without esophagitis  History of Parkinson's disease  History of hypothyroidism  Seasonal allergic rhinitis due to pollen  Orders: Orders Placed This Encounter  Procedures   Ambulatory referral to Physical Therapy   No orders of the defined types were placed in this encounter.    Follow-Up Instructions: Return in about 6 months (around 08/11/2022) for Osteoarthritis.   Bo Merino, MD  Note - This record has been created using Editor, commissioning.  Chart creation errors have been sought, but may not always  have been located. Such creation errors do not reflect on  the standard of medical care.

## 2022-02-09 ENCOUNTER — Ambulatory Visit: Payer: PPO | Attending: Rheumatology | Admitting: Rheumatology

## 2022-02-09 ENCOUNTER — Encounter: Payer: Self-pay | Admitting: Rheumatology

## 2022-02-09 VITALS — BP 123/76 | HR 61 | Resp 14 | Ht 75.0 in | Wt 201.2 lb

## 2022-02-09 DIAGNOSIS — Z8639 Personal history of other endocrine, nutritional and metabolic disease: Secondary | ICD-10-CM

## 2022-02-09 DIAGNOSIS — M11262 Other chondrocalcinosis, left knee: Secondary | ICD-10-CM

## 2022-02-09 DIAGNOSIS — K219 Gastro-esophageal reflux disease without esophagitis: Secondary | ICD-10-CM

## 2022-02-09 DIAGNOSIS — M5030 Other cervical disc degeneration, unspecified cervical region: Secondary | ICD-10-CM | POA: Diagnosis not present

## 2022-02-09 DIAGNOSIS — M5136 Other intervertebral disc degeneration, lumbar region: Secondary | ICD-10-CM

## 2022-02-09 DIAGNOSIS — M65332 Trigger finger, left middle finger: Secondary | ICD-10-CM

## 2022-02-09 DIAGNOSIS — M19042 Primary osteoarthritis, left hand: Secondary | ICD-10-CM

## 2022-02-09 DIAGNOSIS — I10 Essential (primary) hypertension: Secondary | ICD-10-CM | POA: Diagnosis not present

## 2022-02-09 DIAGNOSIS — M24529 Contracture, unspecified elbow: Secondary | ICD-10-CM | POA: Diagnosis not present

## 2022-02-09 DIAGNOSIS — M2550 Pain in unspecified joint: Secondary | ICD-10-CM

## 2022-02-09 DIAGNOSIS — J301 Allergic rhinitis due to pollen: Secondary | ICD-10-CM

## 2022-02-09 DIAGNOSIS — M160 Bilateral primary osteoarthritis of hip: Secondary | ICD-10-CM | POA: Diagnosis not present

## 2022-02-09 DIAGNOSIS — I480 Paroxysmal atrial fibrillation: Secondary | ICD-10-CM

## 2022-02-09 DIAGNOSIS — I4510 Unspecified right bundle-branch block: Secondary | ICD-10-CM | POA: Diagnosis not present

## 2022-02-09 DIAGNOSIS — M19041 Primary osteoarthritis, right hand: Secondary | ICD-10-CM

## 2022-02-09 DIAGNOSIS — M170 Bilateral primary osteoarthritis of knee: Secondary | ICD-10-CM

## 2022-02-09 DIAGNOSIS — Z8669 Personal history of other diseases of the nervous system and sense organs: Secondary | ICD-10-CM

## 2022-02-09 NOTE — Progress Notes (Signed)
Dr. Estanislado Pandy requested review of med list and interaction check with duloxetine  No significant Grade D drug-drug interactions between current med list and duloxetine.  Knox Saliva, PharmD, MPH, BCPS, CPP Clinical Pharmacist (Rheumatology and Pulmonology)

## 2022-02-09 NOTE — Patient Instructions (Signed)
Hand Exercises Hand exercises can be helpful for almost anyone. These exercises can strengthen the hands, improve flexibility and movement, and increase blood flow to the hands. These results can make work and daily tasks easier. Hand exercises can be especially helpful for people who have joint pain from arthritis or have nerve damage from overuse (carpal tunnel syndrome). These exercises can also help people who have injured a hand. Exercises Most of these hand exercises are gentle stretching and motion exercises. It is usually safe to do them often throughout the day. Warming up your hands before exercise may help to reduce stiffness. You can do this with gentle massage or by placing your hands in warm water for 10-15 minutes. It is normal to feel some stretching, pulling, tightness, or mild discomfort as you begin new exercises. This will gradually improve. Stop an exercise right away if you feel sudden, severe pain or your pain gets worse. Ask your health care provider which exercises are best for you. Knuckle bend or "claw" fist  Stand or sit with your arm, hand, and all five fingers pointed straight up. Make sure to keep your wrist straight during the exercise. Gently bend your fingers down toward your palm until the tips of your fingers are touching the top of your palm. Keep your big knuckle straight and just bend the small knuckles in your fingers. Hold this position for __________ seconds. Straighten (extend) your fingers back to the starting position. Repeat this exercise 5-10 times with each hand. Full finger fist  Stand or sit with your arm, hand, and all five fingers pointed straight up. Make sure to keep your wrist straight during the exercise. Gently bend your fingers into your palm until the tips of your fingers are touching the middle of your palm. Hold this position for __________ seconds. Extend your fingers back to the starting position, stretching every joint fully. Repeat  this exercise 5-10 times with each hand. Straight fist Stand or sit with your arm, hand, and all five fingers pointed straight up. Make sure to keep your wrist straight during the exercise. Gently bend your fingers at the big knuckle, where your fingers meet your hand, and the middle knuckle. Keep the knuckle at the tips of your fingers straight and try to touch the bottom of your palm. Hold this position for __________ seconds. Extend your fingers back to the starting position, stretching every joint fully. Repeat this exercise 5-10 times with each hand. Tabletop  Stand or sit with your arm, hand, and all five fingers pointed straight up. Make sure to keep your wrist straight during the exercise. Gently bend your fingers at the big knuckle, where your fingers meet your hand, as far down as you can while keeping the small knuckles in your fingers straight. Think of forming a tabletop with your fingers. Hold this position for __________ seconds. Extend your fingers back to the starting position, stretching every joint fully. Repeat this exercise 5-10 times with each hand. Finger spread  Place your hand flat on a table with your palm facing down. Make sure your wrist stays straight as you do this exercise. Spread your fingers and thumb apart from each other as far as you can until you feel a gentle stretch. Hold this position for __________ seconds. Bring your fingers and thumb tight together again. Hold this position for __________ seconds. Repeat this exercise 5-10 times with each hand. Making circles  Stand or sit with your arm, hand, and all five fingers pointed   straight up. Make sure to keep your wrist straight during the exercise. Make a circle by touching the tip of your thumb to the tip of your index finger. Hold for __________ seconds. Then open your hand wide. Repeat this motion with your thumb and each finger on your hand. Repeat this exercise 5-10 times with each hand. Thumb  motion  Sit with your forearm resting on a table and your wrist straight. Your thumb should be facing up toward the ceiling. Keep your fingers relaxed as you move your thumb. Lift your thumb up as high as you can toward the ceiling. Hold for __________ seconds. Bend your thumb across your palm as far as you can, reaching the tip of your thumb for the small finger (pinkie) side of your palm. Hold for __________ seconds. Repeat this exercise 5-10 times with each hand. Grip strengthening  Hold a stress ball or other soft ball in the middle of your hand. Slowly increase the pressure, squeezing the ball as much as you can without causing pain. Think of bringing the tips of your fingers into the middle of your palm. All of your finger joints should bend when doing this exercise. Hold your squeeze for __________ seconds, then relax. Repeat this exercise 5-10 times with each hand. Contact a health care provider if: Your hand pain or discomfort gets much worse when you do an exercise. Your hand pain or discomfort does not improve within 2 hours after you exercise. If you have any of these problems, stop doing these exercises right away. Do not do them again unless your health care provider says that you can. Get help right away if: You develop sudden, severe hand pain or swelling. If this happens, stop doing these exercises right away. Do not do them again unless your health care provider says that you can. This information is not intended to replace advice given to you by your health care provider. Make sure you discuss any questions you have with your health care provider. Document Revised: 07/22/2020 Document Reviewed: 07/22/2020 Else[employer] Patient Education  [place]. Hip Exercises Ask your health care provider which exercises are safe for you. Do exercises exactly as told by your health care provider and adjust them as directed. It is normal to feel mild stretching, pulling, tightness, or  discomfort as you do these exercises. Stop right away if you feel sudden pain or your pain gets worse. Do not begin these exercises until told by your health care provider. Stretching and range-of-motion exercises These exercises warm up your muscles and joints and improve the movement and flexibility of your hip. These exercises also help to relieve pain, numbness, and tingling. You may be asked to limit your range of motion if you had a hip replacement. Talk to your health care provider about these restrictions. Hamstrings, supine  Lie on your back (supine position). Loop a belt or towel over the ball of your left / right foot. The ball of your foot is on the walking surface, right under your toes. Straighten your left / right knee and slowly pull on the belt or towel to raise your leg until you feel a gentle stretch behind your knee (hamstring). Do not let your knee bend while you do this. Keep your other leg flat on the floor. Hold this position for __________ seconds. Slowly return your leg to the starting position. Repeat __________ times. Complete this exercise __________ times a day. Hip rotation  Lie on your back on a firm  surface. With your left / right hand, gently pull your left / right knee toward the shoulder that is on the same side of the body. Stop when your knee is pointing toward the ceiling. Hold your left / right ankle with your other hand. Keeping your knee steady, gently pull your left / right ankle toward your other shoulder until you feel a stretch in your buttocks. Keep your hips and shoulders firmly planted while you do this stretch. Hold this position for __________ seconds. Repeat __________ times. Complete this exercise __________ times a day. Seated stretch This exercise is sometimes called hamstrings and adductors stretch. Sit on the floor with your legs stretched wide. Keep your knees straight during this exercise. Keeping your head and back in a straight  line, bend at your waist to reach for your left foot (position A). You should feel a stretch in your right inner thigh (adductors). Hold this position for __________ seconds. Then slowly return to the upright position. Keeping your head and back in a straight line, bend at your waist to reach forward (position B). You should feel a stretch behind both of your thighs and knees (hamstrings). Hold this position for __________ seconds. Then slowly return to the upright position. Keeping your head and back in a straight line, bend at your waist to reach for your right foot (position C). You should feel a stretch in your left inner thigh (adductors). Hold this position for __________ seconds. Then slowly return to the upright position. Repeat __________ times. Complete this exercise __________ times a day. Lunge This exercise stretches the muscles of the hip (hip flexors). Place your left / right knee on the floor and bend your other knee so that is directly over your ankle. You should be half-kneeling. Keep good posture with your head over your shoulders. Tighten your buttocks to point your tailbone downward. This will prevent your back from arching too much. You should feel a gentle stretch in the front of your left / right thigh and hip. If you do not feel a stretch, slide your other foot forward slightly and then slowly lunge forward with your chest up until your knee once again lines up over your ankle. Make sure your tailbone continues to point downward. Hold this position for __________ seconds. Slowly return to the starting position. Repeat __________ times. Complete this exercise __________ times a day. Strengthening exercises These exercises build strength and endurance in your hip. Endurance is the ability to use your muscles for a long time, even after they get tired. Bridge This exercise strengthens the muscles of your hip (hip extensors). Lie on your back on a firm surface with your  knees bent and your feet flat on the floor. Tighten your buttocks muscles and lift your bottom off the floor until the trunk of your body and your hips are level with your thighs. Do not arch your back. You should feel the muscles working in your buttocks and the back of your thighs. If you do not feel these muscles, slide your feet 1-2 inches (2.5-5 cm) farther away from your buttocks. Hold this position for __________ seconds. Slowly lower your hips to the starting position. Let your muscles relax completely between repetitions. Repeat __________ times. Complete this exercise __________ times a day. Straight leg raises, side-lying This exercise strengthens the muscles that move the hip joint away from the center of the body (hip abductors). Lie on your side with your left / right leg in the top position.  Lie so your head, shoulder, hip, and knee line up. You may bend your bottom knee slightly to help you balance. Roll your hips slightly forward, so your hips are stacked directly over each other and your left / right knee is facing forward. Leading with your heel, lift your top leg 4-6 inches (10-15 cm). You should feel the muscles in your top hip lifting. Do not let your foot drift forward. Do not let your knee roll toward the ceiling. Hold this position for __________ seconds. Slowly return to the starting position. Let your muscles relax completely between repetitions. Repeat __________ times. Complete this exercise __________ times a day. Straight leg raises, side-lying This exercise strengthens the muscles that move the hip joint toward the center of the body (hip adductors). Lie on your side with your left / right leg in the bottom position. Lie so your head, shoulder, hip, and knee line up. You may place your upper foot in front to help you balance. Roll your hips slightly forward, so your hips are stacked directly over each other and your left / right knee is facing forward. Tense the  muscles in your inner thigh and lift your bottom leg 4-6 inches (10-15 cm). Hold this position for __________ seconds. Slowly return to the starting position. Let your muscles relax completely between repetitions. Repeat __________ times. Complete this exercise __________ times a day. Straight leg raises, supine This exercise strengthens the muscles in the front of your thigh (quadriceps). Lie on your back (supine position) with your left / right leg extended and your other knee bent. Tense the muscles in the front of your left / right thigh. You should see your kneecap slide up or see increased dimpling just above your knee. Keep these muscles tight as you raise your leg 4-6 inches (10-15 cm) off the floor. Do not let your knee bend. Hold this position for __________ seconds. Keep these muscles tense as you lower your leg. Relax the muscles slowly and completely between repetitions. Repeat __________ times. Complete this exercise __________ times a day. Hip abductors, standing This exercise strengthens the muscles that move the leg and hip joint away from the center of the body (hip abductors). Tie one end of a rubber exercise band or tubing to a secure surface, such as a chair, table, or pole. Loop the other end of the band or tubing around your left / right ankle. Keeping your ankle with the band or tubing directly opposite the secured end, step away until there is tension in the tubing or band. Hold on to a chair, table, or pole as needed for balance. Lift your left / right leg out to your side. While you do this: Keep your back upright. Keep your shoulders over your hips. Keep your toes pointing forward. Make sure to use your hip muscles to slowly lift your leg. Do not tip your body or forcefully lift your leg. Hold this position for __________ seconds. Slowly return to the starting position. Repeat __________ times. Complete this exercise __________ times a day. Squats This exercise  strengthens the muscles in the front of your thigh (quadriceps). Stand in a door frame so your feet and knees are in line with the frame. You may place your hands on the frame for balance. Slowly bend your knees and lower your hips like you are going to sit in a chair. Keep your lower legs in a straight-up-and-down position. Do not let your hips go lower than your knees. Do not  bend your knees lower than told by your health care provider. If your hip pain increases, do not bend as low. Hold this position for ___________ seconds. Slowly push with your legs to return to standing. Do not use your hands to pull yourself to standing. Repeat __________ times. Complete this exercise __________ times a day. This information is not intended to replace advice given to you by your health care provider. Make sure you discuss any questions you have with your health care provider. Document Revised: 08/18/2020 Document Reviewed: 08/18/2020 Elsevier Patient Education  [place]. Exercises for Chronic Knee Pain Chronic knee pain is pain that lasts longer than 3 months. For most people with chronic knee pain, exercise and weight loss is an important part of treatment. Your health care provider may want you to focus on: Strengthening the muscles that support your knee. This can take pressure off your knee and lessen pain. Preventing knee stiffness. Maintaining or increasing how far you can move your knee. Losing weight (if this applies) to take pressure off your knee, decrease your risk for injury, and make it easier for you to exercise. Your health care provider will help you develop an exercise program that matches your needs and physical abilities. Below are simple, low-impact exercises you can do at home. Ask your health care provider or a physical therapist how often you should do your exercise program and how many times to repeat each exercise. General safety tips Follow these safety tips for  exercising with chronic knee pain: Get your health care provider's approval before doing any exercises. Start slowly and stop any time an exercise causes pain. Do not exercise if your knee pain is flaring up. Warm up first. Stretching a cold muscle can cause an injury. Do 5-10 minutes of easy movement or light stretching before beginning your exercise routine. Do 5-10 minutes of low-impact activity (like walking or cycling) before starting strengthening exercises. Contact your health care provider any time you have pain during or after exercising. Exercise may cause discomfort but should not be painful. It is normal to be a little stiff or sore after exercising.  Stretching and range-of-motion exercises Front thigh stretch  Stand up straight and support your body by holding on to a chair or resting one hand on a wall. With your legs straight and close together, bend one knee to lift your heel up toward your buttocks. Using one hand for support, grab your ankle with your free hand. Pull your foot up closer toward your buttocks to feel the stretch in front of your thigh. Hold the stretch for 30 seconds. Repeat __________ times. Complete this exercise __________ times a day. Back thigh stretch  Sit on the floor with your back straight and your legs out straight in front of you. Place the palms of your hands on the floor and slide them toward your feet as you bend at the hip. Try to touch your nose to your knees and feel the stretch in the back of your thighs. Hold for 30 seconds. Repeat __________ times. Complete this exercise __________ times a day. Calf stretch  Stand facing a wall. Place the palms of your hands flat against the wall, arms extended, and lean slightly against the wall. Get into a lunge position with one leg bent at the knee and the other leg stretched out straight behind you. Keep both feet facing the wall and increase the bend in your knee while keeping the heel of the  other leg  flat on the ground. You should feel the stretch in your calf. Hold for 30 seconds. Repeat __________ times. Complete this exercise __________ times a day. Strengthening exercises Straight leg lift Lie on your back with one knee bent and the other leg out straight. Slowly lift the straight leg without bending the knee. Lift until your foot is about 12 inches (30 cm) off the floor. Hold for 3-5 seconds and slowly lower your leg. Repeat __________ times. Complete this exercise __________ times a day. Single leg dip Stand between two chairs and put both hands on the backs of the chairs for support. Extend one leg out straight with your body weight resting on the heel of the standing leg. Slowly bend your standing knee to dip your body to the level that is comfortable for you. Hold for 3-5 seconds. Repeat __________ times. Complete this exercise __________ times a day. Hamstring curls Stand straight, knees close together, facing the back of a chair. Hold on to the back of a chair with both hands. Keep one leg straight. Bend the other knee while bringing the heel up toward the buttock until the knee is bent at a 90-degree angle (right angle). Hold for 3-5 seconds. Repeat __________ times. Complete this exercise __________ times a day. Wall squat Stand straight with your back, hips, and head against a wall. Step forward one foot at a time with your back still against the wall. Your feet should be 2 feet (61 cm) from the wall at shoulder width. Keeping your back, hips, and head against the wall, slide down the wall to as close of a sitting position as you can get. Hold for 5-10 seconds, then slowly slide back up. Repeat __________ times. Complete this exercise __________ times a day. Step-ups Step up with one foot onto a sturdy platform or stool that is about 6 inches (15 cm) high. Face sideways with one foot on the platform and one on the ground. Place all your weight on the  platform foot and lift your body off the ground until your knee extends. Let your other leg hang free to the side. Hold for 3-5 seconds then slowly lower your weight down to the floor foot. Repeat __________ times. Complete this exercise __________ times a day. Contact a health care provider if: Your exercise causes pain. Your pain is worse after you exercise. Your pain prevents you from doing your exercises. This information is not intended to replace advice given to you by your health care provider. Make sure you discuss any questions you have with your health care provider. Document Revised: 08/07/2019 Document Reviewed: 03/31/2019 Elsevier Patient Education  [place]. Back Exercises The following exercises strengthen the muscles that help to support the trunk (torso) and back. They also help to keep the lower back flexible. Doing these exercises can help to prevent or lessen existing low back pain. If you have back pain or discomfort, try doing these exercises 2-3 times each day or as told by your health care provider. As your pain improves, do them once each day, but increase the number of times that you repeat the steps for each exercise (do more repetitions). To prevent the recurrence of back pain, continue to do these exercises once each day or as told by your health care provider. Do exercises exactly as told by your health care provider and adjust them as directed. It is normal to feel mild stretching, pulling, tightness, or discomfort as you do these exercises, but you should  stop right away if you feel sudden pain or your pain gets worse. Exercises Single knee to chest Repeat these steps 3-5 times for each leg: Lie on your back on a firm bed or the floor with your legs extended. Bring one knee to your chest. Your other leg should stay extended and in contact with the floor. Hold your knee in place by grabbing your knee or thigh with both hands and hold. Pull on your knee  until you feel a gentle stretch in your lower back or buttocks. Hold the stretch for 10-30 seconds. Slowly release and straighten your leg.  Pelvic tilt Repeat these steps 5-10 times: Lie on your back on a firm bed or the floor with your legs extended. Bend your knees so they are pointing toward the ceiling and your feet are flat on the floor. Tighten your lower abdominal muscles to press your lower back against the floor. This motion will tilt your pelvis so your tailbone points up toward the ceiling instead of pointing to your feet or the floor. With gentle tension and even breathing, hold this position for 5-10 seconds.  Cat-cow Repeat these steps until your lower back becomes more flexible: Get into a hands-and-knees position on a firm bed or the floor. Keep your hands under your shoulders, and keep your knees under your hips. You may place padding under your knees for comfort. Let your head hang down toward your chest. Contract your abdominal muscles and point your tailbone toward the floor so your lower back becomes rounded like the back of a cat. Hold this position for 5 seconds. Slowly lift your head, let your abdominal muscles relax, and point your tailbone up toward the ceiling so your back forms a sagging arch like the back of a cow. Hold this position for 5 seconds.  Press-ups Repeat these steps 5-10 times: Lie on your abdomen (face-down) on a firm bed or the floor. Place your palms near your head, about shoulder-width apart. Keeping your back as relaxed as possible and keeping your hips on the floor, slowly straighten your arms to raise the top half of your body and lift your shoulders. Do not use your back muscles to raise your upper torso. You may adjust the placement of your hands to make yourself more comfortable. Hold this position for 5 seconds while you keep your back relaxed. Slowly return to lying flat on the floor.  Bridges Repeat these steps 10 times: Lie on your  back on a firm bed or the floor. Bend your knees so they are pointing toward the ceiling and your feet are flat on the floor. Your arms should be flat at your sides, next to your body. Tighten your buttocks muscles and lift your buttocks off the floor until your waist is at almost the same height as your knees. You should feel the muscles working in your buttocks and the back of your thighs. If you do not feel these muscles, slide your feet 1-2 inches (2.5-5 cm) farther away from your buttocks. Hold this position for 3-5 seconds. Slowly lower your hips to the starting position, and allow your buttocks muscles to relax completely. If this exercise is too easy, try doing it with your arms crossed over your chest. Abdominal crunches Repeat these steps 5-10 times: Lie on your back on a firm bed or the floor with your legs extended. Bend your knees so they are pointing toward the ceiling and your feet are flat on the floor. Cross your  arms over your chest. Tip your chin slightly toward your chest without bending your neck. Tighten your abdominal muscles and slowly raise your torso high enough to lift your shoulder blades a tiny bit off the floor. Avoid raising your torso higher than that because it can put too much stress on your lower back and does not help to strengthen your abdominal muscles. Slowly return to your starting position.  Back lifts Repeat these steps 5-10 times: Lie on your abdomen (face-down) with your arms at your sides, and rest your forehead on the floor. Tighten the muscles in your legs and your buttocks. Slowly lift your chest off the floor while you keep your hips pressed to the floor. Keep the back of your head in line with the curve in your back. Your eyes should be looking at the floor. Hold this position for 3-5 seconds. Slowly return to your starting position.  Contact a health care provider if: Your back pain or discomfort gets much worse when you do an exercise. Your  worsening back pain or discomfort does not lessen within 2 hours after you exercise. If you have any of these problems, stop doing these exercises right away. Do not do them again unless your health care provider says that you can. Get help right away if: You develop sudden, severe back pain. If this happens, stop doing the exercises right away. Do not do them again unless your health care provider says that you can. This information is not intended to replace advice given to you by your health care provider. Make sure you discuss any questions you have with your health care provider. Document Revised: 09/28/2020 Document Reviewed: 06/16/2020 Elsevier Patient Education  [place].

## 2022-02-22 ENCOUNTER — Other Ambulatory Visit: Payer: Self-pay | Admitting: Neurology

## 2022-02-22 DIAGNOSIS — G20A1 Parkinson's disease without dyskinesia, without mention of fluctuations: Secondary | ICD-10-CM

## 2022-02-23 NOTE — Progress Notes (Signed)
Assessment/Plan:   1.  Parkinsons Disease with levodopa resistant tremor   -Continue pramipexole 0.5 mg three times per day  -continue Rytary 195 tid.    -discussed dbs and focused ultrasound.  He is interested in focused ultrasound.  We will discuss more next visit  2.  Atrial fibrillation hx  -off Xarelto for quite some time due to no recurrence of a-fib, which occurred post op.  Follows with Dr. Einar Gip  3.  Insomnia  -Continue mirtazapine, 15 mg at bed   4.  Back pain  -Sees Dr. Arnoldo Morale. Pt had surgery in July  5.  Constipation  -discussed nature and pathophysiology and association with PD  -discussed importance of hydration.  Pt is to increase water intake  -pt is given a copy of the rancho recipe  -recommended daily colace  -recommended miralax prn    Subjective:   [name] was seen today in follow up for Parkinsons disease.  My previous records were reviewed prior to todays visit as well as outside records available to me.   Wife with patient who supplements the history.  Last visit, we decided to start levodopa, with the caution that it may not help with tremor.  We did this in part because he wanted to consider DBS in the future.  He ended up having nausea with this version and we stopped that and changed to the extended release.  This, too, made him nauseated.  We ended up giving him samples of low-dose Rytary, 95 mg 3 times per day.  He tolerated this, but did call me to tell me it did not really help tremor specifically.  He was worked up to 195 mg tid.  He still c/o tremor.  He has trouble on the phone.  No falls.  Walks "slower."  No hallucinations.  He hasn't taken any medication this AM.  Current prescribed movement disorder medications: Pramipexole 0.5 mg three times per day Rytary 195 mg, 1 capsule 3 times per day Mirtazapine, 15 mg at bed Propranolol 20 mg bid  PREVIOUS MEDICATIONS: Levodopa IR (nausea); carbidopa/levodopa 25/100 CR  (nausea)  ALLERGIES:   Allergies  Allergen Reactions   Flexeril [Cyclobenzaprine] Other (See Comments)    Constipation   Hctz [Hydrochlorothiazide]     hyponatremia   Morphine And Related Hives    Hallucinations- can tolerate oxycodone.    Phenergan [Promethazine] Other (See Comments)    Would avoid due to Parkinsons   Sinemet [Carbidopa-Levodopa]     intolerant   Tambocor [Flecainide] Nausea And Vomiting    CURRENT MEDICATIONS:  Outpatient Encounter Medications as of 02/28/2022  Medication Sig   acetaminophen (TYLENOL) 650 MG CR tablet Take 1,300 mg by mouth daily as needed for pain.   Carbidopa-Levodopa ER (RYTARY) 48.75-195 MG CPCR Samples of this drug were given to the patient, quantity 1, Lot Number [id number] Exp 01/2023   diclofenac Sodium (VOLTAREN) 1 % GEL Apply 1 Application topically 4 (four) times daily as needed (pain.).   diltiazem (CARDIZEM CD) 180 MG 24 hr capsule TAKE 1 CAPSULE BY MOUTH EVERY DAY   docusate sodium (COLACE) 100 MG capsule Take 1 capsule (100 mg total) by mouth 2 (two) times daily. (Patient taking differently: Take 100 mg by mouth as needed.)   fluticasone (FLONASE) 50 MCG/ACT nasal spray Place 2 sprays into both nostrils daily. (Patient taking differently: Place 2 sprays into both nostrils in the morning and at bedtime.)   omeprazole (PRILOSEC) 20 MG capsule Take 20 mg  by mouth daily before breakfast.   polyethylene glycol powder (GLYCOLAX/MIRALAX) 17 GM/SCOOP powder Take 17 g by mouth 2 (two) times daily as needed for moderate constipation.   pramipexole (MIRAPEX) 0.5 MG tablet TAKE 1 TABLET(0.5 MG) BY MOUTH THREE TIMES DAILY   propranolol (INDERAL) 20 MG tablet TAKE 1 TABLET BY MOUTH TWICE A DAY   tiZANidine (ZANAFLEX) 4 MG tablet Take 1 tablet (4 mg total) by mouth every 8 (eight) hours as needed for muscle spasms.   traMADol (ULTRAM) 50 MG tablet TAKE 1 TABLET (50 MG TOTAL) BY MOUTH EVERY 12 (TWELVE) HOURS AS NEEDED.   Carbidopa-Levodopa ER  (RYTARY) 23.75-95 MG CPCR Samples of this drug were given to the patient, quantity 1, Lot Number [id number] A EXP 01/24 (Patient not taking: Reported on 02/09/2022)   Carbidopa-Levodopa ER (RYTARY) 36.25-145 MG CPCR Samples of this drug were given to the patient, quantity 2, Lot Number [id number] 02/2023 (Patient not taking: Reported on 02/28/2022)   potassium chloride (KLOR-CON) 10 MEQ tablet TAKE 1 TABLET BY MOUTH TWICE A DAY (Patient not taking: Reported on 01/20/2022)   No facility-administered encounter medications on file as of 02/28/2022.    Objective:   PHYSICAL EXAMINATION:    VITALS:   Vitals:   02/28/22 0835  BP: (!) 140/76  Pulse: (!) 58  SpO2: 97%  Height: '6\' 3"'$  (1.905 m)       GEN:  The patient appears stated age and is in NAD. HEENT:  Normocephalic, atraumatic.  The mucous membranes are moist. The superficial temporal arteries are without ropiness or tenderness.   Neurological examination:  Orientation: The patient is alert and oriented x3. Cranial nerves: There is good facial symmetry with facial hypomimia. The speech is fluent and clear. Soft palate rises symmetrically and there is no tongue deviation. Hearing is intact to conversational tone. Sensation: Sensation is intact to light touch throughout Motor: Strength is at least antigravity x4.  HE HAS TAKEN NO MEDICATION THIS MORNING.  Movement examination: Tone: There is nl tone in the UE/LE Abnormal movements: he has near constant RUE rest tremor, mild Coordination:  There is mild decremation with finger taps on the R.  He has slow toe taps on the L (due to prior sx) Gait and Station: The patient has no difficulty arising out of a deep-seated chair without the use of the hands. The patient's stride length is decreased and he is forward flexed (he is having back pain).  He is shuffling more.  Hand behind back on the R  I have reviewed and interpreted the following labs independently    Chemistry       Component Value Date/Time   NA 130 (L) 01/20/2022 0858   NA 139 07/01/2020 1021   K 4.4 01/20/2022 0858   CL 97 (L) 01/20/2022 0858   CO2 22 01/20/2022 0858   BUN 9 01/20/2022 0858   BUN 12 07/01/2020 1021   CREATININE 1.06 01/20/2022 0858      Component Value Date/Time   CALCIUM 9.2 01/20/2022 0858   ALKPHOS 74 07/15/2021 1230   AST 8 (L) 01/20/2022 0858   ALT 12 01/20/2022 0858   BILITOT 0.5 01/20/2022 0858       Lab Results  Component Value Date   WBC 7.9 01/20/2022   HGB 14.2 01/20/2022   HCT 41.4 01/20/2022   MCV 87.5 01/20/2022   PLT 236 01/20/2022    Lab Results  Component Value Date   TSH 3.50 07/15/2021   Total time spent  on today's visit was 35 minutes, including both face-to-face time and nonface-to-face time.  Time included that spent on review of records (prior notes available to me/labs/imaging if pertinent), discussing treatment and goals, answering patient's questions and coordinating care.   Cc:  Tonia Ghent, MD

## 2022-02-24 DIAGNOSIS — M65331 Trigger finger, right middle finger: Secondary | ICD-10-CM | POA: Diagnosis not present

## 2022-02-24 DIAGNOSIS — M65332 Trigger finger, left middle finger: Secondary | ICD-10-CM | POA: Diagnosis not present

## 2022-02-24 DIAGNOSIS — M48062 Spinal stenosis, lumbar region with neurogenic claudication: Secondary | ICD-10-CM | POA: Diagnosis not present

## 2022-02-28 ENCOUNTER — Encounter: Payer: Self-pay | Admitting: Neurology

## 2022-02-28 ENCOUNTER — Ambulatory Visit (INDEPENDENT_AMBULATORY_CARE_PROVIDER_SITE_OTHER): Payer: PPO | Admitting: Neurology

## 2022-02-28 VITALS — BP 140/76 | HR 58 | Ht 75.0 in | Wt 200.0 lb

## 2022-02-28 DIAGNOSIS — G20B1 Parkinson's disease with dyskinesia, without mention of fluctuations: Secondary | ICD-10-CM | POA: Diagnosis not present

## 2022-02-28 MED ORDER — RYTARY 48.75-195 MG PO CPCR: ORAL_CAPSULE | ORAL | 0 refills | Status: DC

## 2022-02-28 NOTE — Patient Instructions (Signed)
Constipation and Parkinson's disease:  1.Rancho recipe for constipation in Parkinsons Disease:  -1 cup of unprocessed bran (need to get this at [employer], [employer] or similar type of store), 2 cups of applesauce in 1 cup of prune juice 2.  Increase fiber intake (Metamucil,vegetables) 3.  Regular, moderate exercise can be beneficial. 4.  Avoid medications causing constipation, such as medications like antacids with calcium or magnesium 5.  It's okay to take daily Miralax, and taper if stools become too loose or you experience diarrhea 6.  Stool softeners (Colace) can help with chronic constipation and I recommend you take this daily. 7.  Increase water intake.  You should be drinking 1/2 gallon of water a day as long as you have not been diagnosed with congestive heart failure or renal/kidney failure.  This is probably the single greatest thing that you can do to help your constipation.

## 2022-03-20 DIAGNOSIS — F1120 Opioid dependence, uncomplicated: Secondary | ICD-10-CM | POA: Diagnosis not present

## 2022-03-20 DIAGNOSIS — M48062 Spinal stenosis, lumbar region with neurogenic claudication: Secondary | ICD-10-CM | POA: Diagnosis not present

## 2022-03-24 DIAGNOSIS — M7661 Achilles tendinitis, right leg: Secondary | ICD-10-CM | POA: Diagnosis not present

## 2022-03-24 DIAGNOSIS — M7662 Achilles tendinitis, left leg: Secondary | ICD-10-CM | POA: Diagnosis not present

## 2022-03-24 DIAGNOSIS — M170 Bilateral primary osteoarthritis of knee: Secondary | ICD-10-CM | POA: Diagnosis not present

## 2022-03-31 DIAGNOSIS — M170 Bilateral primary osteoarthritis of knee: Secondary | ICD-10-CM | POA: Diagnosis not present

## 2022-03-31 DIAGNOSIS — M7662 Achilles tendinitis, left leg: Secondary | ICD-10-CM | POA: Diagnosis not present

## 2022-03-31 DIAGNOSIS — M7661 Achilles tendinitis, right leg: Secondary | ICD-10-CM | POA: Diagnosis not present

## 2022-04-07 DIAGNOSIS — M170 Bilateral primary osteoarthritis of knee: Secondary | ICD-10-CM | POA: Diagnosis not present

## 2022-04-09 ENCOUNTER — Other Ambulatory Visit: Payer: Self-pay | Admitting: Cardiology

## 2022-04-09 DIAGNOSIS — I10 Essential (primary) hypertension: Secondary | ICD-10-CM

## 2022-04-13 ENCOUNTER — Other Ambulatory Visit: Payer: Self-pay

## 2022-04-13 DIAGNOSIS — I10 Essential (primary) hypertension: Secondary | ICD-10-CM

## 2022-04-13 MED ORDER — PROPRANOLOL HCL 20 MG PO TABS: 20.0000 mg | ORAL_TABLET | Freq: Two times a day (BID) | ORAL | 0 refills | Status: DC

## 2022-04-13 MED ORDER — DILTIAZEM HCL ER COATED BEADS 180 MG PO CP24: 180.0000 mg | ORAL_CAPSULE | Freq: Every day | ORAL | 0 refills | Status: DC

## 2022-05-19 ENCOUNTER — Other Ambulatory Visit: Payer: Self-pay | Admitting: Neurology

## 2022-05-19 DIAGNOSIS — G20A1 Parkinson's disease without dyskinesia, without mention of fluctuations: Secondary | ICD-10-CM

## 2022-06-09 ENCOUNTER — Other Ambulatory Visit: Payer: Self-pay | Admitting: Neurology

## 2022-06-09 DIAGNOSIS — G20A1 Parkinson's disease without dyskinesia, without mention of fluctuations: Secondary | ICD-10-CM

## 2022-06-27 ENCOUNTER — Ambulatory Visit
Admission: EM | Admit: 2022-06-27 | Discharge: 2022-06-27 | Disposition: A | Discharge status: Home / Self Care | Payer: PPO | Source: Home / Self Care

## 2022-06-27 ENCOUNTER — Telehealth: Payer: PPO | Admitting: Physician Assistant

## 2022-06-27 DIAGNOSIS — Z1152 Encounter for screening for COVID-19: Secondary | ICD-10-CM | POA: Diagnosis not present

## 2022-06-27 DIAGNOSIS — K529 Noninfective gastroenteritis and colitis, unspecified: Secondary | ICD-10-CM | POA: Diagnosis not present

## 2022-06-27 DIAGNOSIS — N401 Enlarged prostate with lower urinary tract symptoms: Secondary | ICD-10-CM | POA: Diagnosis not present

## 2022-06-27 DIAGNOSIS — R109 Unspecified abdominal pain: Secondary | ICD-10-CM | POA: Diagnosis not present

## 2022-06-27 DIAGNOSIS — G4700 Insomnia, unspecified: Secondary | ICD-10-CM | POA: Diagnosis not present

## 2022-06-27 DIAGNOSIS — R0602 Shortness of breath: Secondary | ICD-10-CM | POA: Diagnosis not present

## 2022-06-27 DIAGNOSIS — R112 Nausea with vomiting, unspecified: Secondary | ICD-10-CM | POA: Diagnosis not present

## 2022-06-27 DIAGNOSIS — R110 Nausea: Secondary | ICD-10-CM | POA: Diagnosis not present

## 2022-06-27 DIAGNOSIS — E860 Dehydration: Secondary | ICD-10-CM | POA: Diagnosis not present

## 2022-06-27 DIAGNOSIS — N3001 Acute cystitis with hematuria: Secondary | ICD-10-CM | POA: Diagnosis not present

## 2022-06-27 DIAGNOSIS — R300 Dysuria: Secondary | ICD-10-CM | POA: Diagnosis not present

## 2022-06-27 DIAGNOSIS — G8929 Other chronic pain: Secondary | ICD-10-CM | POA: Diagnosis not present

## 2022-06-27 DIAGNOSIS — T428X6A Underdosing of antiparkinsonism drugs and other central muscle-tone depressants, initial encounter: Secondary | ICD-10-CM | POA: Diagnosis not present

## 2022-06-27 DIAGNOSIS — M1990 Unspecified osteoarthritis, unspecified site: Secondary | ICD-10-CM | POA: Diagnosis not present

## 2022-06-27 DIAGNOSIS — R000 Tachycardia, unspecified: Secondary | ICD-10-CM | POA: Diagnosis not present

## 2022-06-27 DIAGNOSIS — N179 Acute kidney failure, unspecified: Secondary | ICD-10-CM | POA: Diagnosis not present

## 2022-06-27 DIAGNOSIS — R197 Diarrhea, unspecified: Secondary | ICD-10-CM | POA: Diagnosis not present

## 2022-06-27 DIAGNOSIS — Z91138 Patient's unintentional underdosing of medication regimen for other reason: Secondary | ICD-10-CM | POA: Diagnosis not present

## 2022-06-27 DIAGNOSIS — G4733 Obstructive sleep apnea (adult) (pediatric): Secondary | ICD-10-CM | POA: Diagnosis not present

## 2022-06-27 DIAGNOSIS — K6389 Other specified diseases of intestine: Secondary | ICD-10-CM | POA: Diagnosis not present

## 2022-06-27 DIAGNOSIS — R627 Adult failure to thrive: Secondary | ICD-10-CM | POA: Diagnosis not present

## 2022-06-27 DIAGNOSIS — G20A1 Parkinson's disease without dyskinesia, without mention of fluctuations: Secondary | ICD-10-CM | POA: Diagnosis not present

## 2022-06-27 DIAGNOSIS — K219 Gastro-esophageal reflux disease without esophagitis: Secondary | ICD-10-CM | POA: Diagnosis not present

## 2022-06-27 DIAGNOSIS — R3911 Hesitancy of micturition: Secondary | ICD-10-CM | POA: Diagnosis not present

## 2022-06-27 DIAGNOSIS — N281 Cyst of kidney, acquired: Secondary | ICD-10-CM | POA: Diagnosis not present

## 2022-06-27 DIAGNOSIS — R531 Weakness: Secondary | ICD-10-CM | POA: Diagnosis present

## 2022-06-27 DIAGNOSIS — M25559 Pain in unspecified hip: Secondary | ICD-10-CM | POA: Diagnosis not present

## 2022-06-27 DIAGNOSIS — E222 Syndrome of inappropriate secretion of antidiuretic hormone: Secondary | ICD-10-CM | POA: Diagnosis not present

## 2022-06-27 DIAGNOSIS — I4510 Unspecified right bundle-branch block: Secondary | ICD-10-CM | POA: Diagnosis not present

## 2022-06-27 DIAGNOSIS — E871 Hypo-osmolality and hyponatremia: Secondary | ICD-10-CM | POA: Diagnosis not present

## 2022-06-27 DIAGNOSIS — M549 Dorsalgia, unspecified: Secondary | ICD-10-CM | POA: Diagnosis not present

## 2022-06-27 DIAGNOSIS — I10 Essential (primary) hypertension: Secondary | ICD-10-CM | POA: Diagnosis not present

## 2022-06-27 LAB — POCT URINALYSIS DIP (MANUAL ENTRY)
Bilirubin, UA: NEGATIVE
Glucose, UA: NEGATIVE mg/dL
Ketones, POC UA: NEGATIVE mg/dL
Nitrite, UA: NEGATIVE
Protein Ur, POC: 30 mg/dL — AB
Spec Grav, UA: 1.02 (ref 1.010–1.025)
Urobilinogen, UA: 0.2 E.U./dL
pH, UA: 6.5 (ref 5.0–8.0)

## 2022-06-27 MED ORDER — SULFAMETHOXAZOLE-TRIMETHOPRIM 800-160 MG PO TABS: 1.0000 | ORAL_TABLET | Freq: Two times a day (BID) | ORAL | 0 refills | Status: DC

## 2022-06-27 NOTE — ED Triage Notes (Signed)
Pt states he has been having burning when urinating, cloudy urine, increase frequency. That started 2 weeks ago. Took Azo and states it helped but burning came right back after the next day.

## 2022-06-27 NOTE — Progress Notes (Signed)
Patient no-showed today's appointment; appointment was for Video Urgent Care Visit.  

## 2022-06-27 NOTE — ED Provider Notes (Signed)
[name]    CSN: [id number] Arrival date & time: 06/27/22  1554      History   Chief Complaint No chief complaint on file.   HPI [name] is a 77 y.o. male.   HPI  Presents to urgent care with 2-week complaint of burning with urination, cloudy urine, increased frequency.  Past Medical History:  Diagnosis Date   A-fib ([place])    Arthritis    GERD (gastroesophageal reflux disease)    H/O urinary frequency    Heart murmur    as a child   History of bronchitis    HSV infection    oral   Hypertension    improved after back pain treated with surgery   Insomnia    Insomnia    Improved with Ambien, did not tolerate melatonin.   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   Paroxysmal atrial fibrillation ([place]) 07/15/2021   Irritant dermatitis 04/07/2021   Tinea pedis of both feet 04/07/2021   Peripheral edema 04/07/2021   Acute bronchitis 02/28/2021   Other fatigue 01/02/2021   Change in bowel habit 08/06/2020   Healthcare maintenance 03/31/2020   Hypothyroidism 03/31/2020   Dysfunction of eustachian tube 03/10/2020   Constipation 11/19/2019   Insomnia    Allergic rhinitis 07/03/2019   Hyponatremia 05/15/2019   Atrial fibrillation, chronic ([place]) 05/15/2019   Parkinson's disease 05/04/2019   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[place])    Atrial fibrillation with RVR ([place]) 04/24/2019   History of recent back surgery 04/24/2019   Body mass index (BMI) 25.0-25.9, adult 04/22/2019   Neural foraminal stenosis of lumbosacral spine 03/31/2019   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   Degenerative scoliosis in adult patient 03/20/2019   Other intervertebral disc  displacement, lumbosacral region 03/20/2019   Status post lumbar spinal fusion 02/20/2019   Neck pain 02/04/2019   Acute cough 04/24/2018   Skin lesion 10/31/2017   Lower urinary tract symptoms (LUTS) 04/17/2016   Oral lesion 02/16/2016   Medicare annual wellness visit, initial 12/01/2015   Advance care planning 12/01/2015   Lower back pain 12/01/2015   OSA on CPAP    HTN (hypertension) 04/23/2015   Spondylolisthesis of lumbar region 12/16/2014   GERD (gastroesophageal reflux disease) 10/11/2012   RBBB 06/27/2011    Past Surgical History:  Procedure Laterality Date   ACHILLES TENDON REPAIR Left    ALLOGRAFT APPLICATION Left [id number]   Procedure: APPLICATION OF  INTEGRA;  Surgeon: Irene Limbo, MD;  Location: [facility];  Service: Plastics;  Laterality: Left;   BACK SURGERY     COLONOSCOPY  02/2011   Negative,[facility]   DEBRIDEMENT AND CLOSURE WOUND Left 02/25/2018   Procedure: DEBRIDEMENT LEFT LEG APPLICATION INTEGRA;  Surgeon: Irene Limbo, MD;  Location: [facility];  Service: Plastics;  Laterality: Left;   EYE SURGERY Bilateral    cataracts   INCISION AND DRAINAGE OF WOUND Left 11/12/2020   Procedure: DEBRIDEMENT LLE ULCER;  Surgeon: Irene Limbo, MD;  Location: [facility];  Service: Plastics;  Laterality: Left;   KNEE ARTHROSCOPY     L   LUMBAR FUSION     2016  LUMBAR SPINE SURGERY  03/31/2019   exploration of lumbar fusion w/L5-S1 decompression redo L4-5 fusion   SPINE SURGERY  2003   cervical fusion   TONSILLECTOMY     varicoelectomy         Home Medications    Prior to Admission medications   Medication Sig Start Date End Date Taking? Authorizing Provider  acetaminophen (TYLENOL) 650 MG CR tablet Take 1,300 mg by mouth daily as needed for pain.   Yes [provider]  Carbidopa-Levodopa ER (RYTARY) 48.75-195 MG CPCR Take by mouth. Take one capsule three times a day 8am/1pm/6pm   Yes  [provider]  Carbidopa-Levodopa ER (RYTARY) 48.75-195 MG CPCR Samples of this drug were given to the patient, quantity 4 bottles, Lot Number [id number] exp: 10/24. Patient instructed on correct dosage and time to take medication. 02/28/22  Yes Tat, Eustace Quail, DO  diclofenac Sodium (VOLTAREN) 1 % GEL Apply 1 Application topically 4 (four) times daily as needed (pain.).   Yes [provider]  diltiazem (CARDIZEM CD) 180 MG 24 hr capsule Take 1 capsule (180 mg total) by mouth daily. 04/13/22  Yes Adrian Prows, MD  docusate sodium (COLACE) 100 MG capsule Take 1 capsule (100 mg total) by mouth 2 (two) times daily. Patient taking differently: Take 100 mg by mouth as needed. 10/21/21  Yes Newman Pies, MD  fluticasone [facility]) 50 MCG/ACT nasal spray Place 2 sprays into both nostrils daily. Patient taking differently: Place 2 sprays into both nostrils in the morning and at bedtime. 03/09/20  Yes Tonia Ghent, MD  omeprazole (PRILOSEC) 20 MG capsule Take 20 mg by mouth daily before breakfast.   Yes [provider]  polyethylene glycol powder (GLYCOLAX/MIRALAX) 17 GM/SCOOP powder Take 17 g by mouth 2 (two) times daily as needed for moderate constipation. 10/25/21  Yes Tonia Ghent, MD  pramipexole (MIRAPEX) 0.5 MG tablet TAKE 1 TABLET(0.5 MG) BY MOUTH THREE TIMES DAILY 05/19/22  Yes Tat, Eustace Quail, DO  propranolol (INDERAL) 20 MG tablet Take 1 tablet (20 mg total) by mouth 2 (two) times daily. 04/13/22  Yes Adrian Prows, MD  potassium chloride (KLOR-CON) 10 MEQ tablet TAKE 1 TABLET BY MOUTH TWICE A DAY Patient not taking: Reported on 01/20/2022 11/18/21   Adrian Prows, MD  tiZANidine (ZANAFLEX) 4 MG tablet Take 1 tablet (4 mg total) by mouth every 8 (eight) hours as needed for muscle spasms. 10/21/21   Newman Pies, MD  traMADol (ULTRAM) 50 MG tablet TAKE 1 TABLET (50 MG TOTAL) BY MOUTH EVERY 12 (TWELVE) HOURS AS NEEDED. 03/24/20   Tonia Ghent, MD    Family History Family  History  Problem Relation Age of Onset   Hypertension Mother    Asthma Mother    Diabetes Father    Stroke Father    Parkinson's disease Father    Cancer Sister    Heart attack Paternal Grandfather        >55   Occular Albinism Son    Hypertension Son    Agricultural engineer Albinism Daughter    Colon cancer Neg Hx    Prostate cancer Neg Hx     Social History Social History   Tobacco Use   Smoking status: Never    Passive exposure: Never   Smokeless tobacco: Never  Vaping Use   Vaping Use: Never used  Substance Use Topics   Alcohol use: No   Drug use: No     Allergies   Flexeril [cyclobenzaprine], Hctz [hydrochlorothiazide], Morphine  and related, Phenergan [promethazine], Sinemet [carbidopa-levodopa], and Tambocor [flecainide]   Review of Systems Review of Systems   Physical Exam Triage Vital Signs ED Triage Vitals [06/27/22 1650]  Enc Vitals Group     BP 131/82     Pulse Rate 67     Resp 16     Temp 97.9 F (36.6 C)     Temp Source Tympanic     SpO2 96 %     Weight      Height      Head Circumference      Peak Flow      Pain Score 0     Pain Loc      Pain Edu?      Excl. in [place]?    No data found.  Updated Vital Signs BP 131/82 (BP Location: Right Arm)   Pulse 67   Temp 97.9 F (36.6 C) (Tympanic)   Resp 16   SpO2 96%   Visual Acuity Right Eye Distance:   Left Eye Distance:   Bilateral Distance:    Right Eye Near:   Left Eye Near:    Bilateral Near:     Physical Exam Vitals reviewed.  Constitutional:      Appearance: Normal appearance.  Skin:    General: Skin is warm and dry.  Neurological:     General: No focal deficit present.     Mental Status: He is alert and oriented to person, place, and time.  Psychiatric:        Mood and Affect: Mood normal.        Behavior: Behavior normal.      UC Treatments / Results  Labs (all labs ordered are listed, but only abnormal results are displayed) Labs Reviewed  POCT URINALYSIS DIP (MANUAL  ENTRY)    EKG   Radiology No results found.  Procedures Procedures (including critical care time)  Medications Ordered in UC Medications - No data to display  Initial Impression / Assessment and Plan / UC Course  I have reviewed the triage vital signs and the nursing notes.  Pertinent labs & imaging results that were available during my care of the patient were reviewed by me and considered in my medical decision making (see chart for details).   UA is strongly indicative of urinary tract infection with large 3+ leukocytes and trace blood.  Will prescribe antibiotic therapy.  GFR greater than 60.  Prescribing Bactrim.  Sending culture for susceptibility.   Final Clinical Impressions(s) / UC Diagnoses   Final diagnoses:  None   Discharge Instructions   None    ED Prescriptions   None    PDMP not reviewed this encounter.   Rose Phi, [place] 06/27/22 1715

## 2022-06-27 NOTE — Discharge Instructions (Signed)
Follow up here or with your primary care provider if your symptoms are worsening or not improving with treatment.     

## 2022-06-28 LAB — URINE CULTURE: Culture: NO GROWTH

## 2022-06-29 ENCOUNTER — Telehealth (INDEPENDENT_AMBULATORY_CARE_PROVIDER_SITE_OTHER): Payer: PPO | Admitting: Family Medicine

## 2022-06-29 ENCOUNTER — Inpatient Hospital Stay (HOSPITAL_COMMUNITY)
Admission: EM | Admit: 2022-06-29 | Discharge: 2022-07-02 | DRG: 644 | Disposition: A | Discharge status: Home / Self Care | Payer: PPO | Attending: Internal Medicine | Admitting: Internal Medicine

## 2022-06-29 ENCOUNTER — Ambulatory Visit: Payer: PPO | Admitting: Family Medicine

## 2022-06-29 ENCOUNTER — Encounter: Payer: Self-pay | Admitting: Family Medicine

## 2022-06-29 ENCOUNTER — Encounter (HOSPITAL_COMMUNITY): Payer: Self-pay

## 2022-06-29 ENCOUNTER — Other Ambulatory Visit: Payer: Self-pay

## 2022-06-29 ENCOUNTER — Emergency Department (HOSPITAL_COMMUNITY): Payer: PPO

## 2022-06-29 VITALS — Temp 99.2°F | Ht 75.0 in | Wt 210.0 lb

## 2022-06-29 DIAGNOSIS — R197 Diarrhea, unspecified: Secondary | ICD-10-CM | POA: Diagnosis present

## 2022-06-29 DIAGNOSIS — E860 Dehydration: Secondary | ICD-10-CM | POA: Diagnosis present

## 2022-06-29 DIAGNOSIS — T428X6A Underdosing of antiparkinsonism drugs and other central muscle-tone depressants, initial encounter: Secondary | ICD-10-CM | POA: Diagnosis present

## 2022-06-29 DIAGNOSIS — M25559 Pain in unspecified hip: Secondary | ICD-10-CM | POA: Diagnosis present

## 2022-06-29 DIAGNOSIS — Z820 Family history of epilepsy and other diseases of the nervous system: Secondary | ICD-10-CM

## 2022-06-29 DIAGNOSIS — R627 Adult failure to thrive: Secondary | ICD-10-CM | POA: Diagnosis present

## 2022-06-29 DIAGNOSIS — I4510 Unspecified right bundle-branch block: Secondary | ICD-10-CM | POA: Diagnosis present

## 2022-06-29 DIAGNOSIS — G20A1 Parkinson's disease without dyskinesia, without mention of fluctuations: Secondary | ICD-10-CM | POA: Diagnosis present

## 2022-06-29 DIAGNOSIS — Z825 Family history of asthma and other chronic lower respiratory diseases: Secondary | ICD-10-CM

## 2022-06-29 DIAGNOSIS — K219 Gastro-esophageal reflux disease without esophagitis: Secondary | ICD-10-CM | POA: Diagnosis present

## 2022-06-29 DIAGNOSIS — K529 Noninfective gastroenteritis and colitis, unspecified: Secondary | ICD-10-CM | POA: Insufficient documentation

## 2022-06-29 DIAGNOSIS — Z91138 Patient's unintentional underdosing of medication regimen for other reason: Secondary | ICD-10-CM

## 2022-06-29 DIAGNOSIS — Z79899 Other long term (current) drug therapy: Secondary | ICD-10-CM

## 2022-06-29 DIAGNOSIS — G8929 Other chronic pain: Secondary | ICD-10-CM | POA: Diagnosis present

## 2022-06-29 DIAGNOSIS — N179 Acute kidney failure, unspecified: Secondary | ICD-10-CM | POA: Diagnosis present

## 2022-06-29 DIAGNOSIS — E871 Hypo-osmolality and hyponatremia: Secondary | ICD-10-CM | POA: Diagnosis present

## 2022-06-29 DIAGNOSIS — G4733 Obstructive sleep apnea (adult) (pediatric): Secondary | ICD-10-CM | POA: Diagnosis present

## 2022-06-29 DIAGNOSIS — E222 Syndrome of inappropriate secretion of antidiuretic hormone: Secondary | ICD-10-CM | POA: Diagnosis present

## 2022-06-29 DIAGNOSIS — R531 Weakness: Secondary | ICD-10-CM | POA: Diagnosis present

## 2022-06-29 DIAGNOSIS — Z823 Family history of stroke: Secondary | ICD-10-CM

## 2022-06-29 DIAGNOSIS — N401 Enlarged prostate with lower urinary tract symptoms: Secondary | ICD-10-CM | POA: Diagnosis present

## 2022-06-29 DIAGNOSIS — K6389 Other specified diseases of intestine: Secondary | ICD-10-CM | POA: Diagnosis present

## 2022-06-29 DIAGNOSIS — R0602 Shortness of breath: Secondary | ICD-10-CM | POA: Diagnosis present

## 2022-06-29 DIAGNOSIS — R3911 Hesitancy of micturition: Secondary | ICD-10-CM | POA: Diagnosis present

## 2022-06-29 DIAGNOSIS — R300 Dysuria: Secondary | ICD-10-CM

## 2022-06-29 DIAGNOSIS — I10 Essential (primary) hypertension: Secondary | ICD-10-CM | POA: Diagnosis present

## 2022-06-29 DIAGNOSIS — Z833 Family history of diabetes mellitus: Secondary | ICD-10-CM

## 2022-06-29 DIAGNOSIS — M549 Dorsalgia, unspecified: Secondary | ICD-10-CM | POA: Diagnosis present

## 2022-06-29 DIAGNOSIS — R110 Nausea: Secondary | ICD-10-CM | POA: Diagnosis present

## 2022-06-29 DIAGNOSIS — Z1152 Encounter for screening for COVID-19: Secondary | ICD-10-CM | POA: Diagnosis not present

## 2022-06-29 DIAGNOSIS — G4700 Insomnia, unspecified: Secondary | ICD-10-CM | POA: Diagnosis present

## 2022-06-29 DIAGNOSIS — R079 Chest pain, unspecified: Secondary | ICD-10-CM

## 2022-06-29 DIAGNOSIS — M1990 Unspecified osteoarthritis, unspecified site: Secondary | ICD-10-CM | POA: Diagnosis present

## 2022-06-29 DIAGNOSIS — Z8249 Family history of ischemic heart disease and other diseases of the circulatory system: Secondary | ICD-10-CM

## 2022-06-29 DIAGNOSIS — Z888 Allergy status to other drugs, medicaments and biological substances status: Secondary | ICD-10-CM

## 2022-06-29 DIAGNOSIS — Z981 Arthrodesis status: Secondary | ICD-10-CM

## 2022-06-29 DIAGNOSIS — Z6826 Body mass index (BMI) 26.0-26.9, adult: Secondary | ICD-10-CM

## 2022-06-29 LAB — CBC
HCT: 38.1 % — ABNORMAL LOW (ref 39.0–52.0)
Hemoglobin: 14 g/dL (ref 13.0–17.0)
MCH: 31.7 pg (ref 26.0–34.0)
MCHC: 36.7 g/dL — ABNORMAL HIGH (ref 30.0–36.0)
MCV: 86.2 fL (ref 80.0–100.0)
Platelets: 217 10*3/uL (ref 150–400)
RBC: 4.42 MIL/uL (ref 4.22–5.81)
RDW: 11.5 % (ref 11.5–15.5)
WBC: 8.3 10*3/uL (ref 4.0–10.5)
nRBC: 0 % (ref 0.0–0.2)

## 2022-06-29 LAB — RESP PANEL BY RT-PCR (RSV, FLU A&B, COVID)  RVPGX2
Influenza A by PCR: NEGATIVE
Influenza B by PCR: NEGATIVE
Resp Syncytial Virus by PCR: NEGATIVE
SARS Coronavirus 2 by RT PCR: NEGATIVE

## 2022-06-29 LAB — URINALYSIS, ROUTINE W REFLEX MICROSCOPIC
Bilirubin Urine: NEGATIVE
Glucose, UA: NEGATIVE mg/dL
Ketones, ur: NEGATIVE mg/dL
Nitrite: NEGATIVE
Protein, ur: NEGATIVE mg/dL
Specific Gravity, Urine: 1.021 (ref 1.005–1.030)
WBC, UA: >50 WBC/hpf (ref 0–5)
pH: 6 (ref 5.0–8.0)

## 2022-06-29 LAB — COMPREHENSIVE METABOLIC PANEL
ALT: 15 U/L (ref 0–44)
AST: 20 U/L (ref 15–41)
Albumin: 3.6 g/dL (ref 3.5–5.0)
Alkaline Phosphatase: 64 U/L (ref 38–126)
Anion gap: 10 (ref 5–15)
BUN: 11 mg/dL (ref 8–23)
CO2: 20 mmol/L — ABNORMAL LOW (ref 22–32)
Calcium: 8.5 mg/dL — ABNORMAL LOW (ref 8.9–10.3)
Chloride: 91 mmol/L — ABNORMAL LOW (ref 98–111)
Creatinine, Ser: 1.38 mg/dL — ABNORMAL HIGH (ref 0.61–1.24)
GFR, Estimated: 53 mL/min — ABNORMAL LOW (ref >60)
Glucose, Bld: 112 mg/dL — ABNORMAL HIGH (ref 70–99)
Potassium: 3.9 mmol/L (ref 3.5–5.1)
Sodium: 121 mmol/L — ABNORMAL LOW (ref 135–145)
Total Bilirubin: 1 mg/dL (ref 0.3–1.2)
Total Protein: 6.7 g/dL (ref 6.5–8.1)

## 2022-06-29 LAB — SODIUM
Sodium: 121 mmol/L — ABNORMAL LOW (ref 135–145)
Sodium: 121 mmol/L — ABNORMAL LOW (ref 135–145)

## 2022-06-29 LAB — OSMOLALITY: Osmolality: 249 mOsm/kg — CL (ref 275–295)

## 2022-06-29 LAB — OSMOLALITY, URINE: Osmolality, Ur: 268 mOsm/kg — ABNORMAL LOW (ref 300–900)

## 2022-06-29 LAB — LACTIC ACID, PLASMA: Lactic Acid, Venous: 1 mmol/L (ref 0.5–1.9)

## 2022-06-29 LAB — SODIUM, URINE, RANDOM: Sodium, Ur: 32 mmol/L

## 2022-06-29 LAB — LIPASE, BLOOD: Lipase: 32 U/L (ref 11–51)

## 2022-06-29 LAB — URIC ACID: Uric Acid, Serum: 4.7 mg/dL (ref 3.7–8.6)

## 2022-06-29 MED ORDER — ACETAMINOPHEN 500 MG PO TABS: 1000.0000 mg | ORAL_TABLET | Freq: Every day | ORAL | Status: DC | PRN

## 2022-06-29 MED ORDER — TIZANIDINE HCL 4 MG PO TABS
4.0000 mg | ORAL_TABLET | Freq: Three times a day (TID) | ORAL | Status: DC | PRN
  Administered 2022-06-29: 4 mg via ORAL
  Filled 2022-06-29: qty 1

## 2022-06-29 MED ORDER — ONDANSETRON HCL 4 MG/2ML IJ SOLN
4.0000 mg | Freq: Once | INTRAMUSCULAR | Status: AC
  Administered 2022-06-29: 4 mg via INTRAVENOUS
  Filled 2022-06-29: qty 2

## 2022-06-29 MED ORDER — HYDROMORPHONE HCL 1 MG/ML IJ SOLN
0.5000 mg | INTRAMUSCULAR | Status: AC | PRN
  Administered 2022-06-30 – 2022-07-01 (×3): 0.5 mg via INTRAVENOUS
  Filled 2022-06-29 (×3): qty 0.5

## 2022-06-29 MED ORDER — BISMUTH SUBSALICYLATE 262 MG PO CHEW
524.0000 mg | CHEWABLE_TABLET | ORAL | Status: DC | PRN
  Administered 2022-07-01: 524 mg via ORAL
  Filled 2022-06-29 (×2): qty 2

## 2022-06-29 MED ORDER — OXYCODONE HCL 5 MG PO TABS
5.0000 mg | ORAL_TABLET | Freq: Four times a day (QID) | ORAL | Status: AC | PRN
  Administered 2022-06-29 – 2022-06-30 (×3): 5 mg via ORAL
  Filled 2022-06-29 (×3): qty 1

## 2022-06-29 MED ORDER — POLYETHYLENE GLYCOL 3350 17 G PO PACK: 17.0000 g | PACK | Freq: Every day | ORAL | Status: DC | PRN

## 2022-06-29 MED ORDER — SODIUM CHLORIDE 0.9 % IV SOLN: INTRAVENOUS | Status: DC

## 2022-06-29 MED ORDER — SODIUM CHLORIDE 0.9 % IV BOLUS
1000.0000 mL | Freq: Once | INTRAVENOUS | Status: AC
  Administered 2022-06-29: 1000 mL via INTRAVENOUS

## 2022-06-29 MED ORDER — METRONIDAZOLE 500 MG/100ML IV SOLN
500.0000 mg | Freq: Two times a day (BID) | INTRAVENOUS | Status: DC
  Administered 2022-06-30 – 2022-07-02 (×5): 500 mg via INTRAVENOUS
  Filled 2022-06-29 (×5): qty 100

## 2022-06-29 MED ORDER — SODIUM CHLORIDE 0.9 % IV SOLN: 6.2500 mg | Freq: Four times a day (QID) | INTRAVENOUS | Status: DC | PRN

## 2022-06-29 MED ORDER — SODIUM CHLORIDE 0.9 % IV SOLN
2.0000 g | INTRAVENOUS | Status: DC
  Administered 2022-06-29 – 2022-07-01 (×3): 2 g via INTRAVENOUS
  Filled 2022-06-29 (×3): qty 20

## 2022-06-29 MED ORDER — PROPRANOLOL HCL 10 MG PO TABS
10.0000 mg | ORAL_TABLET | Freq: Two times a day (BID) | ORAL | Status: DC
  Administered 2022-06-29 – 2022-07-02 (×5): 10 mg via ORAL
  Filled 2022-06-29 (×5): qty 1

## 2022-06-29 MED ORDER — PRAMIPEXOLE DIHYDROCHLORIDE 0.25 MG PO TABS
0.5000 mg | ORAL_TABLET | Freq: Three times a day (TID) | ORAL | Status: DC
  Administered 2022-06-29 – 2022-07-02 (×8): 0.5 mg via ORAL
  Filled 2022-06-29 (×9): qty 2

## 2022-06-29 MED ORDER — IOHEXOL 350 MG/ML SOLN
75.0000 mL | Freq: Once | INTRAVENOUS | Status: AC | PRN
  Administered 2022-06-29: 75 mL via INTRAVENOUS

## 2022-06-29 MED ORDER — CARBIDOPA-LEVODOPA ER 50-200 MG PO TBCR
1.0000 | EXTENDED_RELEASE_TABLET | Freq: Three times a day (TID) | ORAL | Status: DC
  Filled 2022-06-29 (×3): qty 1

## 2022-06-29 MED ORDER — CIPROFLOXACIN IN D5W 400 MG/200ML IV SOLN
400.0000 mg | Freq: Once | INTRAVENOUS | Status: AC
  Administered 2022-06-29: 400 mg via INTRAVENOUS
  Filled 2022-06-29: qty 200

## 2022-06-29 MED ORDER — HYDROMORPHONE HCL 1 MG/ML IJ SOLN
0.5000 mg | Freq: Once | INTRAMUSCULAR | Status: AC
  Administered 2022-06-29: 0.5 mg via INTRAVENOUS
  Filled 2022-06-29: qty 0.5

## 2022-06-29 MED ORDER — CALCIUM CARBONATE ANTACID 500 MG PO CHEW
1.0000 | CHEWABLE_TABLET | Freq: Two times a day (BID) | ORAL | Status: AC
  Administered 2022-06-29 – 2022-07-01 (×4): 200 mg via ORAL
  Filled 2022-06-29 (×4): qty 1

## 2022-06-29 MED ORDER — ENOXAPARIN SODIUM 30 MG/0.3ML IJ SOSY
30.0000 mg | PREFILLED_SYRINGE | INTRAMUSCULAR | Status: DC
  Administered 2022-06-29: 30 mg via SUBCUTANEOUS
  Filled 2022-06-29: qty 0.3

## 2022-06-29 MED ORDER — FAMOTIDINE IN NACL 20-0.9 MG/50ML-% IV SOLN
20.0000 mg | INTRAVENOUS | Status: DC
  Administered 2022-06-29 – 2022-07-01 (×3): 20 mg via INTRAVENOUS
  Filled 2022-06-29 (×3): qty 50

## 2022-06-29 MED ORDER — CARBIDOPA-LEVODOPA ER 48.75-195 MG PO CPCR
1.0000 | ORAL_CAPSULE | Freq: Three times a day (TID) | ORAL | Status: DC
  Administered 2022-06-30 – 2022-07-02 (×5): 1 via ORAL
  Filled 2022-06-29 (×6): qty 1

## 2022-06-29 MED ORDER — METRONIDAZOLE 500 MG/100ML IV SOLN
500.0000 mg | Freq: Three times a day (TID) | INTRAVENOUS | Status: DC
  Administered 2022-06-29: 500 mg via INTRAVENOUS
  Filled 2022-06-29: qty 100

## 2022-06-29 NOTE — Consult Note (Signed)
[name] Admit Date: 06/29/2022 06/29/2022 [name] Requesting Physician:  Erlinda Hong MD  Reason for Consult:  Hyponatremia HPI:  25M seen for an evaluation of hyponatremia.  Patient with PMH including Parkinson's disease on carbidopa/levo, history of A-fib on diltiazem and propranolol, OSA on CPAP who presented to the ED earlier today because of progressive fevers, abdominal pain, nausea/vomiting, dysuria, anorexia, and inability to take his medications for the past 2 days.  He returned from a trip to [place] a few days ago.  While in [place] he had some dysuria that was self-limiting.  Yesterday morning he had a normal breakfast but since that time has had progressive anorexia, only tolerating water and ginger ale, inability to take his medications.  He was seen in urgent care on 3/12 with dysuria, cloudy urine, and a UA suggestive of UTI and started on Bactrim.  Culture has subsequently resulted as no growth final.  His symptoms have not improved.  He presented to the ED.  Here imaging with CT of the abdomen and pelvis shows no acute findings, no clear evidence of colitis.  Started on ciprofloxacin and Flagyl for question colitis.  Received 1 L normal saline.  Presenting serum sodium of 121.  Serum sodium was 130 on 01/20/2022, prior to that has been normal.  He has normal GFR.  Serum osmolality is low at 249.  Urine osmolality pending but specific gravity is 1.021; urine sodium is 32.  His resting tremor is markedly worse than usual partially because he has not received his medications.  He is not confused, he is participatory in conversation.   Creat (mg/dL)  Date Value  01/20/2022 1.06  01/14/2019 1.32 (H)   Creatinine, Ser (mg/dL)  Date Value  06/29/2022 1.38 (H)  10/13/2021 1.15  07/15/2021 1.43  04/07/2021 1.25  12/30/2020 1.28  07/01/2020 1.20  03/23/2020 1.29  11/19/2019 1.26  05/26/2019 1.37  05/17/2019 1.03  ] I/Os: 1L NS in ED  ROS NSAIDS: No use IV Contrast  CT with contrast in ED TMP/SMX recent exposure for UTI Hypotension not present Balance of 12 systems is negative w/ exceptions as above  PMH  Past Medical History:  Diagnosis Date   A-fib ([place])    Arthritis    GERD (gastroesophageal reflux disease)    H/O urinary frequency    Heart murmur    as a child   History of bronchitis    HSV infection    oral   Hypertension    improved after back pain treated with surgery   Insomnia    Insomnia    Improved with Ambien, did not tolerate melatonin.   Joint pain    Parkinson disease    Sleep apnea    no CPAP   Vertigo 10/13/2014   recurrent; trigger is cool air on left ear, improved with daily antihistamine   PSH  Past Surgical History:  Procedure Laterality Date   ACHILLES TENDON REPAIR Left    ALLOGRAFT APPLICATION Left [id number]   Procedure: APPLICATION OF  INTEGRA;  Surgeon: Irene Limbo, MD;  Location: [facility];  Service: Plastics;  Laterality: Left;   BACK SURGERY     COLONOSCOPY  02/2011   Negative,[facility]   DEBRIDEMENT AND CLOSURE WOUND Left 02/25/2018   Procedure: DEBRIDEMENT LEFT LEG APPLICATION INTEGRA;  Surgeon: Irene Limbo, MD;  Location: [facility];  Service: Plastics;  Laterality: Left;   EYE SURGERY Bilateral    cataracts   INCISION AND DRAINAGE OF WOUND  Left 11/12/2020   Procedure: DEBRIDEMENT LLE ULCER;  Surgeon: Irene Limbo, MD;  Location: [facility];  Service: Plastics;  Laterality: Left;   KNEE ARTHROSCOPY     L   LUMBAR FUSION     2016   LUMBAR SPINE SURGERY  03/31/2019   exploration of lumbar fusion w/L5-S1 decompression redo L4-5 fusion   SPINE SURGERY  2003   cervical fusion   TONSILLECTOMY     varicoelectomy     FH  Family History  Problem Relation Age of Onset   Hypertension Mother    Asthma Mother    Diabetes Father    Stroke Father    Parkinson's disease Father    Cancer Sister    Heart attack Paternal Grandfather         >55   Occular Albinism Son    Hypertension Son    Agricultural engineer Albinism Daughter    Colon cancer Neg Hx    Prostate cancer Neg Hx    SH  reports that he has never smoked. He has never been exposed to tobacco smoke. He has never used smokeless tobacco. He reports that he does not drink alcohol and does not use drugs. Allergies  Allergies  Allergen Reactions   Flexeril [Cyclobenzaprine] Other (See Comments)    Constipation   Hctz [Hydrochlorothiazide] Other (See Comments)    Hyponatremia    Morphine And Related Hives and Other (See Comments)    Hallucinations Can tolerate oxycodone   Phenergan [Promethazine] Other (See Comments)    Told to avoid due to Parkinson's   Sinemet [Carbidopa-Levodopa] Other (See Comments)    Unknown reaction   Tambocor [Flecainide] Nausea And Vomiting   Home medications Prior to Admission medications   Medication Sig Start Date End Date Taking? Authorizing Provider  acetaminophen (TYLENOL) 500 MG tablet Take 1,000 mg by mouth daily as needed for moderate pain.   Yes [provider]  Carbidopa-Levodopa ER (RYTARY) 48.75-195 MG CPCR Take 1 capsule by mouth See admin instructions. 1 capsule 3 times daily at 0800, 1300, 1800   Yes [provider]  diclofenac Sodium (VOLTAREN) 1 % GEL Apply 1 Application topically 4 (four) times daily as needed (pain.).   Yes [provider]  diltiazem (CARDIZEM CD) 180 MG 24 hr capsule Take 1 capsule (180 mg total) by mouth daily. Patient taking differently: Take 180 mg by mouth at bedtime. 04/13/22  Yes Adrian Prows, MD  docusate sodium (COLACE) 100 MG capsule Take 1 capsule (100 mg total) by mouth 2 (two) times daily. 10/21/21  Yes Newman Pies, MD  fluticasone [facility]) 50 MCG/ACT nasal spray Place 2 sprays into both nostrils daily. Patient taking differently: Place 2 sprays into both nostrils at bedtime. 03/09/20  Yes Tonia Ghent, MD  Multiple Vitamins-Minerals (MULTIVITAMIN MEN 50+) TABS  Take 1 tablet by mouth daily.   Yes [provider]  omeprazole (PRILOSEC OTC) 20 MG tablet Take 20 mg by mouth daily as needed (heartburn).   Yes [provider]  potassium chloride (KLOR-CON) 10 MEQ tablet TAKE 1 TABLET BY MOUTH TWICE A DAY Patient taking differently: Take 10 mEq by mouth 2 (two) times daily. 11/18/21  Yes Adrian Prows, MD  pramipexole (MIRAPEX) 0.5 MG tablet TAKE 1 TABLET(0.5 MG) BY MOUTH THREE TIMES DAILY Patient taking differently: Take 0.5 mg by mouth 3 (three) times daily. 05/19/22  Yes Tat, Eustace Quail, DO  propranolol (INDERAL) 20 MG tablet Take 1 tablet (20 mg total) by mouth 2 (two)  times daily. 04/13/22  Yes Adrian Prows, MD  sulfamethoxazole-trimethoprim (BACTRIM DS) 800-160 MG tablet Take 1 tablet by mouth 2 (two) times daily. 5 day course. Patient not taking: Reported on 06/29/2022    [provider]  tiZANidine (ZANAFLEX) 4 MG tablet Take 1 tablet (4 mg total) by mouth every 8 (eight) hours as needed for muscle spasms. Patient not taking: Reported on 06/29/2022 10/21/21   Newman Pies, MD    Current Medications Scheduled Meds:  carbidopa-levodopa  1 tablet Oral TID   Continuous Infusions:  sodium chloride     metronidazole Stopped (06/29/22 1830)   PRN Meds:.  CBC Recent Labs  Lab 06/29/22 1336  WBC 8.3  HGB 14.0  HCT 38.1*  MCV 86.2  PLT [id number]   Basic Metabolic Panel Recent Labs  Lab 06/29/22 1336  NA 121*  K 3.9  CL 91*  CO2 20*  GLUCOSE 112*  BUN 11  CREATININE 1.38*  CALCIUM 8.5*    Physical Exam  Blood pressure (!) 147/94, pulse (!) 105, temperature 99.9 F (37.7 C), temperature source Oral, resp. rate (!) 23, height '6\' 3"'$  (1.905 m), weight 95.3 kg, SpO2 98 %. GEN: Appears uncomfortable, coherent and participatory in history and physical ENT: NCAT EYES: EOMI CV: Regular, mild tachycardia, no rapid PULM: Clear bilaterally ABD: Mild distention, mild tenderness in the lower abdomen, bowel sounds present, no  rebound or guarding SKIN: No rashes or lesions EXT: No lower extremity edema NEUROLOGICAL: Resting tremor especially of the right upper extremity  Assessment 6M with acute hypotonic hyponatremia in the setting of progressive fever/abdominal pain/dysuria/nausea/vomiting.  Hypotonic hyponatremia, most consistent with euvolemia; differential is low solute intake but he had fairly normal intake up until yesterday and I think that would be quick for the development of low solute hyponatremia; also possible SIADH driven by pain, nausea/vomiting.  Urine sodium is higher than would be expected if this is a hypovolemic low solute state.  Urine osmolality pending which will be helpful.  Serum sodium also ordered and pending.  Would not give any further IV fluids, he has a received a 1 L challenge and will see which way he went without.  If his sodium has fallen further he needs fluid restriction as the primary treatment for likely SIADH. Fevers, nausea/vomiting, dysuria, abdominal pain: Urine culture 3/12 was negative.  Currently receiving ciprofloxacin and Flagyl per TRH.  CT imaging without conclusive evidence of colitis. Parkinson disease: Tremor worsened currently due to acute illness and missing medications, meds resumed.  Per TR[facility].  Plan As above, await subsequent serum sodiums.  Await urine osmolality. No further IV fluids at this time. No indication for hypertonic saline.  No indication for use of vaptan If serum sodium is lower, stop all IV fluids and placed on 1 L fluid restriction Goal is a serum sodium of 126 over the next 24 hours Will follow closely.   [name]  06/29/2022, 6:51 PM

## 2022-06-29 NOTE — H&P (Addendum)
History and Physical    [name] [id number] DOB: [birth year] DOA: 06/29/2022  PCP: Tonia Ghent, MD Patient coming from: home  I have personally briefly reviewed patient's old medical records in [place]  Chief Complaint: ab pain, nausea   HPI: [name] is a 77 y.o. male with medical history significant of Parkinson's, chronic RBBB, post op AF in Dec 2020, was taken off  Xarelto due to no recurrence , OSA on CPAP, presented to ED due to not feeling well for a week, had abdominal pain and nausea not able to take Parkinson's medication for the last 2 days, has worsening tremors, diffuse ab pain, has diarrheax1 at home, reports fever 102 at home, he was seen at urgent care on 3/12 was diagnosed with UTI was given Bactrim, however he did not improve,he presents to the ED  ED Course:   Data Independently reviewed: Blood pressure (!) 147/94, pulse (!) 105, temperature 99.9 F (37.7 C), temperature source Oral, resp. rate (!) 23, height '6\' 3"'$  (1.905 m), weight 95.3 kg, SpO2 98 %.  -Respiratory viral panel negative for RSV/flu/COVID-19 -CT ab/pel with contrast: Colon is diffusely distended with fluid and stool, nonspecific. No associated colonic wall thickening to suggest colitis. -EKG: .  Sinus rhythm ,chronic RBBB, no acute ST-T changes, Qtc 449 -Blood culture in process -UA pending in the ED if large leukocyte, fewer bacteria, negative nitrite -Lactic acid  1 -WBC 8.3 -cr 1.38 (baseline 1.41-monthago) -Na 121  Given cipro/flagyl, ns 1liter, hospitalist call to admit  Review of Systems: As per HPI otherwise all other systems reviewed and are negative.   Past Medical History:  Diagnosis Date   [place]-fib [place]    Arthritis    GERD (gastroesophageal reflux disease)    H/O urinary frequency    Heart murmur    as a child   History of bronchitis    HSV infection    oral   Hypertension    improved after back pain treated with surgery   Insomnia     Insomnia    Improved with Ambien, did not tolerate melatonin.   Joint pain    Parkinson disease    Sleep apnea    no CPAP   Vertigo 10/13/2014   recurrent; trigger is cool air on left ear, improved with daily antihistamine    Past Surgical History:  Procedure Laterality Date   ACHILLES TENDON REPAIR Left    ALLOGRAFT APPLICATION Left [id number]  Procedure: APPLICATION OF  INTEGRA;  Surgeon: TIrene Limbo MD;  Location[facility]  Service: Plastics;  Laterality: Left;   BACK SURGERY     COLONOSCOPY  02/2011   Negative,[facility]   DEBRIDEMENT AND CLOSURE WOUND Left 02/25/2018   Procedure: DEBRIDEMENT LEFT LEG APPLICATION INTEGRA;  Surgeon: TIrene Limbo MD;  [place] [facility]  Service: Plastics;  Laterality: Left;   EYE SURGERY Bilateral    cataracts   INCISION AND DRAINAGE OF WOUND Left 11/12/2020   Procedure: DEBRIDEMENT LLE ULCER;  Surgeon: TIrene Limbo MD;  [facility]  Service: Plastics;  Laterality: Left;   KNEE ARTHROSCOPY     L   LUMBAR FUSION     2016   LUMBAR SPINE SURGERY  03/31/2019   exploration of lumbar fusion w/L5-S1 decompression redo L4-5 fusion   SPINE SURGERY  2003   cervical fusion   TONSILLECTOMY     varicoelectomy      Social History  reports that he has never smoked. He has never been exposed to tobacco smoke. He has never used smokeless tobacco. He reports that he does not drink alcohol and does not use drugs.  Allergies  Allergen Reactions   Flexeril [Cyclobenzaprine] Other (See Comments)    Constipation   Hctz [Hydrochlorothiazide] Other (See Comments)    Hyponatremia    Morphine And Related Hives and Other (See Comments)    Hallucinations Can tolerate oxycodone   Phenergan [Promethazine] Other (See Comments)    Told to avoid due to Parkinson's   Sinemet [Carbidopa-Levodopa] Other (See Comments)    Unknown reaction   Tambocor [Flecainide] Nausea And Vomiting     Family History  Problem Relation Age of Onset   Hypertension Mother    Asthma Mother    Diabetes Father    Stroke Father    Parkinson's disease Father    Cancer Sister    Heart attack Paternal Grandfather        >55   Occular Albinism Son    Hypertension Son    Agricultural engineer Albinism Daughter    Colon cancer Neg Hx    Prostate cancer Neg Hx     Prior to Admission medications   Medication Sig Start Date End Date Taking? Authorizing Provider  acetaminophen (TYLENOL) 650 MG CR tablet Take 1,300 mg by mouth daily as needed for pain.    [provider]  Carbidopa-Levodopa ER (RYTARY) 48.75-195 MG CPCR Take by mouth. Take one capsule three times a day 8am/1pm/6pm    [provider]  diclofenac Sodium (VOLTAREN) 1 % GEL Apply 1 Application topically 4 (four) times daily as needed (pain.).    [provider]  diltiazem (CARDIZEM CD) 180 MG 24 hr capsule Take 1 capsule (180 mg total) by mouth daily. 04/13/22   Adrian Prows, MD  docusate sodium (COLACE) 100 MG capsule Take 1 capsule (100 mg total) by mouth 2 (two) times daily. 10/21/21   Newman Pies, MD  fluticasone [facility]) 50 MCG/ACT nasal spray Place 2 sprays into both nostrils daily. 03/09/20   Tonia Ghent, MD  omeprazole (PRILOSEC) 20 MG capsule Take 20 mg by mouth daily before breakfast.    [provider]  potassium chloride (KLOR-CON) 10 MEQ tablet TAKE 1 TABLET BY MOUTH TWICE A DAY 11/18/21   Adrian Prows, MD  pramipexole (MIRAPEX) 0.5 MG tablet TAKE 1 TABLET(0.5 MG) BY MOUTH THREE TIMES DAILY 05/19/22   Tat, Eustace Quail, DO  propranolol (INDERAL) 20 MG tablet Take 1 tablet (20 mg total) by mouth 2 (two) times daily. 04/13/22   Adrian Prows, MD  tiZANidine (ZANAFLEX) 4 MG tablet Take 1 tablet (4 mg total) by mouth every 8 (eight) hours as needed for muscle spasms. 10/21/21   Newman Pies, MD    Physical Exam: Vitals:   06/29/22 1235 06/29/22 1419 06/29/22 1615 06/29/22 2000  BP:  108/75 (!) 147/94  (!) 149/88  Pulse:  (!) 123 (!) 105 99  Resp:  18 (!) 23 18  Temp:   99.9 F (37.7 C) 98.1 F (36.7 C)  TempSrc:   Oral Oral  SpO2:  95% 98% 93%  Weight: 95.3 kg     Height: '6\' 3"'$  (1.905 m)       Constitutional: aaox3, + tremors Eyes: PERRL, lids and conjunctivae normal ENMT: Mucous membranes are moist.  Respiratory: clear to auscultation bilaterally, no wheezing, no crackles. Normal respiratory effort. No accessory muscle use.  Cardiovascular: Regular rate and rhythm,  No extremity  edema. 2+ pedal pulses. No carotid bruits.  Abdomen: diffuse tender, slightly distended, no rebound , no guarding + Bowel sounds positive.  Musculoskeletal: no clubbing / cyanosis. No joint deformity upper and lower extremities. Good ROM, no contractures. Normal muscle tone.  Skin: no rashes, lesions, ulcers. No induration Neurologic: CN 2-12 grossly intact. Sensation intact, Strength 5/5 in all 4.  Psychiatric: Normal judgment and insight. Alert and oriented x 3. Normal mood.    Labs on Admission: I have personally reviewed following labs and imaging studies  CBC: Recent Labs  Lab 06/29/22 1336  WBC 8.3  HGB 14.0  HCT 38.1*  MCV 86.2  PLT [id number]    Basic Metabolic Panel: Recent Labs  Lab 06/29/22 1336  NA 121*  K 3.9  CL 91*  CO2 20*  GLUCOSE 112*  BUN 11  CREATININE 1.38*  CALCIUM 8.5*    GFR: Estimated Creatinine Clearance: 54.4 mL/min (A) (by C-G formula based on SCr of 1.38 mg/dL (H)).  Liver Function Tests: Recent Labs  Lab 06/29/22 1336  AST 20  ALT 15  ALKPHOS 64  BILITOT 1.0  PROT 6.7  ALBUMIN 3.6    Urine analysis:    Component Value Date/Time   COLORURINE YELLOW 06/29/2022 1734   APPEARANCEUR CLOUDY (A) 06/29/2022 1734   LABSPEC 1.021 06/29/2022 1734   PHURINE 6.0 06/29/2022 1734   GLUCOSEU NEGATIVE 06/29/2022 1734   GLUCOSEU NEGATIVE 05/02/2019 1054   HGBUR SMALL (A) 06/29/2022 1734   BILIRUBINUR NEGATIVE 06/29/2022 1734   BILIRUBINUR negative  06/27/2022 1706   BILIRUBINUR positive-2+ 05/19/2019 0941   KETONESUR NEGATIVE 06/29/2022 1734   PROTEINUR NEGATIVE 06/29/2022 1734   UROBILINOGEN 0.2 06/27/2022 1706   UROBILINOGEN 0.2 05/02/2019 1054   NITRITE NEGATIVE 06/29/2022 1734   LEUKOCYTESUR LARGE (A) 06/29/2022 1734    Radiological Exams on Admission: CT ABDOMEN PELVIS W CONTRAST  Result Date: 06/29/2022 CLINICAL DATA:  Abdominal pain with weakness and vomiting. EXAM: CT ABDOMEN AND PELVIS WITH CONTRAST TECHNIQUE: Multidetector CT imaging of the abdomen and pelvis was performed using the standard protocol following bolus administration of intravenous contrast. RADIATION DOSE REDUCTION: This exam was performed according to the departmental dose-optimization program which includes automated exposure control, adjustment of the mA and/or kV according to patient size and/or use of iterative reconstruction technique. CONTRAST:  16m OMNIPAQUE IOHEXOL 350 MG/ML SOLN COMPARISON:  05/15/2019 FINDINGS: Lower chest: Unremarkable. Hepatobiliary: No suspicious focal abnormality within the liver parenchyma. There is no evidence for gallstones, gallbladder wall thickening, or pericholecystic fluid. No intrahepatic or extrahepatic biliary dilation. Pancreas: No focal mass lesion. No dilatation of the main duct. No intraparenchymal cyst. No peripancreatic edema. Spleen: No splenomegaly. No focal mass lesion. Adrenals/Urinary Tract: No adrenal nodule or mass. Small cysts are noted in the kidneys bilaterally. No followup imaging is recommended. Central sinus cysts are evident bilaterally on delayed imaging. No evidence for hydroureter. The urinary bladder appears normal for the degree of distention. Stomach/Bowel: Tiny hiatal hernia. Stomach is distended with fluid. Duodenum is normally positioned as is the ligament of Treitz. No small bowel wall thickening. No small bowel dilatation. The terminal ileum is normal. The appendix is not well visualized, but there  is no edema or inflammation in the region of the cecum. Colon is diffusely distended with fluid and stool. Vascular/Lymphatic: There is moderate atherosclerotic calcification of the abdominal aorta without aneurysm. There is no gastrohepatic or hepatoduodenal ligament lymphadenopathy. No retroperitoneal or mesenteric lymphadenopathy. No pelvic sidewall lymphadenopathy. Reproductive: Prostate gland is mildly enlarged.  Other: No intraperitoneal free fluid. Haziness with clustered lymph nodes in the central small bowel mesentery is similar to prior, likely reactive. Musculoskeletal: No worrisome lytic or sclerotic osseous abnormality. Lumbosacral fusion hardware evident bridging L2-3 and also L4-5-S1 L3-S1. IMPRESSION: 1. No acute findings in the abdomen or pelvis. Specifically, no findings to explain the patient's history of abdominal pain and vomiting. 2. Colon is diffusely distended with fluid and stool, nonspecific. No associated colonic wall thickening to suggest colitis. 3.  Aortic Atherosclerosis (ICD10-I70.0). Electronically Signed   By: Misty Stanley M.D.   On: 06/29/2022 15:12    Assessment/Plan Principal Problem:   Hyponatremia Active Problems:   RBBB   OSA on CPAP   Parkinson's disease    Hyponatremia, appear acute -Wife report he had history of hyponatremia several years ago -Sodium on presentation 121 ,no confusion -Received 1 L of normal saline in the ED, will check urine sodium/osmole/serum osmole/serum sodium/am cortisol level ( tsh was unremarkable) -Nephrology consulted, will follow recommendation  Ab pain/ nausea/Also reports diarrheax1 at home CT did not show colitis but Colon is diffusely distended with fluid and stool,  Will check stool studies Received Cipro and Flagyl in the ED, continue Flagyl, change Cipro to Rocephin Follow-up on blood cultures, and stool studies On full liquid diet for now  C/o heart burn Hold ppi until rule out cdiff Tums and pepcid ordered    UTI Urine culture from urgent care no growth, repeat urine culture Already on antibiotics  AKI; appear dehydrated, received hydration in the ER, further hydration to be directed by nephrology due to hyponatremia Repeat BMP in the morning, renal dosing meds  Parkinson's Missed medication for 2 days due to not feeling well, resume  Generalized pain, He is asking for Dilaudid, 0.5 mg every 4 hours as needed  h/o afib, appear to be postop aifb with no recurrence, was taken off anticoagulation by cardiologist Currently BP low normal, hold Cardizem, continue propranolol at lower dose with holding parameters Keep on telemetry  DVT prophylaxis: Lovenox   Code Status:   full Family Communication:  Wife   Patient is from: home   Anticipated DC to: home   Anticipated DC date:  TBD   Consults called:  Nephrology  Admission status:  Inpatient  Severity of Illness:   The appropriate patient status for this patient is INPATIENT due to history and comorbidities, severity of illness, required intensity of service to ensure the patient's safety and to avoid risk of adverse events/further clinical deterioration.  Severity of illness/comorbidities: Severe hyponatremia requiring frequent lab checks and monitoring Intensity of service: tests, high frequency of surveillance, interventions It is not anticipated that the patient will be medically stable for discharge from the hospital within 2 midnights of admission.    Voice Recognition Viviann Spare dictation system was used to create this note, attempts have been made to correct errors. Please contact the author with questions and/or clarifications.  Florencia Reasons MD PhD FACP [facility]  How to contact the [facility] Attending or Consulting provider [place] or covering provider during after hours [place], for this patient?   Check the care team in [facility] and look for a) attending/consulting [facility] provider listed and b) the [facility] team listed Log into  [email or web address] and use [place]'s universal password to access. If you do not have the password, please contact the hospital operator. Locate the [facility] provider you are looking for under [facility] and page to a number that you can be directly  reached. If you still have difficulty reaching the provider, please page the [facility] (Director on Call) for the Hospitalists listed on amion for assistance.  06/29/2022, 8:29 PM

## 2022-06-29 NOTE — ED Provider Triage Note (Signed)
Emergency Medicine Provider Triage Evaluation Note  [name] , a 77 y.o. male  was evaluated in triage.  Patient complains of weakness, emesis and fevers up to 102.  He reports that prior to this he was experiencing dysuria and went to urgent care.  He was started on Bactrim at that time.  He has been able to keep that down but says that he has been vomiting his Parkinson's medications so his tremor is getting worse.  Still experiencing dysuria and dark urine.  Review of Systems  Positive:  Negative:   Physical Exam  BP (!) 128/92   Pulse (!) 115   Temp 98.9 F (37.2 C) (Oral)   Resp 17   Ht '6\' 3"'$  (1.905 m)   Wt 95.3 kg   SpO2 95%   BMI 26.26 kg/m  Gen:   Awake, no distress   Resp:  Normal effort  MSK:   Moves extremities without difficulty  Other:  No CVA tenderness.  No abdominal tenderness.  Says that his abdomen feels "weak."  Medical Decision Making  Medically screening exam initiated at 12:47 PM.  Appropriate orders placed.  [name] was informed that the remainder of the evaluation will be completed by another provider, this initial triage assessment does not replace that evaluation, and the importance of remaining in the ED until their evaluation is complete.     Rhae Hammock, PA-C 06/29/22 1249

## 2022-06-29 NOTE — ED Provider Notes (Signed)
[facility] Provider Note   CSN: [id number] Arrival date & time: 06/29/22  1225     History  Chief Complaint  Patient presents with   Fever   Emesis   Shortness of Breath    [name] is a 77 y.o. male w/ parkinson's disease presenting to ED with fever, chills, weakness, nausea x several days.  He was diagnosed 2 days ago with a possible UTI at urgent care and started on bactrim; however his urine cultures have subsequently returned negative, and he is advised to come to the ED.  He continues to feel very nauseated.  He says has not been able to keep down anything due to nausea, has been vomiting his medications, including his Parkinson's medicines, which he has not had in 2 days.  He takes carbidopa levodopa 49-190.  He is here with his wife at bedside.  His last bowel movement was yesterday.  HPI     Home Medications Prior to Admission medications   Medication Sig Start Date End Date Taking? Authorizing Provider  acetaminophen (TYLENOL) 650 MG CR tablet Take 1,300 mg by mouth daily as needed for pain.    [provider]  Carbidopa-Levodopa ER (RYTARY) 48.75-195 MG CPCR Take by mouth. Take one capsule three times a day 8am/1pm/6pm    [provider]  diclofenac Sodium (VOLTAREN) 1 % GEL Apply 1 Application topically 4 (four) times daily as needed (pain.).    [provider]  diltiazem (CARDIZEM CD) 180 MG 24 hr capsule Take 1 capsule (180 mg total) by mouth daily. 04/13/22   Adrian Prows, MD  docusate sodium (COLACE) 100 MG capsule Take 1 capsule (100 mg total) by mouth 2 (two) times daily. 10/21/21   Newman Pies, MD  fluticasone [facility]) 50 MCG/ACT nasal spray Place 2 sprays into both nostrils daily. 03/09/20   Tonia Ghent, MD  omeprazole (PRILOSEC) 20 MG capsule Take 20 mg by mouth daily before breakfast.    [provider]  potassium chloride (KLOR-CON) 10 MEQ tablet TAKE 1 TABLET BY  MOUTH TWICE A DAY 11/18/21   Adrian Prows, MD  pramipexole (MIRAPEX) 0.5 MG tablet TAKE 1 TABLET(0.5 MG) BY MOUTH THREE TIMES DAILY 05/19/22   Tat, Eustace Quail, DO  propranolol (INDERAL) 20 MG tablet Take 1 tablet (20 mg total) by mouth 2 (two) times daily. 04/13/22   Adrian Prows, MD  tiZANidine (ZANAFLEX) 4 MG tablet Take 1 tablet (4 mg total) by mouth every 8 (eight) hours as needed for muscle spasms. 10/21/21   Newman Pies, MD      Allergies    Flexeril [cyclobenzaprine], Hctz [hydrochlorothiazide], Morphine and related, Phenergan [promethazine], Sinemet [carbidopa-levodopa], and Tambocor [flecainide]    Review of Systems   Review of Systems  Physical Exam Updated Vital Signs BP (!) 147/94 (BP Location: Right Arm)   Pulse (!) 105   Temp 99.9 F (37.7 C) (Oral)   Resp (!) 23   Ht '6\' 3"'$  (1.905 m)   Wt 95.3 kg   SpO2 98%   BMI 26.26 kg/m  Physical Exam Constitutional:      General: He is not in acute distress.    Comments: Significant parkinsonian tremors of the entire body  HENT:     Head: Normocephalic and atraumatic.  Eyes:     Conjunctiva/sclera: Conjunctivae normal.     Pupils: Pupils are equal, round, and reactive to light.  Cardiovascular:     Rate and Rhythm: Normal  rate and regular rhythm.  Pulmonary:     Effort: Pulmonary effort is normal. No respiratory distress.  Abdominal:     General: There is no distension.     Tenderness: There is abdominal tenderness in the left lower quadrant.  Skin:    General: Skin is warm and dry.  Neurological:     General: No focal deficit present.     Mental Status: He is alert and oriented to person, place, and time. Mental status is at baseline.  Psychiatric:        Mood and Affect: Mood normal.        Behavior: Behavior normal.     ED Results / Procedures / Treatments   Labs (all labs ordered are listed, but only abnormal results are displayed) Labs Reviewed  CBC - Abnormal; Notable for the following components:      Result  Value   HCT 38.1 (*)    MCHC 36.7 (*)    All other components within normal limits  COMPREHENSIVE METABOLIC PANEL - Abnormal; Notable for the following components:   Sodium 121 (*)    Chloride 91 (*)    CO2 20 (*)    Glucose, Bld 112 (*)    Creatinine, Ser 1.38 (*)    Calcium 8.5 (*)    GFR, Estimated 53 (*)    All other components within normal limits  CULTURE, BLOOD (ROUTINE X 2)  CULTURE, BLOOD (ROUTINE X 2)  RESP PANEL BY RT-PCR (RSV, FLU A&B, COVID)  RVPGX2  LIPASE, BLOOD  LACTIC ACID, PLASMA  URINALYSIS, ROUTINE W REFLEX MICROSCOPIC  CBG MONITORING, ED    EKG EKG Interpretation  Date/Time:  Thursday June 29 2022 12:24:40 EDT Ventricular Rate:  117 PR Interval:  170 QRS Duration: 112 QT Interval:  322 QTC Calculation: 449 R Axis:   69 Text Interpretation: Sinus tachycardia with Fusion complexes Incomplete right bundle branch block Borderline ECG When compared with ECG of 25-Apr-2019 10:16, PREVIOUS ECG IS PRESENT Confirmed by Octaviano Glow [phone]) on 06/29/2022 4:37:37 PM  Radiology CT ABDOMEN PELVIS W CONTRAST  Result Date: 06/29/2022 CLINICAL DATA:  Abdominal pain with weakness and vomiting. EXAM: CT ABDOMEN AND PELVIS WITH CONTRAST TECHNIQUE: Multidetector CT imaging of the abdomen and pelvis was performed using the standard protocol following bolus administration of intravenous contrast. RADIATION DOSE REDUCTION: This exam was performed according to the departmental dose-optimization program which includes automated exposure control, adjustment of the mA and/or kV according to patient size and/or use of iterative reconstruction technique. CONTRAST:  69m OMNIPAQUE IOHEXOL 350 MG/ML SOLN COMPARISON:  05/15/2019 FINDINGS: Lower chest: Unremarkable. Hepatobiliary: No suspicious focal abnormality within the liver parenchyma. There is no evidence for gallstones, gallbladder wall thickening, or pericholecystic fluid. No intrahepatic or extrahepatic biliary dilation.  Pancreas: No focal mass lesion. No dilatation of the main duct. No intraparenchymal cyst. No peripancreatic edema. Spleen: No splenomegaly. No focal mass lesion. Adrenals/Urinary Tract: No adrenal nodule or mass. Small cysts are noted in the kidneys bilaterally. No followup imaging is recommended. Central sinus cysts are evident bilaterally on delayed imaging. No evidence for hydroureter. The urinary bladder appears normal for the degree of distention. Stomach/Bowel: Tiny hiatal hernia. Stomach is distended with fluid. Duodenum is normally positioned as is the ligament of Treitz. No small bowel wall thickening. No small bowel dilatation. The terminal ileum is normal. The appendix is not well visualized, but there is no edema or inflammation in the region of the cecum. Colon is diffusely distended with fluid and  stool. Vascular/Lymphatic: There is moderate atherosclerotic calcification of the abdominal aorta without aneurysm. There is no gastrohepatic or hepatoduodenal ligament lymphadenopathy. No retroperitoneal or mesenteric lymphadenopathy. No pelvic sidewall lymphadenopathy. Reproductive: Prostate gland is mildly enlarged. Other: No intraperitoneal free fluid. Haziness with clustered lymph nodes in the central small bowel mesentery is similar to prior, likely reactive. Musculoskeletal: No worrisome lytic or sclerotic osseous abnormality. Lumbosacral fusion hardware evident bridging L2-3 and also L4-5-S1 L3-S1. IMPRESSION: 1. No acute findings in the abdomen or pelvis. Specifically, no findings to explain the patient's history of abdominal pain and vomiting. 2. Colon is diffusely distended with fluid and stool, nonspecific. No associated colonic wall thickening to suggest colitis. 3.  Aortic Atherosclerosis (ICD10-I70.0). Electronically Signed   By: Misty Stanley M.D.   On: 06/29/2022 15:12    Procedures Procedures    Medications Ordered in ED Medications  ciprofloxacin (CIPRO) IVPB 400 mg (has no  administration in time range)  metroNIDAZOLE (FLAGYL) IVPB 500 mg (has no administration in time range)  carbidopa-levodopa (SINEMET CR) 50-200 MG per tablet controlled release 1 tablet (has no administration in time range)  ondansetron (ZOFRAN) injection 4 mg (4 mg Intravenous Given 06/29/22 1322)  sodium chloride 0.9 % bolus 1,000 mL (1,000 mLs Intravenous New Bag/Given 06/29/22 1626)  iohexol (OMNIPAQUE) 350 MG/ML injection 75 mL (75 mLs Intravenous Contrast Given 06/29/22 1434)    ED Course/ Medical Decision Making/ A&P                             Medical Decision Making Amount and/or Complexity of Data Reviewed Labs: ordered.  Risk Prescription drug management. Decision regarding hospitalization.   This patient presents to the ED with concern for nausea, vomiting, fevers and chills. This involves an extensive number of treatment options, and is a complaint that carries with it a high risk of complications and morbidity.  The differential diagnosis includes viral illness versus diverticulitis versus colitis versus other GI pathology versus pancreatitis  Additional history obtained from patient's wife at bedside  External records from outside source obtained and reviewed including urine cultures from March 12 that are no growth to date  I ordered and personally interpreted labs.  The pertinent results include:  Hyponatremia.  White blood cell count and lactate within normal limits.  UA and covid pending.  I ordered imaging studies including CT abdomen pelvis I independently visualized and interpreted imaging which showed colonic thickening consistent with colitis I agree with the radiologist interpretation  The patient was maintained on a cardiac monitor.  I personally viewed and interpreted the cardiac monitored which showed an underlying rhythm of: Sinus rhythm and sinus tachycardia with significant derangement due to the patient's parkinsonian tremor  Per my interpretation the  patient's ECG shows sinus tachycardia no acute ischemic findings  I ordered medication including IV normal saline for hyponatremia.  IV ciprofloxacin and Flagyl for colitis/intra-abdominal infection.  I reordered the patient's carbidopa medication after he has received Zofran with the hope that he can keep down this medication  I have reviewed the patients home medicines and have made adjustments as needed  Test Considered: Low suspicion for acute PE or meningitis.  After the interventions noted above, I reevaluated the patient and found that they have: stayed the same   Dispostion:  After consideration of the diagnostic results and the patients response to treatment, I feel that the patent would benefit from medical admission.  At this time the patient  has a normal lactate and white blood cell count, does not demonstrate evidence of sepsis.  He has no confusion, seizure-like activity, or immediate concern for hyponatremia, which is of unclear etiology but his sodium levels appear to been trending down the past few months.  I do not believe he is requiring hypertonic saline or intensive care admission at this time.  I will reach out to the hospital team         Final Clinical Impression(s) / ED Diagnoses Final diagnoses:  Hyponatremia  Colitis    Rx / DC Orders ED Discharge Orders     None         Wyvonnia Dusky, MD 06/29/22 2128

## 2022-06-29 NOTE — ED Triage Notes (Signed)
Pt c/o fever up to 102, SOB, weakness, vomiting, dysuria, frequencyx3wks. Pt states he was told by UC that his urine showed he has a UTI.

## 2022-06-29 NOTE — Progress Notes (Signed)
Virtual visit completed through WebEx or similar program Patient location: home  Provider location: [facility], office  Participants: Patient and me (unless stated otherwise below)  Limitations and rationale for visit method d/w patient.  Patient agreed to proceed.   CC:  HPI:  Recently back from [place].  He had back pain and burning with urination.  Went to UC.  Still with nausea, headache, chills, vomited yesterday, no appetite, prev felt like legs and feet on fire, urine is dark. Went to UC the other day and given Bactrim but was told today to stop it and see his PCP.  Patient has been taking tylenol for fever and sx. He had 3 doses of septra in the meantime.  Still with burning with urination.  Temp 102 last night.  He still has dark urine with urinary frequency.  AZO prev helped some with urinary sx.    He is getting lightheaded and this is clearly different from his baseline.  We talked about ER eval at [facility].  Wife can transport patient to ER.    Meds and allergies reviewed.   ROS: Per HPI unless specifically indicated in ROS section   NAD Speech wnl  A/P: Dysuria, with ucx neg but clearly LUTS with lightheadedness/fever and lack of improvement.  He may end up needing IV fluids and abx, in spite of neg ucx.  I called [facility] ER triage about pending arrival.

## 2022-06-30 DIAGNOSIS — E871 Hypo-osmolality and hyponatremia: Secondary | ICD-10-CM | POA: Diagnosis not present

## 2022-06-30 LAB — CBC
HCT: 32.6 % — ABNORMAL LOW (ref 39.0–52.0)
Hemoglobin: 11.9 g/dL — ABNORMAL LOW (ref 13.0–17.0)
MCH: 31.5 pg (ref 26.0–34.0)
MCHC: 36.5 g/dL — ABNORMAL HIGH (ref 30.0–36.0)
MCV: 86.2 fL (ref 80.0–100.0)
Platelets: 160 10*3/uL (ref 150–400)
RBC: 3.78 MIL/uL — ABNORMAL LOW (ref 4.22–5.81)
RDW: 11.6 % (ref 11.5–15.5)
WBC: 6.7 10*3/uL (ref 4.0–10.5)
nRBC: 0 % (ref 0.0–0.2)

## 2022-06-30 LAB — SODIUM
Sodium: 123 mmol/L — ABNORMAL LOW (ref 135–145)
Sodium: 121 mmol/L — ABNORMAL LOW (ref 135–145)
Sodium: 124 mmol/L — ABNORMAL LOW (ref 135–145)
Sodium: 124 mmol/L — ABNORMAL LOW (ref 135–145)
Sodium: 124 mmol/L — ABNORMAL LOW (ref 135–145)
Sodium: 127 mmol/L — ABNORMAL LOW (ref 135–145)

## 2022-06-30 LAB — MAGNESIUM: Magnesium: 1.5 mg/dL — ABNORMAL LOW (ref 1.7–2.4)

## 2022-06-30 LAB — CORTISOL: Cortisol, Plasma: 11.7 ug/dL

## 2022-06-30 LAB — BASIC METABOLIC PANEL
Anion gap: 7 (ref 5–15)
BUN: 10 mg/dL (ref 8–23)
CO2: 21 mmol/L — ABNORMAL LOW (ref 22–32)
Calcium: 7.9 mg/dL — ABNORMAL LOW (ref 8.9–10.3)
Chloride: 95 mmol/L — ABNORMAL LOW (ref 98–111)
Creatinine, Ser: 1.47 mg/dL — ABNORMAL HIGH (ref 0.61–1.24)
GFR, Estimated: 49 mL/min — ABNORMAL LOW (ref >60)
Glucose, Bld: 89 mg/dL (ref 70–99)
Potassium: 4.1 mmol/L (ref 3.5–5.1)
Sodium: 123 mmol/L — ABNORMAL LOW (ref 135–145)

## 2022-06-30 MED ORDER — LIDOCAINE 5 % EX PTCH
1.0000 | MEDICATED_PATCH | CUTANEOUS | Status: DC
  Administered 2022-06-30 – 2022-07-01 (×2): 1 via TRANSDERMAL
  Filled 2022-06-30 (×2): qty 1

## 2022-06-30 MED ORDER — SENNOSIDES-DOCUSATE SODIUM 8.6-50 MG PO TABS
1.0000 | ORAL_TABLET | Freq: Two times a day (BID) | ORAL | Status: DC
  Administered 2022-06-30 – 2022-07-02 (×5): 1 via ORAL
  Filled 2022-06-30 (×5): qty 1

## 2022-06-30 MED ORDER — ENOXAPARIN SODIUM 40 MG/0.4ML IJ SOSY
40.0000 mg | PREFILLED_SYRINGE | INTRAMUSCULAR | Status: DC
  Administered 2022-06-30 – 2022-07-01 (×2): 40 mg via SUBCUTANEOUS
  Filled 2022-06-30 (×2): qty 0.4

## 2022-06-30 MED ORDER — PANTOPRAZOLE SODIUM 20 MG PO TBEC
20.0000 mg | DELAYED_RELEASE_TABLET | Freq: Every day | ORAL | Status: DC
  Administered 2022-06-30 – 2022-07-02 (×3): 20 mg via ORAL
  Filled 2022-06-30 (×3): qty 1

## 2022-06-30 MED ORDER — PHENOL 1.4 % MT LIQD
1.0000 | OROMUCOSAL | Status: DC | PRN
  Administered 2022-06-30: 1 via OROMUCOSAL
  Filled 2022-06-30: qty 177

## 2022-06-30 MED ORDER — MELATONIN 5 MG PO TABS
5.0000 mg | ORAL_TABLET | Freq: Every day | ORAL | Status: DC
  Administered 2022-06-30 – 2022-07-01 (×2): 5 mg via ORAL
  Filled 2022-06-30 (×2): qty 1

## 2022-06-30 MED ORDER — POLYETHYLENE GLYCOL 3350 17 G PO PACK
17.0000 g | PACK | Freq: Every day | ORAL | Status: DC
  Administered 2022-06-30 – 2022-07-02 (×3): 17 g via ORAL
  Filled 2022-06-30 (×3): qty 1

## 2022-06-30 MED ORDER — DIPHENHYDRAMINE HCL 50 MG/ML IJ SOLN
12.5000 mg | Freq: Three times a day (TID) | INTRAMUSCULAR | Status: DC | PRN
  Filled 2022-06-30: qty 1

## 2022-06-30 MED ORDER — MAGNESIUM SULFATE IN D5W 1-5 GM/100ML-% IV SOLN
1.0000 g | Freq: Once | INTRAVENOUS | Status: AC
  Administered 2022-06-30: 1 g via INTRAVENOUS
  Filled 2022-06-30: qty 100

## 2022-06-30 NOTE — Progress Notes (Signed)
Pt home med of carbidopa levodopa sent to pharmacy to be dispensed during hospital stay.

## 2022-06-30 NOTE — Care Management (Signed)
  Transition of Care [facility]) Screening Note   Patient Details  Name: [name] Date of Birth: Apr 17, 1946   Transition of Care [facility]) CM/SW Contact:    Levonne Lapping, RN Phone Number: 06/30/2022, 2:57 PM    Transition of Care Department [facility]) has reviewed patient and no TOC needs have been identified at this time. We will continue to monitor patient advancement through interdisciplinary progression rounds. If new patient transition needs arise, please place a TOC consult.

## 2022-06-30 NOTE — Progress Notes (Signed)
Pt on room air and tolerating. Pt refusing CPAP QHS adv hasn't needed to use since weight loss. Will continue to monitor for any additional changes

## 2022-06-30 NOTE — Progress Notes (Signed)
Patient ID: [name], male   DOB: 12/13/1945, 77 y.o.   MRN: [id number] S: Feeling better today.  Still complaining of urinary frequency.  O:BP 111/75 (BP Location: Left Arm)   Pulse 72   Temp 97.7 F (36.5 C) (Oral)   Resp 15   Ht 6\' 3"  (1.905 m)   Wt 95.3 kg   SpO2 92%   BMI 26.26 kg/m   Intake/Output Summary (Last 24 hours) at 06/30/2022 1417 Last data filed at 06/30/2022 1400 Gross per 24 hour  Intake 250 ml  Output 435 ml  Net -185 ml   Intake/Output: I/O last 3 completed shifts: In: 250 [IV Piggyback:250] Out: -   Intake/Output this shift:  Total I/O In: -  Out: 435 [Urine:435] Weight change:  Gen:NAD CVS: RRR Resp: CTA Abd: _+BS, soft, NT/ND Ext: no edema  Recent Labs  Lab 06/29/22 1336 06/29/22 2030 06/29/22 2231 06/30/22 0100 06/30/22 0431 06/30/22 0806 06/30/22 1052  NA 121* 121* 121* 123* 121* 123* 124*  K 3.9  --   --   --   --  4.1  --   CL 91*  --   --   --   --  95*  --   CO2 20*  --   --   --   --  21*  --   GLUCOSE 112*  --   --   --   --  89  --   BUN 11  --   --   --   --  10  --   CREATININE 1.38*  --   --   --   --  1.47*  --   ALBUMIN 3.6  --   --   --   --   --   --   CALCIUM 8.5*  --   --   --   --  7.9*  --   AST 20  --   --   --   --   --   --   ALT 15  --   --   --   --   --   --    Liver Function Tests: Recent Labs  Lab 06/29/22 1336  AST 20  ALT 15  ALKPHOS 64  BILITOT 1.0  PROT 6.7  ALBUMIN 3.6   Recent Labs  Lab 06/29/22 1336  LIPASE 32   No results for input(s): "AMMONIA" in the last 168 hours. CBC: Recent Labs  Lab 06/29/22 1336 06/30/22 0431  WBC 8.3 6.7  HGB 14.0 11.9*  HCT 38.1* 32.6*  MCV 86.2 86.2  PLT 217 160   Cardiac Enzymes: No results for input(s): "CKTOTAL", "CKMB", "CKMBINDEX", "TROPONINI" in the last 168 hours. CBG: No results for input(s): "GLUCAP" in the last 168 hours.  Iron Studies: No results for input(s): "IRON", "TIBC", "TRANSFERRIN", "FERRITIN" in the last 72  hours. Studies/Results: CT ABDOMEN PELVIS W CONTRAST  Result Date: 06/29/2022 CLINICAL DATA:  Abdominal pain with weakness and vomiting. EXAM: CT ABDOMEN AND PELVIS WITH CONTRAST TECHNIQUE: Multidetector CT imaging of the abdomen and pelvis was performed using the standard protocol following bolus administration of intravenous contrast. RADIATION DOSE REDUCTION: This exam was performed according to the departmental dose-optimization program which includes automated exposure control, adjustment of the mA and/or kV according to patient size and/or use of iterative reconstruction technique. CONTRAST:  87mL OMNIPAQUE IOHEXOL 350 MG/ML SOLN COMPARISON:  05/15/2019 FINDINGS: Lower chest: Unremarkable. Hepatobiliary: No suspicious focal abnormality within the liver parenchyma.  There is no evidence for gallstones, gallbladder wall thickening, or pericholecystic fluid. No intrahepatic or extrahepatic biliary dilation. Pancreas: No focal mass lesion. No dilatation of the main duct. No intraparenchymal cyst. No peripancreatic edema. Spleen: No splenomegaly. No focal mass lesion. Adrenals/Urinary Tract: No adrenal nodule or mass. Small cysts are noted in the kidneys bilaterally. No followup imaging is recommended. Central sinus cysts are evident bilaterally on delayed imaging. No evidence for hydroureter. The urinary bladder appears normal for the degree of distention. Stomach/Bowel: Tiny hiatal hernia. Stomach is distended with fluid. Duodenum is normally positioned as is the ligament of Treitz. No small bowel wall thickening. No small bowel dilatation. The terminal ileum is normal. The appendix is not well visualized, but there is no edema or inflammation in the region of the cecum. Colon is diffusely distended with fluid and stool. Vascular/Lymphatic: There is moderate atherosclerotic calcification of the abdominal aorta without aneurysm. There is no gastrohepatic or hepatoduodenal ligament lymphadenopathy. No  retroperitoneal or mesenteric lymphadenopathy. No pelvic sidewall lymphadenopathy. Reproductive: Prostate gland is mildly enlarged. Other: No intraperitoneal free fluid. Haziness with clustered lymph nodes in the central small bowel mesentery is similar to prior, likely reactive. Musculoskeletal: No worrisome lytic or sclerotic osseous abnormality. Lumbosacral fusion hardware evident bridging L2-3 and also L4-5-S1 L3-S1. IMPRESSION: 1. No acute findings in the abdomen or pelvis. Specifically, no findings to explain the patient's history of abdominal pain and vomiting. 2. Colon is diffusely distended with fluid and stool, nonspecific. No associated colonic wall thickening to suggest colitis. 3.  Aortic Atherosclerosis (ICD10-I70.0). Electronically Signed   By: Misty Stanley M.D.   On: 06/29/2022 15:12    calcium carbonate  1 tablet Oral BID   Carbidopa-Levodopa ER  1 capsule Oral See admin instructions   enoxaparin (LOVENOX) injection  40 mg Subcutaneous Q24H   pramipexole  0.5 mg Oral TID   propranolol  10 mg Oral BID    BMET    Component Value Date/Time   NA 124 (L) 06/30/2022 1052   NA 139 07/01/2020 1021   K 4.1 06/30/2022 0806   CL 95 (L) 06/30/2022 0806   CO2 21 (L) 06/30/2022 0806   GLUCOSE 89 06/30/2022 0806   BUN 10 06/30/2022 0806   BUN 12 07/01/2020 1021   CREATININE 1.47 (H) 06/30/2022 0806   CREATININE 1.06 01/20/2022 0858   CALCIUM 7.9 (L) 06/30/2022 0806   GFRNONAA 49 (L) 06/30/2022 0806   GFRNONAA 54 (L) 01/14/2019 0958   GFRAA >60 05/17/2019 1137   GFRAA 62 01/14/2019 0958   CBC    Component Value Date/Time   WBC 6.7 06/30/2022 0431   RBC 3.78 (L) 06/30/2022 0431   HGB 11.9 (L) 06/30/2022 0431   HCT 32.6 (L) 06/30/2022 0431   PLT 160 06/30/2022 0431   MCV 86.2 06/30/2022 0431   MCH 31.5 06/30/2022 0431   MCHC 36.5 (H) 06/30/2022 0431   RDW 11.6 06/30/2022 0431   LYMPHSABS 1,825 01/20/2022 0858   MONOABS 0.5 07/15/2021 1230   EOSABS 150 01/20/2022 0858    BASOSABS 47 01/20/2022 0858     Assessment/Plan:  Hyponatremia - by history would suggest hypovolemic hyponatremia given N/V and poor po intake prior to admission, however UNa was 32, Uosm 268, both inappropriately high suggesting SIADH.  His sodium has increased slowly to 124 with fluid restriction, however the Una and Uosm were obtained 1 hour after he received a bolus of IV NS which may have affected the results.  Will repeat Uosm and  Ardelia Mems today and if still inappropriately elevated would continue with fluid restriction.  Currently asymptomatic.  Serum sodium not at goal, if does not improve, would start Ure-Na or NaCl tabs tomorrow. Fevers, N/V/dysuria, abdominal pain - urine culture negative 06/27/22.  Currently on cipro and flagyl per primary svc.  CT scan without evidence of colitis. AKI - possibly related to volume depletion, await repeat urine studies and may need to resume IVF's.  Donetta Potts, MD [facility]

## 2022-06-30 NOTE — Progress Notes (Addendum)
PROGRESS NOTE    [name]  [id number] DOB: 11-09-1945 DOA: 06/29/2022 PCP: Tonia Ghent, MD     Brief Narrative:   of Parkinson's, chronic RBBB, post op AF in Dec 2020, was taken off  Xarelto due to no recurrence , OSA on CPAP, presented to ED due to not feeling well for a week, had abdominal pain , diarrheax1 , nausea not able to take Parkinson's medication for the last 2 days, has worsening tremors, reports fever 102 at home   Subjective:  Last bm was Tuesday, no more diarrhea ab pain has much improved, ab is less distended, still feels bloated and sore from the vomiting at home  Last vomited the night before last   Last felt nauseous yesterday  Dysuria resolved today  Appetite is better, would like to try to eat more  "I feel I am human today, yesterday I felt I was dying "  Assessment & Plan:  Principal Problem:   Hyponatremia Active Problems:   RBBB   OSA on CPAP   Parkinson's disease    Assessment and Plan:   Hyponatremia, appear acute -Wife report he had history of hyponatremia several years ago -Sodium on presentation 121 ,no confusion, initially received ivf -Unremarkable TSH and a.m. 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 , nephrology input appreciated , repeat urine sodium and osmolality  -Sodium 124 today -Will follow recommendation  AKI; CT ab/pel no obstructive nephropathy He  appears is dehydrated, received hydration in the ER, further hydration to be directed by nephrology due to hyponatremia Repeat BMP in the morning, renal dosing meds   Ab pain/ distention /nausea/Also reports diarrheax1 at home CT did not show colitis but Colon is diffusely distended with fluid and stool,  Reports not bm since Tuesday  blood cultures no growth Received Cipro and Flagyl in the ED, continue Flagyl, change Cipro to Rocephin, continue antibiotics since clinically improved with the treatment Advance diet as tolerated     GERD C/o heart burn, think from throwing up Ppi, Tums and pepcid ordered    UTI Urine culture from urgent care no growth, ua collected on admission does not appear reflexed  Already on antibiotics Reports dysuria resolved today, continue abx to finished total of 5 days treatment     Parkinson's Missed medication for 2 days due to not feeling well, resume   Generalized pain, Reports Dilaudid helped   Chronic hip pain and back pain, prn analgesics and topical lidocaine patch    h/o afib, appear to be postop aifb with no recurrence, was taken off anticoagulation by cardiologist Currently BP low normal, hold Cardizem, continue propranolol at lower dose with holding parameters Keep on telemetry   FTT, reports a lot weaker than normal, he uses a cane at baseline due to parkinson's , he thinks he will need a walker due to weakness, PT     H/o OSA, appear improved after weight loss, reports has not needed cpap since weight loss  I have Reviewed nursing notes, Vitals, pain scores, I/o's, Lab results and  imaging results since pt's last encounter, details please see discussion above  I ordered the following labs:  Unresulted Labs (From admission, onward)     Start     Ordered   07/06/22 0500  Creatinine, serum  (enoxaparin (LOVENOX)    CrCl < 30 ml/min)  Once,   R       Comments: while on enoxaparin therapy.    06/29/22 2040  07/01/22 [id number]  Basic metabolic panel  Tomorrow morning,   R       Question:  Specimen collection method  Answer:  Lab=Lab collect   06/30/22 0821   07/01/22 0500  CBC with Differential/Platelet  Tomorrow morning,   R       Question:  Specimen collection method  Answer:  Lab=Lab collect   06/30/22 0821   07/01/22 0500  Magnesium  Tomorrow morning,   R       Question:  Specimen collection method  Answer:  Lab=Lab collect   06/30/22 0821   07/01/22 0500  Phosphorus  Tomorrow morning,   R       Question:  Specimen collection method  Answer:  Lab=Lab  collect   06/30/22 0821   06/30/22 1311  Sodium, urine, random  Once,   R        06/30/22 1310   06/30/22 1311  Osmolality, urine  Once,   R        06/30/22 1310   06/29/22 1920  Sodium  Now then every 2 hours,   R (with TIMED occurrences)      06/29/22 1727   06/29/22 1857  Urinalysis, w/ Reflex to Culture (Infection Suspected) -Urine, Clean Catch  (Urine Labs)  Once,   R       Question:  Specimen Source  Answer:  Urine, Clean Catch   06/29/22 1856   Signed and Held  Comprehensive metabolic panel  Tomorrow morning,   R        Signed and Held             DVT prophylaxis: enoxaparin (LOVENOX) injection 40 mg Start: 06/30/22 2200   Code Status:   Code Status: Full Code  Family Communication: wife at bedside on 3/14 Disposition:   Dispo: The patient is from: home              Anticipated d/c is to: home              Anticipated d/c date is: >24hrs, awaiting sodium and cr improvement, needs nephrology clearance   Antimicrobials:    Anti-infectives (From admission, onward)    Start     Dose/Rate Route Frequency Ordered Stop   06/30/22 0532  metroNIDAZOLE (FLAGYL) IVPB 500 mg        500 mg 100 mL/hr over 60 Minutes Intravenous Every 12 hours 06/29/22 2011     06/30/22 0000  cefTRIAXone (ROCEPHIN) 2 g in sodium chloride 0.9 % 100 mL IVPB        2 g 200 mL/hr over 30 Minutes Intravenous Every 24 hours 06/29/22 2011     06/29/22 1630  ciprofloxacin (CIPRO) IVPB 400 mg        400 mg 200 mL/hr over 60 Minutes Intravenous  Once 06/29/22 1627 06/29/22 1733   06/29/22 1630  metroNIDAZOLE (FLAGYL) IVPB 500 mg  Status:  Discontinued        500 mg 100 mL/hr over 60 Minutes Intravenous Every 8 hours 06/29/22 1627 06/29/22 2011          Objective: Vitals:   06/30/22 0000 06/30/22 0326 06/30/22 0839 06/30/22 1228  BP: 106/69 99/70 96/68  111/75  Pulse: 80 69 63 72  Resp: 16 16 12 15   Temp:  98.3 F (36.8 C) 98 F (36.7 C) 97.7 F (36.5 C)  TempSrc:  Oral Oral Oral   SpO2: 95% 94% 95% 92%  Weight:      Height:  Intake/Output Summary (Last 24 hours) at 06/30/2022 1619 Last data filed at 06/30/2022 1500 Gross per 24 hour  Intake 250 ml  Output 435 ml  Net -185 ml   Filed Weights   06/29/22 1235  Weight: 95.3 kg    Examination:  General exam: alert, awake, communicative,calm, NAD, less tremors Respiratory system: Clear to auscultation. Respiratory effort normal. Cardiovascular system:  RRR.  Gastrointestinal system: Abdomen is nondistended, soft and nontender.  Normal bowel sounds heard. Central nervous system: Alert and oriented. No focal neurological deficits. Extremities:  no edema Skin: No rashes, lesions or ulcers Psychiatry: Judgement and insight appear normal. Mood & affect appropriate.     Data Reviewed: I have personally reviewed  labs and visualized  imaging studies since the last encounter and formulate the plan        Scheduled Meds:  calcium carbonate  1 tablet Oral BID   Carbidopa-Levodopa ER  1 capsule Oral See admin instructions   enoxaparin (LOVENOX) injection  40 mg Subcutaneous Q24H   lidocaine  1 patch Transdermal Q24H   melatonin  5 mg Oral QHS   pantoprazole  20 mg Oral Daily   polyethylene glycol  17 g Oral Daily   pramipexole  0.5 mg Oral TID   propranolol  10 mg Oral BID   senna-docusate  1 tablet Oral BID   Continuous Infusions:  cefTRIAXone (ROCEPHIN)  IV 2 g (06/29/22 2358)   famotidine (PEPCID) IV 20 mg (06/29/22 2114)   metronidazole 500 mg (06/30/22 0442)     LOS: 1 day   Time spent:  73mins  Florencia Reasons, MD PhD FACP [facility]  Available via Epic secure chat 7am-7pm for nonurgent issues Please page for urgent issues To page the attending provider between 7A-7P or the covering provider during after hours 7P-7A, please log into the web site [email or web address] and access using universal [place] password for that web site. If you do not have the password, please call the hospital  operator.    06/30/2022, 4:19 PM

## 2022-06-30 NOTE — Progress Notes (Signed)
Mobility Specialist Progress Note   06/30/22 1540  Mobility  Activity Dangled on edge of bed  Level of Assistance Minimal assist, patient does 75% or more  Assistive Device Other (Comment) (HHA)  Activity Response Tolerated well  Mobility Referral Yes  $Mobility charge 1 Mobility   Pt requesting assistance to sit up for dinner, agreeable to dangling EOB. MinA to sit up w/ UE support to pull trunk upright. No faults on transition, left w/ tray in front and bed alarm on.  [place] Falling Mobility Specialist Please contact via SecureChat or  Rehab office at [phone]

## 2022-07-01 DIAGNOSIS — E871 Hypo-osmolality and hyponatremia: Secondary | ICD-10-CM | POA: Diagnosis not present

## 2022-07-01 LAB — SODIUM
Sodium: 129 mmol/L — ABNORMAL LOW (ref 135–145)
Sodium: 130 mmol/L — ABNORMAL LOW (ref 135–145)
Sodium: 131 mmol/L — ABNORMAL LOW (ref 135–145)

## 2022-07-01 LAB — CBC WITH DIFFERENTIAL/PLATELET
Abs Immature Granulocytes: 0.09 10*3/uL — ABNORMAL HIGH (ref 0.00–0.07)
Basophils Absolute: 0 10*3/uL (ref 0.0–0.1)
Basophils Relative: 0 %
Eosinophils Absolute: 0.2 10*3/uL (ref 0.0–0.5)
Eosinophils Relative: 4 %
HCT: 33.8 % — ABNORMAL LOW (ref 39.0–52.0)
Hemoglobin: 12.4 g/dL — ABNORMAL LOW (ref 13.0–17.0)
Immature Granulocytes: 2 %
Lymphocytes Relative: 24 %
Lymphs Abs: 1.4 10*3/uL (ref 0.7–4.0)
MCH: 32 pg (ref 26.0–34.0)
MCHC: 36.7 g/dL — ABNORMAL HIGH (ref 30.0–36.0)
MCV: 87.3 fL (ref 80.0–100.0)
Monocytes Absolute: 0.8 10*3/uL (ref 0.1–1.0)
Monocytes Relative: 13 %
Neutro Abs: 3.5 10*3/uL (ref 1.7–7.7)
Neutrophils Relative %: 57 %
Platelets: 174 10*3/uL (ref 150–400)
RBC: 3.87 MIL/uL — ABNORMAL LOW (ref 4.22–5.81)
RDW: 11.9 % (ref 11.5–15.5)
WBC: 6 10*3/uL (ref 4.0–10.5)
nRBC: 0 % (ref 0.0–0.2)

## 2022-07-01 LAB — BASIC METABOLIC PANEL
Anion gap: 7 (ref 5–15)
BUN: 9 mg/dL (ref 8–23)
CO2: 22 mmol/L (ref 22–32)
Calcium: 8.1 mg/dL — ABNORMAL LOW (ref 8.9–10.3)
Chloride: 100 mmol/L (ref 98–111)
Creatinine, Ser: 1.21 mg/dL (ref 0.61–1.24)
GFR, Estimated: >60 mL/min (ref >60)
Glucose, Bld: 83 mg/dL (ref 70–99)
Potassium: 4.1 mmol/L (ref 3.5–5.1)
Sodium: 129 mmol/L — ABNORMAL LOW (ref 135–145)

## 2022-07-01 LAB — MAGNESIUM: Magnesium: 2 mg/dL (ref 1.7–2.4)

## 2022-07-01 LAB — PHOSPHORUS: Phosphorus: 3.5 mg/dL (ref 2.5–4.6)

## 2022-07-01 MED ORDER — LIDOCAINE 5 % EX PTCH
1.0000 | MEDICATED_PATCH | CUTANEOUS | Status: DC
  Administered 2022-07-01: 1 via TRANSDERMAL
  Filled 2022-07-01: qty 1

## 2022-07-01 NOTE — Progress Notes (Signed)
PROGRESS NOTE    [name]  [id number] DOB: 11/14/1945 DOA: 06/29/2022 PCP: Tonia Ghent, MD     Brief Narrative:   of Parkinson's, chronic RBBB, post op AF in Dec 2020, was taken off  Xarelto due to no recurrence , OSA on CPAP, presented to ED due to not feeling well for a week, had abdominal pain , diarrheax1 , nausea not able to take Parkinson's medication for the last 2 days, has worsening tremors, reports fever 102 at home   Subjective:  Sodium 130 Creatinine 1.2  He is improving, no n/v, no ab pain, tolerated soft diet, would like to have diet advanced to regular  Remain weak, seen by physical therapy this am   Dysuria resolved , but still has hesitancy and night time symptoms which has been for about three months, he is not ready to take flomax yet due to concerns for side effects he read about, he is going to see urology this month   Assessment & Plan:  Principal Problem:   Hyponatremia Active Problems:   RBBB   OSA on CPAP   Parkinson's disease    Assessment and Plan:   Hyponatremia, appear acute -Wife report he had history of hyponatremia several years ago -Sodium on presentation 121 ,no confusion, initially received ivf -Unremarkable TSH and a.m. 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 ,  -slowly improving, eating more as well, Sodium 130 today -appreciate nephrology input  AKI; CT ab/pel no obstructive nephropathy Likely from dehydration from gi loss Eating more, Cr improved, renal dosing meds   Ab pain/ distention /nausea/Also reports diarrheax1 at home CT did not show colitis but Colon is diffusely distended with fluid and stool,  Reports not bm since Tuesday  blood cultures no growth Received Cipro and Flagyl in the ED, continue Flagyl, change Cipro to Rocephin, continue antibiotics since clinically improved with the treatment Advance diet as tolerated    GERD C/o heart burn, think from throwing up Ppi,  Tums and pepcid ordered    UTI? Does has urinary symptoms Urine culture from urgent care no growth, ua collected on admission does not appear reflexed  Already on antibiotics Dysuria has  resolved , continue abx to finished total of 5 days treatment, suspect underline BPH for which he will see a urologist this month     Parkinson's Missed medication for 2 days due to not feeling well , PTA,  resumed Tremors  resolved    Generalized pain, Received Dilaudid initially by request which has helped  Chronic hip pain and back pain, prn analgesics and topical lidocaine patch    h/o afib, appear to be postop aifb with no recurrence, was taken off anticoagulation by cardiologist Currently BP low normal, hold Cardizem, continue propranolol at lower dose with holding parameters Keep on telemetry, has been sinus rhythm   FTT, reports a lot weaker than normal, he uses a cane at baseline due to parkinson's , he thinks he will need a walker due to weakness, PT  Reports has a walker at home   H/o OSA, appear improved after weight loss, reports has not needed cpap since weight loss  I have Reviewed nursing notes, Vitals, pain scores, I/o's, Lab results and  imaging results since pt's last encounter, details please see discussion above  I ordered the following labs:  Unresulted Labs (From admission, onward)     Start     Ordered         07/02/22  [id number]  Basic metabolic panel  Tomorrow morning,   R       Question:  Specimen collection method  Answer:  Lab=Lab collect   07/01/22 0906                               DVT prophylaxis: enoxaparin (LOVENOX) injection 40 mg Start: 06/30/22 2200   Code Status:   Code Status: Full Code  Family Communication: wife at bedside on 3/14, daughter at bedside on 3/16 Disposition:   Dispo: The patient is from: home              Anticipated d/c is to: home              Anticipated d/c date is: likely discharge on 3/17 if cleared by nephrology    Antimicrobials:    Anti-infectives (From admission, onward)    Start     Dose/Rate Route Frequency Ordered Stop   06/30/22 0532  metroNIDAZOLE (FLAGYL) IVPB 500 mg        500 mg 100 mL/hr over 60 Minutes Intravenous Every 12 hours 06/29/22 2011     06/30/22 0000  cefTRIAXone (ROCEPHIN) 2 g in sodium chloride 0.9 % 100 mL IVPB        2 g 200 mL/hr over 30 Minutes Intravenous Every 24 hours 06/29/22 2011     06/29/22 1630  ciprofloxacin (CIPRO) IVPB 400 mg        400 mg 200 mL/hr over 60 Minutes Intravenous  Once 06/29/22 1627 06/29/22 1733   06/29/22 1630  metroNIDAZOLE (FLAGYL) IVPB 500 mg  Status:  Discontinued        500 mg 100 mL/hr over 60 Minutes Intravenous Every 8 hours 06/29/22 1627 06/29/22 2011          Objective: Vitals:   07/01/22 0007 07/01/22 0305 07/01/22 0700 07/01/22 1148  BP: 127/81 105/69 136/81 118/82  Pulse: 71 66 69 84  Resp: 16 10 18 18   Temp: 98.1 F (36.7 C) 97.8 F (36.6 C) 98.2 F (36.8 C) 97.7 F (36.5 C)  TempSrc: Oral Oral Oral Oral  SpO2: 93% 94%    Weight:      Height:        Intake/Output Summary (Last 24 hours) at 07/01/2022 1426 Last data filed at 07/01/2022 0444 Gross per 24 hour  Intake 400 ml  Output 1125 ml  Net -725 ml   Filed Weights   06/29/22 1235  Weight: 95.3 kg    Examination:  General exam: alert, awake, communicative,calm, NAD, less tremors Respiratory system: Clear to auscultation. Respiratory effort normal. Cardiovascular system:  RRR.  Gastrointestinal system: Abdomen is nondistended, soft and nontender.  Normal bowel sounds heard. Central nervous system: Alert and oriented. No focal neurological deficits. Extremities:  no edema Skin: No rashes, lesions or ulcers Psychiatry: Judgement and insight appear normal. Mood & affect appropriate.     Data Reviewed: I have personally reviewed  labs and visualized  imaging studies since the last encounter and formulate the plan        Scheduled Meds:   Carbidopa-Levodopa ER  1 capsule Oral TID AC   enoxaparin (LOVENOX) injection  40 mg Subcutaneous Q24H   lidocaine  1 patch Transdermal Q24H   lidocaine  1 patch Transdermal Q24H   melatonin  5 mg Oral QHS   pantoprazole  20 mg Oral Daily   polyethylene glycol  17 g Oral Daily  pramipexole  0.5 mg Oral TID   propranolol  10 mg Oral BID   senna-docusate  1 tablet Oral BID   Continuous Infusions:  cefTRIAXone (ROCEPHIN)  IV 2 g (07/01/22 0054)   famotidine (PEPCID) IV 20 mg (06/30/22 2137)   metronidazole 500 mg (07/01/22 0525)     LOS: 2 days   Time spent:  25mins  Florencia Reasons, MD PhD FACP [facility]  Available via Epic secure chat 7am-7pm for nonurgent issues Please page for urgent issues To page the attending provider between 7A-7P or the covering provider during after hours 7P-7A, please log into the web site [email or web address] and access using universal  password for that web site. If you do not have the password, please call the hospital operator.    07/01/2022, 2:26 PM

## 2022-07-01 NOTE — Plan of Care (Signed)

## 2022-07-01 NOTE — Progress Notes (Signed)
Pt refused CPAP QHS pt on room air and stable. No active distress noted. Will continue to monitor

## 2022-07-01 NOTE — Evaluation (Signed)
Physical Therapy Evaluation Patient Details Name: [name] MRN: [id number] DOB: 05-20-1945 Today's Date: 07/01/2022  History of Present Illness  Pt is a 77 y.o. male who presented 06/29/22 with abdominal pain, nausea, diarrhea, and fever. Pt admitted with hyponatremia, AKI. PMH: Parkinson's, chronic RBBB, post op AF in Dec 2020, was taken off  Xarelto due to no recurrence , OSA on CPAP, HTN, vertigo   Clinical Impression  Pt presents with condition above and deficits mentioned below, see PT Problem List. PTA, he was independent, often not using an AD but has intermittently been holding onto the walls/furniture in the home and using a walking stick outside, recently transitioning to using a RW as he began to not feel well though. Pt lives with his wife in a house with a basement and x2 STE. Currently, pt demonstrates deficits in bil lower leg and feet sensation (worse distally), coordination, balance, power, and activity tolerance. He was able to perform all functional mobility without LOB at a min guard assist level when using a RW, but needed up to minA to prevent LOB when trying ambulate without UE support. Recommending follow-up with OPPT to address his deficits mentioned above along with his report of chronic back pain. Will continue to follow acutely.     Recommendations for follow up therapy are one component of a multi-disciplinary discharge planning process, led by the attending physician.  Recommendations may be updated based on patient status, additional functional criteria and insurance authorization.  Follow Up Recommendations Outpatient PT (for balance training and back pain)      Assistance Recommended at Discharge PRN  Patient can return home with the following  A little help with bathing/dressing/bathroom;Assistance with cooking/housework;Assist for transportation;Help with stairs or ramp for entrance    Equipment Recommendations None recommended by PT  Recommendations  for Other Services       Functional Status Assessment Patient has had a recent decline in their functional status and demonstrates the ability to make significant improvements in function in a reasonable and predictable amount of time.     Precautions / Restrictions Precautions Precautions: Fall Restrictions Weight Bearing Restrictions: No      Mobility  Bed Mobility Overal bed mobility: Needs Assistance Bed Mobility: Supine to Sit     Supine to sit: Min guard, HOB elevated     General bed mobility comments: Min guard assist for safety, extra time and pt using rails to sit up R EOB    Transfers Overall transfer level: Needs assistance Equipment used: Rolling walker (2 wheels) Transfers: Sit to/from Stand Sit to Stand: Min guard           General transfer comment: Min guard assist for safety, no LOB    Ambulation/Gait Ambulation/Gait assistance: Min guard, Min assist Gait Distance (Feet): 175 Feet Assistive device: Rolling walker (2 wheels), None Gait Pattern/deviations: Step-through pattern, Decreased stride length, Narrow base of support Gait velocity: reduced Gait velocity interpretation: <1.8 ft/sec, indicate of risk for recurrent falls   General Gait Details: Pt with fairly steady but slow gait with small, narrow steps when using the RW, min guard for safety with no LOB. When attempting to ambulate without UE support, he tends to drift laterally and need up to minA to recover his balance with LOB.  Stairs            Wheelchair Mobility    Modified Rankin (Stroke Patients Only)       Balance Overall balance assessment: Needs assistance Sitting-balance support: No  upper extremity supported, Feet supported Sitting balance-Leahy Scale: Good     Standing balance support: Bilateral upper extremity supported, During functional activity, No upper extremity supported Standing balance-Leahy Scale: Fair Standing balance comment: Able to take steps  without UE support but needs up to minA. Min guard when ambulating with RW                             Pertinent Vitals/Pain Pain Assessment Pain Assessment: Faces Faces Pain Scale: Hurts a little bit Pain Location: back Pain Descriptors / Indicators: Discomfort Pain Intervention(s): Limited activity within patient's tolerance, Monitored during session    [place] expects to be discharged to:: Private residence Living Arrangements: Spouse/significant other Available Help at Discharge: Family;Available 24 hours/day Type of Home: House Home Access: Stairs to enter Entrance Stairs-Rails: None (bars on door to hold onto) Entrance Stairs-Number of Steps: 2   Home Layout: Two level;Able to live on main level with bedroom/bathroom (basement for recreational use, does not need to access) Home Equipment: Rollator (4 wheels);Grab bars - tub/shower;Adaptive equipment;Rolling Walker (2 wheels);Shower seat - built in;Other (comment) (urinal)      Prior Function Prior Level of Function : Independent/Modified Independent;Driving             Mobility Comments: Typically not using an AD, but intermittently holds onto furniture/walls in the home and uses a walking stick outside. Recently has been using a RW due to feeling unwell. ADLs Comments: Wife assists with managing shoes/socks but pt does have AD. Likes to bike     Hand Dominance   Dominant Hand: Right    Extremity/Trunk Assessment   Upper Extremity Assessment Upper Extremity Assessment: RUE deficits/detail RUE Deficits / Details: tremor noted, chronic    Lower Extremity Assessment Lower Extremity Assessment: RLE deficits/detail;LLE deficits/detail RLE Deficits / Details: MMT scores of 4+ to 5 grossly bil; when back pain worsens his bil legs tends to "ache" with pain posteriorly, sometimes down to the ankles (L>R); numbness/tingling begings at lower legs and worsens distally RLE Sensation: decreased  light touch RLE Coordination: decreased gross motor LLE Deficits / Details: MMT scores of 4+ to 5 grossly bil; when back pain worsens his bil legs tends to "ache" with pain posteriorly, sometimes down to the ankles (L>R); numbness/tingling begings at lower legs and worsens distally LLE Sensation: decreased light touch LLE Coordination: decreased gross motor       Communication   Communication: No difficulties  Cognition Arousal/Alertness: Awake/alert Behavior During Therapy: WFL for tasks assessed/performed Overall Cognitive Status: Within Functional Limits for tasks assessed                                          General Comments General comments (skin integrity, edema, etc.): noted central lumbar back tender to palpation, pain did not worsen or improve with trunk flexion or extension    Exercises     Assessment/Plan    PT Assessment Patient needs continued PT services  PT Problem List Decreased activity tolerance;Decreased balance;Decreased mobility;Decreased coordination;Impaired sensation       PT Treatment Interventions DME instruction;Gait training;Stair training;Functional mobility training;Therapeutic activities;Therapeutic exercise;Balance training;Neuromuscular re-education;Patient/family education    PT Goals (Current goals can be found in the Care Plan section)  Acute Rehab PT Goals Patient Stated Goal: to improve PT Goal Formulation: With patient Time For Goal Achievement: 07/15/22  Potential to Achieve Goals: Good    Frequency Min 3X/week     Co-evaluation               AM-PAC PT "6 Clicks" Mobility  Outcome Measure Help needed turning from your back to your side while in a flat bed without using bedrails?: A Little Help needed moving from lying on your back to sitting on the side of a flat bed without using bedrails?: A Little Help needed moving to and from a bed to a chair (including a wheelchair)?: A Little Help needed standing  up from a chair using your arms (e.g., wheelchair or bedside chair)?: A Little Help needed to walk in hospital room?: A Little Help needed climbing 3-5 steps with a railing? : A Little 6 Click Score: 18    End of Session Equipment Utilized During Treatment: Gait belt Activity Tolerance: Patient tolerated treatment well Patient left: in chair;with call bell/phone within reach;with chair alarm set Nurse Communication: Mobility status (NT) PT Visit Diagnosis: Unsteadiness on feet (R26.81);Other abnormalities of gait and mobility (R26.89);Difficulty in walking, not elsewhere classified (R26.2)    Time: [id number] PT Time Calculation (min) (ACUTE ONLY): 34 min   Charges:   PT Evaluation $PT Eval Moderate Complexity: 1 Mod PT Treatments $Therapeutic Activity: 8-22 mins        Moishe Spice, PT, DPT Acute Rehabilitation Services  Office: [phone]   Orvan Falconer 07/01/2022, 11:56 AM

## 2022-07-01 NOTE — Progress Notes (Signed)
Patient ID: [name], male   DOB: Oct 09, 1945, 77 y.o.   MRN: [id number] S:  Clearly improved but still having irritative urinary symptoms and has urology follow-up Sodium 131 this morning Abdominal symptoms are better Daughter in room, wife has gastroenteritis  O:BP 118/82 (BP Location: Left Arm)   Pulse 84   Temp 97.7 F (36.5 C) (Oral)   Resp 18   Ht 6\' 3"  (1.905 m)   Wt 95.3 kg   SpO2 94%   BMI 26.26 kg/m   Intake/Output Summary (Last 24 hours) at 07/01/2022 1150 Last data filed at 07/01/2022 0444 Gross per 24 hour  Intake 400 ml  Output 1400 ml  Net -1000 ml    Intake/Output: I/O last 3 completed shifts: In: 650 [IV Piggyback:650] Out: 1560 [Urine:1560]  Intake/Output this shift:  No intake/output data recorded. Weight change:  Gen:NAD CVS: RRR Resp: CTA Abd: _+BS, soft, NT/ND Ext: no edema  Recent Labs  Lab 06/29/22 1336 06/29/22 2030 06/30/22 0806 06/30/22 1052 06/30/22 1243 06/30/22 1457 06/30/22 1704 06/30/22 2330 07/01/22 0226 07/01/22 0640 07/01/22 1038  NA 121*   < > 123*   < > 124* 124* 127* 129* 129* 130* 131*  K 3.9  --  4.1  --   --   --   --   --  4.1  --   --   CL 91*  --  95*  --   --   --   --   --  100  --   --   CO2 20*  --  21*  --   --   --   --   --  22  --   --   GLUCOSE 112*  --  89  --   --   --   --   --  83  --   --   BUN 11  --  10  --   --   --   --   --  9  --   --   CREATININE 1.38*  --  1.47*  --   --   --   --   --  1.21  --   --   ALBUMIN 3.6  --   --   --   --   --   --   --   --   --   --   CALCIUM 8.5*  --  7.9*  --   --   --   --   --  8.1*  --   --   PHOS  --   --   --   --   --   --   --   --  3.5  --   --   AST 20  --   --   --   --   --   --   --   --   --   --   ALT 15  --   --   --   --   --   --   --   --   --   --    < > = values in this interval not displayed.    Liver Function Tests: Recent Labs  Lab 06/29/22 1336  AST 20  ALT 15  ALKPHOS 64  BILITOT 1.0  PROT 6.7  ALBUMIN 3.6     Recent Labs  Lab 06/29/22 1336  LIPASE 32    No results  for input(s): "AMMONIA" in the last 168 hours. CBC: Recent Labs  Lab 06/29/22 1336 06/30/22 0431 07/01/22 0226  WBC 8.3 6.7 6.0  NEUTROABS  --   --  3.5  HGB 14.0 11.9* 12.4*  HCT 38.1* 32.6* 33.8*  MCV 86.2 86.2 87.3  PLT 217 160 174    Cardiac Enzymes: No results for input(s): "CKTOTAL", "CKMB", "CKMBINDEX", "TROPONINI" in the last 168 hours. CBG: No results for input(s): "GLUCAP" in the last 168 hours.  Iron Studies: No results for input(s): "IRON", "TIBC", "TRANSFERRIN", "FERRITIN" in the last 72 hours. Studies/Results: CT ABDOMEN PELVIS W CONTRAST  Result Date: 06/29/2022 CLINICAL DATA:  Abdominal pain with weakness and vomiting. EXAM: CT ABDOMEN AND PELVIS WITH CONTRAST TECHNIQUE: Multidetector CT imaging of the abdomen and pelvis was performed using the standard protocol following bolus administration of intravenous contrast. RADIATION DOSE REDUCTION: This exam was performed according to the departmental dose-optimization program which includes automated exposure control, adjustment of the mA and/or kV according to patient size and/or use of iterative reconstruction technique. CONTRAST:  63mL OMNIPAQUE IOHEXOL 350 MG/ML SOLN COMPARISON:  05/15/2019 FINDINGS: Lower chest: Unremarkable. Hepatobiliary: No suspicious focal abnormality within the liver parenchyma. There is no evidence for gallstones, gallbladder wall thickening, or pericholecystic fluid. No intrahepatic or extrahepatic biliary dilation. Pancreas: No focal mass lesion. No dilatation of the main duct. No intraparenchymal cyst. No peripancreatic edema. Spleen: No splenomegaly. No focal mass lesion. Adrenals/Urinary Tract: No adrenal nodule or mass. Small cysts are noted in the kidneys bilaterally. No followup imaging is recommended. Central sinus cysts are evident bilaterally on delayed imaging. No evidence for hydroureter. The urinary bladder appears normal  for the degree of distention. Stomach/Bowel: Tiny hiatal hernia. Stomach is distended with fluid. Duodenum is normally positioned as is the ligament of Treitz. No small bowel wall thickening. No small bowel dilatation. The terminal ileum is normal. The appendix is not well visualized, but there is no edema or inflammation in the region of the cecum. Colon is diffusely distended with fluid and stool. Vascular/Lymphatic: There is moderate atherosclerotic calcification of the abdominal aorta without aneurysm. There is no gastrohepatic or hepatoduodenal ligament lymphadenopathy. No retroperitoneal or mesenteric lymphadenopathy. No pelvic sidewall lymphadenopathy. Reproductive: Prostate gland is mildly enlarged. Other: No intraperitoneal free fluid. Haziness with clustered lymph nodes in the central small bowel mesentery is similar to prior, likely reactive. Musculoskeletal: No worrisome lytic or sclerotic osseous abnormality. Lumbosacral fusion hardware evident bridging L2-3 and also L4-5-S1 L3-S1. IMPRESSION: 1. No acute findings in the abdomen or pelvis. Specifically, no findings to explain the patient's history of abdominal pain and vomiting. 2. Colon is diffusely distended with fluid and stool, nonspecific. No associated colonic wall thickening to suggest colitis. 3.  Aortic Atherosclerosis (ICD10-I70.0). Electronically Signed   By: Misty Stanley M.D.   On: 06/29/2022 15:12    Carbidopa-Levodopa ER  1 capsule Oral TID AC   enoxaparin (LOVENOX) injection  40 mg Subcutaneous Q24H   lidocaine  1 patch Transdermal Q24H   melatonin  5 mg Oral QHS   pantoprazole  20 mg Oral Daily   polyethylene glycol  17 g Oral Daily   pramipexole  0.5 mg Oral TID   propranolol  10 mg Oral BID   senna-docusate  1 tablet Oral BID    BMET    Component Value Date/Time   NA 131 (L) 07/01/2022 1038   NA 139 07/01/2020 1021   K 4.1 07/01/2022 0226   CL 100 07/01/2022 0226   CO2  22 07/01/2022 0226   GLUCOSE 83 07/01/2022  0226   BUN 9 07/01/2022 0226   BUN 12 07/01/2020 1021   CREATININE 1.21 07/01/2022 0226   CREATININE 1.06 01/20/2022 0858   CALCIUM 8.1 (L) 07/01/2022 0226   GFRNONAA >60 07/01/2022 0226   GFRNONAA 54 (L) 01/14/2019 0958   GFRAA >60 05/17/2019 1137   GFRAA 62 01/14/2019 0958   CBC    Component Value Date/Time   WBC 6.0 07/01/2022 0226   RBC 3.87 (L) 07/01/2022 0226   HGB 12.4 (L) 07/01/2022 0226   HCT 33.8 (L) 07/01/2022 0226   PLT 174 07/01/2022 0226   MCV 87.3 07/01/2022 0226   MCH 32.0 07/01/2022 0226   MCHC 36.7 (H) 07/01/2022 0226   RDW 11.9 07/01/2022 0226   LYMPHSABS 1.4 07/01/2022 0226   MONOABS 0.8 07/01/2022 0226   EOSABS 0.2 07/01/2022 0226   BASOSABS 0.0 07/01/2022 0226     Assessment/Plan:  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  Improving with fluid restriction. Fevers, N/V/dysuria, abdominal pain - urine culture negative 06/27/22.  Currently on ceftriaxone and flagyl per primary svc.  CT scan without evidence of colitis.  Clinically improving AKI -improved  No further recommendations at the current time and I will sign off.  Patient does not need an outpatient nephrology follow-up.  I anticipate his SIADH physiology will improve as his overall status improves.  Can continue fluid restriction as needed to keep serum sodium above 130.  Rexene Agent, MD  [facility]

## 2022-07-02 DIAGNOSIS — R300 Dysuria: Secondary | ICD-10-CM | POA: Insufficient documentation

## 2022-07-02 DIAGNOSIS — R079 Chest pain, unspecified: Secondary | ICD-10-CM

## 2022-07-02 DIAGNOSIS — E871 Hypo-osmolality and hyponatremia: Secondary | ICD-10-CM | POA: Diagnosis not present

## 2022-07-02 LAB — BASIC METABOLIC PANEL
Anion gap: 9 (ref 5–15)
BUN: 9 mg/dL (ref 8–23)
CO2: 23 mmol/L (ref 22–32)
Calcium: 8.4 mg/dL — ABNORMAL LOW (ref 8.9–10.3)
Chloride: 97 mmol/L — ABNORMAL LOW (ref 98–111)
Creatinine, Ser: 1.13 mg/dL (ref 0.61–1.24)
GFR, Estimated: >60 mL/min (ref >60)
Glucose, Bld: 93 mg/dL (ref 70–99)
Potassium: 4 mmol/L (ref 3.5–5.1)
Sodium: 129 mmol/L — ABNORMAL LOW (ref 135–145)

## 2022-07-02 LAB — TROPONIN I (HIGH SENSITIVITY)
Troponin I (High Sensitivity): 5 ng/L (ref <18)
Troponin I (High Sensitivity): 3 ng/L (ref <18)

## 2022-07-02 NOTE — Assessment & Plan Note (Signed)
Dysuria, with ucx neg but clearly LUTS with lightheadedness/fever and lack of improvement.  He may end up needing IV fluids and abx, in spite of neg ucx.  I called [facility] ER triage about pending arrival.  He agreed with ER evaluation family can transport him immediately.  See ER/inpatient notes.

## 2022-07-02 NOTE — Discharge Summary (Signed)
Discharge Summary  [name] [id number] DOB: [birth year]  PCP: Tonia Ghent, MD  Admit date: 06/29/2022 Discharge date: 07/02/2022       Time spent:  60mins  Recommendations for Outpatient Follow-up:  F/u with PCP within a week  for hospital discharge follow up, repeat cbc/bmp at follow up    Discharge Diagnoses:  Active Hospital Problems   Diagnosis Date Noted   Hyponatremia 05/15/2019   Chest pain 07/02/2022   Parkinson's disease 05/04/2019   OSA on CPAP    RBBB 06/27/2011    Resolved Hospital Problems  No resolved problems to display.    Discharge Condition: stable  Diet recommendation: regular diet   Filed Weights   06/29/22 1235  Weight: 95.3 kg    History of present illness:  Chief Complaint: ab pain, nausea    HPI: [name] is a 77 y.o. male with medical history significant of Parkinson's, chronic RBBB, post op AF in Dec 2020, was taken off  Xarelto due to no recurrence , OSA on CPAP, presented to ED due to not feeling well for a week, had abdominal pain and nausea not able to take Parkinson's medication for the last 2 days, has worsening tremors, diffuse ab pain, has diarrheax1 at home, reports fever 102 at home, he was seen at urgent care on 3/12 was diagnosed with UTI was given Bactrim, however he did not improve,he presents to the ED   ED Course:    Data Independently reviewed: Blood pressure (!) 147/94, pulse (!) 105, temperature 99.9 F (37.7 C), temperature source Oral, resp. rate (!) 23, height 6\' 3"  (1.905 m), weight 95.3 kg, SpO2 98 %.   -Respiratory viral panel negative for RSV/flu/COVID-19 -CT ab/pel with contrast: Colon is diffusely distended with fluid and stool, nonspecific. No associated colonic wall thickening to suggest colitis. -EKG: .  Sinus rhythm ,chronic RBBB, no acute ST-T changes, Qtc 449 -Blood culture in process -UA pending in the ED if large leukocyte, fewer bacteria, negative nitrite -Lactic acid   1 -WBC 8.3 -cr 1.38 (baseline 1.93-month ago) -Na 121   Given cipro/flagyl, ns 1liter, hospitalist call to admit  Hospital Course:  Principal Problem:   Hyponatremia Active Problems:   RBBB   OSA on CPAP   Parkinson's disease   Chest pain   Assessment and Plan:  Hyponatremia, appear acute -Wife report he had history of hyponatremia several years ago -Sodium on presentation 121 ,no confusion, initially received ivf -Unremarkable TSH and a.m. 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 ,  -slowly improving, appetite has improved, nephrology signed off -He desires to go home, recommend follow-up with PCP, repeat BMP next week    AKI; CT ab/pel no obstructive nephropathy Likely from dehydration from gi loss Eating more, Cr improved/normalized, renal dosing meds Repeat BMP next week     Ab pain/ distention /nausea/Also reports diarrheax1 at home, likely viral in etiology CT did not show colitis but Colon is diffusely distended with fluid and stool,  Reports not bm since Tuesday  blood cultures no growth Received Cipro and Flagyl in the ED, continue Flagyl, change Cipro to Rocephin, continue antibiotics since clinically improved with the treatment Symptoms resolved, report wife has viral gastroenteritis     GERD C/o heart burn, think from throwing up Ppi, Tums and pepcid ordered  Follow-up with PCP   UTI? Does has urinary symptoms Urine culture from urgent care no growth, ua collected on admission does not appear reflexed  Already  on antibiotics Dysuria has  resolved , continue abx to finished total of 5 days treatment, suspect underline BPH for which he will see a urologist this month     Parkinson's Missed medication for 2 days due to not feeling well , PTA,  resumed Tremors  resolved    Generalized pain, Received Dilaudid initially by request which has helped   Chronic hip pain and back pain, think  topical lidocaine patch helps   h/o  afib, appear to be postop aifb with no recurrence, was taken off anticoagulation by cardiologist Cardizem held in the hospital and on lower dose propranolol in the hospital due to low normal BP, blood pressure has improved, resume home meds at discharge   has been sinus rhythm in the hospital     FTT, improved, has cane and walker at home.  Seen by PT who recommended outpatient PT, patient think he has improved does not think outpatient PT will add any benefit, follow-up with PCP      H/o OSA, appear improved after weight loss, reports has not needed cpap since weight loss    Discharge Exam: BP 138/84 (BP Location: Left Arm)   Pulse 78   Temp 98.3 F (36.8 C) (Oral)   Resp 18   Ht 6\' 3"  (1.905 m)   Wt 95.3 kg   SpO2 95%   BMI 26.26 kg/m   General: NAD, pleasant Cardiovascular: RRR Respiratory: Normal respiratory effort    Discharge Instructions     Diet general   Complete by: As directed    Increase activity slowly   Complete by: As directed       Allergies as of 07/02/2022       Reactions   Flexeril [cyclobenzaprine] Other (See Comments)   Constipation   Hctz [hydrochlorothiazide] Other (See Comments)   Hyponatremia    Morphine And Related Hives, Other (See Comments)   Hallucinations Can tolerate oxycodone   Phenergan [promethazine] Other (See Comments)   Told to avoid due to Parkinson's   Sinemet [carbidopa-levodopa] Other (See Comments)   Unknown reaction   Tambocor [flecainide] Nausea And Vomiting        Medication List     STOP taking these medications    sulfamethoxazole-trimethoprim 800-160 MG tablet Commonly known as: BACTRIM DS   tiZANidine 4 MG tablet Commonly known as: ZANAFLEX       TAKE these medications    acetaminophen 500 MG tablet Commonly known as: TYLENOL Take 1,000 mg by mouth daily as needed for moderate pain.   diclofenac Sodium 1 % Gel Commonly known as: VOLTAREN Apply 1 Application topically 4 (four) times daily  as needed (pain.).   diltiazem 180 MG 24 hr capsule Commonly known as: CARDIZEM CD Take 1 capsule (180 mg total) by mouth daily. What changed: when to take this   docusate sodium 100 MG capsule Commonly known as: COLACE Take 1 capsule (100 mg total) by mouth 2 (two) times daily.   fluticasone 50 MCG/ACT nasal spray Commonly known as: FLONASE Place 2 sprays into both nostrils daily. What changed: when to take this   Multivitamin Men 50+ Tabs Take 1 tablet by mouth daily.   omeprazole 20 MG tablet Commonly known as: PRILOSEC OTC Take 20 mg by mouth daily as needed (heartburn).   potassium chloride 10 MEQ tablet Commonly known as: KLOR-CON TAKE 1 TABLET BY MOUTH TWICE A DAY   pramipexole 0.5 MG tablet Commonly known as: MIRAPEX TAKE 1 TABLET(0.5 MG) BY MOUTH THREE TIMES  DAILY What changed: See the new instructions.   propranolol 20 MG tablet Commonly known as: INDERAL Take 1 tablet (20 mg total) by mouth 2 (two) times daily.   Rytary 48.75-195 MG Cpcr Generic drug: Carbidopa-Levodopa ER Take 1 capsule by mouth See admin instructions. 1 capsule 3 times daily at 0800, 1300, 1800       Allergies  Allergen Reactions   Flexeril [Cyclobenzaprine] Other (See Comments)    Constipation   Hctz [Hydrochlorothiazide] Other (See Comments)    Hyponatremia    Morphine And Related Hives and Other (See Comments)    Hallucinations Can tolerate oxycodone   Phenergan [Promethazine] Other (See Comments)    Told to avoid due to Parkinson's   Sinemet [Carbidopa-Levodopa] Other (See Comments)    Unknown reaction   Tambocor [Flecainide] Nausea And Vomiting    Follow-up Information     Tonia Ghent, MD Follow up in 1 week(s).   Specialty: Family Medicine Why: hospital discharge follow up, repeat basic lab works including cbc/bmp. pcp to referr you to outpatient physical therapy Contact information: [place] [address] [phone]                   The results of significant diagnostics from this hospitalization (including imaging, microbiology, ancillary and laboratory) are listed below for reference.    Significant Diagnostic Studies: CT ABDOMEN PELVIS W CONTRAST  Result Date: 06/29/2022 CLINICAL DATA:  Abdominal pain with weakness and vomiting. EXAM: CT ABDOMEN AND PELVIS WITH CONTRAST TECHNIQUE: Multidetector CT imaging of the abdomen and pelvis was performed using the standard protocol following bolus administration of intravenous contrast. RADIATION DOSE REDUCTION: This exam was performed according to the departmental dose-optimization program which includes automated exposure control, adjustment of the mA and/or kV according to patient size and/or use of iterative reconstruction technique. CONTRAST:  56mL OMNIPAQUE IOHEXOL 350 MG/ML SOLN COMPARISON:  05/15/2019 FINDINGS: Lower chest: Unremarkable. Hepatobiliary: No suspicious focal abnormality within the liver parenchyma. There is no evidence for gallstones, gallbladder wall thickening, or pericholecystic fluid. No intrahepatic or extrahepatic biliary dilation. Pancreas: No focal mass lesion. No dilatation of the main duct. No intraparenchymal cyst. No peripancreatic edema. Spleen: No splenomegaly. No focal mass lesion. Adrenals/Urinary Tract: No adrenal nodule or mass. Small cysts are noted in the kidneys bilaterally. No followup imaging is recommended. Central sinus cysts are evident bilaterally on delayed imaging. No evidence for hydroureter. The urinary bladder appears normal for the degree of distention. Stomach/Bowel: Tiny hiatal hernia. Stomach is distended with fluid. Duodenum is normally positioned as is the ligament of Treitz. No small bowel wall thickening. No small bowel dilatation. The terminal ileum is normal. The appendix is not well visualized, but there is no edema or inflammation in the region of the cecum. Colon is diffusely distended with fluid and stool.  Vascular/Lymphatic: There is moderate atherosclerotic calcification of the abdominal aorta without aneurysm. There is no gastrohepatic or hepatoduodenal ligament lymphadenopathy. No retroperitoneal or mesenteric lymphadenopathy. No pelvic sidewall lymphadenopathy. Reproductive: Prostate gland is mildly enlarged. Other: No intraperitoneal free fluid. Haziness with clustered lymph nodes in the central small bowel mesentery is similar to prior, likely reactive. Musculoskeletal: No worrisome lytic or sclerotic osseous abnormality. Lumbosacral fusion hardware evident bridging L2-3 and also L4-5-S1 L3-S1. IMPRESSION: 1. No acute findings in the abdomen or pelvis. Specifically, no findings to explain the patient's history of abdominal pain and vomiting. 2. Colon is diffusely distended with fluid and stool, nonspecific. No associated colonic wall thickening  to suggest colitis. 3.  Aortic Atherosclerosis (ICD10-I70.0). Electronically Signed   By: Misty [place] M.D.   On: 06/29/2022 15:12    Microbiology: Recent Results (from the past 240 hour(s))  Urine Culture     Status: None   Collection Time: 06/27/22  5:17 PM   Specimen: Urine, Clean Catch  Result Value Ref Range Status   Specimen Description URINE, CLEAN CATCH  Final   Special Requests NONE  Final   Culture   Final    NO GROWTH Performed at [facility], [place] [address], [place], [place] [address]    Report Status 06/28/2022 FINAL  Final  Resp panel by RT-PCR (RSV, Flu A&B, Covid) Anterior Nasal Swab     Status: None   Collection Time: 06/29/22 12:39 PM   Specimen: Anterior Nasal Swab  Result Value Ref Range Status   SARS Coronavirus 2 by RT PCR NEGATIVE NEGATIVE Final   Influenza A by PCR NEGATIVE NEGATIVE Final   Influenza B by PCR NEGATIVE NEGATIVE Final    Comment: (NOTE) The Xpert Xpress SARS-CoV-2/FLU/RSV plus assay is intended as an aid in the diagnosis of influenza from Nasopharyngeal swab specimens and should not be used as a sole  basis for treatment. Nasal washings and aspirates are unacceptable for Xpert Xpress SARS-CoV-2/FLU/RSV testing.  Fact Sheet for Patients: EntrepreneurPulse.com.au  Fact Sheet for Healthcare Providers: IncredibleEmployment.be  This test is not yet approved or cleared by the [place] FDA and has been authorized for detection and/or diagnosis of SARS-CoV-2 by FDA under an Emergency Use Authorization (EUA). This EUA will remain in effect (meaning this test can be used) for the duration of the COVID-19 declaration under Section 564(b)(1) of the Act, 21 U.S.C. section 360bbb-3(b)(1), unless the authorization is terminated or revoked.     Resp Syncytial Virus by PCR NEGATIVE NEGATIVE Final    Comment: (NOTE) Fact Sheet for Patients: EntrepreneurPulse.com.au  Fact Sheet for Healthcare Providers: IncredibleEmployment.be  This test is not yet approved or cleared by the [place] FDA and has been authorized for detection and/or diagnosis of SARS-CoV-2 by FDA under an Emergency Use Authorization (EUA). This EUA will remain in effect (meaning this test can be used) for the duration of the COVID-19 declaration under Section 564(b)(1) of the Act, 21 U.S.C. section 360bbb-3(b)(1), unless the authorization is terminated or revoked.  Performed at [facility], [place] [address], [place], [place] [address]   Blood culture (routine x 2)     Status: None (Preliminary result)   Collection Time: 06/29/22 12:43 PM   Specimen: BLOOD LEFT FOREARM  Result Value Ref Range Status   Specimen Description BLOOD LEFT FOREARM  Final   Special Requests   Final    BOTTLES DRAWN AEROBIC AND ANAEROBIC Blood Culture results may not be optimal due to an inadequate volume of blood received in culture bottles   Culture   Final    NO GROWTH 3 DAYS Performed at [facility], [place] [address], [place], [place] [address]     Report Status PENDING  Incomplete  Blood culture (routine x 2)     Status: None (Preliminary result)   Collection Time: 06/29/22 12:56 PM   Specimen: BLOOD RIGHT HAND  Result Value Ref Range Status   Specimen Description BLOOD RIGHT HAND  Final   Special Requests   Final    BOTTLES DRAWN AEROBIC AND ANAEROBIC Blood Culture results may not be optimal due to an inadequate volume of blood received  in culture bottles   Culture   Final    NO GROWTH 3 DAYS Performed at [facility], [place] [address], [place], [place] [address]    Report Status PENDING  Incomplete     Labs: Basic Metabolic Panel: Recent Labs  Lab 06/29/22 1336 06/29/22 2030 06/30/22 0431 06/30/22 0806 06/30/22 1052 06/30/22 2330 07/01/22 0226 07/01/22 0640 07/01/22 1038 07/02/22 0448  NA 121*   < > 121* 123*   < > 129* 129* 130* 131* 129*  K 3.9  --   --  4.1  --   --  4.1  --   --  4.0  CL 91*  --   --  95*  --   --  100  --   --  97*  CO2 20*  --   --  21*  --   --  22  --   --  23  GLUCOSE 112*  --   --  89  --   --  83  --   --  93  BUN 11  --   --  10  --   --  9  --   --  9  CREATININE 1.38*  --   --  1.47*  --   --  1.21  --   --  1.13  CALCIUM 8.5*  --   --  7.9*  --   --  8.1*  --   --  8.4*  MG  --   --  1.5*  --   --   --  2.0  --   --   --   PHOS  --   --   --   --   --   --  3.5  --   --   --    < > = values in this interval not displayed.   Liver Function Tests: Recent Labs  Lab 06/29/22 1336  AST 20  ALT 15  ALKPHOS 64  BILITOT 1.0  PROT 6.7  ALBUMIN 3.6   Recent Labs  Lab 06/29/22 1336  LIPASE 32   No results for input(s): "AMMONIA" in the last 168 hours. CBC: Recent Labs  Lab 06/29/22 1336 06/30/22 0431 07/01/22 0226  WBC 8.3 6.7 6.0  NEUTROABS  --   --  3.5  HGB 14.0 11.9* 12.4*  HCT 38.1* 32.6* 33.8*  MCV 86.2 86.2 87.3  PLT 217 160 174   Cardiac Enzymes: No results for input(s): "CKTOTAL", "CKMB", "CKMBINDEX", "TROPONINI" in the last 168 hours. BNP: BNP (last  3 results) No results for input(s): "BNP" in the last 8760 hours.  ProBNP (last 3 results) Recent Labs    07/15/21 1230  PROBNP 27.0    CBG: No results for input(s): "GLUCAP" in the last 168 hours.  FURTHER DISCHARGE INSTRUCTIONS:   Get Medicines reviewed and adjusted: Please take all your medications with you for your next visit with your Primary MD   Laboratory/radiological data: Please request your Primary MD to go over all hospital tests and procedure/radiological results at the follow up, please ask your Primary MD to get all Hospital records sent to his/her office.   In some cases, they will be blood work, cultures and biopsy results pending at the time of your discharge. Please request that your primary care M.D. goes through all the records of your hospital data and follows up on these results.   Also Note the following: If you experience worsening of your  admission symptoms, develop shortness of breath, life threatening emergency, suicidal or homicidal thoughts you must seek medical attention immediately by calling 911 or calling your MD immediately  if symptoms less severe.   You must read complete instructions/literature along with all the possible adverse reactions/side effects for all the Medicines you take and that have been prescribed to you. Take any new Medicines after you have completely understood and accpet all the possible adverse reactions/side effects.    Do not drive when taking Pain medications or sleeping medications (Benzodaizepines)   Do not take more than prescribed Pain, Sleep and Anxiety Medications. It is not advisable to combine anxiety,sleep and pain medications without talking with your primary care practitioner   Special Instructions: If you have smoked or chewed Tobacco  in the last 2 yrs please stop smoking, stop any regular Alcohol  and or any Recreational drug use.   Wear Seat belts while driving.   Please note: You were cared for by a  hospitalist during your hospital stay. Once you are discharged, your primary care physician will handle any further medical issues. Please note that NO REFILLS for any discharge medications will be authorized once you are discharged, as it is imperative that you return to your primary care physician (or establish a relationship with a primary care physician if you do not have one) for your post hospital discharge needs so that they can reassess your need for medications and monitor your lab values.     Signed:  Florencia Reasons MD, PhD, FACP  [facility] 07/02/2022, 9:56 AM

## 2022-07-02 NOTE — TOC Transition Note (Signed)
Transition of Care [facility]) - CM/SW Discharge Note   Patient Details  Name: [name] MRN: [id number] Date of Birth: June 07, 1945  [facility]) CM/SW Contact:  Carles Collet, RN Phone Number: 07/02/2022, 10:48 AM   Clinical Narrative:     Referral [phone] made to [place] for PT.  Unable to reach patient on phone, Added to AVS        Patient Goals and CMS Choice      Discharge Placement                         Discharge Plan and Services Additional resources added to the After Visit Summary for                                       Social Determinants of Health (SDOH) Interventions SDOH Screenings   Food Insecurity: No Food Insecurity (09/06/2021)  Housing: Low Risk  (09/06/2021)  Transportation Needs: No Transportation Needs (09/06/2021)  Alcohol Screen: Low Risk  (09/06/2021)  Depression (PHQ2-9): Low Risk  (06/29/2022)  Financial Resource Strain: Low Risk  (09/06/2021)  Physical Activity: Inactive (09/06/2021)  Social Connections: Socially Integrated (09/06/2021)  Stress: No Stress Concern Present (09/06/2021)  Tobacco Use: Low Risk  (06/29/2022)     Readmission Risk Interventions     No data to display

## 2022-07-02 NOTE — Progress Notes (Addendum)
Critical care note:  Date of note: 07/02/2022  Subjective: The patient was seen for midsternal chest discomfort felt like uneasy feeling without associated nausea or vomiting or diaphoresis.  He denied any chest pain or palpitations.  No cough or wheezing.  Objective: Physical examination: Generally: Ill looking Caucasian male in no acute distress. Vital signs: BP 150/89 with heart rate of 63, temperature 98, respiratory rate 14 and pulse oximetry of 95% on room air. Head - atraumatic, normocephalic.  Pupils - equal, round and reactive to light and accommodation. Extraocular movements are intact. No scleral icterus.  Oropharynx - moist mucous membranes and tongue. No pharyngeal erythema or exudate.  Neck - supple. No JVD. Carotid pulses 2+ bilaterally. No carotid bruits. No palpable thyromegaly or lymphadenopathy. Cardiovascular - regular rate and rhythm. Normal S1 and S2. No murmurs, gallops or rubs.  Lungs - clear to auscultation bilaterally.  Abdomen - soft and nontender. Positive bowel sounds. No palpable organomegaly or masses.  Extremities - no pitting edema, clubbing or cyanosis.  Neuro - grossly non-focal. Skin - no rashes. GU and rectal - deferred.  Labs and notes were reviewed.  Assessment/plan: 1.  Chest pain, rule out acute coronary syndrome. - Stat EKG showed normal sinus rhythm with a rate of 63 with incomplete right bundle branch block and Q waves inferiorly. -We will follow serial troponins. - I suspect that the cardiology probably musculoskeletal or GI related as the pain extends to the epigastric area. - We will add as needed GI cocktail.  Authorized and performed by: Eugenie Norrie, MD Total critical care time: Approximately    30   minutes. Due to a high probability of clinically significant, life-threatening deterioration, the patient required my highest level of preparedness to intervene emergently and I personally spent this critical care time directly and personally  managing the patient.  This critical care time included obtaining a history, examining the patient, pulse oximetry, ordering and review of studies, arranging urgent treatment with development of management plan, evaluation of patient's response to treatment, frequent reassessment, and discussions with other providers. This critical care time was performed to assess and manage the high probability of imminent, life-threatening deterioration that could result in multiorgan failure.  It was exclusive of separately billable procedures and treating other patients and teaching time.

## 2022-07-03 ENCOUNTER — Telehealth: Payer: Self-pay

## 2022-07-03 NOTE — Transitions of Care (Post Inpatient/ED Visit) (Signed)
   07/03/2022  Name: [name] MRN: [id number] DOB: June 25, 1945  Today's TOC FU Call Status:    Transition Care Management Follow-up Telephone Call Date of Discharge: 07/02/22 Discharge Facility: [facility]) Type of Discharge: Inpatient Admission Primary Inpatient Discharge Diagnosis:: Hyponatremia How have you been since you were released from the hospital?: Better Any questions or concerns?: No- Patient is feeling well, he was able to vacuum and is not needing a walker.  Patient has eaten several meals today and had a small bowel movement.  Items Reviewed: Did you receive and understand the discharge instructions provided?: Yes Medications obtained and verified?: Yes (Medications Reviewed) Any new allergies since your discharge?: No Dietary orders reviewed?: Yes Type of Diet Ordered:: Regular Do you have support at home?: Yes People in Home: spouse Name of Support/Comfort Primary Source: [name]- wife  Home Care and Equipment/Supplies: [place] Ordered?: No Any new equipment or medical supplies ordered?: No  Functional Questionnaire: Do you need assistance with bathing/showering or dressing?: No Do you need assistance with meal preparation?: No Do you need assistance with eating?: No Do you have difficulty maintaining continence: No Do you need assistance with getting out of bed/getting out of a chair/moving?: No Do you have difficulty managing or taking your medications?: No  Follow up appointments reviewed: PCP Follow-up appointment confirmed?: No (Patient is going to call for appt.) MD Provider Line [phone] Given: Yes [facility] Follow-up appointment confirmed?: NA Do you need transportation to your follow-up appointment?: No Do you understand care options if your condition(s) worsen?: Yes-patient verbalized understanding  SDOH Interventions Today    Flowsheet Row Most Recent Value  SDOH Interventions   Housing  Interventions Intervention Not Indicated  Utilities Interventions Intervention Not Indicated      Johnney Killian, RN, BSN, CCM Care Management Coordinator [facility] Phone: [phone]: [phone]

## 2022-07-04 ENCOUNTER — Telehealth: Payer: Self-pay | Admitting: Family Medicine

## 2022-07-04 LAB — CULTURE, BLOOD (ROUTINE X 2)
Culture: NO GROWTH
Culture: NO GROWTH

## 2022-07-04 NOTE — Telephone Encounter (Signed)
Patient needs a 1 week hospital follow up with Dr. Damita Dunnings; labs will be done at visit

## 2022-07-04 NOTE — Telephone Encounter (Signed)
Called patient and scheduled HFU on 3/25.

## 2022-07-04 NOTE — Telephone Encounter (Signed)
Patient called in and stated that he was recently in the hospital. He stated that he needed some labs done but I don't see any orders for none. I did inform him he will probably need a hospital follow up. Please advise. Thank you!

## 2022-07-09 ENCOUNTER — Other Ambulatory Visit: Payer: Self-pay | Admitting: Cardiology

## 2022-07-09 DIAGNOSIS — I10 Essential (primary) hypertension: Secondary | ICD-10-CM

## 2022-07-10 ENCOUNTER — Encounter: Payer: Self-pay | Admitting: Family Medicine

## 2022-07-10 ENCOUNTER — Ambulatory Visit (INDEPENDENT_AMBULATORY_CARE_PROVIDER_SITE_OTHER): Payer: PPO | Admitting: Family Medicine

## 2022-07-10 ENCOUNTER — Other Ambulatory Visit: Payer: Self-pay | Admitting: Cardiology

## 2022-07-10 VITALS — BP 118/64 | HR 75 | Temp 97.3°F | Ht 75.0 in | Wt 204.0 lb

## 2022-07-10 DIAGNOSIS — I10 Essential (primary) hypertension: Secondary | ICD-10-CM

## 2022-07-10 DIAGNOSIS — E871 Hypo-osmolality and hyponatremia: Secondary | ICD-10-CM

## 2022-07-10 LAB — CBC WITH DIFFERENTIAL/PLATELET
Basophils Absolute: 0 10*3/uL (ref 0.0–0.1)
Basophils Relative: 0.6 % (ref 0.0–3.0)
Eosinophils Absolute: 0.1 10*3/uL (ref 0.0–0.7)
Eosinophils Relative: 1.8 % (ref 0.0–5.0)
HCT: 38.9 % — ABNORMAL LOW (ref 39.0–52.0)
Hemoglobin: 13.4 g/dL (ref 13.0–17.0)
Lymphocytes Relative: 22.6 % (ref 12.0–46.0)
Lymphs Abs: 1.6 10*3/uL (ref 0.7–4.0)
MCHC: 34.5 g/dL (ref 30.0–36.0)
MCV: 90.7 fl (ref 78.0–100.0)
Monocytes Absolute: 0.9 10*3/uL (ref 0.1–1.0)
Monocytes Relative: 13.2 % — ABNORMAL HIGH (ref 3.0–12.0)
Neutro Abs: 4.3 10*3/uL (ref 1.4–7.7)
Neutrophils Relative %: 61.8 % (ref 43.0–77.0)
Platelets: 342 10*3/uL (ref 150.0–400.0)
RBC: 4.29 Mil/uL (ref 4.22–5.81)
RDW: 12.8 % (ref 11.5–15.5)
WBC: 6.9 10*3/uL (ref 4.0–10.5)

## 2022-07-10 LAB — BASIC METABOLIC PANEL
BUN: 10 mg/dL (ref 6–23)
CO2: 27 mEq/L (ref 19–32)
Calcium: 9.2 mg/dL (ref 8.4–10.5)
Chloride: 94 mEq/L — ABNORMAL LOW (ref 96–112)
Creatinine, Ser: 1.19 mg/dL (ref 0.40–1.50)
GFR: 59.52 mL/min — ABNORMAL LOW (ref >60.00)
Glucose, Bld: 91 mg/dL (ref 70–99)
Potassium: 4.3 mEq/L (ref 3.5–5.1)
Sodium: 127 mEq/L — ABNORMAL LOW (ref 135–145)

## 2022-07-10 NOTE — Progress Notes (Signed)
Inpatient f/u, inpatient course d/w pt.     Hospital Course:  Principal Problem:   Hyponatremia Active Problems:   RBBB   OSA on CPAP   Parkinson's disease   Chest pain   Assessment and Plan:   Hyponatremia, appear acute -Wife report he had history of hyponatremia several years ago -Sodium on presentation 121 ,no confusion, initially received ivf -Unremarkable TSH and a.m. 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 ,  -slowly improving, appetite has improved, nephrology signed off -He desires to go home, recommend follow-up with PCP, repeat BMP next week   AKI; CT ab/pel no obstructive nephropathy Likely from dehydration from gi loss Eating more, Cr improved/normalized, renal dosing meds Repeat BMP next week   Ab pain/ distention /nausea/Also reports diarrheax1 at home, likely viral in etiology CT did not show colitis but Colon is diffusely distended with fluid and stool,  Reports not bm since Tuesday  blood cultures no growth Received Cipro and Flagyl in the ED, continue Flagyl, change Cipro to Rocephin, continue antibiotics since clinically improved with the treatment Symptoms resolved, report wife has viral gastroenteritis    GERD C/o heart burn, think from throwing up Ppi, Tums and pepcid ordered  Follow-up with PCP   UTI? Does has urinary symptoms Urine culture from urgent care no growth, ua collected on admission does not appear reflexed  Already on antibiotics Dysuria has  resolved , continue abx to finished total of 5 days treatment, suspect underline BPH for which he will see a urologist this month   Parkinson's Missed medication for 2 days due to not feeling well , PTA,  resumed Tremors  resolved    Generalized pain, Received Dilaudid initially by request which has helped   Chronic hip pain and back pain, think  topical lidocaine patch helps   h/o afib, appear to be postop aifb with no recurrence, was taken off anticoagulation by  cardiologist Cardizem held in the hospital and on lower dose propranolol in the hospital due to low normal BP, blood pressure has improved, resume home meds at discharge   has been sinus rhythm in the hospital   FTT, improved, has cane and walker at home.  Seen by PT who recommended outpatient PT, patient think he has improved does not think outpatient PT will add any benefit, follow-up with PCP   H/o OSA, appear improved after weight loss, reports has not needed cpap since weight loss =========================== Wife tested positive for norovirus.  Discussed.  Inpatient course discussed with patient.  He clearly feels better.  Still with urinary frequency w/o burning.  He has urology f/u pending.  No diarrhea, no vomiting.  No abd pain.  No fevers, no chills.  Still with some fatigue.  No CP.  His tremor improved with restarting his parkinsonian medication.  Follow-up labs are pending and his abdominal symptoms clearly improved in the meantime.  Hyponatremia inpatient labs discussed with patient.  Meds, vitals, and allergies reviewed.   ROS: Per HPI unless specifically indicated in ROS section   GEN: nad, alert and oriented HEENT: ncat NECK: supple w/o LA CV: rrr. PULM: ctab, no inc wob ABD: soft, +bs EXT: no edema SKIN: well perfused.

## 2022-07-10 NOTE — Patient Instructions (Signed)
Don't change your meds for now.  Go to the lab on the way out.   If you have mychart we'll likely use that to update you.   Take care.  Glad to see you. 

## 2022-07-12 DIAGNOSIS — N529 Male erectile dysfunction, unspecified: Secondary | ICD-10-CM | POA: Insufficient documentation

## 2022-07-12 NOTE — Assessment & Plan Note (Signed)
Likely related to acute illness.  He is back on his baseline medications in the meantime.  He clearly feels better.  Suspect that this was related to/exacerbated by viral gastroenteritis.  See notes on labs.  Abdominal symptoms have clearly improved in the meantime.  See notes on follow-up labs.

## 2022-07-13 ENCOUNTER — Other Ambulatory Visit: Payer: Self-pay | Admitting: Family Medicine

## 2022-07-13 DIAGNOSIS — G20A1 Parkinson's disease without dyskinesia, without mention of fluctuations: Secondary | ICD-10-CM | POA: Diagnosis not present

## 2022-07-13 DIAGNOSIS — R3915 Urgency of urination: Secondary | ICD-10-CM | POA: Diagnosis not present

## 2022-07-13 DIAGNOSIS — E871 Hypo-osmolality and hyponatremia: Secondary | ICD-10-CM

## 2022-07-13 DIAGNOSIS — N529 Male erectile dysfunction, unspecified: Secondary | ICD-10-CM | POA: Diagnosis not present

## 2022-07-19 ENCOUNTER — Encounter: Payer: Self-pay | Admitting: Nurse Practitioner

## 2022-07-19 ENCOUNTER — Ambulatory Visit (INDEPENDENT_AMBULATORY_CARE_PROVIDER_SITE_OTHER): Payer: PPO | Admitting: Nurse Practitioner

## 2022-07-19 VITALS — BP 100/62 | HR 60 | Temp 98.7°F | Resp 16 | Ht 75.0 in | Wt 203.2 lb

## 2022-07-19 DIAGNOSIS — R0982 Postnasal drip: Secondary | ICD-10-CM | POA: Insufficient documentation

## 2022-07-19 DIAGNOSIS — R051 Acute cough: Secondary | ICD-10-CM | POA: Diagnosis not present

## 2022-07-19 LAB — POC COVID19 BINAXNOW: SARS Coronavirus 2 Ag: NEGATIVE

## 2022-07-19 MED ORDER — CETIRIZINE HCL 10 MG PO TABS: 10.0000 mg | ORAL_TABLET | Freq: Every day | ORAL | 0 refills | Status: DC

## 2022-07-19 MED ORDER — BENZONATATE 100 MG PO CAPS: 100.0000 mg | ORAL_CAPSULE | Freq: Three times a day (TID) | ORAL | 0 refills | Status: DC | PRN

## 2022-07-19 NOTE — Progress Notes (Signed)
Acute Office Visit  Subjective:     Patient ID: [name], male    DOB: [birth year], 77 y.o.   MRN: [id number]  Chief Complaint  Patient presents with   Cough    Tickle in the throat. If he eats something It makes him cough     Patient is in today for cough with a history of Afib, HTN, OSA, GERD, hypothyroidism, parkinson's disease.  Symptoms started approx 1.5 weeks to 2 weeks afo Covid vaccine: not up to date  Flu vaccine: not up to date Sick contacts: patient was recently in the hospital. States that his wife was sick but she has improved  States that he has been using honey with lemon juice that did helps some. States that he is using the nasal spray (flonase) daily    Review of Systems  Constitutional:  Negative for chills, fever and malaise/fatigue.       Appetitie is normal Fluid intake is normal   HENT:  Positive for sinus pain. Negative for ear discharge, ear pain and sore throat.   Respiratory:  Positive for cough and sputum production (states that he does have it intermittent).   Neurological:  Positive for headaches.        Objective:    BP 100/62   Pulse 60   Temp 98.7 F (37.1 C)   Resp 16   Ht 6\' 3"  (1.905 m)   Wt 203 lb 3 oz (92.2 kg)   SpO2 96%   BMI 25.40 kg/m    Physical Exam Vitals and nursing note reviewed.  Constitutional:      Appearance: Normal appearance.  HENT:     Right Ear: Tympanic membrane, ear canal and external ear normal.     Left Ear: Tympanic membrane, ear canal and external ear normal.     Nose:     Right Sinus: No maxillary sinus tenderness or frontal sinus tenderness.     Left Sinus: No maxillary sinus tenderness or frontal sinus tenderness.     Mouth/Throat:     Mouth: Mucous membranes are moist.     Pharynx: Oropharynx is clear.  Cardiovascular:     Rate and Rhythm: Normal rate and regular rhythm.     Heart sounds: Normal heart sounds.  Pulmonary:     Effort: Pulmonary effort is normal.     Breath  sounds: Normal breath sounds.  Lymphadenopathy:     Cervical: No cervical adenopathy.  Neurological:     Mental Status: He is alert.     Results for orders placed or performed in visit on 07/19/22  POC COVID-19 BinaxNow  Result Value Ref Range   SARS Coronavirus 2 Ag Negative Negative        Assessment & Plan:   Problem List Items Addressed This Visit       Other   Acute cough - Primary    No acute signs of infectious processes.  Likely secondary to postnasal drip.  Continue Flonase nasal spray Tessalon Perles 100 mg 3 times daily as needed and Zyrtec 10 mg nightly.  Follow-up if no improvement      Relevant Medications   benzonatate (TESSALON) 100 MG capsule   cetirizine (ZYRTEC) 10 MG tablet   Other Relevant Orders   POC COVID-19 BinaxNow (Completed)   PND (post-nasal drip)    Cough likely secondary to postnasal drip.  Patient is already on fluticasone twice daily.  Will add on Claritin 10 mg nightly.      Relevant  Medications   cetirizine (ZYRTEC) 10 MG tablet    Meds ordered this encounter  Medications   benzonatate (TESSALON) 100 MG capsule    Sig: Take 1 capsule (100 mg total) by mouth 3 (three) times daily as needed for cough.    Dispense:  21 capsule    Refill:  0    Order Specific Question:   Supervising Provider    Answer:   Glori Bickers MARNE A [[id number]]   cetirizine (ZYRTEC) 10 MG tablet    Sig: Take 1 tablet (10 mg total) by mouth daily.    Dispense:  30 tablet    Refill:  0    Order Specific Question:   Supervising Provider    Answer:   TOWER, MARNE A [[id number]]    Return if symptoms worsen or fail to improve.  Romilda Garret, NP

## 2022-07-19 NOTE — Assessment & Plan Note (Signed)
No acute signs of infectious processes.  Likely secondary to postnasal drip.  Continue Flonase nasal spray Tessalon Perles 100 mg 3 times daily as needed and Zyrtec 10 mg nightly.  Follow-up if no improvement

## 2022-07-19 NOTE — Assessment & Plan Note (Signed)
Cough likely secondary to postnasal drip.  Patient is already on fluticasone twice daily.  Will add on Claritin 10 mg nightly.

## 2022-07-19 NOTE — Patient Instructions (Signed)
Nice to see you today I have sent in some cough pills and an antihistamine for you Continue using the Flonase Follow up if you do not improve

## 2022-07-25 NOTE — Progress Notes (Unsigned)
Assessment/Plan:   1.  Parkinsons Disease with levodopa resistant tremor   -Continue pramipexole 0.5 mg three times per day  -continue Rytary 195 tid.  Samples provided  -he is interested in focused ultrasound.  He and I discussed differences with DBS and focused ultrasound and discussed that DBS is generally considered superior but he wants to pursue focused ultrasound.  I sent Dr. Carlyn Reichert an email and will send referral as well  -we will send for balance PT and discussed exercise  2.  Atrial fibrillation hx  -off Xarelto for quite some time due to no recurrence of a-fib, which occurred post op.  Follows with Dr. Jacinto Halim  3.  Insomnia  -Continue mirtazapine, 15 mg at bed   4.  Back pain  -Sees Dr. Lovell Sheehan. Pt had surgery in July, 2023     Subjective:   [name] was seen today in follow up for Parkinsons disease.  My previous records were reviewed prior to todays visit as well as outside records available to me.   Wife with patient who supplements the history.  Patient currently on Rytary and pramipexole.  He has had no falls since last visit.  No lightheadedness or near syncope.  He was in the hospital in March for hyponatremia of 121 (he has a hx of similar down to 112).    He also had abdominal pain and distended colon.  CT abdomen was negative, but he was treated for colitis and symptoms resolved.  Patient followed up with primary care after discharge and declined physical therapy.  No falls.   He hasn't been exercising much.  Balance has not been good.  Has had more tremor.    Current prescribed movement disorder medications: Pramipexole 0.5 mg three times per day Rytary 195 mg, 1 capsule 3 times per day Mirtazapine, 15 mg at bed Propranolol 20 mg bid  PREVIOUS MEDICATIONS: Levodopa IR (nausea); carbidopa/levodopa 25/100 CR (nausea)  ALLERGIES:   Allergies  Allergen Reactions   Flexeril [Cyclobenzaprine] Other (See Comments)    Constipation   Hctz  [Hydrochlorothiazide] Other (See Comments)    Hyponatremia    Morphine And Related Hives and Other (See Comments)    Hallucinations Can tolerate oxycodone   Phenergan [Promethazine] Other (See Comments)    Told to avoid due to Parkinson's   Sinemet [Carbidopa-Levodopa] Other (See Comments)    Intolerant of short acting form   Tambocor [Flecainide] Nausea And Vomiting    CURRENT MEDICATIONS:  Outpatient Encounter Medications as of 07/27/2022  Medication Sig   acetaminophen (TYLENOL) 500 MG tablet Take 1,000 mg by mouth daily as needed for moderate pain.   benzonatate (TESSALON) 100 MG capsule Take 1 capsule (100 mg total) by mouth 3 (three) times daily as needed for cough.   Carbidopa-Levodopa ER (RYTARY) 48.75-195 MG CPCR Take 1 capsule by mouth See admin instructions. 1 capsule 3 times daily at 0800, 1300, 1800   cetirizine (ZYRTEC) 10 MG tablet Take 1 tablet (10 mg total) by mouth daily.   diclofenac Sodium (VOLTAREN) 1 % GEL Apply 1 Application topically 4 (four) times daily as needed (pain.).   diltiazem (CARDIZEM CD) 180 MG 24 hr capsule TAKE 1 CAPSULE BY MOUTH EVERY DAY   docusate sodium (COLACE) 100 MG capsule Take 1 capsule (100 mg total) by mouth 2 (two) times daily.   fluticasone (FLONASE) 50 MCG/ACT nasal spray Place 2 sprays into both nostrils daily.   Multiple Vitamins-Minerals (MULTIVITAMIN MEN 50+) TABS Take 1 tablet by  mouth daily.   omeprazole (PRILOSEC OTC) 20 MG tablet Take 20 mg by mouth daily as needed (heartburn).   pramipexole (MIRAPEX) 0.5 MG tablet TAKE 1 TABLET(0.5 MG) BY MOUTH THREE TIMES DAILY   propranolol (INDERAL) 20 MG tablet TAKE 1 TABLET BY MOUTH TWICE A DAY   No facility-administered encounter medications on file as of 07/27/2022.    Objective:   PHYSICAL EXAMINATION:    VITALS:   Vitals:   07/27/22 0805  BP: 112/68  Pulse: 64  Resp: 20  SpO2: 97%  Weight: 200 lb (90.7 kg)  Height: 6\' 3"  (1.905 m)    GEN:  The patient appears stated age  and is in NAD. HEENT:  Normocephalic, atraumatic.  The mucous membranes are moist. The superficial temporal arteries are without ropiness or tenderness.   Neurological examination:  Orientation: The patient is alert and oriented x3. Cranial nerves: There is good facial symmetry with facial hypomimia. The speech is fluent and clear. Soft palate rises symmetrically and there is no tongue deviation. Hearing is intact to conversational tone. Sensation: Sensation is intact to light touch throughout Motor: Strength is at least antigravity x4.   Movement examination: Tone: There is nl tone in the UE/LE Abnormal movements: he has rare tremor on the R today Coordination:  There is no decremation today with any form of RAMS, including alternating supination and pronation of the forearm, hand opening and closing, finger taps, heel taps and toe taps.  Gait and Station: The patient has no difficulty arising out of a deep-seated chair without the use of the hands. The patient's stride length is decreased and he is forward flexed (he is having back pain).  He is shuffling  I have reviewed and interpreted the following labs independently    Chemistry      Component Value Date/Time   NA 127 (L) 07/10/2022 0913   NA 139 07/01/2020 1021   K 4.3 07/10/2022 0913   CL 94 (L) 07/10/2022 0913   CO2 27 07/10/2022 0913   BUN 10 07/10/2022 0913   BUN 12 07/01/2020 1021   CREATININE 1.19 07/10/2022 0913   CREATININE 1.06 01/20/2022 0858      Component Value Date/Time   CALCIUM 9.2 07/10/2022 0913   ALKPHOS 64 06/29/2022 1336   AST 20 06/29/2022 1336   ALT 15 06/29/2022 1336   BILITOT 1.0 06/29/2022 1336       Lab Results  Component Value Date   WBC 6.9 07/10/2022   HGB 13.4 07/10/2022   HCT 38.9 (L) 07/10/2022   MCV 90.7 07/10/2022   PLT 342.0 07/10/2022    Lab Results  Component Value Date   TSH 3.50 07/15/2021   Total time spent on today's visit was 30 minutes, including both  face-to-face time and nonface-to-face time.  Time included that spent on review of records (prior notes available to me/labs/imaging if pertinent), discussing treatment and goals, answering patient's questions and coordinating care.   Cc:  Joaquim Nam, MD

## 2022-07-27 ENCOUNTER — Ambulatory Visit (INDEPENDENT_AMBULATORY_CARE_PROVIDER_SITE_OTHER): Payer: PPO | Admitting: Neurology

## 2022-07-27 ENCOUNTER — Encounter: Payer: Self-pay | Admitting: Neurology

## 2022-07-27 VITALS — BP 112/68 | HR 64 | Resp 20 | Ht 75.0 in | Wt 200.0 lb

## 2022-07-27 DIAGNOSIS — G20A1 Parkinson's disease without dyskinesia, without mention of fluctuations: Secondary | ICD-10-CM | POA: Diagnosis not present

## 2022-07-27 NOTE — Patient Instructions (Signed)
Good to see you today! I sent the PT referral I sent the referral to [facility] for consideration for focused ultrasound   The physicians and staff at [facility] are committed to providing excellent care. You may receive a survey requesting feedback about your experience at our office. We strive to receive "very good" responses to the survey questions. If you feel that your experience would prevent you from giving the office a "very good " response, please contact our office to try to remedy the situation. We may be reached at [phone]. Thank you for taking the time out of your busy day to complete the survey.

## 2022-07-28 ENCOUNTER — Telehealth: Payer: Self-pay | Admitting: Family Medicine

## 2022-07-28 NOTE — Telephone Encounter (Signed)
Patient was seen on 4/3 for acute cough, prescribed medication listed below. States that the medication does not seem to be helping, still is dealing with a cough. Would like a call back as far as what to do and to possibly change medications. Please advise [phone].  benzonatate (TESSALON) 100 MG capsule

## 2022-07-28 NOTE — Telephone Encounter (Signed)
I would try tessalon 200mg  per dose for cough.  Let me know if he needs a refill.  See if that helps. That is likely the safest option.  He can take it with plain mucinex if needed.  Not mucinex D.

## 2022-07-30 NOTE — Telephone Encounter (Signed)
Valentino Nose, RN  You2 days ago   AT Pt reports that tessalon has not been effective.  He will try the mucinex and is aware that he should not use mucinex d.  Also asked pt if he has ever done the saline rinses and he has tried those in the past.  Encouraged him to use it at night to rinse the pollen from his sinuses.  Pt reported understanding.

## 2022-07-31 ENCOUNTER — Encounter: Payer: Self-pay | Admitting: Internal Medicine

## 2022-07-31 ENCOUNTER — Ambulatory Visit: Payer: PPO | Admitting: Internal Medicine

## 2022-07-31 ENCOUNTER — Ambulatory Visit: Payer: PPO | Admitting: Student

## 2022-07-31 VITALS — BP 128/77 | HR 53 | Ht 75.0 in | Wt 205.2 lb

## 2022-07-31 DIAGNOSIS — I10 Essential (primary) hypertension: Secondary | ICD-10-CM

## 2022-07-31 DIAGNOSIS — I480 Paroxysmal atrial fibrillation: Secondary | ICD-10-CM | POA: Diagnosis not present

## 2022-07-31 NOTE — Progress Notes (Signed)
Primary Physician/Referring:  Joaquim Nam, MD  Patient ID: [name], male    DOB: 04-Apr-1946, 77 y.o.   MRN: [id number]  Chief Complaint  Patient presents with  . Atrial Fibrillation  . Hypertension  . Follow-up   HPI:    ZEKI BEDROSIAN  is a 77 y.o. 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 fib with RVR. Echocardiogram revealed normal LVEF.  No wall motion abnormalities.   He was discharged from ED on flecainide and propanolol as well as Xarelto for anticoagulation, he converted to sinus rhythm upon discharge. Patient had previously had A fib in Dec 2020 after back surgery and spontaneously converted to sinus.   Anticoagulation was discontinued by shared decision with patient and Dr. Jacinto Halim on 09/10/2019 as he had had no recurrence of atrial fibrillation.  Flecainide was also discontinued as patient did not tolerate this due to severe fatigue.  Patient presents for follow-up.  He had called our office last month while he was in [place] with concerns of leg swelling and was advised to take Lasix temporarily.  He reports Lasix did not improve leg swelling, however when he returned to [facility] he received injections in both of his knees and following the steroid injection swelling and pain completely resolved.  Patient is now feeling well overall without specific complaints today.  Denies chest pain, dyspnea, leg edema, orthopnea.  He has not taken Lasix in the last 1 month.  He has had no known recurrence of atrial fibrillation.  Past Medical History:  Diagnosis Date  . A-fib   . Arthritis   . GERD (gastroesophageal reflux disease)   . H/O urinary frequency   . Heart murmur    as a child  . History of bronchitis   . HSV infection    oral  . Hypertension    improved after back pain treated with surgery  . Insomnia   . Insomnia    Improved with  Ambien, did not tolerate melatonin.  . Joint pain   . Parkinson disease   . Sleep apnea    no CPAP  . Vertigo 10/13/2014   recurrent; trigger is cool air on left ear, improved with daily antihistamine   Past Surgical History:  Procedure Laterality Date  . ACHILLES TENDON REPAIR Left   . ALLOGRAFT APPLICATION Left 11/12/2020   Procedure: APPLICATION OF  INTEGRA;  Surgeon: Glenna Fellows, MD;  Location: [facility];  Service: Plastics;  Laterality: Left;  . BACK SURGERY    . COLONOSCOPY  02/2011   Negative,[facility]  . DEBRIDEMENT AND CLOSURE WOUND Left 02/25/2018   Procedure: DEBRIDEMENT LEFT LEG APPLICATION INTEGRA;  Surgeon: Glenna Fellows, MD;  Location: [facility];  Service: Plastics;  Laterality: Left;  . EYE SURGERY Bilateral    cataracts  . INCISION AND DRAINAGE OF WOUND Left 11/12/2020   Procedure: DEBRIDEMENT LLE ULCER;  Surgeon: Glenna Fellows, MD;  Location: [facility];  Service: Plastics;  Laterality: Left;  . KNEE ARTHROSCOPY     L  . LUMBAR FUSION     2016  . LUMBAR SPINE SURGERY  03/31/2019   exploration of lumbar fusion w/L5-S1 decompression redo L4-5 fusion  . SPINE SURGERY  2003   cervical fusion  . TONSILLECTOMY    . varicoelectomy      Family History  Problem Relation Age of Onset  . Hypertension  Mother   . Asthma Mother   . Diabetes Father   . Stroke Father   . Parkinson's disease Father   . Cancer Sister   . Heart attack Paternal Grandfather        >55  . Occular Albinism Son   . Hypertension Son   . Occular Albinism Daughter   . Colon cancer Neg Hx   . Prostate cancer Neg Hx     Social History   Tobacco Use  . Smoking status: Never    Passive exposure: Never  . Smokeless tobacco: Never  Substance Use Topics  . Alcohol use: No   Marital Status: Married ROS  Review of Systems  Cardiovascular:  Negative for chest pain, claudication, dyspnea on exertion, leg swelling (resolved),  near-syncope, orthopnea, palpitations and syncope.  Musculoskeletal:  Positive for arthritis and back pain.  Gastrointestinal:  Negative for hematochezia and melena.  Neurological:  Positive for tremors. Negative for headaches and light-headedness.  Objective  There were no vitals taken for this visit.     07/27/2022    8:05 AM 07/19/2022    2:05 PM 07/10/2022    8:28 AM  Vitals with BMI  Height 6\' 3"  6\' 3"  6\' 3"   Weight 200 lbs 203 lbs 3 oz 204 lbs  BMI 25 25.4 25.5  Systolic 112 100 960  Diastolic 68 62 64  Pulse 64 60 75   No data found.   Physical Exam Vitals reviewed.  Neck:     Thyroid: No thyromegaly.  Cardiovascular:     Rate and Rhythm: Normal rate and regular rhythm.     Pulses: Intact distal pulses.     Heart sounds: Normal heart sounds, S1 normal and S2 normal. No murmur heard.    No gallop.     Comments: No JVD. Pulmonary:     Effort: Pulmonary effort is normal. No respiratory distress.     Breath sounds: Normal breath sounds. No wheezing, rhonchi or rales.  Musculoskeletal:     Cervical back: Neck supple.     Right lower leg: No edema.     Left lower leg: No edema.  Neurological:     Mental Status: He is alert.   Laboratory examination:   Recent Labs    06/30/22 0806 06/30/22 1052 07/01/22 0226 07/01/22 0640 07/01/22 1038 07/02/22 0448 07/10/22 0913  NA 123*   < > 129*   < > 131* 129* 127*  K 4.1  --  4.1  --   --  4.0 4.3  CL 95*  --  100  --   --  97* 94*  CO2 21*  --  22  --   --  23 27  GLUCOSE 89  --  83  --   --  93 91  BUN 10  --  9  --   --  9 10  CREATININE 1.47*  --  1.21  --   --  1.13 1.19  CALCIUM 7.9*  --  8.1*  --   --  8.4* 9.2  GFRNONAA 49*  --  >60  --   --  >60  --    < > = values in this interval not displayed.    CrCl cannot be calculated (Patient's most recent lab result is older than the maximum 21 days allowed.).     Latest Ref Rng & Units 07/10/2022    9:13 AM 07/02/2022    4:48 AM 07/01/2022   10:38 AM  CMP  Glucose 70 - 99 mg/dL 91  93    BUN 6 - 23 mg/dL 10  9    Creatinine 1.61 - 1.50 mg/dL 0.96  0.45    Sodium 409 - 145 mEq/L 127  129  131   Potassium 3.5 - 5.1 mEq/L 4.3  4.0    Chloride 96 - 112 mEq/L 94  97    CO2 19 - 32 mEq/L 27  23    Calcium 8.4 - 10.5 mg/dL 9.2  8.4        Latest Ref Rng & Units 07/10/2022    9:13 AM 07/01/2022    2:26 AM 06/30/2022    4:31 AM  CBC  WBC 4.0 - 10.5 K/uL 6.9  6.0  6.7   Hemoglobin 13.0 - 17.0 g/dL 81.1  91.4  78.2   Hematocrit 39.0 - 52.0 % 38.9  33.8  32.6   Platelets 150.0 - 400.0 K/uL 342.0  174  160    Lipid Panel     Component Value Date/Time   CHOL 167 08/01/2021 0828   TRIG 72.0 08/01/2021 0828   HDL 44.70 08/01/2021 0828   CHOLHDL 4 08/01/2021 0828   VLDL 14.4 08/01/2021 0828   LDLCALC 108 (H) 08/01/2021 0828   HEMOGLOBIN A1C No results found for: "HGBA1C", "MPG" TSH No results for input(s): "TSH" in the last 8760 hours.  Allergies   Allergies  Allergen Reactions  . Flexeril [Cyclobenzaprine] Other (See Comments)    Constipation  . Hctz [Hydrochlorothiazide] Other (See Comments)    Hyponatremia   . Morphine And Related Hives and Other (See Comments)    Hallucinations Can tolerate oxycodone  . Phenergan [Promethazine] Other (See Comments)    Told to avoid due to Parkinson's  . Sinemet [Carbidopa-Levodopa] Other (See Comments)    Intolerant of short acting form  . Tambocor [Flecainide] Nausea And Vomiting    Medications Prior to Visit:   Outpatient Medications Prior to Visit  Medication Sig Dispense Refill  . acetaminophen (TYLENOL) 500 MG tablet Take 1,000 mg by mouth daily as needed for moderate pain.    . benzonatate (TESSALON) 100 MG capsule Take 1 capsule (100 mg total) by mouth 3 (three) times daily as needed for cough. 21 capsule 0  . Carbidopa-Levodopa ER (RYTARY) 48.75-195 MG CPCR Take 1 capsule by mouth See admin instructions. 1 capsule 3 times daily at 0800, 1300, 1800    . cetirizine (ZYRTEC) 10 MG  tablet Take 1 tablet (10 mg total) by mouth daily. 30 tablet 0  . diclofenac Sodium (VOLTAREN) 1 % GEL Apply 1 Application topically 4 (four) times daily as needed (pain.).    Marland Kitchen diltiazem (CARDIZEM CD) 180 MG 24 hr capsule TAKE 1 CAPSULE BY MOUTH EVERY DAY 90 capsule 0  . docusate sodium (COLACE) 100 MG capsule Take 1 capsule (100 mg total) by mouth 2 (two) times daily. 60 capsule 0  . fluticasone (FLONASE) 50 MCG/ACT nasal spray Place 2 sprays into both nostrils daily.    . Multiple Vitamins-Minerals (MULTIVITAMIN MEN 50+) TABS Take 1 tablet by mouth daily.    Marland Kitchen omeprazole (PRILOSEC OTC) 20 MG tablet Take 20 mg by mouth daily as needed (heartburn).    . pramipexole (MIRAPEX) 0.5 MG tablet TAKE 1 TABLET(0.5 MG) BY MOUTH THREE TIMES DAILY 270 tablet 0  . propranolol (INDERAL) 20 MG tablet TAKE 1 TABLET BY MOUTH TWICE A DAY 180 tablet 0   No facility-administered medications prior to visit.   Final Medications at End  of Visit    No outpatient medications have been marked as taking for the 07/31/22 encounter (Office Visit) with Clotilde Dieter, DO.   Radiology:   Chest x-ray portable 04/24/2019: Mediastinum and hilar structures normal. Heart size normal. Lungs are clear. No pleural effusion or pneumothorax. Prior cervical spine fusion. Degenerative change thoracic spine. IMPRESSION: No acute cardiopulmonary disease.  Cardiac Studies:   Echocardiogram 04/25/2019:  Normal LV systolic function, EF 60 to 65%.  No other significant valvular abnormalities.  Essentially normal echocardiogram.  Left atrial size is normal.  EKG  07/29/2021: Sinus rhythm at a rate of 65 beats per minute.  Normal axis.  Biatrial enlargement.  Right bundle branch block.  Compared EKG 10/08/2020, no significant change.  04/24/2019: Atrial flutter with 2: 1 conduction at the rate of 147 bpm. Rightward axis. Right bundle branch block. No evidence of ischemia. Compared to the study done on 03/27/2019, sinus rhythm is now  replaced by atrial flutter.  EKG 04/24/2019: A. Fibrillation with rapid response rate 147/minute, normal axis, no acute ischemia.  Assessment     ICD-10-CM   1. Paroxysmal atrial fibrillation  I48.0 EKG 12-Lead       No orders of the defined types were placed in this encounter.   There are no discontinued medications.         Recommendations:   MICKIE FELGER  is a 77 y.o. 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 fib with RVR. Echocardiogram revealed normal LVEF.  No wall motion abnormalities.   He was discharged from ED on flecainide and propanolol as well as Xarelto for anticoagulation, he converted to sinus rhythm upon discharge. Patient had previously had A fib in Dec 2020 after back surgery and spontaneously converted to sinus.   Anticoagulation was discontinued by shared decision with patient and Dr. Jacinto Halim on 09/10/2019 as he had had no recurrence of atrial fibrillation.  Flecainide was also discontinued as patient did not tolerate this due to severe fatigue. If patient does have recurrence of atrial fibrillation could consider ablation versus different antiarrhythmic agent as he did not tolerate flecainide.  Patient's leg edema has completely resolved following steroid injections in his knees.  He is overall feeling well.  Blood pressure is well controlled.  EKG and physical exam are unchanged compared to previous.  He is tolerating present medications without issue.  Patient is due for repeat lipid profile testing, have ordered this.  Will not make changes to medications at today's office visit.  Follow-up in 1 year, sooner if needed.   Clotilde Dieter, PA-C 07/31/2022, 11:37 AM Office: [phone]

## 2022-08-01 ENCOUNTER — Encounter: Payer: Self-pay | Admitting: Physical Therapy

## 2022-08-01 ENCOUNTER — Ambulatory Visit: Payer: PPO | Attending: Neurology | Admitting: Physical Therapy

## 2022-08-01 VITALS — BP 121/75 | HR 84

## 2022-08-01 DIAGNOSIS — R2681 Unsteadiness on feet: Secondary | ICD-10-CM | POA: Diagnosis not present

## 2022-08-01 DIAGNOSIS — R29818 Other symptoms and signs involving the nervous system: Secondary | ICD-10-CM | POA: Insufficient documentation

## 2022-08-01 DIAGNOSIS — R2689 Other abnormalities of gait and mobility: Secondary | ICD-10-CM | POA: Insufficient documentation

## 2022-08-01 DIAGNOSIS — R293 Abnormal posture: Secondary | ICD-10-CM | POA: Insufficient documentation

## 2022-08-01 DIAGNOSIS — G20A1 Parkinson's disease without dyskinesia, without mention of fluctuations: Secondary | ICD-10-CM | POA: Insufficient documentation

## 2022-08-01 NOTE — Therapy (Signed)
OUTPATIENT PHYSICAL THERAPY NEURO EVALUATION   Patient Name: [name] MRN: [id number] DOB:Jul 20, 1945, 77 y.o., male Today's Date: 08/01/2022   PCP:  Joaquim Nam, MD    REFERRING PROVIDER:  Vladimir Faster, DO  END OF SESSION:  PT End of Session - 08/01/22 0930     Visit Number 1    Number of Visits 13    Date for PT Re-Evaluation 09/30/22    Authorization Type [employer]    PT Start Time 0923    PT Stop Time 1008    PT Time Calculation (min) 45 min    Equipment Utilized During Treatment Gait belt    Activity Tolerance Patient tolerated treatment well    Behavior During Therapy WFL for tasks assessed/performed             Past Medical History:  Diagnosis Date   A-fib    Arthritis    GERD (gastroesophageal reflux disease)    H/O urinary frequency    Heart murmur    as a child   History of bronchitis    HSV infection    oral   Hypertension    improved after back pain treated with surgery   Insomnia    Insomnia    Improved with Ambien, did not tolerate melatonin.   Joint pain    Parkinson disease    Sleep apnea    no CPAP   Vertigo 10/13/2014   recurrent; trigger is cool air on left ear, improved with daily antihistamine   Past Surgical History:  Procedure Laterality Date   ACHILLES TENDON REPAIR Left    ALLOGRAFT APPLICATION Left 11/12/2020   Procedure: APPLICATION OF  INTEGRA;  Surgeon: Glenna Fellows, MD;  Location: [facility];  Service: Plastics;  Laterality: Left;   BACK SURGERY     COLONOSCOPY  02/2011   Negative,[facility]   DEBRIDEMENT AND CLOSURE WOUND Left 02/25/2018   Procedure: DEBRIDEMENT LEFT LEG APPLICATION INTEGRA;  Surgeon: Glenna Fellows, MD;  Location: [facility];  Service: Plastics;  Laterality: Left;   EYE SURGERY Bilateral    cataracts   INCISION AND DRAINAGE OF WOUND Left 11/12/2020   Procedure: DEBRIDEMENT LLE ULCER;  Surgeon: Glenna Fellows, MD;  Location:  [facility];  Service: Plastics;  Laterality: Left;   KNEE ARTHROSCOPY     L   LUMBAR FUSION     2016   LUMBAR SPINE SURGERY  03/31/2019   exploration of lumbar fusion w/L5-S1 decompression redo L4-5 fusion   SPINE SURGERY  2003   cervical fusion   TONSILLECTOMY     varicoelectomy     Patient Active Problem List   Diagnosis Date Noted   PND (post-nasal drip) 07/19/2022   Chest pain 07/02/2022   Dysuria 07/02/2022   Colitis 06/29/2022   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   Paroxysmal atrial fibrillation 07/15/2021   Irritant dermatitis 04/07/2021   Tinea pedis of both feet 04/07/2021   Peripheral edema 04/07/2021   Acute bronchitis 02/28/2021   Other fatigue 01/02/2021   Change in bowel habit 08/06/2020   Healthcare maintenance 03/31/2020   Hypothyroidism 03/31/2020   Dysfunction of eustachian tube 03/10/2020   Constipation 11/19/2019   Insomnia    Allergic rhinitis 07/03/2019   Hyponatremia 05/15/2019   Atrial fibrillation, chronic 05/15/2019   Parkinson's disease 05/04/2019   Dementia due to Parkinson's disease without behavioral disturbance    Atrial fibrillation with RVR 04/24/2019   History of recent back  surgery 04/24/2019   Body mass index (BMI) 25.0-25.9, adult 04/22/2019   Neural foraminal stenosis of lumbosacral spine 03/31/2019   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   Degenerative scoliosis in adult patient 03/20/2019   Other intervertebral disc displacement, lumbosacral region 03/20/2019   Status post lumbar spinal fusion 02/20/2019   Neck pain 02/04/2019   Intermittent tremor 12/09/2018   Acute cough 04/24/2018   Skin lesion 10/31/2017   Lower urinary tract symptoms (LUTS) 04/17/2016   Oral lesion 02/16/2016   Medicare annual wellness visit, initial 12/01/2015   Advance care planning 12/01/2015   Lower back pain 12/01/2015   OSA on CPAP    HTN (hypertension) 04/23/2015   Spondylolisthesis of  lumbar region 12/16/2014   GERD (gastroesophageal reflux disease) 10/11/2012   RBBB 06/27/2011    ONSET DATE: 07/27/2022  REFERRING DIAG: G20.A1 (ICD-10-CM) - Parkinson's disease without dyskinesia or fluctuating manifestations   THERAPY DIAG:  Abnormal posture  Other abnormalities of gait and mobility  Other symptoms and signs involving the nervous system  Unsteadiness on feet  Rationale for Evaluation and Treatment: Rehabilitation  SUBJECTIVE:                                                                                                                                                                                             SUBJECTIVE STATEMENT: Reports when he stands up, feels like he is going to wobble. Has a hx of vertigo, where things will look like they're moving. Has tremors on his R side.  Looking into doing the DBS or focused ultrasound. Balance is feeling off. If he closes his eyes, feels like he is going to fall. Very slow when turning. Can't get up out of a chair and start walking. Uses a walking stick for balance. Reports his RUE tremors will affect his walking. Saw the cardiologist yesterday and was taken off propranolol. Has an e-bike and rides this for exercise. Using lidocaine patches for his back pain and these help. No falls. Denies freezing episodes.   Pt accompanied by:  Wife, Burna Mortimer   PERTINENT HISTORY: PMH: PD (diagnosed ~2020), HTN, A-fib, arthritis, hx of vertigo, back pain with hx of back surgery (2020, 2023), hx of LLE injury as a kid   PAIN:  Are you having pain? Yes: NPRS scale: 4-5/10 Pain location: Low back, sometimes will go to the R side  Pain description: Aching, Sore Aggravating factors: Bending over  Relieving factors: Sitting down, not doing anything   Vitals:   08/01/22 0939  BP: 121/75  Pulse: 84     PRECAUTIONS: Fall  WEIGHT BEARING RESTRICTIONS: No  FALLS:  Has patient fallen in last 6 months? No  LIVING ENVIRONMENT: Lives  with: lives with their spouse Lives in: House/apartment Stairs: Yes: Internal: 12 steps; on right going up and External: 2 steps; none - pt does not have to use the stairs inside for the basement  Has following equipment at home: Walker - 2 wheeled, Grab bars, and walking stick  PLOF: Independent, except unable to tie his shoes (due to stiffness from his back)  PATIENT GOALS: Wants to work on his balance   OBJECTIVE:   COGNITION: Overall cognitive status: Within functional limits for tasks assessed   SENSATION: Pt reports numbness in feet after back surgery   COORDINATION: Heel to shin: WNL bilat   OBSERVATIONS: RUE tremor, resting   POSTURE: rounded shoulders and forward head  LOWER EXTREMITY ROM:     Limited knee extension AROM bilat   LOWER EXTREMITY MMT:    MMT Right Eval Left Eval  Hip flexion 4+ 5  Hip extension    Hip abduction 5 5  Hip adduction 5 5  Hip internal rotation    Hip external rotation    Knee flexion 5 5  Knee extension 4+ 4+  Ankle dorsiflexion 4 4+  Ankle plantarflexion    Ankle inversion    Ankle eversion    (Blank rows = not tested)  All tested in sitting   BED MOBILITY:  Pt reports independence with bed mobility   TRANSFERS: Assistive device utilized: None  Sit to stand: SBA Stand to sit: SBA Pt puts his hands on his thighs, pt with incr forward flexed posture in standing   Pt reports sometimes getting up from the recliner is hard. Pt has difficulty getting off the floor.    GAIT: Gait pattern: step through pattern, decreased arm swing- Right, decreased arm swing- Left, decreased stride length, Right foot flat, Left foot flat, decreased trunk rotation, trunk flexed, and poor foot clearance- Right Distance walked: Clinic distances  Assistive device utilized: None Level of assistance: SBA Comments: Pt holds his arms behind his back or in his pockets due to RUE tremors during gait. Pt reports tremors during gait throws off his  balance. Slow when turning with decr step height.   FUNCTIONAL TESTS:  5 times sit to stand: 18.72 seconds with hands on knees  10 meter walk test: 13.9 seconds = 2.36 ft/sec   [facility] PT Assessment - 08/01/22 0947       Standardized Balance Assessment   Standardized Balance Assessment Mini-BESTest;Timed Up and Go Test      Mini-BESTest   Sit To Stand Normal: Comes to stand without use of hands and stabilizes independently.    Rise to Toes < 3 s.    Stand on one leg (left) Moderate: < 20 s   2 seconds   Stand on one leg (right) Moderate: < 20 s   1 second   Stand on one leg - lowest score 1    Stance - Feet together, eyes open, firm surface  Normal: 30s    Stance - Feet together, eyes closed, foam surface  Moderate: < 30s   1-2 seconds   Incline - Eyes Closed Moderate: Stands independently < 30s OR aligns with surface   incr forward lean   Timed UP & GO with Dual Task Moderate: Dual Task affects either counting OR walking (>10%) when compared to the TUG without Dual Task.      Timed Up and Go Test   Normal TUG (seconds) 11.06  Manual TUG (seconds) 11.65    Cognitive TUG (seconds) 13.3   with incorrect counting backwards by 3   TUG Comments Slow when turning            Will finish miniBEST at next session, ran out of time during eval   TODAY'S TREATMENT:                                                                                                                              N/A during eval    PATIENT EDUCATION: Education details: Clinical findings, POC, purpose of PD specific therapy, discussed possibility of OT eval and what OT would work on - pt wants to think about it and hold off at this time.  Person educated: Patient and Spouse Education method: Explanation Education comprehension: verbalized understanding  HOME EXERCISE PROGRAM: Will provide at future session.   GOALS: Goals reviewed with patient? Yes  SHORT TERM GOALS: Target date: 08/22/2022  Pt will be  independent with initial HEP for PD specific exercises in order to build upon functional gains made in therapy. Baseline: Goal status: INITIAL  2.  Finish miniBEST with LTG written. Baseline:  Goal status: INITIAL  3.  Pt will improve gait speed with no AD to at least 2.6 ft/sec in order to demo improved community mobility.   Baseline: 13.9 seconds = 2.36 ft/sec Goal status: INITIAL    LONG TERM GOALS: Target date: 09/12/2022   Pt will be independent with final HEP for PD specific exercises in order to build upon functional gains made in therapy. Baseline:  Goal status: INITIAL  2.  miniBEST goal to be written. Baseline:  Goal status: INITIAL  3.   Pt will improve gait speed with no AD to at least 2.9 ft/sec in order to demo improved community mobility. Baseline: 13.9 seconds = 2.36 ft/sec Goal status: INITIAL  4.  Pt will improve 5x sit<>stand to less than or equal to 15 sec to demonstrate improved functional strength and transfer efficiency.   Baseline: 18.72 seconds with hands on knees Goal status: INITIAL  5.  Pt will verbalize understanding of local Parkinson's disease resources, including options for continue community fitness.  Baseline:  Goal status: INITIAL    ASSESSMENT:  CLINICAL IMPRESSION: Patient is a 77 year old male referred to Neuro OPPT for PD.   Pt's PMH is significant for: PD (diagnosed ~2020), HTN, A-fib, arthritis, hx of vertigo, back pain with hx of back surgery (2020, 2023), hx of LLE injury as a ki. The following deficits were present during the exam: impaired balance, impaired timing/coordination of gait, decr strength, postural abnormalities, gait abnormalities, decr flexibility, RUE tremors. Based on 5x sit <> stand, pt is an incr risk for falls. Pt's gait speed indicates a limited community ambulator. Will finish miniBEST at next session. Pt would benefit from skilled PT to address these impairments and functional limitations to maximize  functional mobility independence and decr fall risk.  OBJECTIVE IMPAIRMENTS: Abnormal gait, decreased activity tolerance, decreased balance, decreased coordination, difficulty walking, decreased strength, hypomobility, impaired flexibility, postural dysfunction, and pain.   ACTIVITY LIMITATIONS: lifting, transfers, and locomotion level  PARTICIPATION LIMITATIONS: community activity  PERSONAL FACTORS: Age, Behavior pattern, Past/current experiences, Time since onset of injury/illness/exacerbation, and 3+ comorbidities: PD (diagnosed ~2020), HTN, A-fib, arthritis, hx of vertigo, back pain with hx of back surgery (2020, 2023), hx of LLE injury as a kid   are also affecting patient's functional outcome.   REHAB POTENTIAL: Good  CLINICAL DECISION MAKING: Stable/uncomplicated  EVALUATION COMPLEXITY: Moderate  PLAN:  PT FREQUENCY: 2x/week  PT DURATION: 8 weeks - anticipate just 6 weeks  PLANNED INTERVENTIONS: Therapeutic exercises, Therapeutic activity, Neuromuscular re-education, Balance training, Gait training, Patient/Family education, Self Care, Stair training, Vestibular training, DME instructions, Manual therapy, and Re-evaluation  PLAN FOR NEXT SESSION: finish miniBEST and write goal as appropriate. Initial HEP for sit <> stands, standing PWR, balance based on miniBEST    Khayman Kirsch Heloise Beecham, PT, DPT  08/01/2022, 10:12 AM

## 2022-08-03 NOTE — Progress Notes (Deleted)
Office Visit Note  Patient: [name]             Date of Birth: Nov 28, 1945           MRN: [id number]             PCP: Joaquim Nam, MD Referring: Joaquim Nam, MD Visit Date: 08/17/2022 Occupation: @  Subjective:  No chief complaint on file.   History of Present Illness: [name] is a 77 y.o. male ***     Activities of Daily Living:  Patient reports morning stiffness for *** {minute/hour:19697}.   Patient {ACTIONS;DENIES/REPORTS:21021675::"Denies"} nocturnal pain.  Difficulty dressing/grooming: {ACTIONS;DENIES/REPORTS:21021675::"Denies"} Difficulty climbing stairs: {ACTIONS;DENIES/REPORTS:21021675::"Denies"} Difficulty getting out of chair: {ACTIONS;DENIES/REPORTS:21021675::"Denies"} Difficulty using hands for taps, buttons, cutlery, and/or writing: {ACTIONS;DENIES/REPORTS:21021675::"Denies"}  No Rheumatology ROS completed.   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   Paroxysmal atrial fibrillation 07/15/2021   Irritant dermatitis 04/07/2021   Tinea pedis of both feet 04/07/2021   Peripheral edema 04/07/2021   Acute bronchitis 02/28/2021   Other fatigue 01/02/2021   Change in bowel habit 08/06/2020   Healthcare maintenance 03/31/2020   Hypothyroidism 03/31/2020   Dysfunction of eustachian tube 03/10/2020   Constipation 11/19/2019   Insomnia    Allergic rhinitis 07/03/2019   Hyponatremia 05/15/2019   Atrial fibrillation, chronic 05/15/2019   Parkinson's disease 05/04/2019   Dementia due to Parkinson's disease without behavioral disturbance    Atrial fibrillation with RVR 04/24/2019   History of recent back surgery 04/24/2019   Body mass index (BMI) 25.0-25.9, adult 04/22/2019   Neural foraminal stenosis of lumbosacral spine 03/31/2019    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   Degenerative scoliosis in adult patient 03/20/2019   Other intervertebral disc displacement, lumbosacral region 03/20/2019   Status post lumbar spinal fusion 02/20/2019   Neck pain 02/04/2019   Intermittent tremor 12/09/2018   Acute cough 04/24/2018   Skin lesion 10/31/2017   Lower urinary tract symptoms (LUTS) 04/17/2016   Oral lesion 02/16/2016   Medicare annual wellness visit, initial 12/01/2015   Advance care planning 12/01/2015   Lower back pain 12/01/2015   OSA on CPAP    HTN (hypertension) 04/23/2015   Spondylolisthesis of lumbar region 12/16/2014   GERD (gastroesophageal reflux disease) 10/11/2012   RBBB 06/27/2011    Past Medical History:  Diagnosis Date   A-fib    Arthritis    GERD (gastroesophageal reflux disease)    H/O urinary frequency    Heart murmur    as a child   History of bronchitis    HSV infection    oral   Hypertension    improved after back pain treated with surgery   Insomnia    Insomnia    Improved with Ambien, did not tolerate melatonin.   Joint pain    Parkinson disease    Sleep apnea    no CPAP   Vertigo 10/13/2014   recurrent; trigger is cool air on left ear, improved with daily antihistamine    Family History  Problem Relation Age of Onset   Hypertension Mother    Asthma Mother    Diabetes Father    Stroke Father    Parkinson's disease Father    Cancer Sister    Heart attack Paternal Grandfather        >  55   Occular Albinism Son    Hypertension Son    Occular Albinism Daughter    Colon cancer Neg Hx    Prostate cancer Neg Hx    Past Surgical History:  Procedure Laterality Date   ACHILLES TENDON REPAIR Left    ALLOGRAFT APPLICATION Left 11/12/2020   Procedure: APPLICATION OF  INTEGRA;  Surgeon: Glenna Fellows, MD;  Location: [facility];  Service: Plastics;  Laterality: Left;   BACK SURGERY     COLONOSCOPY  02/2011   Negative,[facility]    DEBRIDEMENT AND CLOSURE WOUND Left 02/25/2018   Procedure: DEBRIDEMENT LEFT LEG APPLICATION INTEGRA;  Surgeon: Glenna Fellows, MD;  Location: [facility];  Service: Plastics;  Laterality: Left;   EYE SURGERY Bilateral    cataracts   INCISION AND DRAINAGE OF WOUND Left 11/12/2020   Procedure: DEBRIDEMENT LLE ULCER;  Surgeon: Glenna Fellows, MD;  Location: [facility];  Service: Plastics;  Laterality: Left;   KNEE ARTHROSCOPY     L   LUMBAR FUSION     2016   LUMBAR SPINE SURGERY  03/31/2019   exploration of lumbar fusion w/L5-S1 decompression redo L4-5 fusion   SPINE SURGERY  2003   cervical fusion   TONSILLECTOMY     varicoelectomy     Social History   Social History Narrative   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     Immunization History  Administered Date(s) Administered   Pneumococcal Conjugate-13 05/01/2017     Objective: Vital Signs: There were no vitals taken for this visit.   Physical Exam   Musculoskeletal Exam: ***  CDAI Exam: CDAI Score: -- Patient Global: --; Provider Global: -- Swollen: --; Tender: -- Joint Exam 08/17/2022   No joint exam has been documented for this visit   There is currently no information documented on the homunculus. Go to the Rheumatology activity and complete the homunculus joint exam.  Investigation: No additional findings.  Imaging: No results found.  Recent Labs: Lab Results  Component Value Date   WBC 6.9 07/10/2022   HGB 13.4 07/10/2022   PLT 342.0 07/10/2022   NA 127 (L) 07/10/2022   K 4.3 07/10/2022   CL 94 (L) 07/10/2022   CO2 27 07/10/2022   GLUCOSE 91 07/10/2022   BUN 10 07/10/2022   CREATININE 1.19 07/10/2022   BILITOT 1.0 06/29/2022   ALKPHOS 64 06/29/2022   AST 20  06/29/2022   ALT 15 06/29/2022   PROT 6.7 06/29/2022   ALBUMIN 3.6 06/29/2022   CALCIUM 9.2 07/10/2022   GFRAA >60 05/17/2019    Speciality Comments: No specialty comments available.  Procedures:  No procedures performed Allergies: Flexeril [cyclobenzaprine], Hctz [hydrochlorothiazide], Morphine and related, Phenergan [promethazine], Sinemet [carbidopa-levodopa], and Tambocor [flecainide]   Assessment / Plan:     Visit Diagnoses: No diagnosis found.  Orders: No orders of the defined types were placed in this encounter.  No orders of the defined types were placed in this encounter.   Face-to-face time spent with patient was *** minutes. Greater than 50% of time was spent in counseling and coordination of care.  Follow-Up Instructions: No follow-ups on file.   Ellen Henri, CMA  Note - This record has been created using Animal nutritionist.  Chart creation errors have been sought, but may not always  have been located. Such creation errors do not reflect on  the standard of medical care.

## 2022-08-04 ENCOUNTER — Other Ambulatory Visit (INDEPENDENT_AMBULATORY_CARE_PROVIDER_SITE_OTHER): Payer: PPO

## 2022-08-04 DIAGNOSIS — I482 Chronic atrial fibrillation, unspecified: Secondary | ICD-10-CM | POA: Diagnosis not present

## 2022-08-04 DIAGNOSIS — E871 Hypo-osmolality and hyponatremia: Secondary | ICD-10-CM

## 2022-08-04 DIAGNOSIS — I4510 Unspecified right bundle-branch block: Secondary | ICD-10-CM | POA: Diagnosis not present

## 2022-08-04 LAB — BASIC METABOLIC PANEL
BUN: 9 mg/dL (ref 6–23)
CO2: 23 mEq/L (ref 19–32)
Calcium: 8.6 mg/dL (ref 8.4–10.5)
Chloride: 101 mEq/L (ref 96–112)
Creatinine, Ser: 1.14 mg/dL (ref 0.40–1.50)
GFR: 62.63 mL/min (ref >60.00)
Glucose, Bld: 93 mg/dL (ref 70–99)
Potassium: 3.9 mEq/L (ref 3.5–5.1)
Sodium: 135 mEq/L (ref 135–145)

## 2022-08-04 LAB — CBC
HCT: 37.9 % — ABNORMAL LOW (ref 39.0–52.0)
Hemoglobin: 12.9 g/dL — ABNORMAL LOW (ref 13.0–17.0)
MCHC: 34.1 g/dL (ref 30.0–36.0)
MCV: 92.3 fl (ref 78.0–100.0)
Platelets: 228 10*3/uL (ref 150.0–400.0)
RBC: 4.11 Mil/uL — ABNORMAL LOW (ref 4.22–5.81)
RDW: 12.8 % (ref 11.5–15.5)
WBC: 6.8 10*3/uL (ref 4.0–10.5)

## 2022-08-04 LAB — FERRITIN: Ferritin: 53 ng/mL (ref 22.0–322.0)

## 2022-08-04 LAB — IRON: Iron: 58 ug/dL (ref 42–165)

## 2022-08-04 NOTE — Addendum Note (Signed)
Addended by: Eual Fines on: 08/04/2022 10:32 AM   Modules accepted: Orders

## 2022-08-08 ENCOUNTER — Telehealth: Payer: Self-pay | Admitting: Family Medicine

## 2022-08-08 ENCOUNTER — Ambulatory Visit: Payer: PPO | Admitting: Physical Therapy

## 2022-08-08 DIAGNOSIS — R293 Abnormal posture: Secondary | ICD-10-CM | POA: Diagnosis not present

## 2022-08-08 DIAGNOSIS — R2681 Unsteadiness on feet: Secondary | ICD-10-CM

## 2022-08-08 DIAGNOSIS — R2689 Other abnormalities of gait and mobility: Secondary | ICD-10-CM

## 2022-08-08 NOTE — Telephone Encounter (Signed)
Contacted Jeremiyah K Kun to schedule their annual wellness visit. Appointment made for 09/19/2022.  [facility] Care Guide [facility] AWV TEAM Direct Dial: [phone]

## 2022-08-08 NOTE — Therapy (Signed)
OUTPATIENT PHYSICAL THERAPY NEURO TREATMENT   Patient Name: [name] MRN: [id number] DOB:Nov 27, 1945, 77 y.o., male Today's Date: 08/08/2022   PCP:  Joaquim Nam, MD    REFERRING PROVIDER:  Vladimir Faster, DO  END OF SESSION:  PT End of Session - 08/08/22 0848     Visit Number 2    Number of Visits 13    Date for PT Re-Evaluation 09/30/22    Authorization Type [employer]    PT Start Time 0846    PT Stop Time 0931    PT Time Calculation (min) 45 min    Equipment Utilized During Treatment Gait belt    Activity Tolerance Patient tolerated treatment well    Behavior During Therapy WFL for tasks assessed/performed             Past Medical History:  Diagnosis Date   A-fib    Arthritis    GERD (gastroesophageal reflux disease)    H/O urinary frequency    Heart murmur    as a child   History of bronchitis    HSV infection    oral   Hypertension    improved after back pain treated with surgery   Insomnia    Insomnia    Improved with Ambien, did not tolerate melatonin.   Joint pain    Parkinson disease    Sleep apnea    no CPAP   Vertigo 10/13/2014   recurrent; trigger is cool air on left ear, improved with daily antihistamine   Past Surgical History:  Procedure Laterality Date   ACHILLES TENDON REPAIR Left    ALLOGRAFT APPLICATION Left 11/12/2020   Procedure: APPLICATION OF  INTEGRA;  Surgeon: Glenna Fellows, MD;  Location: [facility];  Service: Plastics;  Laterality: Left;   BACK SURGERY     COLONOSCOPY  02/2011   Negative,[facility]   DEBRIDEMENT AND CLOSURE WOUND Left 02/25/2018   Procedure: DEBRIDEMENT LEFT LEG APPLICATION INTEGRA;  Surgeon: Glenna Fellows, MD;  Location: [facility];  Service: Plastics;  Laterality: Left;   EYE SURGERY Bilateral    cataracts   INCISION AND DRAINAGE OF WOUND Left 11/12/2020   Procedure: DEBRIDEMENT LLE ULCER;  Surgeon: Glenna Fellows, MD;  Location: [facility];  Service: Plastics;  Laterality: Left;   KNEE ARTHROSCOPY     L   LUMBAR FUSION     2016   LUMBAR SPINE SURGERY  03/31/2019   exploration of lumbar fusion w/L5-S1 decompression redo L4-5 fusion   SPINE SURGERY  2003   cervical fusion   TONSILLECTOMY     varicoelectomy     Patient Active Problem List   Diagnosis Date Noted   PND (post-nasal drip) 07/19/2022   Chest pain 07/02/2022   Dysuria 07/02/2022   Colitis 06/29/2022   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   Paroxysmal atrial fibrillation 07/15/2021   Irritant dermatitis 04/07/2021   Tinea pedis of both feet 04/07/2021   Peripheral edema 04/07/2021   Acute bronchitis 02/28/2021   Other fatigue 01/02/2021   Change in bowel habit 08/06/2020   Healthcare maintenance 03/31/2020   Hypothyroidism 03/31/2020   Dysfunction of eustachian tube 03/10/2020   Constipation 11/19/2019   Insomnia    Allergic rhinitis 07/03/2019   Hyponatremia 05/15/2019   Atrial fibrillation, chronic 05/15/2019   Parkinson's disease 05/04/2019   Dementia due to Parkinson's disease without behavioral disturbance    Atrial fibrillation with RVR 04/24/2019   History of recent back  surgery 04/24/2019   Body mass index (BMI) 25.0-25.9, adult 04/22/2019   Neural foraminal stenosis of lumbosacral spine 03/31/2019   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   Degenerative scoliosis in adult patient 03/20/2019   Other intervertebral disc displacement, lumbosacral region 03/20/2019   Status post lumbar spinal fusion 02/20/2019   Neck pain 02/04/2019   Intermittent tremor 12/09/2018   Acute cough 04/24/2018   Skin lesion 10/31/2017   Lower urinary tract symptoms (LUTS) 04/17/2016   Oral lesion 02/16/2016   Medicare annual wellness visit, initial 12/01/2015   Advance care planning 12/01/2015   Lower back pain 12/01/2015   OSA on CPAP    HTN (hypertension) 04/23/2015   Spondylolisthesis of lumbar  region 12/16/2014   GERD (gastroesophageal reflux disease) 10/11/2012   RBBB 06/27/2011    ONSET DATE: 07/27/2022  REFERRING DIAG: G20.A1 (ICD-10-CM) - Parkinson's disease without dyskinesia or fluctuating manifestations   THERAPY DIAG:  Other abnormalities of gait and mobility  Unsteadiness on feet  Rationale for Evaluation and Treatment: Rehabilitation  SUBJECTIVE:                                                                                                                                                                                             SUBJECTIVE STATEMENT: Pt ambulated into clinic without AD. Reports he was replacing deck boards yesterday, so is bit sore from that. "It was a lot of up and down". No falls, but stubbed his R toe on a table.   Pt accompanied by:  Wife, Burna Mortimer (in lobby)   PERTINENT HISTORY: PMH: PD (diagnosed ~2020), HTN, A-fib, arthritis, hx of vertigo, back pain with hx of back surgery (2020, 2023), hx of LLE injury as a kid   PAIN:  Are you having pain? Yes: NPRS scale: 0/10 Pain location: Low back, sometimes will go to the R side  Pain description: Aching, Sore Aggravating factors: Bending over  Relieving factors: Sitting down, not doing anything      PRECAUTIONS: Fall  WEIGHT BEARING RESTRICTIONS: No  FALLS: Has patient fallen in last 6 months? No  LIVING ENVIRONMENT: Lives with: lives with their spouse Lives in: House/apartment Stairs: Yes: Internal: 12 steps; on right going up and External: 2 steps; none - pt does not have to use the stairs inside for the basement  Has following equipment at home: Walker - 2 wheeled, Grab bars, and walking stick  PLOF: Independent, except unable to tie his shoes (due to stiffness from his back)  PATIENT GOALS: Wants to work on his balance   OBJECTIVE:   COGNITION: Overall cognitive status: Within functional limits for tasks  assessed   SENSATION: Pt reports numbness in feet after back  surgery   COORDINATION: Heel to shin: WNL bilat   OBSERVATIONS: RUE tremor, resting   POSTURE: rounded shoulders and forward head  LOWER EXTREMITY ROM:     Limited knee extension AROM bilat   LOWER EXTREMITY MMT:    MMT Right Eval Left Eval  Hip flexion 4+ 5  Hip extension    Hip abduction 5 5  Hip adduction 5 5  Hip internal rotation    Hip external rotation    Knee flexion 5 5  Knee extension 4+ 4+  Ankle dorsiflexion 4 4+  Ankle plantarflexion    Ankle inversion    Ankle eversion    (Blank rows = not tested)  All tested in sitting   BED MOBILITY:  Pt reports independence with bed mobility   TRANSFERS: Assistive device utilized: None  Sit to stand: SBA Stand to sit: SBA Pt puts his hands on his thighs, pt with incr forward flexed posture in standing   Pt reports sometimes getting up from the recliner is hard. Pt has difficulty getting off the floor.    GAIT: Gait pattern: step through pattern, decreased arm swing- Right, decreased arm swing- Left, decreased stride length, Right foot flat, Left foot flat, decreased trunk rotation, trunk flexed, and poor foot clearance- Right Distance walked: Clinic distances  Assistive device utilized: None Level of assistance: SBA Comments: Pt holds his arms behind his back or in his pockets due to RUE tremors during gait. Pt reports tremors during gait throws off his balance. Slow when turning with decr step height.     TODAY'S TREATMENT:          Ther Act  [facility] PT Assessment - 08/08/22 0858       Mini-BESTest   Sit To Stand Normal: Comes to stand without use of hands and stabilizes independently.    Rise to Toes < 3 s.    Stand on one leg (left) Moderate: < 20 s   2s   Stand on one leg (right) Moderate: < 20 s   5.5s   Stand on one leg - lowest score 1    Compensatory Stepping Correction - Forward Normal: Recovers independently with a single, large step (second realignement is allowed).    Compensatory Stepping  Correction - Backward Normal: Recovers independently with a single, large step    Compensatory Stepping Correction - Left Lateral Normal: Recovers independently with 1 step (crossover or lateral OK)    Compensatory Stepping Correction - Right Lateral Normal: Recovers independently with 1 step (crossover or lateral OK)    Stepping Corredtion Lateral - lowest score 2    Stance - Feet together, eyes open, firm surface  Normal: 30s    Stance - Feet together, eyes closed, foam surface  Moderate: < 30s   1-2s   Incline - Eyes Closed Moderate: Stands independently < 30s OR aligns with surface   increased forward lean   Change in Gait Speed Normal: Significantly changes walkling speed without imbalance    Walk with head turns - Horizontal Normal: performs head turns with no change in gait speed and good balance    Walk with pivot turns Normal: Turns with feet close FAST (< 3 steps) with good balance.    Step over obstacles Normal: Able to step over box with minimal change of gait speed and with good balance.    Timed UP & GO with Dual Task Moderate: Dual Task affects  either counting OR walking (>10%) when compared to the TUG without Dual Task.    Mini-BEST total score 22            Educated pt on the 3 balance systems and importance of each. Informed pt that his vestibular function is reduced and likely contributing to his feelings of disequilibrium. Pt verbalized understanding  Ther Ex  Established initial HEP w/emphasis on vestibular input (see bolded below). Pt very challenged today due to R hand tremor, reported he has not taken his sinemet today due to the early appointment time. Attempted to have pt hold various items (6# DB, stick) in R hand during the exercises as pt reports this typically helps his tremor, but was unsuccessful today. Encouraged pt to bring his medicine and take it in clinic if needed and provided pt w/crackers to take medicine with today.  The following exercises were  performed w/back to corner for improved vestibular input and standing balance:  Romberg stance w/EC, 3x30s. Pt demonstrated moderate posterolateral sway to R side, frequently hitting wall to recover balance. Pt very bothered by R hand tremor, as it is directly correlated to pt's exertion level  Standing on pillows w/EC and head turns, 3x15s. Pt again more challenged by presence of hand tremor, but did note increased posterolateral sway when turning head to R side.    PATIENT EDUCATION: Education details: 3 balance systems, initial HEP, taking meds in clinic  Person educated: Patient Education method: Explanation, Demonstration, and Handouts Education comprehension: verbalized understanding  HOME EXERCISE PROGRAM: Access Code: [email or web address] URL: [email or web address] Date: 08/08/2022 Prepared by: Alethia Berthold Betania Dizon  Exercises - Corner Balance Feet Together With Eyes Closed  - 1 x daily - 7 x weekly - 3-4 reps - 30-45 second hold - Standing with eyes closed on old pillows/dog bed/blankets with head turns  - 1 x daily - 7 x weekly - 3-4 reps - 30-45 second  hold  GOALS: Goals reviewed with patient? Yes  SHORT TERM GOALS: Target date: 08/22/2022  Pt will be independent with initial HEP for PD specific exercises in order to build upon functional gains made in therapy. Baseline: Goal status: INITIAL  2.  Finish miniBEST with LTG written. Baseline: 22/28 (4/23) Goal status: MET  3.  Pt will improve gait speed with no AD to at least 2.6 ft/sec in order to demo improved community mobility.   Baseline: 13.9 seconds = 2.36 ft/sec Goal status: INITIAL    LONG TERM GOALS: Target date: 09/12/2022   Pt will be independent with final HEP for PD specific exercises in order to build upon functional gains made in therapy. Baseline:  Goal status: INITIAL  2.  Pt will improve MiniBest to 25/28 for decreased fall risk and improvement with compensatory stepping strategies.   Baseline:  22/28 Goal status: REVISED  3.   Pt will improve gait speed with no AD to at least 2.9 ft/sec in order to demo improved community mobility. Baseline: 13.9 seconds = 2.36 ft/sec Goal status: INITIAL  4.  Pt will improve 5x sit<>stand to less than or equal to 15 sec to demonstrate improved functional strength and transfer efficiency.   Baseline: 18.72 seconds with hands on knees Goal status: INITIAL  5.  Pt will verbalize understanding of local Parkinson's disease resources, including options for continue community fitness.  Baseline:  Goal status: INITIAL    ASSESSMENT:  CLINICAL IMPRESSION: Emphasis of skilled PT session on assessing balance via MiniBest and establishing HEP for improved vestibular input. Pt  scored a 22/28 on MiniBest and had most difficulty w/EC, unlevel surfaces, single leg stability and anterior weight shifting. Pt demonstrated powerful and fast stepping strategies as well as no instability w/dynamic gait tasks. Pt reported not taking his Sinemet this morning due to time constraints and was very bothered by R hand tremor throughout session. Established initial HEP for improved vestibular input but difficult to assess pt's balance due to tremor. Encouraged pt to bring his medicine to clinic if needed and can take it here with a snack. Will add to HEP next session to address standing balance, truncal rotation, weight shifting and vestibular function. Continue POC.    OBJECTIVE IMPAIRMENTS: Abnormal gait, decreased activity tolerance, decreased balance, decreased coordination, difficulty walking, decreased strength, hypomobility, impaired flexibility, postural dysfunction, and pain.   ACTIVITY LIMITATIONS: lifting, transfers, and locomotion level  PARTICIPATION LIMITATIONS: community activity  PERSONAL FACTORS: Age, Behavior pattern, Past/current experiences, Time since onset of injury/illness/exacerbation, and 3+ comorbidities: PD (diagnosed ~2020), HTN, A-fib,  arthritis, hx of vertigo, back pain with hx of back surgery (2020, 2023), hx of LLE injury as a kid   are also affecting patient's functional outcome.   REHAB POTENTIAL: Good  CLINICAL DECISION MAKING: Stable/uncomplicated  EVALUATION COMPLEXITY: Moderate  PLAN:  PT FREQUENCY: 2x/week  PT DURATION: 8 weeks - anticipate just 6 weeks  PLANNED INTERVENTIONS: Therapeutic exercises, Therapeutic activity, Neuromuscular re-education, Balance training, Gait training, Patient/Family education, Self Care, Stair training, Vestibular training, DME instructions, Manual therapy, and Re-evaluation  PLAN FOR NEXT SESSION:  Add to HEP for sit <> stands, standing PWR, vestibular input. Anterior weight shifting, standing on unlevel surfaces    Kristopher Attwood E Katty Fretwell, PT, DPT  08/08/2022, 9:31 AM

## 2022-08-10 ENCOUNTER — Other Ambulatory Visit: Payer: Self-pay | Admitting: Nurse Practitioner

## 2022-08-10 ENCOUNTER — Telehealth: Payer: Self-pay | Admitting: Neurology

## 2022-08-10 ENCOUNTER — Other Ambulatory Visit: Payer: Self-pay

## 2022-08-10 DIAGNOSIS — R051 Acute cough: Secondary | ICD-10-CM

## 2022-08-10 DIAGNOSIS — G20A1 Parkinson's disease without dyskinesia, without mention of fluctuations: Secondary | ICD-10-CM

## 2022-08-10 DIAGNOSIS — R0982 Postnasal drip: Secondary | ICD-10-CM

## 2022-08-10 NOTE — Telephone Encounter (Signed)
New message   Patient insurance is not in network with [facility], patient will need to be referral to in network what his insurance provided.

## 2022-08-10 NOTE — Telephone Encounter (Signed)
Called patient to get approval for referral to go to [facility]. Sending to Dr. Arbutus Leas for approval for this change as well

## 2022-08-10 NOTE — Telephone Encounter (Signed)
Focused Ultrasound has been sent to [facility]

## 2022-08-11 ENCOUNTER — Ambulatory Visit: Payer: PPO | Admitting: Physical Therapy

## 2022-08-11 DIAGNOSIS — R29818 Other symptoms and signs involving the nervous system: Secondary | ICD-10-CM

## 2022-08-11 DIAGNOSIS — R293 Abnormal posture: Secondary | ICD-10-CM | POA: Diagnosis not present

## 2022-08-11 DIAGNOSIS — R2689 Other abnormalities of gait and mobility: Secondary | ICD-10-CM

## 2022-08-11 DIAGNOSIS — R2681 Unsteadiness on feet: Secondary | ICD-10-CM

## 2022-08-11 NOTE — Therapy (Signed)
OUTPATIENT PHYSICAL THERAPY NEURO TREATMENT   Patient Name: [name] MRN: [id number] DOB:29-Oct-1945, 77 y.o., male Today's Date: 08/11/2022   PCP:  Joaquim Nam, MD    REFERRING PROVIDER:  Vladimir Faster, DO  END OF SESSION:  PT End of Session - 08/11/22 0804     Visit Number 3    Number of Visits 13    Date for PT Re-Evaluation 09/30/22    Authorization Type [employer]    PT Start Time 0803    PT Stop Time 0844    PT Time Calculation (min) 41 min    Equipment Utilized During Treatment Gait belt    Activity Tolerance Patient tolerated treatment well    Behavior During Therapy WFL for tasks assessed/performed              Past Medical History:  Diagnosis Date   A-fib (HCC)    Arthritis    GERD (gastroesophageal reflux disease)    H/O urinary frequency    Heart murmur    as a child   History of bronchitis    HSV infection    oral   Hypertension    improved after back pain treated with surgery   Insomnia    Insomnia    Improved with Ambien, did not tolerate melatonin.   Joint pain    Parkinson disease    Sleep apnea    no CPAP   Vertigo 10/13/2014   recurrent; trigger is cool air on left ear, improved with daily antihistamine   Past Surgical History:  Procedure Laterality Date   ACHILLES TENDON REPAIR Left    ALLOGRAFT APPLICATION Left 11/12/2020   Procedure: APPLICATION OF  INTEGRA;  Surgeon: Glenna Fellows, MD;  Location: [facility];  Service: Plastics;  Laterality: Left;   BACK SURGERY     COLONOSCOPY  02/2011   Negative,[facility]   DEBRIDEMENT AND CLOSURE WOUND Left 02/25/2018   Procedure: DEBRIDEMENT LEFT LEG APPLICATION INTEGRA;  Surgeon: Glenna Fellows, MD;  Location: [facility];  Service: Plastics;  Laterality: Left;   EYE SURGERY Bilateral    cataracts   INCISION AND DRAINAGE OF WOUND Left 11/12/2020   Procedure: DEBRIDEMENT LLE ULCER;  Surgeon: Glenna Fellows, MD;   Location:  SURGERY CENTER;  Service: Plastics;  Laterality: Left;   KNEE ARTHROSCOPY     L   LUMBAR FUSION     2016   LUMBAR SPINE SURGERY  03/31/2019   exploration of lumbar fusion w/L5-S1 decompression redo L4-5 fusion   SPINE SURGERY  2003   cervical fusion   TONSILLECTOMY     varicoelectomy     Patient Active Problem List   Diagnosis Date Noted   PND (post-nasal drip) 07/19/2022   Chest pain 07/02/2022   Dysuria 07/02/2022   Colitis 06/29/2022   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   Paroxysmal atrial fibrillation (HCC) 07/15/2021   Irritant dermatitis 04/07/2021   Tinea pedis of both feet 04/07/2021   Peripheral edema 04/07/2021   Acute bronchitis 02/28/2021   Other fatigue 01/02/2021   Change in bowel habit 08/06/2020   Healthcare maintenance 03/31/2020   Hypothyroidism 03/31/2020   Dysfunction of eustachian tube 03/10/2020   Constipation 11/19/2019   Insomnia    Allergic rhinitis 07/03/2019   Hyponatremia 05/15/2019   Atrial fibrillation, chronic (HCC) 05/15/2019   Parkinson's disease 05/04/2019   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HCC)    Atrial fibrillation with RVR (HCC) 04/24/2019  History of recent back surgery 04/24/2019   Body mass index (BMI) 25.0-25.9, adult 04/22/2019   Neural foraminal stenosis of lumbosacral spine 03/31/2019   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   Degenerative scoliosis in adult patient 03/20/2019   Other intervertebral disc displacement, lumbosacral region 03/20/2019   Status post lumbar spinal fusion 02/20/2019   Neck pain 02/04/2019   Intermittent tremor 12/09/2018   Acute cough 04/24/2018   Skin lesion 10/31/2017   Lower urinary tract symptoms (LUTS) 04/17/2016   Oral lesion 02/16/2016   Medicare annual wellness visit, initial 12/01/2015   Advance care planning 12/01/2015   Lower back pain 12/01/2015   OSA on CPAP    HTN (hypertension)  04/23/2015   Spondylolisthesis of lumbar region 12/16/2014   GERD (gastroesophageal reflux disease) 10/11/2012   RBBB 06/27/2011    ONSET DATE: 07/27/2022  REFERRING DIAG: G20.A1 (ICD-10-CM) - Parkinson's disease without dyskinesia or fluctuating manifestations   THERAPY DIAG:  Other abnormalities of gait and mobility  Unsteadiness on feet  Abnormal posture  Other symptoms and signs involving the nervous system  Rationale for Evaluation and Treatment: Rehabilitation  SUBJECTIVE:                                                                                                                                                                                             SUBJECTIVE STATEMENT: Has been really busy the past couple of days. [facility] won't accept his [employer] insurance for the focused ultrasound, so now is being referred to [facility]. Took his medication at the start of the session.   Pt accompanied by:  Wife, Burna Mortimer (in lobby)   PERTINENT HISTORY: PMH: PD (diagnosed ~2020), HTN, A-fib, arthritis, hx of vertigo, back pain with hx of back surgery (2020, 2023), hx of LLE injury as a kid   PAIN:  Are you having pain? Yes: NPRS scale: 4/10 Pain location: Low back, sometimes will go to the R side  Pain description: Aching, Sore Aggravating factors: Bending over  Relieving factors: Sitting down, not doing anything    PRECAUTIONS: Fall  WEIGHT BEARING RESTRICTIONS: No  FALLS: Has patient fallen in last 6 months? No  LIVING ENVIRONMENT: Lives with: lives with their spouse Lives in: House/apartment Stairs: Yes: Internal: 12 steps; on right going up and External: 2 steps; none - pt does not have to use the stairs inside for the basement  Has following equipment at home: Walker - 2 wheeled, Grab bars, and walking stick  PLOF: Independent, except unable to tie his shoes (due to stiffness from his back)  PATIENT GOALS: Wants to work on his balance  OBJECTIVE:    COGNITION: Overall cognitive status: Within functional limits for tasks assessed   SENSATION: Pt reports numbness in feet after back surgery   COORDINATION: Heel to shin: WNL bilat   OBSERVATIONS: RUE tremor, resting   POSTURE: rounded shoulders and forward head  LOWER EXTREMITY ROM:     Limited knee extension AROM bilat   LOWER EXTREMITY MMT:    MMT Right Eval Left Eval  Hip flexion 4+ 5  Hip extension    Hip abduction 5 5  Hip adduction 5 5  Hip internal rotation    Hip external rotation    Knee flexion 5 5  Knee extension 4+ 4+  Ankle dorsiflexion 4 4+  Ankle plantarflexion    Ankle inversion    Ankle eversion    (Blank rows = not tested)  All tested in sitting   BED MOBILITY:  Pt reports independence with bed mobility   TRANSFERS: Assistive device utilized: None  Sit to stand: SBA Stand to sit: SBA Pt puts his hands on his thighs, pt with incr forward flexed posture in standing   Pt reports sometimes getting up from the recliner is hard. Pt has difficulty getting off the floor.    GAIT: Gait pattern: step through pattern, decreased arm swing- Right, decreased arm swing- Left, decreased stride length, Right foot flat, Left foot flat, decreased trunk rotation, trunk flexed, and poor foot clearance- Right Distance walked: Clinic distances  Assistive device utilized: None Level of assistance: SBA Comments: Pt holds his arms behind his back or in his pockets due to RUE tremors during gait. Pt reports tremors during gait throws off his balance. Slow when turning with decr step height.     TODAY'S TREATMENT:           Therapeutic Exercise: SciFit with BUE/BLE at Gear 4.0 for 8 minutes for neural priming, reciprocal movement patterns, aerobic warm-up  NMR:  Pt performs PWR! Moves in Standing position    PWR! Up for improved posture x20 reps, cues for opening up hands  PWR! Rock for improved weight-shifting, x10 reps each side, then an additional  10 reps each side with lifting up contralateral leg for dynamic SLS  PWR! Twist for improved trunk rotation x10 reps, pt reporting no back pain with this   PWR! Step for improved step initiation x20 reps - cues for incr step height for SLS  Cues provided for technique, larger amplitude movements and working up towards a 7/10 level of effort   In // bars: On rockerboard in A/P direction for hip/ankle strategy: Weight shifting x10 reps, pt more challenged with this due to RUE tremors and needing intermittent UE support for balance  Self ball toss for perturbations x20 reps, pt performing toss with ball barely leaving his hands Newman Pies toss with other PT, x20 reps, with intermittent UE support as needed and pt tending to lose balance posteriorly, cues to try to utilize hip/ankles before grabbing the bars  On air ex with feet hip width EO: 2 x 10 reps head turns, pt performing with arms crossed with min postural sway    PATIENT EDUCATION: Education details: PWR moves standing for HEP  Person educated: Patient Education method: Programmer, multimedia, Facilities manager, Verbal cues, and Handouts Education comprehension: verbalized understanding and returned demonstration  HOME EXERCISE PROGRAM: Standing PWR moves  Access Code: [email or web address] URL: [email or web address] Date: 08/08/2022 Prepared by: Alethia Berthold Plaster  Exercises - Corner Balance Feet Together With Eyes Closed  - 1 x daily - 7 x weekly -  3-4 reps - 30-45 second hold - Standing with eyes closed on old pillows/dog bed/blankets with head turns  - 1 x daily - 7 x weekly - 3-4 reps - 30-45 second  hold  GOALS: Goals reviewed with patient? Yes  SHORT TERM GOALS: Target date: 08/22/2022  Pt will be independent with initial HEP for PD specific exercises in order to build upon functional gains made in therapy. Baseline: Goal status: INITIAL  2.  Finish miniBEST with LTG written. Baseline: 22/28 (4/23) Goal status: MET  3.  Pt will  improve gait speed with no AD to at least 2.6 ft/sec in order to demo improved community mobility.   Baseline: 13.9 seconds = 2.36 ft/sec Goal status: INITIAL    LONG TERM GOALS: Target date: 09/12/2022   Pt will be independent with final HEP for PD specific exercises in order to build upon functional gains made in therapy. Baseline:  Goal status: INITIAL  2.  Pt will improve MiniBest to 25/28 for decreased fall risk and improvement with compensatory stepping strategies.   Baseline: 22/28 Goal status: REVISED  3.   Pt will improve gait speed with no AD to at least 2.9 ft/sec in order to demo improved community mobility. Baseline: 13.9 seconds = 2.36 ft/sec Goal status: INITIAL  4.  Pt will improve 5x sit<>stand to less than or equal to 15 sec to demonstrate improved functional strength and transfer efficiency.   Baseline: 18.72 seconds with hands on knees Goal status: INITIAL  5.  Pt will verbalize understanding of local Parkinson's disease resources, including options for continue community fitness.  Baseline:  Goal status: INITIAL    ASSESSMENT:  CLINICAL IMPRESSION: Today's skilled session focused on SciFit for neural priming, standing PWR moves for balance/large amplitude movement patterns and balance on compliant surfaces for hip/ankle strategy. Pt tolerated standing PWR moves well without reports of back pain. Was more challenged by SLS activities. Pt took his Sinemet at the start of his session today, but was still challenged by his R hand tremor with more static balance tasks without manual dual task. Continue POC.    OBJECTIVE IMPAIRMENTS: Abnormal gait, decreased activity tolerance, decreased balance, decreased coordination, difficulty walking, decreased strength, hypomobility, impaired flexibility, postural dysfunction, and pain.   ACTIVITY LIMITATIONS: lifting, transfers, and locomotion level  PARTICIPATION LIMITATIONS: community activity  PERSONAL FACTORS:  Age, Behavior pattern, Past/current experiences, Time since onset of injury/illness/exacerbation, and 3+ comorbidities: PD (diagnosed ~2020), HTN, A-fib, arthritis, hx of vertigo, back pain with hx of back surgery (2020, 2023), hx of LLE injury as a kid   are also affecting patient's functional outcome.   REHAB POTENTIAL: Good  CLINICAL DECISION MAKING: Stable/uncomplicated  EVALUATION COMPLEXITY: Moderate  PLAN:  PT FREQUENCY: 2x/week  PT DURATION: 8 weeks - anticipate just 6 weeks  PLANNED INTERVENTIONS: Therapeutic exercises, Therapeutic activity, Neuromuscular re-education, Balance training, Gait training, Patient/Family education, Self Care, Stair training, Vestibular training, DME instructions, Manual therapy, and Re-evaluation  PLAN FOR NEXT SESSION:  work on balance for vestibular input. Anterior weight shifting, standing on unlevel surfaces. Work on floor transfers   Drake Leach, PT, DPT  08/11/2022, 8:56 AM

## 2022-08-13 NOTE — Telephone Encounter (Signed)
Sent. Thanks.   

## 2022-08-15 ENCOUNTER — Ambulatory Visit: Payer: PPO | Admitting: Physical Therapy

## 2022-08-15 ENCOUNTER — Other Ambulatory Visit: Payer: Self-pay | Admitting: Neurology

## 2022-08-15 DIAGNOSIS — R2681 Unsteadiness on feet: Secondary | ICD-10-CM

## 2022-08-15 DIAGNOSIS — G20A1 Parkinson's disease without dyskinesia, without mention of fluctuations: Secondary | ICD-10-CM

## 2022-08-15 DIAGNOSIS — R293 Abnormal posture: Secondary | ICD-10-CM

## 2022-08-15 DIAGNOSIS — R2689 Other abnormalities of gait and mobility: Secondary | ICD-10-CM

## 2022-08-15 NOTE — Therapy (Signed)
OUTPATIENT PHYSICAL THERAPY NEURO TREATMENT   Patient Name: [name] MRN: [id number] DOB:10/25/1945, 77 y.o., male Today's Date: 08/15/2022   PCP:  Joaquim Nam, MD   REFERRING PROVIDER:  Vladimir Faster, DO  END OF SESSION:  PT End of Session - 08/15/22 0851     Visit Number 4    Number of Visits 13    Date for PT Re-Evaluation 09/30/22    Authorization Type [employer]    PT Start Time 0848    PT Stop Time 0933    PT Time Calculation (min) 45 min    Equipment Utilized During Treatment Gait belt    Activity Tolerance Patient tolerated treatment well    Behavior During Therapy WFL for tasks assessed/performed               Past Medical History:  Diagnosis Date   A-fib (HCC)    Arthritis    GERD (gastroesophageal reflux disease)    H/O urinary frequency    Heart murmur    as a child   History of bronchitis    HSV infection    oral   Hypertension    improved after back pain treated with surgery   Insomnia    Insomnia    Improved with Ambien, did not tolerate melatonin.   Joint pain    Parkinson disease    Sleep apnea    no CPAP   Vertigo 10/13/2014   recurrent; trigger is cool air on left ear, improved with daily antihistamine   Past Surgical History:  Procedure Laterality Date   ACHILLES TENDON REPAIR Left    ALLOGRAFT APPLICATION Left 11/12/2020   Procedure: APPLICATION OF  INTEGRA;  Surgeon: Glenna Fellows, MD;  Location: [facility];  Service: Plastics;  Laterality: Left;   BACK SURGERY     COLONOSCOPY  02/2011   Negative,[facility]   DEBRIDEMENT AND CLOSURE WOUND Left 02/25/2018   Procedure: DEBRIDEMENT LEFT LEG APPLICATION INTEGRA;  Surgeon: Glenna Fellows, MD;  Location: [facility];  Service: Plastics;  Laterality: Left;   EYE SURGERY Bilateral    cataracts   INCISION AND DRAINAGE OF WOUND Left 11/12/2020   Procedure: DEBRIDEMENT LLE ULCER;  Surgeon: Glenna Fellows, MD;   Location:  SURGERY CENTER;  Service: Plastics;  Laterality: Left;   KNEE ARTHROSCOPY     L   LUMBAR FUSION     2016   LUMBAR SPINE SURGERY  03/31/2019   exploration of lumbar fusion w/L5-S1 decompression redo L4-5 fusion   SPINE SURGERY  2003   cervical fusion   TONSILLECTOMY     varicoelectomy     Patient Active Problem List   Diagnosis Date Noted   PND (post-nasal drip) 07/19/2022   Chest pain 07/02/2022   Dysuria 07/02/2022   Colitis 06/29/2022   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   Paroxysmal atrial fibrillation (HCC) 07/15/2021   Irritant dermatitis 04/07/2021   Tinea pedis of both feet 04/07/2021   Peripheral edema 04/07/2021   Acute bronchitis 02/28/2021   Other fatigue 01/02/2021   Change in bowel habit 08/06/2020   Healthcare maintenance 03/31/2020   Hypothyroidism 03/31/2020   Dysfunction of eustachian tube 03/10/2020   Constipation 11/19/2019   Insomnia    Allergic rhinitis 07/03/2019   Hyponatremia 05/15/2019   Atrial fibrillation, chronic (HCC) 05/15/2019   Parkinson's disease 05/04/2019   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HCC)    Atrial fibrillation with RVR (HCC) 04/24/2019  History of recent back surgery 04/24/2019   Body mass index (BMI) 25.0-25.9, adult 04/22/2019   Neural foraminal stenosis of lumbosacral spine 03/31/2019   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   Degenerative scoliosis in adult patient 03/20/2019   Other intervertebral disc displacement, lumbosacral region 03/20/2019   Status post lumbar spinal fusion 02/20/2019   Neck pain 02/04/2019   Intermittent tremor 12/09/2018   Acute cough 04/24/2018   Skin lesion 10/31/2017   Lower urinary tract symptoms (LUTS) 04/17/2016   Oral lesion 02/16/2016   Medicare annual wellness visit, initial 12/01/2015   Advance care planning 12/01/2015   Lower back pain 12/01/2015   OSA on CPAP    HTN (hypertension)  04/23/2015   Spondylolisthesis of lumbar region 12/16/2014   GERD (gastroesophageal reflux disease) 10/11/2012   RBBB 06/27/2011    ONSET DATE: 07/27/2022  REFERRING DIAG: G20.A1 (ICD-10-CM) - Parkinson's disease without dyskinesia or fluctuating manifestations   THERAPY DIAG:  Other abnormalities of gait and mobility  Unsteadiness on feet  Abnormal posture  Rationale for Evaluation and Treatment: Rehabilitation  SUBJECTIVE:                                                                                                                                                                                             SUBJECTIVE STATEMENT: Pt reports doing well, stayed busy this weekend and has been incorporating his PWR moves into his day to day life. Figured out he can stand w/EC if he puts his hands on his hips. No falls.   Pt accompanied by:  Wife, Burna Mortimer (in lobby)   PERTINENT HISTORY: PMH: PD (diagnosed ~2020), HTN, A-fib, arthritis, hx of vertigo, back pain with hx of back surgery (2020, 2023), hx of LLE injury as a kid   PAIN:  Are you having pain? Yes: NPRS scale: 5-6/10 Pain location: Low back, sometimes will go to the R side  Pain description: Aching, Sore Aggravating factors: Bending over  Relieving factors: Sitting down, not doing anything    PRECAUTIONS: Fall  WEIGHT BEARING RESTRICTIONS: No  FALLS: Has patient fallen in last 6 months? No  LIVING ENVIRONMENT: Lives with: lives with their spouse Lives in: House/apartment Stairs: Yes: Internal: 12 steps; on right going up and External: 2 steps; none - pt does not have to use the stairs inside for the basement  Has following equipment at home: Walker - 2 wheeled, Grab bars, and walking stick  PLOF: Independent, except unable to tie his shoes (due to stiffness from his back)  PATIENT GOALS: Wants to work on his balance   OBJECTIVE:   COGNITION: Overall cognitive status:  Within functional limits for tasks  assessed   SENSATION: Pt reports numbness in feet after back surgery   COORDINATION: Heel to shin: WNL bilat   OBSERVATIONS: RUE tremor, resting   POSTURE: rounded shoulders and forward head  LOWER EXTREMITY ROM:     Limited knee extension AROM bilat   LOWER EXTREMITY MMT:    MMT Right Eval Left Eval  Hip flexion 4+ 5  Hip extension    Hip abduction 5 5  Hip adduction 5 5  Hip internal rotation    Hip external rotation    Knee flexion 5 5  Knee extension 4+ 4+  Ankle dorsiflexion 4 4+  Ankle plantarflexion    Ankle inversion    Ankle eversion    (Blank rows = not tested)  All tested in sitting   BED MOBILITY:  Pt reports independence with bed mobility   TRANSFERS: Assistive device utilized: None  Sit to stand: SBA Stand to sit: SBA Pt puts his hands on his thighs, pt with incr forward flexed posture in standing   Pt reports sometimes getting up from the recliner is hard. Pt has difficulty getting off the floor.    GAIT: Gait pattern: step through pattern, decreased arm swing- Right, decreased arm swing- Left, decreased stride length, Right foot flat, Left foot flat, decreased trunk rotation, trunk flexed, and poor foot clearance- Right Distance walked: Clinic distances  Assistive device utilized: None Level of assistance: SBA Comments: Pt holds his arms behind his back or in his pockets due to RUE tremors during gait. Pt reports tremors during gait throws off his balance. Slow when turning with decr step height.     TODAY'S TREATMENT:          Ther Act  Pt inquiring about potential side effects of focused ultrasound and whether he should have DBS instead. Reviewed common side effects of both but ultimately encouraged pt to discuss this with [facility] when he has his consultation. Pt worried about potential effects on speech w/ultrasound, as he is a Optician, dispensing.   NMR  On ramp, standing w/EC and hands on hips, 2x30s holds. Compared to standing w/arms at side, pt  much more stable w/hands on hips but did note very wide BOS. Challenged pt to stand w/feet together and pt demonstrated moderate A/P sway even w/hands on hips. Encouraged pt to practice this at home for continued balance challenge, pt verbalized understanding.  At mirror while standing on [employer], 6 blaze pods on random reach setting on 2 minute intervals for improved lateral reaching, dual-tasking, ankle strategy and dynamic balance: Round 1: 52 hits. Noted very wide BOS to stabilize and pt required min cues to maintain R foot on foam  Round 2: 40 hits w/cues to cross-body tap only. Continued cues to keep R foot on foam  Round 3: 21 hits w/cues to cross-body tap and adding cog dual-task (naming animals when pod is red and food when pod is blue). Pt had significant difficulty w/this and repeated the same two nouns (ice cream and bird) throughout.    PATIENT EDUCATION: Education details: Continue HEP, working on [employer] exercises w/more narrow BOS  Person educated: Patient Education method: Programmer, multimedia, Facilities manager, Verbal cues, and Handouts Education comprehension: verbalized understanding and returned demonstration  HOME EXERCISE PROGRAM: Standing PWR moves  Access Code: [email or web address] URL: [email or web address] Date: 08/08/2022 Prepared by: Alethia Berthold Teyanna Thielman  Exercises - Corner Balance Feet Together With Eyes Closed  - 1 x daily - 7 x weekly - 3-4 reps - 30-45  second hold - Standing with eyes closed on old pillows/dog bed/blankets with head turns  - 1 x daily - 7 x weekly - 3-4 reps - 30-45 second  hold  GOALS: Goals reviewed with patient? Yes  SHORT TERM GOALS: Target date: 08/22/2022  Pt will be independent with initial HEP for PD specific exercises in order to build upon functional gains made in therapy. Baseline: Goal status: INITIAL  2.  Finish miniBEST with LTG written. Baseline: 22/28 (4/23) Goal status: MET  3.  Pt will improve gait speed with no AD to at least 2.6 ft/sec  in order to demo improved community mobility.   Baseline: 13.9 seconds = 2.36 ft/sec Goal status: INITIAL    LONG TERM GOALS: Target date: 09/12/2022   Pt will be independent with final HEP for PD specific exercises in order to build upon functional gains made in therapy. Baseline:  Goal status: INITIAL  2.  Pt will improve MiniBest to 25/28 for decreased fall risk and improvement with compensatory stepping strategies.   Baseline: 22/28 Goal status: REVISED  3.   Pt will improve gait speed with no AD to at least 2.9 ft/sec in order to demo improved community mobility. Baseline: 13.9 seconds = 2.36 ft/sec Goal status: INITIAL  4.  Pt will improve 5x sit<>stand to less than or equal to 15 sec to demonstrate improved functional strength and transfer efficiency.   Baseline: 18.72 seconds with hands on knees Goal status: INITIAL  5.  Pt will verbalize understanding of local Parkinson's disease resources, including options for continue community fitness.  Baseline:  Goal status: INITIAL    ASSESSMENT:  CLINICAL IMPRESSION: Emphasis of skilled PT session on pt education on DBS vs focused ultrasound, improved vestibular input and anticipatory balance strategies. Pt unsure if he would be better off w/focused ultrasound or DBS and is worried about potential side effects of the ultrasound. Encouraged pt to discuss this at [facility] consultation. Pt more challenged by balance tasks w/narrow BOS or dual-task component but has figured out he can stabilize more effectively if his hands are on his hips. Pt will continue to benefit from skilled PT services to work on dynamic balance tasks and reduced visual reliance. Continue POC.    OBJECTIVE IMPAIRMENTS: Abnormal gait, decreased activity tolerance, decreased balance, decreased coordination, difficulty walking, decreased strength, hypomobility, impaired flexibility, postural dysfunction, and pain.   ACTIVITY LIMITATIONS: lifting, transfers, and  locomotion level  PARTICIPATION LIMITATIONS: community activity  PERSONAL FACTORS: Age, Behavior pattern, Past/current experiences, Time since onset of injury/illness/exacerbation, and 3+ comorbidities: PD (diagnosed ~2020), HTN, A-fib, arthritis, hx of vertigo, back pain with hx of back surgery (2020, 2023), hx of LLE injury as a kid   are also affecting patient's functional outcome.   REHAB POTENTIAL: Good  CLINICAL DECISION MAKING: Stable/uncomplicated  EVALUATION COMPLEXITY: Moderate  PLAN:  PT FREQUENCY: 2x/week  PT DURATION: 8 weeks - anticipate just 6 weeks  PLANNED INTERVENTIONS: Therapeutic exercises, Therapeutic activity, Neuromuscular re-education, Balance training, Gait training, Patient/Family education, Self Care, Stair training, Vestibular training, DME instructions, Manual therapy, and Re-evaluation  PLAN FOR NEXT SESSION:  work on balance for vestibular input. Anterior weight shifting, standing on unlevel surfaces. Work on floor transfers   [employer], PT, DPT  08/15/2022, 9:34 AM

## 2022-08-17 ENCOUNTER — Ambulatory Visit: Payer: PPO | Admitting: Rheumatology

## 2022-08-17 DIAGNOSIS — I10 Essential (primary) hypertension: Secondary | ICD-10-CM

## 2022-08-17 DIAGNOSIS — M19041 Primary osteoarthritis, right hand: Secondary | ICD-10-CM

## 2022-08-17 DIAGNOSIS — J301 Allergic rhinitis due to pollen: Secondary | ICD-10-CM

## 2022-08-17 DIAGNOSIS — M2550 Pain in unspecified joint: Secondary | ICD-10-CM

## 2022-08-17 DIAGNOSIS — I4510 Unspecified right bundle-branch block: Secondary | ICD-10-CM

## 2022-08-17 DIAGNOSIS — M65332 Trigger finger, left middle finger: Secondary | ICD-10-CM

## 2022-08-17 DIAGNOSIS — Z8669 Personal history of other diseases of the nervous system and sense organs: Secondary | ICD-10-CM

## 2022-08-17 DIAGNOSIS — M160 Bilateral primary osteoarthritis of hip: Secondary | ICD-10-CM

## 2022-08-17 DIAGNOSIS — M5136 Other intervertebral disc degeneration, lumbar region: Secondary | ICD-10-CM

## 2022-08-17 DIAGNOSIS — I480 Paroxysmal atrial fibrillation: Secondary | ICD-10-CM

## 2022-08-17 DIAGNOSIS — M5030 Other cervical disc degeneration, unspecified cervical region: Secondary | ICD-10-CM

## 2022-08-17 DIAGNOSIS — K219 Gastro-esophageal reflux disease without esophagitis: Secondary | ICD-10-CM

## 2022-08-17 DIAGNOSIS — M11262 Other chondrocalcinosis, left knee: Secondary | ICD-10-CM

## 2022-08-17 DIAGNOSIS — Z8639 Personal history of other endocrine, nutritional and metabolic disease: Secondary | ICD-10-CM

## 2022-08-17 DIAGNOSIS — M24529 Contracture, unspecified elbow: Secondary | ICD-10-CM

## 2022-08-17 DIAGNOSIS — M170 Bilateral primary osteoarthritis of knee: Secondary | ICD-10-CM

## 2022-08-18 ENCOUNTER — Ambulatory Visit: Payer: PPO | Attending: Neurology | Admitting: Physical Therapy

## 2022-08-18 ENCOUNTER — Encounter: Payer: Self-pay | Admitting: Physical Therapy

## 2022-08-18 ENCOUNTER — Ambulatory Visit (INDEPENDENT_AMBULATORY_CARE_PROVIDER_SITE_OTHER): Payer: PPO | Admitting: Family Medicine

## 2022-08-18 ENCOUNTER — Encounter: Payer: Self-pay | Admitting: Family Medicine

## 2022-08-18 ENCOUNTER — Ambulatory Visit (INDEPENDENT_AMBULATORY_CARE_PROVIDER_SITE_OTHER)
Admission: RE | Admit: 2022-08-18 | Discharge: 2022-08-18 | Disposition: A | Discharge status: Home / Self Care | Payer: PPO | Source: Ambulatory Visit | Attending: Family Medicine | Admitting: Family Medicine

## 2022-08-18 VITALS — BP 136/83 | HR 70

## 2022-08-18 VITALS — BP 130/80 | HR 88 | Temp 98.2°F | Ht 75.0 in | Wt 205.0 lb

## 2022-08-18 DIAGNOSIS — R2689 Other abnormalities of gait and mobility: Secondary | ICD-10-CM | POA: Insufficient documentation

## 2022-08-18 DIAGNOSIS — R293 Abnormal posture: Secondary | ICD-10-CM | POA: Diagnosis not present

## 2022-08-18 DIAGNOSIS — R2681 Unsteadiness on feet: Secondary | ICD-10-CM | POA: Insufficient documentation

## 2022-08-18 DIAGNOSIS — R059 Cough, unspecified: Secondary | ICD-10-CM | POA: Diagnosis not present

## 2022-08-18 DIAGNOSIS — R29818 Other symptoms and signs involving the nervous system: Secondary | ICD-10-CM | POA: Insufficient documentation

## 2022-08-18 MED ORDER — PREDNISONE 10 MG PO TABS: ORAL_TABLET | ORAL | 0 refills | Status: DC

## 2022-08-18 NOTE — Progress Notes (Unsigned)
Cough. X2 months. Patient states he feels like he has a tickle in his throat. Cough is mostly dry but sometimes can get clear mucus up. Some occ sputum at night.  Feels like he needs to clear his throat.  Tried nasal saline.  No wheeze.  No fevers.  No dysphagia except with large dry pills.  No heartburn.  Taking omeprazole.  He thought his sx were related to allergies.  No help with zyrtec. Already on flonase.  No weight loss, night sweats, etc.  No antecedent illness.   Meds, vitals, and allergies reviewed.   ROS: Per HPI unless specifically indicated in ROS section   GEN: nad, alert and oriented HEENT: ncat, OP wnl, nasal exam slightly stuffy. NECK: supple w/o LA CV: rrr. PULM: ctab, no inc wob ABD: soft, +bs EXT: no edema SKIN: Well-perfused.

## 2022-08-18 NOTE — Therapy (Signed)
OUTPATIENT PHYSICAL THERAPY NEURO TREATMENT   Patient Name: [name] MRN: [id number] DOB:1945-10-20, 77 y.o., male Today's Date: 08/18/2022   PCP:  Joaquim Nam, MD   REFERRING PROVIDER:  Vladimir Faster, DO  END OF SESSION:  PT End of Session - 08/18/22 0801     Visit Number 5    Number of Visits 13    Date for PT Re-Evaluation 09/30/22    Authorization Type [employer]    PT Start Time 0800    PT Stop Time 0844    PT Time Calculation (min) 44 min    Equipment Utilized During Treatment Gait belt    Activity Tolerance Patient tolerated treatment well    Behavior During Therapy WFL for tasks assessed/performed               Past Medical History:  Diagnosis Date   A-fib (HCC)    Arthritis    GERD (gastroesophageal reflux disease)    H/O urinary frequency    Heart murmur    as a child   History of bronchitis    HSV infection    oral   Hypertension    improved after back pain treated with surgery   Insomnia    Insomnia    Improved with Ambien, did not tolerate melatonin.   Joint pain    Parkinson disease    Sleep apnea    no CPAP   Vertigo 10/13/2014   recurrent; trigger is cool air on left ear, improved with daily antihistamine   Past Surgical History:  Procedure Laterality Date   ACHILLES TENDON REPAIR Left    ALLOGRAFT APPLICATION Left 11/12/2020   Procedure: APPLICATION OF  INTEGRA;  Surgeon: Glenna Fellows, MD;  Location: [facility];  Service: Plastics;  Laterality: Left;   BACK SURGERY     COLONOSCOPY  02/2011   Negative,[facility]   DEBRIDEMENT AND CLOSURE WOUND Left 02/25/2018   Procedure: DEBRIDEMENT LEFT LEG APPLICATION INTEGRA;  Surgeon: Glenna Fellows, MD;  Location: [facility];  Service: Plastics;  Laterality: Left;   EYE SURGERY Bilateral    cataracts   INCISION AND DRAINAGE OF WOUND Left 11/12/2020   Procedure: DEBRIDEMENT LLE ULCER;  Surgeon: Glenna Fellows, MD;   Location: [facility];  Service: Plastics;  Laterality: Left;   KNEE ARTHROSCOPY     L   LUMBAR FUSION     2016   LUMBAR SPINE SURGERY  03/31/2019   exploration of lumbar fusion w/L5-S1 decompression redo L4-5 fusion   SPINE SURGERY  2003   cervical fusion   TONSILLECTOMY     varicoelectomy     Patient Active Problem List   Diagnosis Date Noted   PND (post-nasal drip) 07/19/2022   Chest pain 07/02/2022   Dysuria 07/02/2022   Colitis 06/29/2022   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   Paroxysmal atrial fibrillation (HCC) 07/15/2021   Irritant dermatitis 04/07/2021   Tinea pedis of both feet 04/07/2021   Peripheral edema 04/07/2021   Acute bronchitis 02/28/2021   Other fatigue 01/02/2021   Change in bowel habit 08/06/2020   Healthcare maintenance 03/31/2020   Hypothyroidism 03/31/2020   Dysfunction of eustachian tube 03/10/2020   Constipation 11/19/2019   Insomnia    Allergic rhinitis 07/03/2019   Hyponatremia 05/15/2019   Atrial fibrillation, chronic (HCC) 05/15/2019   Parkinson's disease 05/04/2019   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HCC)    Atrial fibrillation with RVR (HCC) 04/24/2019  History of recent back surgery 04/24/2019   Body mass index (BMI) 25.0-25.9, adult 04/22/2019   Neural foraminal stenosis of lumbosacral spine 03/31/2019   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   Degenerative scoliosis in adult patient 03/20/2019   Other intervertebral disc displacement, lumbosacral region 03/20/2019   Status post lumbar spinal fusion 02/20/2019   Neck pain 02/04/2019   Intermittent tremor 12/09/2018   Acute cough 04/24/2018   Skin lesion 10/31/2017   Lower urinary tract symptoms (LUTS) 04/17/2016   Oral lesion 02/16/2016   Medicare annual wellness visit, initial 12/01/2015   Advance care planning 12/01/2015   Lower back pain 12/01/2015   OSA on CPAP    HTN (hypertension)  04/23/2015   Spondylolisthesis of lumbar region 12/16/2014   GERD (gastroesophageal reflux disease) 10/11/2012   RBBB 06/27/2011    ONSET DATE: 07/27/2022  REFERRING DIAG: G20.A1 (ICD-10-CM) - Parkinson's disease without dyskinesia or fluctuating manifestations   THERAPY DIAG:  Other symptoms and signs involving the nervous system  Other abnormalities of gait and mobility  Unsteadiness on feet  Abnormal posture  Rationale for Evaluation and Treatment: Rehabilitation  SUBJECTIVE:                                                                                                                                                                                             SUBJECTIVE STATEMENT: Pt reports that he is doing well. Has appointment regarding focused ultrasound scheduled for June 19th per patient in [place]. Patient reports greatest frustration with tremor.   Pt accompanied by:  Wife, [name] (in lobby)   PERTINENT HISTORY: PMH: PD (diagnosed ~2020), HTN, A-fib, arthritis, hx of vertigo, back pain with hx of back surgery (2020, 2023), hx of LLE injury as a kid   PAIN:  Are you having pain? Yes: NPRS scale: 5-6/10 Pain location: Low back, sometimes will go to the R side  Pain description: Aching, Sore Aggravating factors: Bending over  Relieving factors: Sitting down, not doing anything    PRECAUTIONS: Fall  WEIGHT BEARING RESTRICTIONS: No  FALLS: Has patient fallen in last 6 months? No  LIVING ENVIRONMENT: Lives with: lives with their spouse Lives in: House/apartment Stairs: Yes: Internal: 12 steps; on right going up and External: 2 steps; none - pt does not have to use the stairs inside for the basement  Has following equipment at home: Walker - 2 wheeled, Grab bars, and walking stick  PLOF: Independent, except unable to tie his shoes (due to stiffness from his back)  PATIENT GOALS: Wants to work on his balance   OBJECTIVE:   COGNITION: Overall cognitive  status: Within  functional limits for tasks assessed   SENSATION: Pt reports numbness in feet after back surgery   COORDINATION: Heel to shin: WNL bilat   OBSERVATIONS: RUE tremor, resting   POSTURE: rounded shoulders and forward head  LOWER EXTREMITY ROM:     Limited knee extension AROM bilat   LOWER EXTREMITY MMT:    MMT Right Eval Left Eval  Hip flexion 4+ 5  Hip extension    Hip abduction 5 5  Hip adduction 5 5  Hip internal rotation    Hip external rotation    Knee flexion 5 5  Knee extension 4+ 4+  Ankle dorsiflexion 4 4+  Ankle plantarflexion    Ankle inversion    Ankle eversion    (Blank rows = not tested)  All tested in sitting   BED MOBILITY:  Pt reports independence with bed mobility   TRANSFERS: Assistive device utilized: None  Sit to stand: SBA Stand to sit: SBA Pt puts his hands on his thighs, pt with incr forward flexed posture in standing   Pt reports sometimes getting up from the recliner is hard. Pt has difficulty getting off the floor.    GAIT: Gait pattern: step through pattern, decreased arm swing- Right, decreased arm swing- Left, decreased stride length, Right foot flat, Left foot flat, decreased trunk rotation, trunk flexed, and poor foot clearance- Right Distance walked: Clinic distances  Assistive device utilized: None Level of assistance: SBA Comments: Pt holds his arms behind his back or in his pockets due to RUE tremors during gait. Pt reports tremors during gait throws off his balance. Slow when turning with decr step height.   TODAY'S TREATMENT:           NMR:   Large Amplitude Warmup with red theraband pull apart for stepping reaction between // bars (CGA, minA 1-3 times due to posterior/anterior LOB) Fwd step return bilateral alt stepping onto airex standing on firm 1 x 10 Backwd step return bilateral alt stepping off airex onto firm 1 x 10 Lateral step return bil alt stepping off airex onto firm 1 x 10 Lateral step  return bil alt stepping off airex onto firm with lateral twist D2 cross pattern with 2lb ball instead of theraband 1 x 8  Standing on double blue foam with CGA-minA with ball toss Self selected normal BOS x 10 tosses straight on, x 10 multidirection NBOS x 10 tosses straight on, x 10 multidirection (increased difficulty with posterior LOB)  Outdoor gait overgrass to simulate patient working in yard (CGA) Fwd backward gait without pertubations 1 x 20' Fwd / backward gait with max pertubations 2 x 20' (patient able to demonstrate appropriate stepping strategies and recover balance with all attempts, use of external cue to increase outward gaze and upright posture)  Rockerboard in anterior posterior direction between // bars: EO NBOS with mini pertubations with blue theraband mix of CGA-minA and reliance on UE on // bars to help correct intermittently 4 x 5-15" EO NBOS without pertubations 2 x 20-30" (SBA) EC NBOS without pertubations 3 x 5-10" (SBA-minA)  TherAct:  Fall recovery on red mat with single UE support with RLE up in half kneel to come to stand (CGA-SBA) performed x 2 patient had increased difficulty advancing LE from tall knee to half kneel (RLE > LLE) so practiced this advancement with external target 2 x 10  PATIENT EDUCATION: Education details: Continue HEP, reason for POC in today's session Person educated: Patient Education method: Explanation, Demonstration, and Verbal cues  Education comprehension: verbalized understanding and returned demonstration  HOME EXERCISE PROGRAM: Standing PWR moves  Access Code: [email or web address] URL: [email or web address] Date: 08/08/2022 Prepared by: Alethia Berthold Plaster  Exercises - Corner Balance Feet Together With Eyes Closed  - 1 x daily - 7 x weekly - 3-4 reps - 30-45 second hold - Standing with eyes closed on old pillows/dog bed/blankets with head turns  - 1 x daily - 7 x weekly - 3-4 reps - 30-45 second  hold  GOALS: Goals  reviewed with patient? Yes  SHORT TERM GOALS: Target date: 08/22/2022  Pt will be independent with initial HEP for PD specific exercises in order to build upon functional gains made in therapy. Baseline: Goal status: INITIAL  2.  Finish miniBEST with LTG written. Baseline: 22/28 (4/23) Goal status: MET  3.  Pt will improve gait speed with no AD to at least 2.6 ft/sec in order to demo improved community mobility.   Baseline: 13.9 seconds = 2.36 ft/sec Goal status: INITIAL    LONG TERM GOALS: Target date: 09/12/2022   Pt will be independent with final HEP for PD specific exercises in order to build upon functional gains made in therapy. Baseline:  Goal status: INITIAL  2.  Pt will improve MiniBest to 25/28 for decreased fall risk and improvement with compensatory stepping strategies.   Baseline: 22/28 Goal status: REVISED  3.   Pt will improve gait speed with no AD to at least 2.9 ft/sec in order to demo improved community mobility. Baseline: 13.9 seconds = 2.36 ft/sec Goal status: INITIAL  4.  Pt will improve 5x sit<>stand to less than or equal to 15 sec to demonstrate improved functional strength and transfer efficiency.   Baseline: 18.72 seconds with hands on knees Goal status: INITIAL  5.  Pt will verbalize understanding of local Parkinson's disease resources, including options for continue community fitness.  Baseline:  Goal status: INITIAL    ASSESSMENT:  CLINICAL IMPRESSION: Emphasis of skilled PT session on stepping strategy for reactive balance on compliant surface with carryover practice in outdoor environment with perturbations. Also, emphasized fall recovery and strategies of limb advancement from tall kneel to half kneel. Patient had greatest challenge with EC balance tasks on rocker board with poor anterior/posterior balance control. Will continue to emphasize in future sessions. Benefited from use of hands on hips to help reduce tremor. Continue POC.    OBJECTIVE IMPAIRMENTS: Abnormal gait, decreased activity tolerance, decreased balance, decreased coordination, difficulty walking, decreased strength, hypomobility, impaired flexibility, postural dysfunction, and pain.   ACTIVITY LIMITATIONS: lifting, transfers, and locomotion level  PARTICIPATION LIMITATIONS: community activity  PERSONAL FACTORS: Age, Behavior pattern, Past/current experiences, Time since onset of injury/illness/exacerbation, and 3+ comorbidities: PD (diagnosed ~2020), HTN, A-fib, arthritis, hx of vertigo, back pain with hx of back surgery (2020, 2023), hx of LLE injury as a kid   are also affecting patient's functional outcome.   REHAB POTENTIAL: Good  CLINICAL DECISION MAKING: Stable/uncomplicated  EVALUATION COMPLEXITY: Moderate  PLAN:  PT FREQUENCY: 2x/week  PT DURATION: 8 weeks - anticipate just 6 weeks  PLANNED INTERVENTIONS: Therapeutic exercises, Therapeutic activity, Neuromuscular re-education, Balance training, Gait training, Patient/Family education, Self Care, Stair training, Vestibular training, DME instructions, Manual therapy, and Re-evaluation  PLAN FOR NEXT SESSION:  work on balance for vestibular input. Anterior weight shifting, standing on unlevel surfaces, continue fall recovery strategies with tips to advance RLE from tall kneel to half kneel,  possibly trial river rocks with balance tasks on mat to increase compliance challenge  Carmelia Bake, PT, DPT  08/18/2022, 9:14 AM

## 2022-08-18 NOTE — Patient Instructions (Signed)
Xray on the way out.  Start prednisone.  Update me if not better.  Take care.  Glad to see you.

## 2022-08-20 DIAGNOSIS — R059 Cough, unspecified: Secondary | ICD-10-CM | POA: Insufficient documentation

## 2022-08-20 NOTE — Assessment & Plan Note (Signed)
Differential diagnosis for chronic cough discussed with patient. Chest x-ray today. Could be due to environmental allergies in spite of nasal saline Zyrtec and Flonase. Start prednisone.  Steroid cautions discussed with patient.  He agrees with plan. Update me if not better.

## 2022-08-22 ENCOUNTER — Ambulatory Visit: Payer: PPO | Admitting: Physical Therapy

## 2022-08-22 ENCOUNTER — Encounter: Payer: Self-pay | Admitting: Physical Therapy

## 2022-08-22 DIAGNOSIS — R2681 Unsteadiness on feet: Secondary | ICD-10-CM

## 2022-08-22 DIAGNOSIS — R293 Abnormal posture: Secondary | ICD-10-CM

## 2022-08-22 DIAGNOSIS — R2689 Other abnormalities of gait and mobility: Secondary | ICD-10-CM

## 2022-08-22 DIAGNOSIS — R29818 Other symptoms and signs involving the nervous system: Secondary | ICD-10-CM

## 2022-08-22 NOTE — Therapy (Signed)
OUTPATIENT PHYSICAL THERAPY NEURO TREATMENT   Patient Name: [name] MRN: [id number] DOB:04/21/1945, 77 y.o., male Today's Date: 08/22/2022   PCP:  Joaquim Nam, MD   REFERRING PROVIDER:  Vladimir Faster, DO  END OF SESSION:  PT End of Session - 08/22/22 1015     Visit Number 6    Number of Visits 13    Date for PT Re-Evaluation 09/30/22    Authorization Type [employer]    PT Start Time 1013    PT Stop Time 1055    PT Time Calculation (min) 42 min    Equipment Utilized During Treatment Gait belt    Activity Tolerance Patient tolerated treatment well    Behavior During Therapy WFL for tasks assessed/performed               Past Medical History:  Diagnosis Date   A-fib (HCC)    Arthritis    GERD (gastroesophageal reflux disease)    H/O urinary frequency    Heart murmur    as a child   History of bronchitis    HSV infection    oral   Hypertension    improved after back pain treated with surgery   Insomnia    Insomnia    Improved with Ambien, did not tolerate melatonin.   Joint pain    Parkinson disease    Sleep apnea    no CPAP   Vertigo 10/13/2014   recurrent; trigger is cool air on left ear, improved with daily antihistamine   Past Surgical History:  Procedure Laterality Date   ACHILLES TENDON REPAIR Left    ALLOGRAFT APPLICATION Left 11/12/2020   Procedure: APPLICATION OF  INTEGRA;  Surgeon: Glenna Fellows, MD;  Location: [facility];  Service: Plastics;  Laterality: Left;   BACK SURGERY     COLONOSCOPY  02/2011   Negative,[facility]   DEBRIDEMENT AND CLOSURE WOUND Left 02/25/2018   Procedure: DEBRIDEMENT LEFT LEG APPLICATION INTEGRA;  Surgeon: Glenna Fellows, MD;  Location: [facility];  Service: Plastics;  Laterality: Left;   EYE SURGERY Bilateral    cataracts   INCISION AND DRAINAGE OF WOUND Left 11/12/2020   Procedure: DEBRIDEMENT LLE ULCER;  Surgeon: Glenna Fellows, MD;   Location: [facility];  Service: Plastics;  Laterality: Left;   KNEE ARTHROSCOPY     L   LUMBAR FUSION     2016   LUMBAR SPINE SURGERY  03/31/2019   exploration of lumbar fusion w/L5-S1 decompression redo L4-5 fusion   SPINE SURGERY  2003   cervical fusion   TONSILLECTOMY     varicoelectomy     Patient Active Problem List   Diagnosis Date Noted   Cough 08/20/2022   PND (post-nasal drip) 07/19/2022   Chest pain 07/02/2022   Dysuria 07/02/2022   Colitis 06/29/2022   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   Paroxysmal atrial fibrillation (HCC) 07/15/2021   Irritant dermatitis 04/07/2021   Tinea pedis of both feet 04/07/2021   Peripheral edema 04/07/2021   Acute bronchitis 02/28/2021   Other fatigue 01/02/2021   Change in bowel habit 08/06/2020   Healthcare maintenance 03/31/2020   Hypothyroidism 03/31/2020   Dysfunction of eustachian tube 03/10/2020   Constipation 11/19/2019   Insomnia    Allergic rhinitis 07/03/2019   Hyponatremia 05/15/2019   Atrial fibrillation, chronic (HCC) 05/15/2019   Parkinson's disease 05/04/2019   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HCC)    Atrial fibrillation  with RVR (HCC) 04/24/2019   History of recent back surgery 04/24/2019   Body mass index (BMI) 25.0-25.9, adult 04/22/2019   Neural foraminal stenosis of lumbosacral spine 03/31/2019   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   Degenerative scoliosis in adult patient 03/20/2019   Other intervertebral disc displacement, lumbosacral region 03/20/2019   Status post lumbar spinal fusion 02/20/2019   Neck pain 02/04/2019   Intermittent tremor 12/09/2018   Acute cough 04/24/2018   Skin lesion 10/31/2017   Lower urinary tract symptoms (LUTS) 04/17/2016   Oral lesion 02/16/2016   Medicare annual wellness visit, initial 12/01/2015   Advance care planning 12/01/2015   Lower back pain 12/01/2015   OSA on CPAP     HTN (hypertension) 04/23/2015   Spondylolisthesis of lumbar region 12/16/2014   GERD (gastroesophageal reflux disease) 10/11/2012   RBBB 06/27/2011    ONSET DATE: 07/27/2022  REFERRING DIAG: G20.A1 (ICD-10-CM) - Parkinson's disease without dyskinesia or fluctuating manifestations   THERAPY DIAG:  Other symptoms and signs involving the nervous system  Unsteadiness on feet  Other abnormalities of gait and mobility  Abnormal posture  Rationale for Evaluation and Treatment: Rehabilitation  SUBJECTIVE:                                                                                                                                                                                             SUBJECTIVE STATEMENT: No changes. Has been working on on his exercises throughout the day. Feels like the balance is gradually getting better.    Pt accompanied by:  Wife, Burna Mortimer (in lobby)   PERTINENT HISTORY: PMH: PD (diagnosed ~2020), HTN, A-fib, arthritis, hx of vertigo, back pain with hx of back surgery (2020, 2023), hx of LLE injury as a kid   PAIN:  Are you having pain? No   PRECAUTIONS: Fall  WEIGHT BEARING RESTRICTIONS: No  FALLS: Has patient fallen in last 6 months? No  LIVING ENVIRONMENT: Lives with: lives with their spouse Lives in: House/apartment Stairs: Yes: Internal: 12 steps; on right going up and External: 2 steps; none - pt does not have to use the stairs inside for the basement  Has following equipment at home: Walker - 2 wheeled, Grab bars, and walking stick  PLOF: Independent, except unable to tie his shoes (due to stiffness from his back)  PATIENT GOALS: Wants to work on his balance   OBJECTIVE:   COGNITION: Overall cognitive status: Within functional limits for tasks assessed   SENSATION: Pt reports numbness in feet after back surgery   COORDINATION: Heel to shin: WNL bilat   OBSERVATIONS: RUE tremor, resting  POSTURE: rounded shoulders and forward  head  LOWER EXTREMITY ROM:     Limited knee extension AROM bilat   LOWER EXTREMITY MMT:    MMT Right Eval Left Eval  Hip flexion 4+ 5  Hip extension    Hip abduction 5 5  Hip adduction 5 5  Hip internal rotation    Hip external rotation    Knee flexion 5 5  Knee extension 4+ 4+  Ankle dorsiflexion 4 4+  Ankle plantarflexion    Ankle inversion    Ankle eversion    (Blank rows = not tested)  All tested in sitting   BED MOBILITY:  Pt reports independence with bed mobility   TRANSFERS: Assistive device utilized: None  Sit to stand: SBA Stand to sit: SBA Pt puts his hands on his thighs, pt with incr forward flexed posture in standing   Pt reports sometimes getting up from the recliner is hard. Pt has difficulty getting off the floor.    GAIT: Gait pattern: step through pattern, decreased arm swing- Right, decreased arm swing- Left, decreased stride length, Right foot flat, Left foot flat, decreased trunk rotation, trunk flexed, and poor foot clearance- Right Distance walked: Clinic distances  Assistive device utilized: None Level of assistance: SBA Comments: Pt holds his arms behind his back or in his pockets due to RUE tremors during gait. Pt reports tremors during gait throws off his balance. Slow when turning with decr step height.   TODAY'S TREATMENT:           Therapeutic Activity: Provided community resources for PD and went over handout with pt, pt most interested in the different online options as he lives in [place] Gait speed: 9.3 seconds = 3.52 ft/sec  Floor transfers x5 reps, pt did well with using PWR Step to rock back onto heels and then step forward big with RLE, pt able to perform with supervision and reporting improvement with floor transfers this way. Pt needing to hold onto the mat table as pt unable to perform with hands on the ground as pt has too much difficulty stepping RLE forwards.   NMR:   Pt performs PWR! Moves in quadruped position on  the floor x 10 reps   PWR! Up for improved posture, cues for hip extension and scap retraction, pt improved with incr reps   PWR! Rock for improved weighshifting, cues to shift hips back to heels, pt reporting a good stretch with this   PWR! Twist for improved trunk rotation   PWR! Step for improved step initiation, pt more difficulty with stepping RLE forwards,  but improved with incr reps. Cues for big rock when shifting back   Cues provided for larger amplitude movement patterns. Pt reporting feeling really good with these and had a good stretch. Added to HEP.    On black side of BOSU: Weight shifting laterally x15 reps, A/P direction x15 reps, cues to look up into mirror for posture, pt needing intermittent UE support  10 reps mini squats with minimal UE support, cues for posture when standing up.    PATIENT EDUCATION: Education details: Programmer, applications, floor transfers, added quadruped PWR moves to [employer]  Person educated: Patient Education method: Programmer, multimedia, Facilities manager, and Verbal cues Education comprehension: verbalized understanding and returned demonstration  HOME EXERCISE PROGRAM: Standing PWR moves  Access Code: [email or web address] URL: [email or web address] Date: 08/08/2022 Prepared by: Alethia Berthold Plaster  Exercises - Corner Balance Feet Together With Eyes Closed  - 1 x daily - 7 x weekly -  3-4 reps - 30-45 second hold - Standing with eyes closed on old pillows/dog bed/blankets with head turns  - 1 x daily - 7 x weekly - 3-4 reps - 30-45 second  hold  GOALS: Goals reviewed with patient? Yes  SHORT TERM GOALS: Target date: 08/22/2022  Pt will be independent with initial HEP for PD specific exercises in order to build upon functional gains made in therapy. Baseline: Goal status: MET  2.  Finish miniBEST with LTG written. Baseline: 22/28 (4/23) Goal status: MET  3.  Pt will improve gait speed with no AD to at least 2.6 ft/sec in order to demo improved  community mobility.   Baseline: 13.9 seconds = 2.36 ft/sec  9.3 seconds = 3.52 ft/sec Goal status: MET    LONG TERM GOALS: Target date: 09/12/2022   Pt will be independent with final HEP for PD specific exercises in order to build upon functional gains made in therapy. Baseline:  Goal status: INITIAL  2.  Pt will improve MiniBest to 25/28 for decreased fall risk and improvement with compensatory stepping strategies.   Baseline: 22/28 Goal status: REVISED  3.   Pt will improve gait speed with no AD to at least 2.9 ft/sec in order to demo improved community mobility. Baseline: 13.9 seconds = 2.36 ft/sec  9.3 seconds = 3.52 ft/sec Goal status: MET  4.  Pt will improve 5x sit<>stand to less than or equal to 15 sec to demonstrate improved functional strength and transfer efficiency.   Baseline: 18.72 seconds with hands on knees Goal status: INITIAL  5.  Pt will verbalize understanding of local Parkinson's disease resources, including options for continue community fitness.  Baseline:  Goal status: INITIAL    ASSESSMENT:  CLINICAL IMPRESSION: Worked on quadruped PWR moves for improved floor transfers and mobility. Pt reporting really enjoying these for a stretch, so added to pt's HEP. Pt reporting using PWR Step to help rock back and step RLE forwards big really helped with floor transfers. Pt able to perform with supervision. Remainder of session focused on balance strategies on BOSU with pt needing cues for posture and looking up into mirror. Continue POC.   OBJECTIVE IMPAIRMENTS: Abnormal gait, decreased activity tolerance, decreased balance, decreased coordination, difficulty walking, decreased strength, hypomobility, impaired flexibility, postural dysfunction, and pain.   ACTIVITY LIMITATIONS: lifting, transfers, and locomotion level  PARTICIPATION LIMITATIONS: community activity  PERSONAL FACTORS: Age, Behavior pattern, Past/current experiences, Time since onset of  injury/illness/exacerbation, and 3+ comorbidities: PD (diagnosed ~2020), HTN, A-fib, arthritis, hx of vertigo, back pain with hx of back surgery (2020, 2023), hx of LLE injury as a kid   are also affecting patient's functional outcome.   REHAB POTENTIAL: Good  CLINICAL DECISION MAKING: Stable/uncomplicated  EVALUATION COMPLEXITY: Moderate  PLAN:  PT FREQUENCY: 2x/week  PT DURATION: 8 weeks - anticipate just 6 weeks  PLANNED INTERVENTIONS: Therapeutic exercises, Therapeutic activity, Neuromuscular re-education, Balance training, Gait training, Patient/Family education, Self Care, Stair training, Vestibular training, DME instructions, Manual therapy, and Re-evaluation  PLAN FOR NEXT SESSION:  work on balance for vestibular input. Anterior weight shifting, standing on unlevel surfaces, continue fall recovery strategies with tips to advance RLE from tall kneel to half kneel,  possibly trial river rocks with balance tasks on mat to increase compliance challenge  Drake Leach, PT, DPT  08/22/2022, 1:11 PM

## 2022-08-25 ENCOUNTER — Ambulatory Visit: Payer: PPO | Admitting: Physical Therapy

## 2022-08-25 DIAGNOSIS — M48062 Spinal stenosis, lumbar region with neurogenic claudication: Secondary | ICD-10-CM | POA: Diagnosis not present

## 2022-08-25 DIAGNOSIS — Z6825 Body mass index (BMI) 25.0-25.9, adult: Secondary | ICD-10-CM | POA: Diagnosis not present

## 2022-08-29 ENCOUNTER — Other Ambulatory Visit: Payer: Self-pay

## 2022-08-29 ENCOUNTER — Telehealth: Payer: Self-pay | Admitting: Anesthesiology

## 2022-08-29 ENCOUNTER — Ambulatory Visit: Payer: PPO | Admitting: Physical Therapy

## 2022-08-29 DIAGNOSIS — R2689 Other abnormalities of gait and mobility: Secondary | ICD-10-CM

## 2022-08-29 DIAGNOSIS — G20A1 Parkinson's disease without dyskinesia, without mention of fluctuations: Secondary | ICD-10-CM

## 2022-08-29 DIAGNOSIS — R29818 Other symptoms and signs involving the nervous system: Secondary | ICD-10-CM | POA: Diagnosis not present

## 2022-08-29 DIAGNOSIS — R2681 Unsteadiness on feet: Secondary | ICD-10-CM

## 2022-08-29 MED ORDER — RYTARY 48.75-195 MG PO CPCR
ORAL_CAPSULE | ORAL | 0 refills | Status: DC
Start: 2022-08-29 — End: 2022-12-11

## 2022-08-29 NOTE — Telephone Encounter (Signed)
Pt called in and left a message. He would like to see if someone could call in the prescription for the carbidopa-levodopa 195 to [facility] on [address] in [place]. He stated he needed it today.

## 2022-08-29 NOTE — Therapy (Signed)
OUTPATIENT PHYSICAL THERAPY NEURO TREATMENT- DISCHARGE SUMMARY   Patient Name: [name] MRN: [id number] DOB:[birth year], 77 y.o., male Today's Date: 08/29/2022   PCP:  Joaquim Nam, MD   REFERRING PROVIDER:  Tat, Octaviano Batty, DO  PHYSICAL THERAPY DISCHARGE SUMMARY  Visits from Start of Care: 7  Current functional level related to goals / functional outcomes: Mod I w/all mobility w/o AD   Remaining deficits: R hand tremor, impaired vestibular input    Education / Equipment: HEP, PWR moves, 38mo PD screens    Patient agrees to discharge. Patient goals were met. Patient is being discharged due to being pleased with the current functional level.   END OF SESSION:  PT End of Session - 08/29/22 0936     Visit Number 7    Number of Visits 13    Date for PT Re-Evaluation 09/30/22    Authorization Type [employer]    PT Start Time 0935   Previous pt session ran late   PT Stop Time 1005   DC   PT Time Calculation (min) 30 min    Equipment Utilized During Treatment Gait belt    Activity Tolerance Patient tolerated treatment well    Behavior During Therapy WFL for tasks assessed/performed                Past Medical History:  Diagnosis Date   A-fib (HCC)    Arthritis    GERD (gastroesophageal reflux disease)    H/O urinary frequency    Heart murmur    as a child   History of bronchitis    HSV infection    oral   Hypertension    improved after back pain treated with surgery   Insomnia    Insomnia    Improved with Ambien, did not tolerate melatonin.   Joint pain    Parkinson disease    Sleep apnea    no CPAP   Vertigo 10/13/2014   recurrent; trigger is cool air on left ear, improved with daily antihistamine   Past Surgical History:  Procedure Laterality Date   ACHILLES TENDON REPAIR Left    ALLOGRAFT APPLICATION Left 11/12/2020   Procedure: APPLICATION OF  INTEGRA;  Surgeon: Glenna Fellows, MD;  Location: [facility];   Service: Plastics;  Laterality: Left;   BACK SURGERY     COLONOSCOPY  02/2011   Negative,[facility]   DEBRIDEMENT AND CLOSURE WOUND Left 02/25/2018   Procedure: DEBRIDEMENT LEFT LEG APPLICATION INTEGRA;  Surgeon: Glenna Fellows, MD;  Location: [facility];  Service: Plastics;  Laterality: Left;   EYE SURGERY Bilateral    cataracts   INCISION AND DRAINAGE OF WOUND Left 11/12/2020   Procedure: DEBRIDEMENT LLE ULCER;  Surgeon: Glenna Fellows, MD;  Location: [facility];  Service: Plastics;  Laterality: Left;   KNEE ARTHROSCOPY     L   LUMBAR FUSION     2016   LUMBAR SPINE SURGERY  03/31/2019   exploration of lumbar fusion w/L5-S1 decompression redo L4-5 fusion   SPINE SURGERY  2003   cervical fusion   TONSILLECTOMY     varicoelectomy     Patient Active Problem List   Diagnosis Date Noted   Cough 08/20/2022   PND (post-nasal drip) 07/19/2022   Chest pain 07/02/2022   Dysuria 07/02/2022   Colitis 06/29/2022   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   Paroxysmal atrial fibrillation (HCC) 07/15/2021   Irritant dermatitis 04/07/2021   Tinea pedis of  both feet 04/07/2021   Peripheral edema 04/07/2021   Acute bronchitis 02/28/2021   Other fatigue 01/02/2021   Change in bowel habit 08/06/2020   Healthcare maintenance 03/31/2020   Hypothyroidism 03/31/2020   Dysfunction of eustachian tube 03/10/2020   Constipation 11/19/2019   Insomnia    Allergic rhinitis 07/03/2019   Hyponatremia 05/15/2019   Atrial fibrillation, chronic (HCC) 05/15/2019   Parkinson's disease 05/04/2019   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HCC)    Atrial fibrillation with RVR (HCC) 04/24/2019   History of recent back surgery 04/24/2019   Body mass index (BMI) 25.0-25.9, adult 04/22/2019   Neural foraminal stenosis of lumbosacral spine 03/31/2019   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   Degenerative  scoliosis in adult patient 03/20/2019   Other intervertebral disc displacement, lumbosacral region 03/20/2019   Status post lumbar spinal fusion 02/20/2019   Neck pain 02/04/2019   Intermittent tremor 12/09/2018   Acute cough 04/24/2018   Skin lesion 10/31/2017   Lower urinary tract symptoms (LUTS) 04/17/2016   Oral lesion 02/16/2016   Medicare annual wellness visit, initial 12/01/2015   Advance care planning 12/01/2015   Lower back pain 12/01/2015   OSA on CPAP    HTN (hypertension) 04/23/2015   Spondylolisthesis of lumbar region 12/16/2014   GERD (gastroesophageal reflux disease) 10/11/2012   RBBB 06/27/2011    ONSET DATE: 07/27/2022  REFERRING DIAG: G20.A1 (ICD-10-CM) - Parkinson's disease without dyskinesia or fluctuating manifestations   THERAPY DIAG:  Unsteadiness on feet  Other abnormalities of gait and mobility  Rationale for Evaluation and Treatment: Rehabilitation  SUBJECTIVE:                                                                                                                                                                                             SUBJECTIVE STATEMENT: No changes. Pt still waiting to hear if he is going to [facility] vs [facility] as neither have confirmed if he is in network. No falls. Working on his exercises regularly and they are going well. Feels like he can DC today.   Pt accompanied by:  Wife, Burna Mortimer (in lobby)   PERTINENT HISTORY: PMH: PD (diagnosed ~2020), HTN, A-fib, arthritis, hx of vertigo, back pain with hx of back surgery (2020, 2023), hx of LLE injury as a kid   PAIN:  Are you having pain? No   PRECAUTIONS: Fall  WEIGHT BEARING RESTRICTIONS: No  FALLS: Has patient fallen in last 6 months? No  LIVING ENVIRONMENT: Lives with: lives with their spouse Lives in: House/apartment Stairs: Yes: Internal: 12 steps; on right going up and External: 2 steps;  none - pt does not have to use the stairs inside for the basement  Has  following equipment at home: Dan Humphreys - 2 wheeled, Grab bars, and walking stick  PLOF: Independent, except unable to tie his shoes (due to stiffness from his back)  PATIENT GOALS: Wants to work on his balance   OBJECTIVE:   COGNITION: Overall cognitive status: Within functional limits for tasks assessed   SENSATION: Pt reports numbness in feet after back surgery   COORDINATION: Heel to shin: WNL bilat   OBSERVATIONS: RUE tremor, resting   POSTURE: rounded shoulders and forward head  LOWER EXTREMITY ROM:     Limited knee extension AROM bilat   LOWER EXTREMITY MMT:    MMT Right Eval Left Eval  Hip flexion 4+ 5  Hip extension    Hip abduction 5 5  Hip adduction 5 5  Hip internal rotation    Hip external rotation    Knee flexion 5 5  Knee extension 4+ 4+  Ankle dorsiflexion 4 4+  Ankle plantarflexion    Ankle inversion    Ankle eversion    (Blank rows = not tested)  All tested in sitting   BED MOBILITY:  Pt reports independence with bed mobility   TRANSFERS: Assistive device utilized: None  Sit to stand: SBA Stand to sit: SBA Pt puts his hands on his thighs, pt with incr forward flexed posture in standing   Pt reports sometimes getting up from the recliner is hard. Pt has difficulty getting off the floor.    GAIT: Gait pattern: step through pattern, decreased arm swing- Right, decreased arm swing- Left, decreased stride length, Right foot flat, Left foot flat, decreased trunk rotation, trunk flexed, and poor foot clearance- Right Distance walked: Clinic distances  Assistive device utilized: None Level of assistance: SBA Comments: Pt holds his arms behind his back or in his pockets due to RUE tremors during gait. Pt reports tremors during gait throws off his balance. Slow when turning with decr step height.   TODAY'S TREATMENT:          Ther Act  LTG Assessment   [facility] PT Assessment - 08/29/22 0945       Transfers   Five time sit to stand comments   13.22s   Hands braced on knees     Ambulation/Gait   Gait velocity 32.8' over 9.78s = 3.35 ft/s no AD      Mini-BESTest   Sit To Stand Normal: Comes to stand without use of hands and stabilizes independently.    Rise to Toes Moderate: Heels up, but not full range (smaller than when holding hands), OR noticeable instability for 3 s.    Stand on one leg (left) Moderate: < 20 s   8s   Stand on one leg (right) Moderate: < 20 s   4.19s   Stand on one leg - lowest score 1    Compensatory Stepping Correction - Forward Normal: Recovers independently with a single, large step (second realignement is allowed).    Compensatory Stepping Correction - Backward Moderate: More than one step is required to recover equilibrium   2 small steps   Compensatory Stepping Correction - Left Lateral Normal: Recovers independently with 1 step (crossover or lateral OK)    Compensatory Stepping Correction - Right Lateral Normal: Recovers independently with 1 step (crossover or lateral OK)    Stepping Corredtion Lateral - lowest score 2    Stance - Feet together, eyes open, firm surface  Normal:  30s    Stance - Feet together, eyes closed, foam surface  Moderate: < 30s   24.81s   Incline - Eyes Closed Normal: Stands independently 30s and aligns with gravity    Change in Gait Speed Normal: Significantly changes walkling speed without imbalance    Walk with head turns - Horizontal Normal: performs head turns with no change in gait speed and good balance    Walk with pivot turns Normal: Turns with feet close FAST (< 3 steps) with good balance.    Step over obstacles Normal: Able to step over box with minimal change of gait speed and with good balance.    Timed UP & GO with Dual Task Moderate: Dual Task affects either counting OR walking (>10%) when compared to the TUG without Dual Task.    Mini-BEST total score 23      Timed Up and Go Test   Normal TUG (seconds) 9   no AD   Cognitive TUG (seconds) 13.57   Incorrect retro  counting by 3s           Discussed goal outcomes and educated pt on scheduling 773mo PD screens. Also educated pt on how to obtain new PT referral if balance/mobility changes prior to 773mo Screen, pt verbalized understanding.   PATIENT EDUCATION: Education details: Continue HEP and PWR moves, how to obtain new PT referral if needed  Person educated: Patient Education method: Explanation Education comprehension: verbalized understanding  HOME EXERCISE PROGRAM: Standing PWR moves  Access Code: [email or web address] URL: [email or web address] Date: 08/08/2022 Prepared by: Alethia Berthold Naylene Foell  Exercises - Corner Balance Feet Together With Eyes Closed  - 1 x daily - 7 x weekly - 3-4 reps - 30-45 second hold - Standing with eyes closed on old pillows/dog bed/blankets with head turns  - 1 x daily - 7 x weekly - 3-4 reps - 30-45 second  hold  GOALS: Goals reviewed with patient? Yes  SHORT TERM GOALS: Target date: 08/22/2022  Pt will be independent with initial HEP for PD specific exercises in order to build upon functional gains made in therapy. Baseline: Goal status: MET  2.  Finish miniBEST with LTG written. Baseline: 22/28 (4/23) Goal status: MET  3.  Pt will improve gait speed with no AD to at least 2.6 ft/sec in order to demo improved community mobility.   Baseline: 13.9 seconds = 2.36 ft/sec  9.3 seconds = 3.52 ft/sec Goal status: MET    LONG TERM GOALS: Target date: 09/12/2022   Pt will be independent with final HEP for PD specific exercises in order to build upon functional gains made in therapy. Baseline:  Goal status: MET  2.  Pt will improve MiniBest to 25/28 for decreased fall risk and improvement with compensatory stepping strategies.   Baseline: 22/28; 23/28 (5/14) Goal status: NOT MET  3.   Pt will improve gait speed with no AD to at least 2.9 ft/sec in order to demo improved community mobility. Baseline: 13.9 seconds = 2.36 ft/sec  9.3 seconds = 3.52  ft/sec Goal status: MET  4.  Pt will improve 5x sit<>stand to less than or equal to 15 sec to demonstrate improved functional strength and transfer efficiency.   Baseline: 18.72 seconds with hands on knees; 13.22s w/hands on knees  Goal status: MET  5.  Pt will verbalize understanding of local Parkinson's disease resources, including options for continue community fitness.  Baseline:  Goal status: MET    ASSESSMENT:  CLINICAL IMPRESSION: Emphasis of skilled PT session  on LTG assessment and DC from PT per pt request. Pt has met 4 of 5 LTGs, 2 of which were met >1 week ago. Pt has significantly improved his gait speed without use of AD and 5x STS without UE support. Pt did improve his minibest by 1 point but continues to be challenged by single leg stability and vestibular input for balance. Pt unable to rise on toes well today due to wearing dress shoes but did demonstrate increased ROM compared to previous assessment. Pt overall pleased with current functional level and is regularly implementing his HEP into his daily activity. Pt scheduled for 34mo PD screens and educated pt on how to obtain new PT referral if balance/mobility declines prior to 34mo screen. Pt verbalized understanding and agreement to DC this date.    OBJECTIVE IMPAIRMENTS: Abnormal gait, decreased activity tolerance, decreased balance, decreased coordination, difficulty walking, decreased strength, hypomobility, impaired flexibility, postural dysfunction, and pain.   ACTIVITY LIMITATIONS: lifting, transfers, and locomotion level  PARTICIPATION LIMITATIONS: community activity  PERSONAL FACTORS: Age, Behavior pattern, Past/current experiences, Time since onset of injury/illness/exacerbation, and 3+ comorbidities: PD (diagnosed ~2020), HTN, A-fib, arthritis, hx of vertigo, back pain with hx of back surgery (2020, 2023), hx of LLE injury as a kid   are also affecting patient's functional outcome.   REHAB POTENTIAL:  Good  CLINICAL DECISION MAKING: Stable/uncomplicated  EVALUATION COMPLEXITY: Moderate  PLAN:  PT FREQUENCY: 2x/week  PT DURATION: 8 weeks - anticipate just 6 weeks  PLANNED INTERVENTIONS: Therapeutic exercises, Therapeutic activity, Neuromuscular re-education, Balance training, Gait training, Patient/Family education, Self Care, Stair training, Vestibular training, DME instructions, Manual therapy, and Re-evaluation  Aveion Nguyen E Kamori Kitchens, PT, DPT  08/29/2022, 10:07 AM

## 2022-08-29 NOTE — Telephone Encounter (Signed)
Called patient and left message we have no samples in the office and to see if he has heard from [facility] regarding the referral for ultrasound

## 2022-08-29 NOTE — Telephone Encounter (Signed)
Sent prescription and called patient  

## 2022-08-29 NOTE — Telephone Encounter (Signed)
Pt called stating he would like to get some samples of the rytary 48.75-195 mg. States Dr Tat advise him to try some samples and he is out, needs more.

## 2022-08-30 ENCOUNTER — Telehealth: Payer: Self-pay | Admitting: Pharmacy Technician

## 2022-08-30 ENCOUNTER — Telehealth: Payer: Self-pay | Admitting: Neurology

## 2022-08-30 ENCOUNTER — Other Ambulatory Visit (HOSPITAL_COMMUNITY): Payer: Self-pay

## 2022-08-30 NOTE — Telephone Encounter (Signed)
Patient Advocate Encounter  Prior Authorization for [name] has been approved with [employer].    PA# [id number] Effective dates: 5.15.24 through 12.31.24  Per [facility] test claim, copay for 90 days supply is $200  #90/30 DAY IS $100 #180/60 IS $200

## 2022-08-30 NOTE — Telephone Encounter (Addendum)
Patient Advocate Encounter  Received notification from [facility] that prior authorization for RYTARY is required.   PA submitted on 5.15.24 CMM: Key [id number] FAX: [phone] Status is pending

## 2022-08-30 NOTE — Telephone Encounter (Signed)
I called patient and he has reached out to [employer] and that they have approved the referral to [facility] for this patient to get his focused ultra sound there. Patient said as long as the referral comes from an in network doctor and we do not do this procedure here then they can approve the procedure  Patient really doesn't want to travel to [facility]. I reached out to Dr. Yetta Numbers office to see if there is anything I can do to fasciculate helping this patient get seen at [facility]

## 2022-08-30 NOTE — Telephone Encounter (Signed)
Pt wants to speak to someone about some procedures that are going to be done at [facility] and would like to speak to [facility]

## 2022-08-31 ENCOUNTER — Telehealth: Payer: Self-pay | Admitting: Family Medicine

## 2022-08-31 MED ORDER — PREDNISONE 10 MG PO TABS: ORAL_TABLET | ORAL | 0 refills | Status: DC

## 2022-08-31 NOTE — Telephone Encounter (Signed)
Patient called in stating that he almost got rid of the cough he has been treating prednisone,but he still has it and is out of the medication. The cough is better but not all the way gone.He would like to know if he can have more prednisone sent in for him?     [id number] Judithann Sheen, [place] - [address] Phone: [phone]  Fax: [phone]

## 2022-08-31 NOTE — Telephone Encounter (Signed)
I sent the rx.  If not better with this then likely needs recheck. Thanks.

## 2022-09-01 ENCOUNTER — Ambulatory Visit: Payer: PPO | Admitting: Physical Therapy

## 2022-09-01 NOTE — Telephone Encounter (Signed)
Called patient and reviewed all information. Patient verbalized understanding. Will call if any further questions.  

## 2022-09-05 ENCOUNTER — Ambulatory Visit: Payer: PPO | Admitting: Physical Therapy

## 2022-09-08 ENCOUNTER — Ambulatory Visit: Payer: PPO | Admitting: Physical Therapy

## 2022-09-12 ENCOUNTER — Ambulatory Visit (INDEPENDENT_AMBULATORY_CARE_PROVIDER_SITE_OTHER): Payer: PPO | Admitting: Family Medicine

## 2022-09-12 ENCOUNTER — Ambulatory Visit: Payer: PPO | Admitting: Physical Therapy

## 2022-09-12 ENCOUNTER — Encounter: Payer: Self-pay | Admitting: Family Medicine

## 2022-09-12 VITALS — BP 152/86 | HR 92 | Temp 97.5°F | Ht 75.0 in | Wt 204.2 lb

## 2022-09-12 DIAGNOSIS — R21 Rash and other nonspecific skin eruption: Secondary | ICD-10-CM | POA: Diagnosis not present

## 2022-09-12 MED ORDER — DOXYCYCLINE HYCLATE 100 MG PO TABS
100.0000 mg | ORAL_TABLET | Freq: Two times a day (BID) | ORAL | 0 refills | Status: DC
Start: 2022-09-12 — End: 2022-12-11

## 2022-09-12 MED ORDER — TRIAMCINOLONE ACETONIDE 0.1 % EX CREA: 1.0000 | TOPICAL_CREAM | Freq: Two times a day (BID) | CUTANEOUS | 0 refills | Status: DC
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
## 2022-09-12 NOTE — Patient Instructions (Signed)
Possible infection vs skin reaction to unknown exposure Continue benadryl 25mg  as needed, as well as topical steroid I've sent to pharmacy. May take doxycycline 100mg  twice daily for 1 week to cover any possible skin infection.  Let us know if not improving with treatment.

## 2022-09-12 NOTE — Assessment & Plan Note (Addendum)
Several days of skin rash overlying scar at previous site of remote trauma.  No hardware present.  Anticipate component of contact dermatitis but unclear exposure source. Will prescribe stronger topical steroid to use PRN.  Given location of rash, will cover with 1 wk doxycycline course. Reviewed photosensitivity precautions. Update if not improving with treatment. Pt agrees with plan.

## 2022-09-15 ENCOUNTER — Ambulatory Visit: Payer: PPO | Admitting: Physical Therapy

## 2022-09-25 ENCOUNTER — Other Ambulatory Visit (HOSPITAL_COMMUNITY): Payer: Self-pay

## 2022-10-05 ENCOUNTER — Other Ambulatory Visit: Payer: Self-pay | Admitting: Cardiology

## 2022-10-23 DIAGNOSIS — G20A2 Parkinson's disease without dyskinesia, with fluctuations: Secondary | ICD-10-CM | POA: Diagnosis not present

## 2022-10-23 DIAGNOSIS — R251 Tremor, unspecified: Secondary | ICD-10-CM | POA: Diagnosis not present

## 2022-10-23 DIAGNOSIS — Z01818 Encounter for other preprocedural examination: Secondary | ICD-10-CM | POA: Diagnosis not present

## 2022-10-27 ENCOUNTER — Other Ambulatory Visit: Payer: Self-pay | Admitting: Cardiology

## 2022-11-04 ENCOUNTER — Other Ambulatory Visit (HOSPITAL_COMMUNITY): Payer: Self-pay

## 2022-11-08 DIAGNOSIS — H35033 Hypertensive retinopathy, bilateral: Secondary | ICD-10-CM | POA: Diagnosis not present

## 2022-11-09 ENCOUNTER — Other Ambulatory Visit (HOSPITAL_COMMUNITY): Payer: Self-pay

## 2022-11-13 DIAGNOSIS — G250 Essential tremor: Secondary | ICD-10-CM | POA: Diagnosis not present

## 2022-11-13 DIAGNOSIS — R2689 Other abnormalities of gait and mobility: Secondary | ICD-10-CM | POA: Diagnosis not present

## 2022-11-13 DIAGNOSIS — R251 Tremor, unspecified: Secondary | ICD-10-CM | POA: Diagnosis not present

## 2022-11-13 DIAGNOSIS — Z01818 Encounter for other preprocedural examination: Secondary | ICD-10-CM | POA: Diagnosis not present

## 2022-11-26 ENCOUNTER — Other Ambulatory Visit: Payer: Self-pay | Admitting: Neurology

## 2022-11-29 DIAGNOSIS — G20A1 Parkinson's disease without dyskinesia, without mention of fluctuations: Secondary | ICD-10-CM | POA: Diagnosis not present

## 2022-11-29 DIAGNOSIS — G250 Essential tremor: Secondary | ICD-10-CM | POA: Diagnosis not present

## 2022-11-29 DIAGNOSIS — R251 Tremor, unspecified: Secondary | ICD-10-CM | POA: Diagnosis not present

## 2022-11-30 ENCOUNTER — Encounter (INDEPENDENT_AMBULATORY_CARE_PROVIDER_SITE_OTHER): Payer: Self-pay

## 2022-12-01 ENCOUNTER — Telehealth: Payer: Self-pay | Admitting: Neurology

## 2022-12-01 ENCOUNTER — Other Ambulatory Visit (HOSPITAL_COMMUNITY): Payer: Self-pay

## 2022-12-01 NOTE — Telephone Encounter (Signed)
Called PATIENT and gave Dr. Iona Beard recommendations

## 2022-12-01 NOTE — Telephone Encounter (Signed)
Pt is calling in stating that he went to [facility] and had the procedure done for his parkinson's and no longer has the shakes (temors?).  Pt was wanting to see if Dr. Arbutus Leas was wanting to reduce some of his medication or what would be the next steps for him.  Pt is aware that the provider is out of the office and will return on next Wednesday.

## 2022-12-04 ENCOUNTER — Telehealth: Payer: Self-pay | Admitting: Neurology

## 2022-12-04 DIAGNOSIS — R2689 Other abnormalities of gait and mobility: Secondary | ICD-10-CM | POA: Diagnosis not present

## 2022-12-04 DIAGNOSIS — G20A2 Parkinson's disease without dyskinesia, with fluctuations: Secondary | ICD-10-CM | POA: Diagnosis not present

## 2022-12-04 DIAGNOSIS — G250 Essential tremor: Secondary | ICD-10-CM | POA: Diagnosis not present

## 2022-12-04 DIAGNOSIS — R251 Tremor, unspecified: Secondary | ICD-10-CM | POA: Diagnosis not present

## 2022-12-04 NOTE — Telephone Encounter (Signed)
Called and left voicemail message.

## 2022-12-04 NOTE — Telephone Encounter (Signed)
Caller stated he just left [facility] and needs to speak with nurse regarding parkinson's medication

## 2022-12-05 ENCOUNTER — Telehealth: Payer: Self-pay | Admitting: Neurology

## 2022-12-05 NOTE — Telephone Encounter (Signed)
Patient called after hours line, stating he was returning a phone call

## 2022-12-05 NOTE — Telephone Encounter (Signed)
Patient went and saw Dr. Carlyn Reichert at [facility] and notes are in media. Dr. Carlyn Reichert would like patient to be seen to go over medication needs with Dr. Arbutus Leas patient is scheduled to see Dr. Arbutus Leas

## 2022-12-05 NOTE — Telephone Encounter (Signed)
Called patient left message

## 2022-12-05 NOTE — Telephone Encounter (Signed)
I put him in for next week . Hope that is ok with you

## 2022-12-05 NOTE — Telephone Encounter (Deleted)
Patient called our after hours line, stating that he was returning a phone call

## 2022-12-07 NOTE — Progress Notes (Signed)
Assessment/Plan:   1.  Parkinsons Disease with levodopa resistant tremor   -Patient is status post focused ultrasound to the left VIM at [facility] on November 29, 2022.  This certainly helped his levodopa resistant tremor, but unfortunately he had some balance difficulties after.  This has improved dramatically and patient continues to improve from that regard  -Patient was hoping to taper the medication.  I reminded him that he still had Parkinsons disease, which is much more than tremor.  However, I do think we can wean off the pramipexole and lower the levodopa.  He agreed  -Weaning schedule to get off of the pramipexole was given  -Decrease Rytary to 145 mg 3 times per day.  Samples provided.  We may be able to decrease further.  Discussed studies that showed all patient should be on levodopa, even early in the disease.  2.  Atrial fibrillation hx  -off Xarelto for quite some time due to no recurrence of a-fib, which occurred post op.  Follows with Dr. Jacinto Halim  3.  Insomnia  -Continue mirtazapine, 15 mg at bed   4.  Back pain  -Sees Dr. Lovell Sheehan. Pt had surgery in July, 2023  -Patient stated Dr. Lovell Sheehan told him that he thought he had neuropathy as well.  We would do EMG.  -Patient with intermittent paresthesias in the feet.  Depending on the results of the EMG, he is to think about whether or not he really wants medication for that.   Subjective:   [name] was seen today in follow up for Parkinsons disease.  My previous records were reviewed prior to todays visit as well as outside records available to me.   Wife with patient who supplements the history.  Patient currently on Rytary and pramipexole.  He underwent focused ultrasound at [facility] to the left VIM on August 14.  Patient did have balance difficulties after this and was placed in physical therapy.  He is not in PT right now.  He does state that balance is a "whole lot" better right now.  Tremor is resolved per pt.  He sent an  email after that as he wanted to taper off Parkinson's medicines.  He presents to discuss.  He also c/o numbness of toes/feet and states that Dr. Lovell Sheehan told him he has neuropathy.  No DM.  The feet comes and goes and is worse when back pain is worse.  It involves all of the toes on both feet.    Current prescribed movement disorder medications: Pramipexole 0.5 mg three times per day Rytary 195 mg, 1 capsule 3 times per day Mirtazapine, 15 mg at bed Propranolol 20 mg bid  PREVIOUS MEDICATIONS: Levodopa IR (nausea); carbidopa/levodopa 25/100 CR (nausea)  ALLERGIES:   Allergies  Allergen Reactions   Flexeril [Cyclobenzaprine] Other (See Comments)    Constipation   Hctz [Hydrochlorothiazide] Other (See Comments)    Hyponatremia    Morphine And Codeine Hives and Other (See Comments)    Hallucinations Can tolerate oxycodone   Phenergan [Promethazine] Other (See Comments)    Told to avoid due to Parkinson's   Sinemet [Carbidopa-Levodopa] Other (See Comments)    Intolerant of short acting form   Tambocor [Flecainide] Nausea And Vomiting    CURRENT MEDICATIONS:  Outpatient Encounter Medications as of 12/11/2022  Medication Sig   acetaminophen (TYLENOL) 500 MG tablet Take 1,000 mg by mouth daily as needed for moderate pain.   Carbidopa-Levodopa ER (RYTARY) 48.75-195 MG CPCR 1 capsule 3 times  daily   Carbidopa-Levodopa ER (SINEMET CR) 25-100 MG tablet controlled release TAKE 1 TABLET BY MOUTH AT 7 AM, 1 AT 11 AM, AND 1 AT 4PM   cetirizine (ZYRTEC) 10 MG tablet TAKE 1 TABLET BY MOUTH EVERY DAY   diclofenac Sodium (VOLTAREN) 1 % GEL Apply 1 Application topically 4 (four) times daily as needed (pain.).   diltiazem (CARDIZEM CD) 180 MG 24 hr capsule TAKE 1 CAPSULE BY MOUTH EVERY DAY   docusate sodium (COLACE) 100 MG capsule Take 1 capsule (100 mg total) by mouth 2 (two) times daily.   fluticasone (FLONASE) 50 MCG/ACT nasal spray Place 2 sprays into both nostrils daily.   Multiple  Vitamins-Minerals (MULTIVITAMIN MEN 50+) TABS Take 1 tablet by mouth daily.   omeprazole (PRILOSEC OTC) 20 MG tablet Take 20 mg by mouth daily as needed (heartburn).   potassium chloride (KLOR-CON) 10 MEQ tablet TAKE 1 TABLET BY MOUTH TWICE A DAY   sildenafil (REVATIO) 20 MG tablet Take 20-100 mg by mouth daily as needed.   triamcinolone cream (KENALOG) 0.1 % Apply 1 Application topically 2 (two) times daily. Apply to AA.   [DISCONTINUED] doxycycline (VIBRA-TABS) 100 MG tablet Take 1 tablet (100 mg total) by mouth 2 (two) times daily.   [DISCONTINUED] predniSONE (DELTASONE) 10 MG tablet Take 2 a day for 5 days, then 1 a day for 5 days, with food. Don't take with aleve/ibuprofen.   No facility-administered encounter medications on file as of 12/11/2022.    Objective:   PHYSICAL EXAMINATION:    VITALS:   Vitals:   12/11/22 1048  BP: 122/76  Pulse: 68  SpO2: 99%  Weight: 206 lb 12.8 oz (93.8 kg)  Height: 6\' 1"  (1.854 m)     GEN:  The patient appears stated age and is in NAD. HEENT:  Normocephalic, atraumatic.  The mucous membranes are moist. The superficial temporal arteries are without ropiness or tenderness. Cv:  RRR Lungs:  CTAB  Neurological examination:  Orientation: The patient is alert and oriented x3. Cranial nerves: There is good facial symmetry with facial hypomimia. The speech is fluent and clear. Soft palate rises symmetrically and there is no tongue deviation. Hearing is intact to conversational tone. Sensation: Sensation is intact to light touch throughout.  Vibration is slightly decreased distally.  No extinction with dss Motor: Strength is at least antigravity x4.   Movement examination: Tone: There is nl tone in the UE/LE Abnormal movements:no rest tremor Coordination:  There is mild decremation on the R and with toe taps bilaterally.   Gait and Station: The patient has no difficulty arising out of a deep-seated chair without the use of the hands. The patient's  stride length is decreased and he is forward flexed (he is having back pain).  He is shuffling  I have reviewed and interpreted the following labs independently  Lab Results  Component Value Date   VITAMINB12 688 12/30/2020       Chemistry      Component Value Date/Time   NA 135 08/04/2022 1030   NA 139 07/01/2020 1021   K 3.9 08/04/2022 1030   CL 101 08/04/2022 1030   CO2 23 08/04/2022 1030   BUN 9 08/04/2022 1030   BUN 12 07/01/2020 1021   CREATININE 1.14 08/04/2022 1030   CREATININE 1.06 01/20/2022 0858      Component Value Date/Time   CALCIUM 8.6 08/04/2022 1030   ALKPHOS 64 06/29/2022 1336   AST 20 06/29/2022 1336   ALT 15 06/29/2022  1336   BILITOT 1.0 06/29/2022 1336       Lab Results  Component Value Date   WBC 6.8 08/04/2022   HGB 12.9 (L) 08/04/2022   HCT 37.9 (L) 08/04/2022   MCV 92.3 08/04/2022   PLT 228.0 08/04/2022    Lab Results  Component Value Date   TSH 3.50 07/15/2021   Total time spent on today's visit was 32 minutes, including both face-to-face time and nonface-to-face time.  Time included that spent on review of records (prior notes available to me/labs/imaging if pertinent), discussing treatment and goals, answering patient's questions and coordinating care.   Cc:  Joaquim Nam, MD

## 2022-12-11 ENCOUNTER — Other Ambulatory Visit: Payer: Self-pay

## 2022-12-11 ENCOUNTER — Ambulatory Visit: Payer: PPO | Admitting: Neurology

## 2022-12-11 ENCOUNTER — Encounter: Payer: Self-pay | Admitting: Neurology

## 2022-12-11 ENCOUNTER — Other Ambulatory Visit: Payer: Self-pay | Admitting: Neurology

## 2022-12-11 VITALS — BP 122/76 | HR 68 | Ht 73.0 in | Wt 206.8 lb

## 2022-12-11 DIAGNOSIS — G609 Hereditary and idiopathic neuropathy, unspecified: Secondary | ICD-10-CM

## 2022-12-11 DIAGNOSIS — M25511 Pain in right shoulder: Secondary | ICD-10-CM | POA: Diagnosis not present

## 2022-12-11 DIAGNOSIS — H35363 Drusen (degenerative) of macula, bilateral: Secondary | ICD-10-CM | POA: Diagnosis not present

## 2022-12-11 DIAGNOSIS — G6282 Radiation-induced polyneuropathy: Secondary | ICD-10-CM | POA: Diagnosis not present

## 2022-12-11 DIAGNOSIS — G20A1 Parkinson's disease without dyskinesia, without mention of fluctuations: Secondary | ICD-10-CM

## 2022-12-11 MED ORDER — PRAMIPEXOLE DIHYDROCHLORIDE 0.5 MG PO TABS: 0.5000 mg | ORAL_TABLET | Freq: Three times a day (TID) | ORAL | 0 refills | Status: DC

## 2022-12-11 MED ORDER — RYTARY 36.25-145 MG PO CPCR
ORAL_CAPSULE | ORAL | Status: DC
Start: 2022-12-11 — End: 2023-07-20

## 2022-12-11 NOTE — Patient Instructions (Addendum)
Decrease rytary to 145 mg three times per day Decrease pramipexole to 0.5 mg twice per day for 2 weeks and then once per day for 2 weeks and then STOP pramipexole  SAVE THE DATE!  We are planning a Parkinsons Disease educational symposium at [facility] in [place] on October 11.  More details to come!  If you would like to be added to our email list to get further information, email [email or web address].  We hope to see you there!

## 2022-12-13 NOTE — Telephone Encounter (Signed)
Dr. Arbutus Leas did you want to cancel this appointment and push it back several months for a 6 month F/U?

## 2022-12-21 ENCOUNTER — Ambulatory Visit: Payer: PPO | Admitting: Neurology

## 2022-12-21 DIAGNOSIS — G20A1 Parkinson's disease without dyskinesia, without mention of fluctuations: Secondary | ICD-10-CM | POA: Diagnosis not present

## 2022-12-21 DIAGNOSIS — G6282 Radiation-induced polyneuropathy: Secondary | ICD-10-CM

## 2022-12-21 NOTE — Procedures (Signed)
[facility]  [address] [place], [address]  [place], [place] [address] Tel: [phone] Fax: [phone] Test Date:  12/21/2022  Patient: [name] DOB: [birth year] Physician: Nita Sickle, DO  Sex: Male Height: 6\' 1"  Ref Phys: Kerin Salen, DO  ID#: [id number]   Technician:    History: This is a 77 year old man referred for evaluation of gait unsteadiness and lower extremity pain.  NCV & EMG Findings: Extensive electrodiagnostic testing of the right lower extremity and additional studies of the left shows:  Bilateral sural and superficial peroneal sensory responses are absent. Bilateral peroneal (EDB) and tibial motor responses are absent.  Peroneal motor response at the right is reduced (R2.2 mV), and normal on the left. Bilateral tibial H-reflex studies are prolonged.  Chronic motor axonal loss changes are seen affecting the muscles below the knee, which is worse on the right.  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   ___________________________ Nita Sickle, DO    Nerve Conduction Studies   Stim Site NR Peak (ms) Norm Peak (ms) O-P Amp (V) Norm O-P Amp  Left Sup Peroneal Anti Sensory (Ant Lat Mall)  32 C  12 cm *NR  <4.6  >3  Right Sup Peroneal Anti Sensory (Ant Lat Mall)  32 C  12 cm *NR  <4.6  >3  Left Sural Anti Sensory (Lat Mall)  32 C  Calf *NR  <4.6  >3  Right Sural Anti Sensory (Lat Mall)  32 C  Calf *NR  <4.6  >3     Stim Site NR Onset (ms) Norm Onset (ms) O-P Amp (mV) Norm O-P Amp Site1 Site2 Delta-0 (ms) Dist (cm) Vel (m/s) Norm Vel (m/s)  Left Peroneal Motor (Ext Dig Brev)  32 C  Ankle *NR  <6.0  >2.5 B Fib Ankle  0.0  >40  B Fib *NR     Poplt B Fib  0.0  >40  Poplt *NR            Right Peroneal Motor (Ext Dig Brev)  32 C  Ankle *NR  <6.0  >2.5 B Fib Ankle  0.0  >40  B Fib *NR     Poplt B Fib  0.0   >40  Poplt *NR            Left Peroneal TA Motor (Tib Ant)  32 C  Fib Head    4.1 <4.5 3.5 >3 Poplit Fib Head 1.6 10.0 62 >40  Poplit    5.7 <5.7 3.5         Right Peroneal TA Motor (Tib Ant)  32 C  Fib Head    3.3 <4.5 *2.2 >3 Poplit Fib Head 1.6 9.0 56 >40  Poplit    4.9 <5.7 1.9         Left Tibial Motor (Abd Hall Brev)  32 C  Ankle *NR  <6.0  >4 Knee Ankle  0.0  >40  Knee *NR            Right Tibial Motor (Abd Hall Brev)  32 C  Ankle *NR  <6.0  >4 Knee Ankle  0.0  >40  Knee *NR             Electromyography   Side Muscle Ins.Act Fibs Fasc Recrt Amp Dur Poly Activation Comment  Right AntTibialis Nml Nml Nml *2- *1+ *1+ *1+ Nml N/A  Right Gastroc Nml Nml Nml *2- *  1+ *1+ *1+ Nml N/A  Right Flex Dig Long Nml Nml Nml *2- *1+ *1+ *1+ Nml N/A  Right RectFemoris Nml Nml Nml Nml Nml Nml Nml Nml N/A  Right GluteusMed Nml Nml Nml Nml Nml Nml Nml Nml N/A  Left BicepsFemS Nml Nml Nml Nml Nml Nml Nml Nml N/A  Left AntTibialis Nml Nml Nml *1- *1+ *1+ *1+ Nml N/A  Left Gastroc Nml Nml Nml *1- *1+ *1+ *1+ Nml N/A  Left RectFemoris Nml Nml Nml Nml Nml Nml Nml Nml N/A  Left GluteusMed Nml Nml Nml Nml Nml Nml Nml Nml N/A      Waveforms:

## 2022-12-26 ENCOUNTER — Ambulatory Visit (INDEPENDENT_AMBULATORY_CARE_PROVIDER_SITE_OTHER): Payer: PPO

## 2022-12-26 VITALS — Ht 75.0 in | Wt 205.0 lb

## 2022-12-26 DIAGNOSIS — Z0000 Encounter for general adult medical examination without abnormal findings: Secondary | ICD-10-CM | POA: Diagnosis not present

## 2022-12-26 NOTE — Progress Notes (Signed)
Subjective:   [name] is a 77 y.o. male who presents for Medicare Annual/Subsequent preventive examination.  Visit Complete: Virtual  I connected with  [name] on 12/26/22 by a audio enabled telemedicine application and verified that I am speaking with the correct person using two identifiers.  Patient Location: Home  Provider Location: Office/Clinic  I discussed the limitations of evaluation and management by telemedicine. The patient expressed understanding and agreed to proceed.  Vital Signs: Because this visit was a virtual/telehealth visit, some criteria may be missing or patient reported. Any vitals not documented were not able to be obtained and vitals that have been documented are patient reported.    Review of Systems      Cardiac Risk Factors include: advanced age (>62men, >75 women);hypertension;male gender;sedentary lifestyle     Objective:    Today's Vitals   12/26/22 0843  Weight: 205 lb (93 kg)  Height: 6\' 3"  (1.905 m)   Body mass index is 25.62 kg/m.     12/26/2022    8:53 AM 12/11/2022   10:53 AM 08/01/2022    9:23 AM 07/27/2022    8:10 AM 06/29/2022    4:30 PM 02/28/2022    8:40 AM 10/13/2021    8:54 AM  Advanced Directives  Does Patient Have a Medical Advance Directive? Yes Yes Yes Yes No Yes Yes  Type of Estate agent of [place];Living will Living will Healthcare Power of [employer] Power of [employer] Power of [place];Living will;Out of facility DNR (pink MOST or yellow form) Healthcare Power of [place];Living will  Does patient want to make changes to medical advance directive?    No - Guardian declined     Copy of Healthcare Power of Attorney in Chart? No - copy requested  No - copy requested No - copy requested   Yes - validated most recent copy scanned in chart (See row information)    Current Medications (verified) Outpatient Encounter Medications as of 12/26/2022  Medication Sig    acetaminophen (TYLENOL) 500 MG tablet Take 1,000 mg by mouth daily as needed for moderate pain.   Carbidopa-Levodopa ER (RYTARY) 36.25-145 MG CPCR Samples of this drug were given to the patient, quantity 3, Lot Number [id number] A exp 10/24   cetirizine (ZYRTEC) 10 MG tablet TAKE 1 TABLET BY MOUTH EVERY DAY   diclofenac Sodium (VOLTAREN) 1 % GEL Apply 1 Application topically 4 (four) times daily as needed (pain.).   diltiazem (CARDIZEM CD) 180 MG 24 hr capsule TAKE 1 CAPSULE BY MOUTH EVERY DAY   docusate sodium (COLACE) 100 MG capsule Take 1 capsule (100 mg total) by mouth 2 (two) times daily.   fluticasone (FLONASE) 50 MCG/ACT nasal spray Place 2 sprays into both nostrils daily.   Multiple Vitamins-Minerals (MULTIVITAMIN MEN 50+) TABS Take 1 tablet by mouth daily.   omeprazole (PRILOSEC OTC) 20 MG tablet Take 20 mg by mouth daily as needed (heartburn).   potassium chloride (KLOR-CON) 10 MEQ tablet TAKE 1 TABLET BY MOUTH TWICE A DAY   sildenafil (REVATIO) 20 MG tablet Take 20-100 mg by mouth daily as needed.   triamcinolone cream (KENALOG) 0.1 % Apply 1 Application topically 2 (two) times daily. Apply to AA.   pramipexole (MIRAPEX) 0.5 MG tablet Take 1 tablet (0.5 mg total) by mouth 3 (three) times daily. (Patient not taking: Reported on 12/26/2022)   No facility-administered encounter medications on file as of 12/26/2022.    Allergies (verified) Flexeril [cyclobenzaprine], Hctz [hydrochlorothiazide], Morphine  and codeine, Phenergan [promethazine], Sinemet [carbidopa-levodopa], and Tambocor [flecainide]   History: Past Medical History:  Diagnosis Date   A-fib (HCC)    Arthritis    GERD (gastroesophageal reflux disease)    H/O urinary frequency    Heart murmur    as a child   History of bronchitis    HSV infection    oral   Hypertension    improved after back pain treated with surgery   Insomnia    Insomnia    Improved with Ambien, did not tolerate melatonin.   Joint pain    Parkinson  disease    Sleep apnea    no CPAP   Vertigo 10/13/2014   recurrent; trigger is cool air on left ear, improved with daily antihistamine   Past Surgical History:  Procedure Laterality Date   ACHILLES TENDON REPAIR Left    ALLOGRAFT APPLICATION Left 11/12/2020   Procedure: APPLICATION OF  INTEGRA;  Surgeon: Glenna Fellows, MD;  Location: [facility];  Service: Plastics;  Laterality: Left;   BACK SURGERY     COLONOSCOPY  02/2011   Negative,[facility]   DEBRIDEMENT AND CLOSURE WOUND Left 02/25/2018   Procedure: DEBRIDEMENT LEFT LEG APPLICATION INTEGRA;  Surgeon: Glenna Fellows, MD;  Location: [facility];  Service: Plastics;  Laterality: Left;   EYE SURGERY Bilateral    cataracts   INCISION AND DRAINAGE OF WOUND Left 11/12/2020   Procedure: DEBRIDEMENT LLE ULCER;  Surgeon: Glenna Fellows, MD;  Location:  SURGERY CENTER;  Service: Plastics;  Laterality: Left;   KNEE ARTHROSCOPY     L   LUMBAR FUSION     2016   LUMBAR SPINE SURGERY  03/31/2019   exploration of lumbar fusion w/L5-S1 decompression redo L4-5 fusion   SPINE SURGERY  2003   cervical fusion   TONSILLECTOMY     varicoelectomy     Family History  Problem Relation Age of Onset   Hypertension Mother    Asthma Mother    Diabetes Father    Stroke Father    Parkinson's disease Father    Cancer Sister    Heart attack Paternal Grandfather        >55   Occular Albinism Son    Hypertension Son    Tourist information centre manager Albinism Daughter    Colon cancer Neg Hx    Prostate cancer Neg Hx    Social History   Socioeconomic History   Marital status: Married    Spouse name: Not on file   Number of children: 2   Years of education: 14   Highest education level: Not on file  Occupational History   Occupation: retired  Tobacco Use   Smoking status: Never    Passive exposure: Never   Smokeless tobacco: Never  Vaping Use   Vaping status: Never Used  Substance and Sexual Activity    Alcohol use: No   Drug use: No   Sexual activity: Not on file  Other Topics Concern   Not on file  Social History Narrative   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     Social Determinants of Health   Financial Resource Strain: Low Risk  (12/26/2022)   Overall Financial Resource Strain (CARDIA)    Difficulty of Paying Living Expenses:  Not hard at all  Food Insecurity: No Food Insecurity (12/26/2022)   Hunger Vital Sign    Worried About Running Out of Food in the Last Year: Never true    Ran Out of Food in the Last Year: Never true  Transportation Needs: No Transportation Needs (12/26/2022)   PRAPARE - Administrator, Civil Service (Medical): No    Lack of Transportation (Non-Medical): No  Physical Activity: Inactive (12/26/2022)   Exercise Vital Sign    Days of Exercise per Week: 0 days    Minutes of Exercise per Session: 0 min  Stress: No Stress Concern Present (12/26/2022)   [employer] of Occupational Health - Occupational Stress Questionnaire    Feeling of Stress : Not at all  Social Connections: Moderately Integrated (12/26/2022)   Social Connection and Isolation Panel [NHANES]    Frequency of Communication with Friends and Family: More than three times a week    Frequency of Social Gatherings with Friends and Family: More than three times a week    Attends Religious Services: More than 4 times per year    Active Member of [employer] or Organizations: No    Attends Engineer, structural: Never    Marital Status: Married    Tobacco Counseling Counseling given: Not Answered   Clinical Intake:  Pre-visit preparation completed: Yes  Pain : No/denies pain     BMI - recorded: 25.62 Nutritional Status: BMI 25 -29 Overweight Nutritional Risks:  None Diabetes: No  How often do you need to have someone help you when you read instructions, pamphlets, or other written materials from your doctor or pharmacy?: 1 - Never  Interpreter Needed?: No  Information entered by :: C.Minyon Billiter LPN   Activities of Daily Living    12/26/2022    8:53 AM  In your present state of health, do you have any difficulty performing the following activities:  Hearing? 0  Vision? 0  Difficulty concentrating or making decisions? 0  Walking or climbing stairs? 0  Dressing or bathing? 1  Comment wife assists with shoes and socks  Doing errands, shopping? 0  Preparing Food and eating ? N  Using the Toilet? N  In the past six months, have you accidently leaked urine? N  Do you have problems with loss of bowel control? N  Managing your Medications? N  Managing your Finances? N  Housekeeping or managing your Housekeeping? N    Patient Care Team: Joaquim Nam, MD as PCP - General (Family Medicine) Tat, Octaviano Batty, DO as Consulting Physician (Neurology) Tressie Stalker, MD as Consulting Physician (Neurosurgery)  Indicate any recent Medical Services you may have received from other than [facility] providers in the past year (date may be approximate).     Assessment:   This is a routine wellness examination for [name].  Hearing/Vision screen Hearing Screening - Comments:: Denies hearing difficulties   Vision Screening - Comments:: Readers - Dr.Thurmond - UTD with eye exams   Goals Addressed             This Visit's Progress    Patient Stated       Rebuild strength and be more active.       Depression Screen    12/26/2022    8:46 AM 08/18/2022    3:06 PM 07/19/2022    2:07 PM 07/10/2022    8:30 AM 06/29/2022   12:10 PM 09/06/2021    9:22 AM 03/30/2020   10:28 AM  PHQ 2/9 Scores  PHQ - 2 Score 0 0 0 0 0 0 0  PHQ- 9 Score  5 2 0 0  0    Fall Risk    12/26/2022    8:48 AM 12/11/2022   10:52 AM 08/18/2022    3:06 PM 07/27/2022    8:10 AM  07/19/2022    2:06 PM  Fall Risk   Falls in the past year? 0 0 0 0 0  Number falls in past yr: 0 0 0 0 0  Injury with Fall? 0 0 0 0 0  Risk for fall due to : No Fall Risks  No Fall Risks  No Fall Risks  Follow up Falls prevention discussed;Falls evaluation completed Falls evaluation completed Falls evaluation completed Falls evaluation completed Falls evaluation completed    MEDICARE RISK AT HOME: Medicare Risk at Home Any stairs in or around the home?: Yes If so, are there any without handrails?: No Home free of loose throw rugs in walkways, pet beds, electrical cords, etc?: Yes Adequate lighting in your home to reduce risk of falls?: Yes Life alert?: No Use of a cane, walker or w/c?: No Grab bars in the bathroom?: Yes Shower chair or bench in shower?: Yes Elevated toilet seat or a handicapped toilet?: Yes  TIMED UP AND GO:  Was the test performed?  No    Cognitive Function:    03/26/2020    2:59 PM 04/11/2017    9:02 AM 10/13/2014   10:20 AM  MMSE - Mini Mental State Exam  Not completed: Refused  Unable to complete  Orientation to time  5   Orientation to Place  5   Registration  3   Attention/ Calculation  0   Recall  3   Language- name 2 objects  0   Language- repeat  1   Language- follow 3 step command  3   Language- read & follow direction  0   Write a sentence  0   Copy design  0   Total score  20         12/26/2022    8:55 AM 09/06/2021    9:27 AM  6CIT Screen  What Year? 0 points 0 points  What month? 0 points 0 points  What time? 0 points 0 points  Count back from 20 0 points 0 points  Months in reverse 0 points 0 points  Repeat phrase 0 points 0 points  Total Score 0 points 0 points    Immunizations Immunization History  Administered Date(s) Administered   Pneumococcal Conjugate-13 05/01/2017    TDAP status: Due, Education has been provided regarding the importance of this vaccine. Advised may receive this vaccine at local pharmacy or Health  Dept. Aware to provide a copy of the vaccination record if obtained from local pharmacy or Health Dept. Verbalized acceptance and understanding.  Flu Vaccine status: Declined, Education has been provided regarding the importance of this vaccine but patient still declined. Advised may receive this vaccine at local pharmacy or Health Dept. Aware to provide a copy of the vaccination record if obtained from local pharmacy or Health Dept. Verbalized acceptance and understanding.  Pneumococcal vaccine status: Declined,  Education has been provided regarding the importance of this vaccine but patient still declined. Advised may receive this vaccine at local pharmacy or Health Dept. Aware to provide a copy of the vaccination record if obtained from local pharmacy or Health Dept. Verbalized acceptance and understanding.   Covid-19 vaccine status: Declined, Education has been  provided regarding the importance of this vaccine but patient still declined. Advised may receive this vaccine at local pharmacy or Health Dept.or vaccine clinic. Aware to provide a copy of the vaccination record if obtained from local pharmacy or Health Dept. Verbalized acceptance and understanding.  Qualifies for Shingles Vaccine? Yes   Zostavax completed      Shingrix Completed?: No.    Education has been provided regarding the importance of this vaccine. Patient has been advised to call insurance company to determine out of pocket expense if they have not yet received this vaccine. Advised may also receive vaccine at local pharmacy or Health Dept. Verbalized acceptance and understanding.  Screening Tests Health Maintenance  Topic Date Due   DTaP/Tdap/Td (1 - Tdap) Never done   Zoster Vaccines- Shingrix (1 of 2) Never done   Pneumonia Vaccine 67+ Years old (2 of 2 - PPSV23 or PCV20) 05/01/2018   INFLUENZA VACCINE  Never done   Medicare Annual Wellness (AWV)  12/26/2023   Hepatitis C Screening  Completed   HPV VACCINES  Aged Out    Colonoscopy  Discontinued   COVID-19 Vaccine  Discontinued    Health Maintenance  Health Maintenance Due  Topic Date Due   DTaP/Tdap/Td (1 - Tdap) Never done   Zoster Vaccines- Shingrix (1 of 2) Never done   Pneumonia Vaccine 56+ Years old (2 of 2 - PPSV23 or PCV20) 05/01/2018   INFLUENZA VACCINE  Never done    Colorectal cancer screening: No longer required.   Lung Cancer Screening: (Low Dose CT Chest recommended if Age 48-80 years, 20 pack-year currently smoking OR have quit w/in 15years.) does not qualify.   Lung Cancer Screening Referral:    Additional Screening:  Hepatitis C Screening: does qualify; Completed 04/17/98  Vision Screening: Recommended annual ophthalmology exams for early detection of glaucoma and other disorders of the eye. Is the patient up to date with their annual eye exam?  Yes  Who is the provider or what is the name of the office in which the patient attends annual eye exams? Dr.Thurmond If pt is not established with a provider, would they like to be referred to a provider to establish care? Yes .   Dental Screening: Recommended annual dental exams for proper oral hygiene  Diabetic Foot Exam:   Community Resource Referral / Chronic Care Management: CRR required this visit?  No   CCM required this visit?  No     Plan:     I have personally reviewed and noted the following in the patient's chart:   Medical and social history Use of alcohol, tobacco or illicit drugs  Current medications and supplements including opioid prescriptions. Patient is not currently taking opioid prescriptions. Functional ability and status Nutritional status Physical activity Advanced directives List of other physicians Hospitalizations, surgeries, and ER visits in previous 12 months Vitals Screenings to include cognitive, depression, and falls Referrals and appointments  In addition, I have reviewed and discussed with patient certain preventive protocols,  quality metrics, and best practice recommendations. A written personalized care plan for preventive services as well as general preventive health recommendations were provided to patient.     Maryan Puls, LPN   1/61/0960   After Visit Summary: (MyChart) Due to this being a telephonic visit, the after visit summary with patients personalized plan was offered to patient via MyChart   Nurse Notes: Vaccinations: declines all Influenza vaccine: recommend every Fall Pneumococcal vaccine: recommend once per lifetime Prevnar-20 Tdap vaccine: recommend every  10 years Shingles vaccine: recommend Shingrix which is 2 doses 2-6 months apart and over 90% effective     Covid-19: recommend 2 doses one month apart with a booster 6 months later

## 2022-12-26 NOTE — Patient Instructions (Signed)
Mr. Pesch , Thank you for taking time to come for your Medicare Wellness Visit. I appreciate your ongoing commitment to your health goals. Please review the following plan we discussed and let me know if I can assist you in the future.   Referrals/Orders/Follow-Ups/Clinician Recommendations: Aim for 30 minutes of exercise or brisk walking, 6-8 glasses of water, and 5 servings of fruits and vegetables each day.   This is a list of the screening recommended for you and due dates:  Health Maintenance  Topic Date Due   DTaP/Tdap/Td vaccine (1 - Tdap) Never done   Zoster (Shingles) Vaccine (1 of 2) Never done   Pneumonia Vaccine (2 of 2 - PPSV23 or PCV20) 05/01/2018   Medicare Annual Wellness Visit  09/07/2022   Flu Shot  Never done   Hepatitis C Screening  Completed   HPV Vaccine  Aged Out   Colon Cancer Screening  Discontinued   COVID-19 Vaccine  Discontinued    Advanced directives: (Copy Requested) Please bring a copy of your health care power of attorney and living will to the office to be added to your chart at your convenience.  Next Medicare Annual Wellness Visit scheduled for next year: Yes

## 2022-12-29 ENCOUNTER — Telehealth: Payer: Self-pay | Admitting: Neurology

## 2022-12-29 NOTE — Telephone Encounter (Signed)
Pt called in stating he has been having restless leg syndrome the last few nights. He says it's keeping him up and he wakes up in a sweat.

## 2023-01-01 NOTE — Telephone Encounter (Signed)
Called patient and left voicemail message.

## 2023-01-02 ENCOUNTER — Other Ambulatory Visit: Payer: Self-pay

## 2023-01-02 DIAGNOSIS — G20A1 Parkinson's disease without dyskinesia, without mention of fluctuations: Secondary | ICD-10-CM

## 2023-01-02 MED ORDER — PRAMIPEXOLE DIHYDROCHLORIDE 0.5 MG PO TABS: 0.5000 mg | ORAL_TABLET | Freq: Three times a day (TID) | ORAL | 0 refills | Status: DC

## 2023-01-02 NOTE — Telephone Encounter (Signed)
RX sent patient called remind me put in for two weeks to check in on patient

## 2023-01-02 NOTE — Telephone Encounter (Signed)
Called this patient left message

## 2023-01-02 NOTE — Telephone Encounter (Signed)
Patient called me back and let me know he is totally off pramipexole and he is not sleeping at all. He needs some help getting asleep and staying asleep without the horrible leg cramps

## 2023-01-02 NOTE — Telephone Encounter (Signed)
Pt called in returning Chelsea's call

## 2023-01-03 DIAGNOSIS — M170 Bilateral primary osteoarthritis of knee: Secondary | ICD-10-CM | POA: Diagnosis not present

## 2023-01-08 ENCOUNTER — Other Ambulatory Visit: Payer: Self-pay | Admitting: Family Medicine

## 2023-01-08 DIAGNOSIS — M461 Sacroiliitis, not elsewhere classified: Secondary | ICD-10-CM | POA: Diagnosis not present

## 2023-01-10 NOTE — Telephone Encounter (Signed)
Sent.  Okay to use as needed.  Would need to get rechecked if this isn't helping.  Thanks.

## 2023-01-10 NOTE — Telephone Encounter (Signed)
Last office visit 09/12/2022 for skin rash.  Last refilled 09/12/2022 for 30 g with no refills.  Next Appt: No future appointments with PCP.

## 2023-01-25 DIAGNOSIS — G20A1 Parkinson's disease without dyskinesia, without mention of fluctuations: Secondary | ICD-10-CM | POA: Diagnosis not present

## 2023-01-25 DIAGNOSIS — N529 Male erectile dysfunction, unspecified: Secondary | ICD-10-CM | POA: Diagnosis not present

## 2023-02-01 ENCOUNTER — Other Ambulatory Visit: Payer: Self-pay

## 2023-02-01 DIAGNOSIS — I10 Essential (primary) hypertension: Secondary | ICD-10-CM

## 2023-02-01 MED ORDER — DILTIAZEM HCL ER COATED BEADS 180 MG PO CP24: 180.0000 mg | ORAL_CAPSULE | Freq: Every day | ORAL | 1 refills | Status: DC

## 2023-02-06 ENCOUNTER — Ambulatory Visit: Payer: PPO | Admitting: Neurology

## 2023-02-21 ENCOUNTER — Other Ambulatory Visit (HOSPITAL_COMMUNITY): Payer: Self-pay

## 2023-03-01 ENCOUNTER — Ambulatory Visit: Payer: PPO | Admitting: Occupational Therapy

## 2023-03-01 ENCOUNTER — Ambulatory Visit: Payer: PPO | Admitting: Physical Therapy

## 2023-03-02 DIAGNOSIS — M48062 Spinal stenosis, lumbar region with neurogenic claudication: Secondary | ICD-10-CM | POA: Diagnosis not present

## 2023-03-06 ENCOUNTER — Other Ambulatory Visit: Payer: Self-pay | Admitting: Neurology

## 2023-03-06 DIAGNOSIS — G20A1 Parkinson's disease without dyskinesia, without mention of fluctuations: Secondary | ICD-10-CM

## 2023-03-07 MED ORDER — RYTARY 36.25-145 MG PO CPCR: ORAL_CAPSULE | ORAL | Status: DC

## 2023-03-09 ENCOUNTER — Telehealth: Payer: Self-pay | Admitting: Neurology

## 2023-03-09 NOTE — Telephone Encounter (Signed)
Caller requesting to speak with [name]. Pt states medications have made him lose his appetite, states he has to force feed himself. Pt would like advise

## 2023-03-09 NOTE — Telephone Encounter (Signed)
Called pateint and he is reaching out to PCP

## 2023-03-19 ENCOUNTER — Telehealth: Payer: Self-pay | Admitting: Family Medicine

## 2023-03-19 DIAGNOSIS — G20A2 Parkinson's disease without dyskinesia, with fluctuations: Secondary | ICD-10-CM | POA: Diagnosis not present

## 2023-03-19 NOTE — Telephone Encounter (Signed)
Patient called in and wanted to know if he could stop taking diltiazem (CARDIZEM CD) 180 MG 24 hr capsule and go back on Propranolol. He stated that the diltiazem (CARDIZEM CD) 180 MG 24 hr capsule  causes him to bruise. Please advise. Thank you!

## 2023-03-20 ENCOUNTER — Telehealth: Payer: Self-pay | Admitting: Pharmacy Technician

## 2023-03-20 ENCOUNTER — Other Ambulatory Visit (HOSPITAL_COMMUNITY): Payer: Self-pay

## 2023-03-20 NOTE — Telephone Encounter (Signed)
Pharmacy Patient Advocate Encounter   Received notification from Fax that prior authorization for RYTARY 195MG  is required/requested.   Insurance verification completed.   The patient is insured through [facility] ADVANTAGE/RX ADVANCE .   Per test claim: PA required and submitted KEY/EOC/Request #: [id number] CANCELLED due to  Patient has a current PA active until 12.31.24. Will need to submit closer to end of year, first of new year.

## 2023-03-20 NOTE — Telephone Encounter (Signed)
Patient would like a response re: question below  Please call the patient at [phone]

## 2023-03-20 NOTE — Telephone Encounter (Signed)
I wouldn't expect either med to cause bruising.  Can he come in for OV to check his labs and discuss options prior to med change?  I would suggest that.  Thanks.

## 2023-03-21 NOTE — Telephone Encounter (Signed)
Spoke with patient, advised of Dr. Lianne Bushy message below. Patient made appt for 12/6 to discuss this.

## 2023-03-23 ENCOUNTER — Encounter: Payer: Self-pay | Admitting: Family Medicine

## 2023-03-23 ENCOUNTER — Ambulatory Visit (INDEPENDENT_AMBULATORY_CARE_PROVIDER_SITE_OTHER): Payer: PPO | Admitting: Family Medicine

## 2023-03-23 VITALS — BP 116/68 | HR 59 | Temp 98.2°F | Ht 75.0 in | Wt 196.2 lb

## 2023-03-23 DIAGNOSIS — R432 Parageusia: Secondary | ICD-10-CM | POA: Diagnosis not present

## 2023-03-23 DIAGNOSIS — G20A1 Parkinson's disease without dyskinesia, without mention of fluctuations: Secondary | ICD-10-CM

## 2023-03-23 DIAGNOSIS — R233 Spontaneous ecchymoses: Secondary | ICD-10-CM

## 2023-03-23 DIAGNOSIS — Z7185 Encounter for immunization safety counseling: Secondary | ICD-10-CM

## 2023-03-23 LAB — COMPREHENSIVE METABOLIC PANEL
ALT: 9 U/L (ref 0–53)
AST: 17 U/L (ref 0–37)
Albumin: 4.4 g/dL (ref 3.5–5.2)
Alkaline Phosphatase: 74 U/L (ref 39–117)
BUN: 13 mg/dL (ref 6–23)
CO2: 30 meq/L (ref 19–32)
Calcium: 9.2 mg/dL (ref 8.4–10.5)
Chloride: 96 meq/L (ref 96–112)
Creatinine, Ser: 1.08 mg/dL (ref 0.40–1.50)
GFR: 66.53 mL/min (ref >60.00)
Glucose, Bld: 92 mg/dL (ref 70–99)
Potassium: 4.1 meq/L (ref 3.5–5.1)
Sodium: 132 meq/L — ABNORMAL LOW (ref 135–145)
Total Bilirubin: 0.7 mg/dL (ref 0.2–1.2)
Total Protein: 7.2 g/dL (ref 6.0–8.3)

## 2023-03-23 LAB — CBC WITH DIFFERENTIAL/PLATELET
Basophils Absolute: 0.1 10*3/uL (ref 0.0–0.1)
Basophils Relative: 0.6 % (ref 0.0–3.0)
Eosinophils Absolute: 0.1 10*3/uL (ref 0.0–0.7)
Eosinophils Relative: 1.3 % (ref 0.0–5.0)
HCT: 43.1 % (ref 39.0–52.0)
Hemoglobin: 15 g/dL (ref 13.0–17.0)
Lymphocytes Relative: 15.1 % (ref 12.0–46.0)
Lymphs Abs: 1.6 10*3/uL (ref 0.7–4.0)
MCHC: 34.7 g/dL (ref 30.0–36.0)
MCV: 92.2 fL (ref 78.0–100.0)
Monocytes Absolute: 0.8 10*3/uL (ref 0.1–1.0)
Monocytes Relative: 7.7 % (ref 3.0–12.0)
Neutro Abs: 7.8 10*3/uL — ABNORMAL HIGH (ref 1.4–7.7)
Neutrophils Relative %: 75.3 % (ref 43.0–77.0)
Platelets: 284 10*3/uL (ref 150.0–400.0)
RBC: 4.67 Mil/uL (ref 4.22–5.81)
RDW: 13.5 % (ref 11.5–15.5)
WBC: 10.4 10*3/uL (ref 4.0–10.5)

## 2023-03-23 LAB — APTT: aPTT: 28.8 s (ref 25.4–36.8)

## 2023-03-23 LAB — PROTIME-INR
INR: 1.1 {ratio} — ABNORMAL HIGH (ref 0.8–1.0)
Prothrombin Time: 11.6 s (ref 9.6–13.1)

## 2023-03-23 MED ORDER — POTASSIUM CHLORIDE ER 10 MEQ PO TBCR: 10.0000 meq | EXTENDED_RELEASE_TABLET | Freq: Two times a day (BID) | ORAL | Status: DC

## 2023-03-23 NOTE — Progress Notes (Unsigned)
He was using potassium as needed for cramps w/o recent need/use.  Usually an issue for patient in summertime/hot weather.    History of Parkinson's.  Tremor improved with treatment per outside clinic in the meantime.  Discussed.  Still compliant with extended release Sinemet.  D/w pt about bruising.  Still on diltiazem.  Had been off propranolol until recent use for hand tremor.    Easy bruising on the arms.  No gum bleeding.  No blood seen in urine or stools. No hemoptysis.  No bleeding.  Bruising usually on the extensor side of the arms.  Bruising started about 1 year ago.    He was asking about transition from diltiazem to propranolol 20mg  BID.  Had seen Dr. Genelle Bal.    RSV and flu shot d/w pt. Routine vaccines encouraged and discussed.  He had noted a change in sensation of taste- altered but not absent.  He had dec in appetite.  No metallic taste in mouth.  Going on for about 8 months.    Meds, vitals, and allergies reviewed.   ROS: Per HPI unless specifically indicated in ROS section   Nad Ncat Neck supple, no LA No oral changes seen or lesions noted.  MMM.   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

## 2023-03-23 NOTE — Patient Instructions (Signed)
Go to the lab on the way out.   If you have mychart we'll likely use that to update you.   Let me see about options after I get your labs.   Take care.  Glad to see you.

## 2023-03-25 ENCOUNTER — Other Ambulatory Visit: Payer: Self-pay | Admitting: Family Medicine

## 2023-03-25 DIAGNOSIS — R233 Spontaneous ecchymoses: Secondary | ICD-10-CM | POA: Insufficient documentation

## 2023-03-25 DIAGNOSIS — R432 Parageusia: Secondary | ICD-10-CM | POA: Insufficient documentation

## 2023-03-25 DIAGNOSIS — Z7185 Encounter for immunization safety counseling: Secondary | ICD-10-CM | POA: Insufficient documentation

## 2023-03-25 MED ORDER — PROPRANOLOL HCL 20 MG PO TABS: 20.0000 mg | ORAL_TABLET | Freq: Two times a day (BID) | ORAL | Status: DC

## 2023-03-25 NOTE — Assessment & Plan Note (Signed)
Without obvious cause seen.  I asked him to monitor this in the meantime.

## 2023-03-25 NOTE — Progress Notes (Signed)
I am fine with the change. Thank you and hope you are well

## 2023-03-25 NOTE — Assessment & Plan Note (Signed)
See above

## 2023-03-25 NOTE — Assessment & Plan Note (Signed)
See notes on labs.  I would not expect this to be related to diltiazem but he was asking about potentially making this change.  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  At this point okay for outpatient follow-up.

## 2023-03-25 NOTE — Assessment & Plan Note (Addendum)
Fortunately his tremor improved the meantime.  He has neurology follow-up pending.  Continue Sinemet for now.

## 2023-04-01 ENCOUNTER — Other Ambulatory Visit: Payer: Self-pay | Admitting: Neurology

## 2023-04-01 DIAGNOSIS — G20A1 Parkinson's disease without dyskinesia, without mention of fluctuations: Secondary | ICD-10-CM

## 2023-04-02 ENCOUNTER — Other Ambulatory Visit: Payer: Self-pay

## 2023-04-02 MED ORDER — PROPRANOLOL HCL 20 MG PO TABS: 20.0000 mg | ORAL_TABLET | Freq: Two times a day (BID) | ORAL | 1 refills | Status: DC

## 2023-04-03 ENCOUNTER — Other Ambulatory Visit: Payer: Self-pay

## 2023-04-03 MED ORDER — PROPRANOLOL HCL 20 MG PO TABS: 20.0000 mg | ORAL_TABLET | Freq: Two times a day (BID) | ORAL | 0 refills | Status: DC

## 2023-04-04 DIAGNOSIS — M25511 Pain in right shoulder: Secondary | ICD-10-CM | POA: Diagnosis not present

## 2023-04-12 DIAGNOSIS — M461 Sacroiliitis, not elsewhere classified: Secondary | ICD-10-CM | POA: Diagnosis not present

## 2023-05-22 ENCOUNTER — Other Ambulatory Visit: Payer: Self-pay

## 2023-05-22 MED ORDER — POTASSIUM CHLORIDE ER 10 MEQ PO TBCR: 10.0000 meq | EXTENDED_RELEASE_TABLET | Freq: Two times a day (BID) | ORAL | 0 refills | Status: DC

## 2023-05-24 DIAGNOSIS — Z7689 Persons encountering health services in other specified circumstances: Secondary | ICD-10-CM | POA: Diagnosis not present

## 2023-05-24 DIAGNOSIS — G20B1 Parkinson's disease with dyskinesia, without mention of fluctuations: Secondary | ICD-10-CM | POA: Diagnosis not present

## 2023-05-24 DIAGNOSIS — I482 Chronic atrial fibrillation, unspecified: Secondary | ICD-10-CM | POA: Diagnosis not present

## 2023-05-24 DIAGNOSIS — G2581 Restless legs syndrome: Secondary | ICD-10-CM | POA: Diagnosis not present

## 2023-05-24 DIAGNOSIS — K219 Gastro-esophageal reflux disease without esophagitis: Secondary | ICD-10-CM | POA: Diagnosis not present

## 2023-06-18 ENCOUNTER — Telehealth: Payer: Self-pay | Admitting: Family Medicine

## 2023-06-18 NOTE — Telephone Encounter (Signed)
 Returned call to patient and provided the number to medical records

## 2023-06-18 NOTE — Telephone Encounter (Signed)
 Spoke with patient and he stated that he is needing any type of records regarding symptoms from the CPAP machine he was using. He stated that this is a class action law suit and he need the documentation for that. He stated any records regarding him coughing, runny nose or etc that the CPAP could have caused. He can be reached at [phone]. Thank you!  Copied from CRM [id number]. Topic: Medical Record Request - Records Request >> Jun 15, 2023  4:16 PM Adaysia C wrote: Reason for CRM: Patient is requesting medical records that will support his current law suit. Please follow up with patient about the proper paper work he needs [phone]

## 2023-06-19 ENCOUNTER — Telehealth: Payer: Self-pay

## 2023-06-19 NOTE — Telephone Encounter (Signed)
 PA needed for Rytary 48.75 mg.

## 2023-06-20 ENCOUNTER — Other Ambulatory Visit (HOSPITAL_COMMUNITY): Payer: Self-pay

## 2023-06-20 ENCOUNTER — Telehealth: Payer: Self-pay

## 2023-06-20 NOTE — Telephone Encounter (Signed)
 Per Dr.Tat, Yes.  I think 145 mg tid but I am not sure.  Confirm with pt     Per patient he is taking 145 mg BID.

## 2023-06-20 NOTE — Telephone Encounter (Signed)
 Pharmacy Patient Advocate Encounter   Received notification from Pt Calls Messages that prior authorization for Rytary 36.25-145MG  er capsules  is required/requested.   Insurance verification completed.   The patient is insured through [facility] ADVANTAGE/RX ADVANCE .   Per test claim: PA required; PA started via CoverMyMeds. KEY [id number] . Please see clinical question(s) below that I am not finding the answer to in her chart and advise.   Has the patient tried and failed any other formulary alternatives?*  Please specify the medication(s)  No (please provide clinical rationale for the request)

## 2023-06-20 NOTE — Telephone Encounter (Signed)
*  LBN  Prior Authorization form/request asks a question that requires your assistance. Please see the question below and advise accordingly. The PA will not be submitted until the necessary information is received.   Pending answer for correct dose.   CMM Key for 48.75-195mg  : [facility]

## 2023-06-20 NOTE — Telephone Encounter (Signed)
 I will move forward with 145mg  per chart, thank you!

## 2023-06-20 NOTE — Telephone Encounter (Signed)
 Pt called in stating he has 3 tablets left of his Rytary. He is wanting to find out what he is supposed to do when he runs out?

## 2023-06-20 NOTE — Telephone Encounter (Signed)
 PA request has been Submitted. New Encounter has been or will be created for follow up. For additional info see Pharmacy Prior Auth telephone encounter from 03/05.

## 2023-06-21 ENCOUNTER — Telehealth: Payer: Self-pay | Admitting: Neurology

## 2023-06-21 NOTE — Telephone Encounter (Signed)
 The pt called in again. We are waiting on the PA for the Rytary. He will be taking his last pill today and wants to make sure it's ok to just stop taking it?

## 2023-06-22 NOTE — Telephone Encounter (Signed)
 Patient advised of DR.Tat, I don't want him to d/c it.  Does he have regular CR levodopa at home?  Also, tell him he needs to call us sooner before running out next time.   Unfortunately he doesn't have the CR as well. Per patient he dint' think he would need it.   Patient is in [place] until April 1 st

## 2023-06-22 NOTE — Telephone Encounter (Signed)
 LMOVM for patient,  We can call the regular CR into pharmacy if he wants until rytary approved

## 2023-06-22 NOTE — Telephone Encounter (Signed)
 Need answer to question below:   Has the patient tried and failed any other formulary alternatives?*   Please specify the medication(s)   No (please provide clinical rationale for the request)

## 2023-06-25 MED ORDER — CARBIDOPA-LEVODOPA ER 25-100 MG PO TBCR: EXTENDED_RELEASE_TABLET | ORAL | 0 refills | Status: DC

## 2023-06-25 NOTE — Telephone Encounter (Signed)
 Per Patient, it will be okay to send in the CR for now.   Advised patient of PA note 06/20/23, PA request has been Submitted. New Encounter has been or will be created for follow up. For additional info see Pharmacy Prior Auth telephone encounter from 03/05.     Per Dr.Tat  carbidopa/levodopa 25/100 CR, carbidopa/levodopa 25/100 CR, 1.5 tablets tid.  Make sure he has a f/u appt tid.  Make sure he has a f/u appt

## 2023-06-27 NOTE — Telephone Encounter (Signed)
 Received notification today that his PA has been approved paperwork sent to scan for media

## 2023-07-02 ENCOUNTER — Other Ambulatory Visit: Payer: Self-pay

## 2023-07-02 ENCOUNTER — Other Ambulatory Visit: Payer: Self-pay | Admitting: Cardiology

## 2023-07-02 ENCOUNTER — Other Ambulatory Visit (HOSPITAL_COMMUNITY): Payer: Self-pay

## 2023-07-02 MED ORDER — PROPRANOLOL HCL 20 MG PO TABS
20.0000 mg | ORAL_TABLET | Freq: Two times a day (BID) | ORAL | 0 refills | Status: DC
  Filled 2023-07-02: qty 180, 90d supply, fill #0

## 2023-07-08 DIAGNOSIS — R531 Weakness: Secondary | ICD-10-CM | POA: Diagnosis not present

## 2023-07-08 DIAGNOSIS — I10 Essential (primary) hypertension: Secondary | ICD-10-CM | POA: Diagnosis not present

## 2023-07-13 ENCOUNTER — Other Ambulatory Visit (HOSPITAL_COMMUNITY): Payer: Self-pay

## 2023-07-20 ENCOUNTER — Encounter: Payer: Self-pay | Admitting: Family Medicine

## 2023-07-20 ENCOUNTER — Ambulatory Visit: Admitting: Family Medicine

## 2023-07-20 VITALS — BP 122/70 | HR 87 | Temp 98.5°F | Ht 75.0 in | Wt 195.2 lb

## 2023-07-20 DIAGNOSIS — R110 Nausea: Secondary | ICD-10-CM

## 2023-07-20 DIAGNOSIS — G20A1 Parkinson's disease without dyskinesia, without mention of fluctuations: Secondary | ICD-10-CM

## 2023-07-20 LAB — COMPREHENSIVE METABOLIC PANEL WITH GFR
ALT: 7 U/L (ref 0–53)
AST: 19 U/L (ref 0–37)
Albumin: 4.4 g/dL (ref 3.5–5.2)
Alkaline Phosphatase: 66 U/L (ref 39–117)
BUN: 9 mg/dL (ref 6–23)
CO2: 28 meq/L (ref 19–32)
Calcium: 9 mg/dL (ref 8.4–10.5)
Chloride: 92 meq/L — ABNORMAL LOW (ref 96–112)
Creatinine, Ser: 1.08 mg/dL (ref 0.40–1.50)
GFR: 66.38 mL/min (ref >60.00)
Glucose, Bld: 92 mg/dL (ref 70–99)
Potassium: 4.3 meq/L (ref 3.5–5.1)
Sodium: 129 meq/L — ABNORMAL LOW (ref 135–145)
Total Bilirubin: 0.7 mg/dL (ref 0.2–1.2)
Total Protein: 6.5 g/dL (ref 6.0–8.3)

## 2023-07-20 LAB — CBC WITH DIFFERENTIAL/PLATELET
Basophils Absolute: 0 10*3/uL (ref 0.0–0.1)
Basophils Relative: 0.4 % (ref 0.0–3.0)
Eosinophils Absolute: 0.1 10*3/uL (ref 0.0–0.7)
Eosinophils Relative: 1.8 % (ref 0.0–5.0)
HCT: 40 % (ref 39.0–52.0)
Hemoglobin: 13.6 g/dL (ref 13.0–17.0)
Lymphocytes Relative: 18.3 % (ref 12.0–46.0)
Lymphs Abs: 1.3 10*3/uL (ref 0.7–4.0)
MCHC: 34 g/dL (ref 30.0–36.0)
MCV: 91.3 fl (ref 78.0–100.0)
Monocytes Absolute: 0.7 10*3/uL (ref 0.1–1.0)
Monocytes Relative: 9.7 % (ref 3.0–12.0)
Neutro Abs: 5.1 10*3/uL (ref 1.4–7.7)
Neutrophils Relative %: 69.8 % (ref 43.0–77.0)
Platelets: 264 10*3/uL (ref 150.0–400.0)
RBC: 4.38 Mil/uL (ref 4.22–5.81)
RDW: 13.1 % (ref 11.5–15.5)
WBC: 7.3 10*3/uL (ref 4.0–10.5)

## 2023-07-20 LAB — TSH: TSH: 3.25 u[IU]/mL (ref 0.35–5.50)

## 2023-07-20 MED ORDER — CARBIDOPA-LEVODOPA ER 48.75-195 MG PO CPCR: 1.0000 | ORAL_CAPSULE | Freq: Three times a day (TID) | ORAL | Status: DC

## 2023-07-20 NOTE — Patient Instructions (Addendum)
 Go to the lab on the way out.   If you have mychart we'll likely use that to update you.    Take care.  Glad to see you. Ask for update from Dr. Arbutus Leas when you go to that visit.

## 2023-07-20 NOTE — Progress Notes (Signed)
 Prev was on sinemet ER 25/100, taking 1.5 tabs 3 times per day.   Recently changed to sinemet ER 48.75/195, 1 tab 3 times per day.   He had appetite changes over the last 4 weeks.  This predates the medication change above.  No vomiting.  Some nausea.  Some episodic diarrhea.  No blood in stools.  No fevers.  No abd pain.  Not SOB.  No CP.    He is back from [place] recently, here for the next 9 months.   Down about 10 lbs in the last ~11 months.    He is off propranolol and diltiazem but still having some bruising. BP controlled.  No heart racing.    He doesn't have a tremor now.   Meds, vitals, and allergies reviewed.   ROS: Per HPI unless specifically indicated in ROS section   GEN: nad, alert and oriented HEENT: ncat NECK: supple w/o LA CV: rrr.  PULM: ctab, no inc wob ABD: soft, +bs EXT: no edema SKIN: Well-perfused. No tremor seen.

## 2023-07-22 ENCOUNTER — Other Ambulatory Visit: Payer: Self-pay | Admitting: Family Medicine

## 2023-07-22 DIAGNOSIS — E871 Hypo-osmolality and hyponatremia: Secondary | ICD-10-CM

## 2023-07-22 NOTE — Assessment & Plan Note (Addendum)
 History of, with decrease in appetite over the last few weeks.  He has history of weight loss that predates that.  Recheck labs today pending.  Discussed.  Unclear if his symptoms are related to his Sinemet use.  I need input from Dr. Arbutus Leas about this.  It would not be due to the most recent change to the 48/195 dose as that was very recent.  At this point still okay for outpatient follow-up.

## 2023-07-24 ENCOUNTER — Ambulatory Visit: Payer: Self-pay

## 2023-07-24 NOTE — Telephone Encounter (Signed)
  Chief Complaint: lab results, weight loss Symptoms: weight loss, no appetite Frequency: constant Pertinent Negatives: Patient denies other symptoms Disposition: [] ED /[] Urgent Care (no appt availability in office) / [] Appointment(In office/virtual)/ []  [facility]/ [] Home Care/ [] Refused Recommended Disposition /[] [place] [facility]/ [x]  Follow-up with PCP Additional Notes:  Caller transferred to nurse triage to discuss note in lab results. Reviewed and read notes from Dr. Para March, [facility] reports he is trying to increase his sodium intake but does not care for the taste of salt. He reports he still has no appetite which is causing him to loose weight. He states when he sees food in front of him he becomes overwhelmed, nauseous, and aversion. He is taking his Carbidopa-Levodopa ER daily as prescribed. Follow up with PCP advised. Educated on care advice as documented in protocol, patient verbalized understanding.      Copied from CRM [id number]. Topic: Clinical - Lab/Test Results >> Jul 24, 2023 10:28 AM [place] E wrote: Reason for CRM: Patient has further questions about his lab results, relayed to patient the note that PCP attached to results. Reason for Disposition  [1] Continued weight loss AND [2] after medical evaluation by doctor (or NP/PA)  Protocols used: Weight Loss - Unintended-A-AH

## 2023-07-25 NOTE — Telephone Encounter (Signed)
 Please update patient.  Dr. Arbutus Leas didn't think his sx were med/parkinson related.  I would get the follow up labs and go from there.  Thanks.

## 2023-07-26 ENCOUNTER — Telehealth: Payer: Self-pay | Admitting: Neurology

## 2023-07-26 ENCOUNTER — Other Ambulatory Visit: Payer: Self-pay

## 2023-07-26 NOTE — Telephone Encounter (Signed)
 Pt called in stating he is really weak with no appetite and having diarrhea for days. He is wanting to see if he can get off of the carbidopa-levodopa until he can come see Dr. Arbutus Leas on 07/31/23?

## 2023-07-26 NOTE — Telephone Encounter (Signed)
 Called patient and gave recommendation he is going to D/C carbidopa levodopa

## 2023-07-26 NOTE — Telephone Encounter (Signed)
 Left message for patient per DPR to advised that Dr. Arbutus Leas does not suspect symptoms are med/parkinson related. Patient is scheduled to see her on 07/31/2023

## 2023-07-29 NOTE — Progress Notes (Unsigned)
 Assessment/Plan:   1.  Parkinsons Disease with levodopa resistant tremor   -Patient is status post focused ultrasound to the left VIM at [facility] on November 29, 2022.  This certainly helped his levodopa resistant tremor, but unfortunately he had some balance difficulties after.  This has improved dramatically and patient continues to improve from that regard  -Patient was previously hoping to taper the medication.  I reminded him that he still had Parkinsons disease, which is much more than tremor.    -Discussed that pramipexole can cause EDS but it increases appetite so I doubt this is the culprit but we will hold pramipexole on Friday/satu/Sunday and we will call on monday  -restart carbidopa/levodopa ER, 1.5 tid.  I will try to get auth on Rytary to restart that 145 mg 3 times per day.   2.  Atrial fibrillation hx  -off Xarelto for quite some time due to no recurrence of a-fib, which occurred post op.  Follows with Dr. Berry Bristol  3.  Insomnia  -Continue mirtazapine, 15 mg at bed   4.  Back pain  -Sees Dr. Larrie Po. Pt had surgery in July, 2023  -Patient stated Dr. Larrie Po told him that he thought he had neuropathy as well.  We would do EMG.  -Patient with intermittent paresthesias in the feet.  Depending on the results of the EMG, he is to think about whether or not he really wants medication for that.  5.  Nausea, abd pain, decreased appetite  -looking at chart he has had these sx's for a long time and was hospitalized a year ago for similar s/s.  I highly doubt related to Parkinsons Disease meds.  I would recommend GI work up at this time.  I will send a referral.  They prefer [facility].     Subjective:   [name] was seen today in follow up for Parkinsons disease.  My previous records were reviewed prior to todays visit as well as outside records available to me.   Wife with patient who supplements the history.  Last visit, the patient wanted to get off of the pramipexole and  decrease the levodopa.  We trialed that, but unfortunately his restless leg came back significantly and he ended up going back on his medication not long thereafter (September).  He called at the end of November stating that his appetite was very poor and he wondered if it was from his medications.  We told him that would not be the case (and in fact pramipexole can cause an increase in appetite as does mirtazapine).  He saw his primary care physician in December and mentioned the decreased appetite, but they decided to take a wait-and-see approach at that time.  He followed up again with his primary care physician in early April.  His primary care physician reached out to me and asked me if his change in medication caused decreased appetite.  I have not changed his medication in nearly a year, so I told him that was unrelated.  The patient called me a few days later and stated that he was having nausea, diarrhea, decreased appetite change and wondered if it was from the levodopa.  I told him that was not, but he wanted to hold it until today's visit he certainly could do that.  Looking back at his chart, the patient was actually hospitalized in March, 2024 for some similar complaints.  He had a CT of the abdomen at that time that was fairly  unremarkable, with the exception of the distended colon.  He reports today being off of levodopa (this is the ER levodopa).  He feels more rigid.  His appetite is still poor but maybe not quite as poor).  He reports no trouble with swallowing.  He still has no energy.    Current prescribed movement disorder medications: Pramipexole 0.5 mg three times per day Rytary 195 mg, 1 capsule 3 times per day Mirtazapine, 15 mg at bed Propranolol 20 mg bid  PREVIOUS MEDICATIONS: Levodopa IR (nausea); carbidopa/levodopa 25/100 CR (nausea)  ALLERGIES:   Allergies  Allergen Reactions   Flexeril [Cyclobenzaprine] Other (See Comments)    Constipation   Hctz [Hydrochlorothiazide]  Other (See Comments)    Hyponatremia    Morphine And Codeine Hives and Other (See Comments)    Hallucinations Can tolerate oxycodone   Phenergan [Promethazine] Other (See Comments)    Told to avoid due to Parkinson's   Sinemet [Carbidopa-Levodopa] Other (See Comments)    Intolerant of short acting form   Tambocor [Flecainide] Nausea And Vomiting    CURRENT MEDICATIONS:  Outpatient Encounter Medications as of 07/31/2023  Medication Sig   acetaminophen (TYLENOL) 500 MG tablet Take 1,000 mg by mouth daily as needed for moderate pain.   cetirizine (ZYRTEC) 10 MG tablet TAKE 1 TABLET BY MOUTH EVERY DAY   cyanocobalamin (VITAMIN B12) 1000 MCG tablet Take 1,000 mcg by mouth daily.   diclofenac Sodium (VOLTAREN) 1 % GEL Apply 1 Application topically 4 (four) times daily as needed (pain.).   docusate sodium (COLACE) 100 MG capsule Take 1 capsule (100 mg total) by mouth 2 (two) times daily.   fluticasone (FLONASE) 50 MCG/ACT nasal spray Place 2 sprays into both nostrils daily.   Multiple Vitamins-Minerals (MULTIVITAMIN MEN 50+) TABS Take 1 tablet by mouth daily.   omeprazole (PRILOSEC OTC) 20 MG tablet Take 20 mg by mouth daily as needed (heartburn).   potassium chloride (KLOR-CON) 10 MEQ tablet Take 1 tablet (10 mEq total) by mouth 2 (two) times daily. As needed for cramps.   pramipexole (MIRAPEX) 0.5 MG tablet TAKE 1 TABLET BY MOUTH 3 TIMES DAILY.   sildenafil (REVATIO) 20 MG tablet Take 20-100 mg by mouth daily as needed.   triamcinolone cream (KENALOG) 0.1 % APPLY TOPICALLY 2 (TWO) TIMES DAILY AS NEEDED TO AFFECTED AREA   No facility-administered encounter medications on file as of 07/31/2023.    Objective:   PHYSICAL EXAMINATION:    VITALS:   Vitals:   07/31/23 0825  BP: 124/86  Pulse: 86  SpO2: 99%  Weight: 195 lb 12.8 oz (88.8 kg)  Height: 6\' 1"  (1.854 m)   Wt Readings from Last 3 Encounters:  07/31/23 195 lb 12.8 oz (88.8 kg)  07/20/23 195 lb 3.2 oz (88.5 kg)  03/23/23 196  lb 3.2 oz (89 kg)      GEN:  The patient appears stated age and is in NAD. HEENT:  Normocephalic, atraumatic.  The mucous membranes are moist. The superficial temporal arteries are without ropiness or tenderness. Cv:  RRR Lungs:  CTAB  Neurological examination:  Orientation: The patient is alert and oriented x3. Cranial nerves: There is good facial symmetry with facial hypomimia. The speech is fluent and clear. Soft palate rises symmetrically and there is no tongue deviation. Hearing is intact to conversational tone. Sensation: Sensation is intact to light touch throughout.  Vibration is slightly decreased distally.  No extinction with dss Motor: Strength is at least antigravity x4.   Movement examination:  Tone: There is nl tone in the UE/LE Abnormal movements:no rest tremor Coordination:  There is mild decremation on the R and with toe taps bilaterally.   Gait and Station: The patient pushes off to arise. The patient's stride length is decreased and he is forward flexed (he is having back pain).  He is shuffling  I have reviewed and interpreted the following labs independently  Lab Results  Component Value Date   VITAMINB12 688 12/30/2020       Chemistry      Component Value Date/Time   NA 129 (L) 07/20/2023 1111   NA 139 07/01/2020 1021   K 4.3 07/20/2023 1111   CL 92 (L) 07/20/2023 1111   CO2 28 07/20/2023 1111   BUN 9 07/20/2023 1111   BUN 12 07/01/2020 1021   CREATININE 1.08 07/20/2023 1111   CREATININE 1.06 01/20/2022 0858      Component Value Date/Time   CALCIUM 9.0 07/20/2023 1111   ALKPHOS 66 07/20/2023 1111   AST 19 07/20/2023 1111   ALT 7 07/20/2023 1111   BILITOT 0.7 07/20/2023 1111       Lab Results  Component Value Date   WBC 7.3 07/20/2023   HGB 13.6 07/20/2023   HCT 40.0 07/20/2023   MCV 91.3 07/20/2023   PLT 264.0 07/20/2023    Lab Results  Component Value Date   TSH 3.25 07/20/2023   Lab Results  Component Value Date   ESRSEDRATE  14 01/20/2022    Lab Results  Component Value Date   IRON 58 08/04/2022   FERRITIN 53.0 08/04/2022    Total time spent on today's visit was 40 minutes, including both face-to-face time and nonface-to-face time.  Time included that spent on review of records (prior notes available to me/labs/imaging if pertinent), discussing treatment and goals, answering patient's questions and coordinating care.   Cc:  Gabriell Galea, MD

## 2023-07-31 ENCOUNTER — Other Ambulatory Visit: Payer: Self-pay

## 2023-07-31 ENCOUNTER — Telehealth: Payer: Self-pay | Admitting: Pharmacy Technician

## 2023-07-31 ENCOUNTER — Other Ambulatory Visit (HOSPITAL_COMMUNITY): Payer: Self-pay

## 2023-07-31 ENCOUNTER — Telehealth: Payer: Self-pay

## 2023-07-31 ENCOUNTER — Ambulatory Visit: Payer: PPO | Admitting: Cardiology

## 2023-07-31 ENCOUNTER — Encounter: Payer: Self-pay | Admitting: Neurology

## 2023-07-31 ENCOUNTER — Ambulatory Visit: Payer: PPO | Admitting: Neurology

## 2023-07-31 VITALS — BP 124/86 | HR 86 | Ht 73.0 in | Wt 195.8 lb

## 2023-07-31 DIAGNOSIS — R1084 Generalized abdominal pain: Secondary | ICD-10-CM | POA: Diagnosis not present

## 2023-07-31 DIAGNOSIS — R110 Nausea: Secondary | ICD-10-CM

## 2023-07-31 DIAGNOSIS — G20A1 Parkinson's disease without dyskinesia, without mention of fluctuations: Secondary | ICD-10-CM | POA: Diagnosis not present

## 2023-07-31 DIAGNOSIS — R634 Abnormal weight loss: Secondary | ICD-10-CM

## 2023-07-31 DIAGNOSIS — R197 Diarrhea, unspecified: Secondary | ICD-10-CM

## 2023-07-31 NOTE — Telephone Encounter (Signed)
 PA has been submitted, and telephone encounter has been created. Please see telephone encounter dated 4.15.25.

## 2023-07-31 NOTE — Patient Instructions (Addendum)
 Take carbidopa/levodopa 25/100 ER, 1.5 tablets three times per day ON FRIDAY, SATURDAY, SUNDAY, HOLD YOUR pramipexole.  We will call you on Monday.  We will get you a referral to GI [facility] [address], [address] [place], [place] [address]  Call Us  Call: [phone]  Fax: [phone] Our Hours Monday-Friday 8am-5pm

## 2023-07-31 NOTE — Telephone Encounter (Signed)
 Pharmacy Patient Advocate Encounter  Received notification from [facility] [employer] that Prior Authorization for RYTARY 36.25-145MG  has been APPROVED from 3.12.25 to 12.31.25. Ran test claim, Copay is $100. This test claim was processed through [facility]- copay amounts may vary at other pharmacies due to pharmacy/plan contracts, or as the patient moves through the different stages of their insurance plan.

## 2023-07-31 NOTE — Telephone Encounter (Signed)
 Error

## 2023-07-31 NOTE — Telephone Encounter (Signed)
 Pharmacy Patient Advocate Encounter   Received notification from Pt Calls Messages that prior authorization for RYTARY 36.25-145MG  is required/requested.   Insurance verification completed.   The patient is insured through [facility] ADVANTAGE/RX ADVANCE .   Per test claim: PA required; PA submitted to above mentioned insurance via CoverMyMeds Key/confirmation #/EOC [id number] Status is pending

## 2023-07-31 NOTE — Telephone Encounter (Signed)
 This patient needs a PA for Rytary 145 mg taken 3 times a day

## 2023-08-06 ENCOUNTER — Telehealth: Payer: Self-pay | Admitting: Neurology

## 2023-08-06 NOTE — Telephone Encounter (Signed)
Called patient and gave Dr. Tats recommendations  

## 2023-08-06 NOTE — Telephone Encounter (Signed)
 Called patient and asked how he was doing. Patient said that he tried but the RLS ws so bad he had to take at least one pill and it wasn't great but definitely helped to take the pramipexole 

## 2023-08-06 NOTE — Telephone Encounter (Signed)
-----   Message from [place] [name] sent at 07/31/2023  9:08 AM EDT ----- Call patient and see how he is doing over the weekend off of pramipexole

## 2023-08-13 ENCOUNTER — Other Ambulatory Visit: Payer: Self-pay | Admitting: Nurse Practitioner

## 2023-08-13 DIAGNOSIS — R634 Abnormal weight loss: Secondary | ICD-10-CM | POA: Diagnosis not present

## 2023-08-13 DIAGNOSIS — R197 Diarrhea, unspecified: Secondary | ICD-10-CM | POA: Diagnosis not present

## 2023-08-15 DIAGNOSIS — R197 Diarrhea, unspecified: Secondary | ICD-10-CM | POA: Diagnosis not present

## 2023-08-16 ENCOUNTER — Ambulatory Visit
Admission: RE | Admit: 2023-08-16 | Discharge: 2023-08-16 | Disposition: A | Discharge status: Home / Self Care | Source: Ambulatory Visit | Attending: Nurse Practitioner | Admitting: Nurse Practitioner

## 2023-08-16 DIAGNOSIS — R634 Abnormal weight loss: Secondary | ICD-10-CM | POA: Diagnosis not present

## 2023-08-16 MED ORDER — IOPAMIDOL (ISOVUE-300) INJECTION 61%
100.0000 mL | Freq: Once | INTRAVENOUS | Status: AC | PRN
  Administered 2023-08-16: 100 mL via INTRAVENOUS

## 2023-08-16 MED ORDER — IOPAMIDOL (ISOVUE-300) INJECTION 61%: 100.0000 mL | Freq: Once | INTRAVENOUS | Status: DC | PRN

## 2023-08-28 ENCOUNTER — Other Ambulatory Visit: Payer: Self-pay | Admitting: Neurosurgery

## 2023-08-28 DIAGNOSIS — M48062 Spinal stenosis, lumbar region with neurogenic claudication: Secondary | ICD-10-CM

## 2023-09-01 ENCOUNTER — Ambulatory Visit
Admission: RE | Admit: 2023-09-01 | Discharge: 2023-09-01 | Disposition: A | Discharge status: Home / Self Care | Source: Ambulatory Visit | Attending: Neurosurgery | Admitting: Neurosurgery

## 2023-09-01 DIAGNOSIS — R590 Localized enlarged lymph nodes: Secondary | ICD-10-CM | POA: Diagnosis not present

## 2023-09-01 DIAGNOSIS — M48062 Spinal stenosis, lumbar region with neurogenic claudication: Secondary | ICD-10-CM | POA: Diagnosis not present

## 2023-09-01 DIAGNOSIS — M545 Low back pain, unspecified: Secondary | ICD-10-CM | POA: Diagnosis not present

## 2023-09-02 ENCOUNTER — Encounter (HOSPITAL_COMMUNITY): Payer: Self-pay

## 2023-09-02 ENCOUNTER — Ambulatory Visit (HOSPITAL_COMMUNITY)
Admission: RE | Admit: 2023-09-02 | Discharge: 2023-09-02 | Disposition: A | Discharge status: Home / Self Care | Source: Ambulatory Visit | Attending: Family Medicine | Admitting: Family Medicine

## 2023-09-02 ENCOUNTER — Other Ambulatory Visit: Payer: Self-pay

## 2023-09-02 VITALS — BP 146/83 | HR 74 | Temp 98.2°F | Resp 18

## 2023-09-02 DIAGNOSIS — K047 Periapical abscess without sinus: Secondary | ICD-10-CM

## 2023-09-02 MED ORDER — AMOXICILLIN-POT CLAVULANATE 875-125 MG PO TABS
1.0000 | ORAL_TABLET | Freq: Two times a day (BID) | ORAL | 0 refills | Status: AC
Start: 2023-09-02 — End: 2023-09-09

## 2023-09-02 MED ORDER — KETOROLAC TROMETHAMINE 30 MG/ML IJ SOLN
INTRAMUSCULAR | Status: AC
  Filled 2023-09-02: qty 1

## 2023-09-02 MED ORDER — HYDROCODONE-ACETAMINOPHEN 5-325 MG PO TABS: 1.0000 | ORAL_TABLET | Freq: Four times a day (QID) | ORAL | 0 refills | Status: DC | PRN

## 2023-09-02 MED ORDER — KETOROLAC TROMETHAMINE 15 MG/ML IJ SOLN
15.0000 mg | Freq: Once | INTRAMUSCULAR | Status: DC
Start: 2023-09-02 — End: 2023-09-02

## 2023-09-02 MED ORDER — KETOROLAC TROMETHAMINE 30 MG/ML IJ SOLN
15.0000 mg | Freq: Once | INTRAMUSCULAR | Status: AC
  Administered 2023-09-02: 15 mg via INTRAMUSCULAR

## 2023-09-02 NOTE — ED Provider Notes (Addendum)
 [facility]    CSN: [id number] Arrival date & time: 09/02/23  1230      History   Chief Complaint Chief Complaint  Patient presents with   Dental Problem    Tooth pulled this past Wednesday, I think it's infected and need antibiotics, pain can not be removed using Tylenol  and tramadol , very painful from the ear, Jew to the neck. - Entered by patient    HPI [name] is a 78 y.o. male.   HPI Here for pain in his left lower jaw that is now extending into his left ear.  On May 12 he had dental extraction in his left lower dental ridge.  He states that it was fairly difficult and came off in pieces.  It felt better for the next couple of days after the extraction, but then on May 14 it began bothering him.  It is now feeling a little swollen and he has been having pain around his left posterior jaw and into the left ear.  No fever or chills.  He is allergic to Flexeril , HCTZ, morphine and codeine, Phenergan , Sinemet , and Tambocor .  He states he can tolerate oxycodone  and hydrocodone .  Last renal function measurement showed 66 EGFR. He is not on any blood thinners per his report.    Past Medical History:  Diagnosis Date   A-fib (HCC)    Arthritis    GERD (gastroesophageal reflux disease)    H/O urinary frequency    Heart murmur    as a child   History of bronchitis    HSV infection    oral   Hypertension    improved after back pain treated with surgery   Insomnia    Insomnia    Improved with Ambien , did not tolerate melatonin.   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   Paroxysmal atrial fibrillation (HCC) 07/15/2021   Irritant dermatitis 04/07/2021   Tinea pedis of both feet 04/07/2021   Peripheral edema 04/07/2021   Other fatigue 01/02/2021   Change in bowel habit 08/06/2020   Healthcare maintenance 03/31/2020   Hypothyroidism 03/31/2020   Constipation 11/19/2019   Insomnia    Allergic rhinitis 07/03/2019   Hyponatremia 05/15/2019   Atrial fibrillation, chronic (HCC) 05/15/2019   Parkinson's disease (HCC) 05/04/2019   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HCC)    History of recent back surgery 04/24/2019   Neural foraminal stenosis of lumbosacral spine 03/31/2019   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   Degenerative scoliosis in adult patient 03/20/2019   Other intervertebral disc displacement, lumbosacral region 03/20/2019   Status post lumbar spinal fusion 02/20/2019   Neck pain 02/04/2019   Intermittent tremor 12/09/2018   Skin lesion 10/31/2017   Lower urinary tract symptoms (LUTS) 04/17/2016   Oral lesion 02/16/2016   Medicare annual wellness visit, initial 12/01/2015   Advance care planning 12/01/2015   Lower back pain 12/01/2015   OSA on CPAP    HTN (hypertension) 04/23/2015  Spondylolisthesis of lumbar region 12/16/2014   GERD (gastroesophageal reflux disease) 10/11/2012   RBBB 06/27/2011    Past Surgical History:  Procedure Laterality Date   ACHILLES TENDON REPAIR Left    ALLOGRAFT APPLICATION Left 11/12/2020   Procedure: APPLICATION OF  INTEGRA;  Surgeon: Alger Infield, MD;  Location: [facility];  Service: Plastics;  Laterality: Left;   BACK SURGERY     COLONOSCOPY  02/2011   Negative,[facility]   DEBRIDEMENT AND CLOSURE WOUND Left 02/25/2018   Procedure: DEBRIDEMENT LEFT LEG APPLICATION INTEGRA;  Surgeon: Alger Infield, MD;  Location: [facility];  Service: Plastics;   Laterality: Left;   EYE SURGERY Bilateral    cataracts   INCISION AND DRAINAGE OF WOUND Left 11/12/2020   Procedure: DEBRIDEMENT LLE ULCER;  Surgeon: Alger Infield, MD;  Location: [facility];  Service: Plastics;  Laterality: Left;   KNEE ARTHROSCOPY     L   LUMBAR FUSION     2016   LUMBAR SPINE SURGERY  03/31/2019   exploration of lumbar fusion w/L5-S1 decompression redo L4-5 fusion   SPINE SURGERY  2003   cervical fusion   TONSILLECTOMY     varicoelectomy         Home Medications    Prior to Admission medications   Medication Sig Start Date End Date Taking? Authorizing Provider  amoxicillin -clavulanate (AUGMENTIN) 875-125 MG tablet Take 1 tablet by mouth 2 (two) times daily for 7 days. 09/02/23 09/09/23 Yes Eirene Rather K, MD  HYDROcodone -acetaminophen  (NORCO/VICODIN) 5-325 MG tablet Take 1 tablet by mouth every 6 (six) hours as needed (pain). 09/02/23  Yes Ann Keto, MD  acetaminophen  (TYLENOL ) 500 MG tablet Take 1,000 mg by mouth daily as needed for moderate pain.    [provider]  cetirizine  (ZYRTEC ) 10 MG tablet TAKE 1 TABLET BY MOUTH EVERY DAY 08/13/22   Terrie Galea, MD  cyanocobalamin (VITAMIN B12) 1000 MCG tablet Take 1,000 mcg by mouth daily.    [provider]  diclofenac Sodium (VOLTAREN) 1 % GEL Apply 1 Application topically 4 (four) times daily as needed (pain.).    [provider]  docusate sodium  (COLACE) 100 MG capsule Take 1 capsule (100 mg total) by mouth 2 (two) times daily. 10/21/21   Garry Kansas, MD  fluticasone  (FLONASE ) 50 MCG/ACT nasal spray Place 2 sprays into both nostrils daily. 03/09/20   Arvine Galea, MD  Multiple Vitamins-Minerals (MULTIVITAMIN MEN 50+) TABS Take 1 tablet by mouth daily.    [provider]  omeprazole (PRILOSEC OTC) 20 MG tablet Take 20 mg by mouth daily as needed (heartburn).    [provider]  potassium chloride  (KLOR-CON ) 10 MEQ tablet Take 1  tablet (10 mEq total) by mouth 2 (two) times daily. As needed for cramps. 05/22/23   Knox Perl, MD  pramipexole  (MIRAPEX ) 0.5 MG tablet TAKE 1 TABLET BY MOUTH 3 TIMES DAILY. 04/02/23   Tat, Von Grumbling, DO  sildenafil (REVATIO) 20 MG tablet Take 20-100 mg by mouth daily as needed.    [provider]  triamcinolone  cream (KENALOG ) 0.1 % APPLY TOPICALLY 2 (TWO) TIMES DAILY AS NEEDED TO AFFECTED AREA 01/10/23   Javoni Galea, MD    Family History Family History  Problem Relation Age of Onset   Hypertension Mother    Asthma Mother    Diabetes Father    Stroke Father    Parkinson's disease Father    Cancer Sister    Heart  attack Paternal Grandfather        >55   Occular Albinism Son    Hypertension Son    Tourist information centre manager Albinism Daughter    Colon cancer Neg Hx    Prostate cancer Neg Hx     Social History Social History   Tobacco Use   Smoking status: Never    Passive exposure: Never   Smokeless tobacco: Never  Vaping Use   Vaping status: Never Used  Substance Use Topics   Alcohol use: No   Drug use: No     Allergies   Flexeril  [cyclobenzaprine ], Hctz [hydrochlorothiazide ], Morphine and codeine, Phenergan  [promethazine ], Sinemet  [carbidopa -levodopa ], and Tambocor  [flecainide ]   Review of Systems Review of Systems   Physical Exam Triage Vital Signs ED Triage Vitals [09/02/23 1259]  Encounter Vitals Group     BP (!) 146/83     Systolic BP Percentile      Diastolic BP Percentile      Pulse Rate 74     Resp 18     Temp 98.2 F (36.8 C)     Temp Source Oral     SpO2 95 %     Weight      Height      Head Circumference      Peak Flow      Pain Score 8     Pain Loc      Pain Education      Exclude from Growth Chart    No data found.  Updated Vital Signs BP (!) 146/83 (BP Location: Right Arm)   Pulse 74   Temp 98.2 F (36.8 C) (Oral)   Resp 18   SpO2 95%   Visual Acuity Right Eye Distance:   Left Eye Distance:   Bilateral Distance:    Right Eye  Near:   Left Eye Near:    Bilateral Near:     Physical Exam Vitals reviewed.  Constitutional:      General: He is not in acute distress.    Appearance: He is not ill-appearing, toxic-appearing or diaphoretic.  HENT:     Mouth/Throat:     Mouth: Mucous membranes are moist.     Comments: There is yellow material in the bottom of a hole in his lower dental ridge.  There is some mild swelling of the dental ridge but no erythema and no drainage otherwise. Cardiovascular:     Rate and Rhythm: Normal rate and regular rhythm.     Heart sounds: No murmur heard. Pulmonary:     Effort: Pulmonary effort is normal.     Breath sounds: Normal breath sounds.  Skin:    Coloration: Skin is not pale.  Neurological:     General: No focal deficit present.     Mental Status: He is alert and oriented to person, place, and time.  Psychiatric:        Behavior: Behavior normal.      UC Treatments / Results  Labs (all labs ordered are listed, but only abnormal results are displayed) Labs Reviewed - No data to display  EKG   Radiology No results found.  Procedures Procedures (including critical care time)  Medications Ordered in UC Medications  ketorolac  (TORADOL ) 30 MG/ML injection 15 mg (has no administration in time range)    Initial Impression / Assessment and Plan / UC Course  I have reviewed the triage vital signs and the nursing notes.  Pertinent labs & imaging results that were available during my care of the patient  were reviewed by me and considered in my medical decision making (see chart for details).     Toradol  15 mg injection is given here for pain. Augmentin is sent in for dental infection and hydrocodone  is sent in and small quantity for pain relief.  He last filled some tramadol  about 1 week ago, but that is for his back and it is not helping his pain.  No duplications on PMP.  I have asked him to follow-up with his dentist and possibly his primary care about this  issue  Final Clinical Impressions(s) / UC Diagnoses   Final diagnoses:  Dental infection     Discharge Instructions      You have been given a shot of Toradol  15 mg today.  Take amoxicillin -clavulanate 875 mg--1 tab twice daily with food for 7 days  Hydrocodone  5 mg--1 tablet every 6 hours as needed for pain.  This is best taken with food.  It can cause sleepiness or dizziness.  Do not take this with the tramadol   Please follow-up with your dentist     ED Prescriptions     Medication Sig Dispense Auth. Provider   amoxicillin -clavulanate (AUGMENTIN) 875-125 MG tablet Take 1 tablet by mouth 2 (two) times daily for 7 days. 14 tablet Evany Schecter K, MD   HYDROcodone -acetaminophen  (NORCO/VICODIN) 5-325 MG tablet Take 1 tablet by mouth every 6 (six) hours as needed (pain). 12 tablet Edgar Reisz K, MD      I have reviewed the PDMP during this encounter.   Ann Keto, MD 09/02/23 1326    Ann Keto, MD 09/02/23 [phone]

## 2023-09-02 NOTE — ED Triage Notes (Signed)
 Pt reports persisted left upper dental pain since the pull out his teeth on Wednesday. Pt is concerned about possible infection. No abx prescribed after procedure.

## 2023-09-02 NOTE — Discharge Instructions (Addendum)
 You have been given a shot of Toradol  15 mg today.  Take amoxicillin -clavulanate 875 mg--1 tab twice daily with food for 7 days  Hydrocodone  5 mg--1 tablet every 6 hours as needed for pain.  This is best taken with food.  It can cause sleepiness or dizziness.  Do not take this with the tramadol   Please follow-up with your dentist

## 2023-09-06 ENCOUNTER — Encounter: Payer: Self-pay | Admitting: Family Medicine

## 2023-09-06 ENCOUNTER — Ambulatory Visit (INDEPENDENT_AMBULATORY_CARE_PROVIDER_SITE_OTHER): Admitting: Family Medicine

## 2023-09-06 VITALS — BP 124/84 | HR 73 | Temp 98.0°F | Ht 75.0 in | Wt 196.2 lb

## 2023-09-06 DIAGNOSIS — B370 Candidal stomatitis: Secondary | ICD-10-CM | POA: Diagnosis not present

## 2023-09-06 DIAGNOSIS — M273 Alveolitis of jaws: Secondary | ICD-10-CM | POA: Diagnosis not present

## 2023-09-06 MED ORDER — NYSTATIN 100000 UNIT/ML MT SUSP: 5.0000 mL | Freq: Four times a day (QID) | OROMUCOSAL | 1 refills | Status: DC

## 2023-09-06 NOTE — Assessment & Plan Note (Signed)
 Now improved after 3 days antibiotics.  I feel it is safe to remain off antibitotic given SE. Pt would like to remain off.   Return and ER precautions provided.  Will need to restart antibioticsif fever or if pain in jaw returns.

## 2023-09-06 NOTE — Assessment & Plan Note (Signed)
 Acute , likely secondary to antibiotics.  Start nystatin  swish and swallow small amount .

## 2023-09-06 NOTE — Progress Notes (Signed)
 Patient ID: VU LIEBMAN, male    DOB: 1946/04/04, 78 y.o.   MRN: [id number]  This visit was conducted in person.  BP 124/84 (BP Location: Right Arm, Patient Position: Sitting, Cuff Size: Normal)   Pulse 73   Temp 98 F (36.7 C) (Temporal)   Ht 6\' 3"  (1.905 m)   Wt 196 lb 3.2 oz (89 kg)   SpO2 97%   BMI 24.52 kg/m    CC:  Chief Complaint  Patient presents with   Acute Visit    Possible thrush    Subjective:   HPI: [name] is a 78 y.o. male presenting on 09/06/2023 for Acute Visit (Possible thrush)  Recent tooth pulled at dentist on 5/14... had some following swelling  Treated with antibiotics at Ur[facility] 5/18 on day 2 of Augmentin  x 7 days   Follow up on 5/19.[facility] dentist said things were improving.   Now in last 24 hour ST, mouth. Some white plaque on tongue.  No fever. Feeling bad overall.   He stopped antibiotics 09/05/2023  He report tooth and face pain improved. No fever.  Keeping down liquids well.    Has history of Parkinson's.        Relevant past medical, surgical, family and social history reviewed and updated as indicated. Interim medical history since our last visit reviewed. Allergies and medications reviewed and updated. Outpatient Medications Prior to Visit  Medication Sig Dispense Refill   acetaminophen  (TYLENOL ) 500 MG tablet Take 1,000 mg by mouth daily as needed for moderate pain.     amoxicillin -clavulanate (AUGMENTIN ) 875-125 MG tablet Take 1 tablet by mouth 2 (two) times daily for 7 days. 14 tablet 0   cetirizine  (ZYRTEC ) 10 MG tablet TAKE 1 TABLET BY MOUTH EVERY DAY 90 tablet 3   cyanocobalamin (VITAMIN B12) 1000 MCG tablet Take 1,000 mcg by mouth daily.     diclofenac Sodium (VOLTAREN) 1 % GEL Apply 1 Application topically 4 (four) times daily as needed (pain.).     docusate sodium  (COLACE) 100 MG capsule Take 1 capsule (100 mg total) by mouth 2 (two) times daily. 60 capsule 0   fluticasone  (FLONASE ) 50 MCG/ACT nasal spray  Place 2 sprays into both nostrils daily.     HYDROcodone -acetaminophen  (NORCO/VICODIN) 5-325 MG tablet Take 1 tablet by mouth every 6 (six) hours as needed (pain). 12 tablet 0   Multiple Vitamins-Minerals (MULTIVITAMIN MEN 50+) TABS Take 1 tablet by mouth daily.     potassium chloride  (KLOR-CON ) 10 MEQ tablet Take 1 tablet (10 mEq total) by mouth 2 (two) times daily. As needed for cramps. 180 tablet 0   pramipexole  (MIRAPEX ) 0.5 MG tablet TAKE 1 TABLET BY MOUTH 3 TIMES DAILY. 270 tablet 0   sildenafil (REVATIO) 20 MG tablet Take 20-100 mg by mouth daily as needed.     triamcinolone  cream (KENALOG ) 0.1 % APPLY TOPICALLY 2 (TWO) TIMES DAILY AS NEEDED TO AFFECTED AREA 30 g 2   omeprazole (PRILOSEC OTC) 20 MG tablet Take 20 mg by mouth daily as needed (heartburn).     No facility-administered medications prior to visit.     Per HPI unless specifically indicated in ROS section below Review of Systems  Constitutional:  Negative for fatigue and fever.  HENT:  Negative for ear pain.   Eyes:  Negative for pain.  Respiratory:  Negative for cough and shortness of breath.   Cardiovascular:  Negative for chest pain, palpitations and leg swelling.  Gastrointestinal:  Negative for abdominal  pain.  Genitourinary:  Negative for dysuria.  Musculoskeletal:  Negative for arthralgias.  Neurological:  Negative for syncope, light-headedness and headaches.  Psychiatric/Behavioral:  Negative for dysphoric mood.    Objective:  BP 124/84 (BP Location: Right Arm, Patient Position: Sitting, Cuff Size: Normal)   Pulse 73   Temp 98 F (36.7 C) (Temporal)   Ht 6\' 3"  (1.905 m)   Wt 196 lb 3.2 oz (89 kg)   SpO2 97%   BMI 24.52 kg/m   Wt Readings from Last 3 Encounters:  09/06/23 196 lb 3.2 oz (89 kg)  07/31/23 195 lb 12.8 oz (88.8 kg)  07/20/23 195 lb 3.2 oz (88.5 kg)      Physical Exam Vitals reviewed.  Constitutional:      Appearance: He is well-developed.  HENT:     Head: Normocephalic.     Right  Ear: Hearing normal.     Left Ear: Hearing normal.     Nose: Nose normal.     Mouth/Throat:     Dentition: Abnormal dentition. Gingival swelling present. No dental abscesses.     Tongue: Lesions present.     Pharynx: Oropharyngeal exudate and posterior oropharyngeal erythema present. No pharyngeal swelling.     Comments: Erythema and whtie plaque on tongue and cheeks and oropharynx.  Well healing socket left lower jaw, no pain or swelling in jaw   Neck:     Thyroid : No thyroid  mass or thyromegaly.     Vascular: No carotid bruit.     Trachea: Trachea normal.  Cardiovascular:     Rate and Rhythm: Normal rate and regular rhythm.     Pulses: Normal pulses.     Heart sounds: Heart sounds not distant. No murmur heard.    No friction rub. No gallop.     Comments: No peripheral edema Pulmonary:     Effort: Pulmonary effort is normal. No respiratory distress.     Breath sounds: Normal breath sounds.  Lymphadenopathy:     Head:     Right side of head: No submental, submandibular, tonsillar, preauricular or posterior auricular adenopathy.     Left side of head: No submental, submandibular, tonsillar, preauricular or posterior auricular adenopathy.     Cervical: No cervical adenopathy.  Skin:    General: Skin is warm and dry.     Findings: No rash.  Psychiatric:        Speech: Speech normal.        Behavior: Behavior normal.        Thought Content: Thought content normal.       Results for orders placed or performed in visit on 07/20/23  Comprehensive metabolic panel with GFR   Collection Time: 07/20/23 11:11 AM  Result Value Ref Range   Sodium 129 (L) 135 - 145 mEq/L   Potassium 4.3 3.5 - 5.1 mEq/L   Chloride 92 (L) 96 - 112 mEq/L   CO2 28 19 - 32 mEq/L   Glucose, Bld 92 70 - 99 mg/dL   BUN 9 6 - 23 mg/dL   Creatinine, Ser 2.13 0.40 - 1.50 mg/dL   Total Bilirubin 0.7 0.2 - 1.2 mg/dL   Alkaline Phosphatase 66 39 - 117 U/L   AST 19 0 - 37 U/L   ALT 7 0 - 53 U/L   Total Protein  6.5 6.0 - 8.3 g/dL   Albumin 4.4 3.5 - 5.2 g/dL   GFR 08.65 >78.46 mL/min   Calcium  9.0 8.4 - 10.5 mg/dL  CBC with Differential/Platelet  Collection Time: 07/20/23 11:11 AM  Result Value Ref Range   WBC 7.3 4.0 - 10.5 K/uL   RBC 4.38 4.22 - 5.81 Mil/uL   Hemoglobin 13.6 13.0 - 17.0 g/dL   HCT 54.0 98.1 - 19.1 %   MCV 91.3 78.0 - 100.0 fl   MCHC 34.0 30.0 - 36.0 g/dL   RDW 47.8 29.5 - 62.1 %   Platelets 264.0 150.0 - 400.0 K/uL   Neutrophils Relative % 69.8 43.0 - 77.0 %   Lymphocytes Relative 18.3 12.0 - 46.0 %   Monocytes Relative 9.7 3.0 - 12.0 %   Eosinophils Relative 1.8 0.0 - 5.0 %   Basophils Relative 0.4 0.0 - 3.0 %   Neutro Abs 5.1 1.4 - 7.7 K/uL   Lymphs Abs 1.3 0.7 - 4.0 K/uL   Monocytes Absolute 0.7 0.1 - 1.0 K/uL   Eosinophils Absolute 0.1 0.0 - 0.7 K/uL   Basophils Absolute 0.0 0.0 - 0.1 K/uL  TSH   Collection Time: 07/20/23 11:11 AM  Result Value Ref Range   TSH 3.25 0.35 - 5.50 uIU/mL    Assessment and Plan  Thrush Assessment & Plan: Acute , likely secondary to antibiotics.  Start nystatin  swish and swallow small amount .    Infection of tooth socket Assessment & Plan: Now improved after 3 days antibiotics.  I feel it is safe to remain off antibitotic given SE. Pt would like to remain off.   Return and ER precautions provided.  Will need to restart antibioticsif fever or if pain in jaw returns.   Other orders -     Nystatin ; Take 5 mLs (500,000 Units total) by mouth 4 (four) times daily.  Dispense: 60 mL; Refill: 1    No follow-ups on file.   Herby Lolling, MD

## 2023-09-10 ENCOUNTER — Other Ambulatory Visit: Payer: Self-pay | Admitting: Cardiology

## 2023-09-21 DIAGNOSIS — M48062 Spinal stenosis, lumbar region with neurogenic claudication: Secondary | ICD-10-CM | POA: Diagnosis not present

## 2023-09-23 ENCOUNTER — Other Ambulatory Visit: Payer: Self-pay | Admitting: Neurology

## 2023-09-23 DIAGNOSIS — G20A1 Parkinson's disease without dyskinesia, without mention of fluctuations: Secondary | ICD-10-CM

## 2023-09-27 ENCOUNTER — Other Ambulatory Visit: Payer: Self-pay | Admitting: Neurosurgery

## 2023-09-27 DIAGNOSIS — M48062 Spinal stenosis, lumbar region with neurogenic claudication: Secondary | ICD-10-CM

## 2023-10-03 ENCOUNTER — Other Ambulatory Visit: Payer: Self-pay

## 2023-10-05 NOTE — Discharge Instructions (Signed)

## 2023-10-08 ENCOUNTER — Ambulatory Visit
Admission: RE | Admit: 2023-10-08 | Discharge: 2023-10-08 | Disposition: A | Discharge status: Home / Self Care | Source: Ambulatory Visit | Attending: Neurosurgery | Admitting: Neurosurgery

## 2023-10-08 ENCOUNTER — Ambulatory Visit
Admission: RE | Admit: 2023-10-08 | Discharge: 2023-10-08 | Discharge status: Home / Self Care | Source: Ambulatory Visit | Attending: Neurosurgery | Admitting: Neurosurgery

## 2023-10-08 DIAGNOSIS — M48062 Spinal stenosis, lumbar region with neurogenic claudication: Secondary | ICD-10-CM | POA: Diagnosis not present

## 2023-10-08 DIAGNOSIS — Z981 Arthrodesis status: Secondary | ICD-10-CM | POA: Diagnosis not present

## 2023-10-08 MED ORDER — DIAZEPAM 5 MG PO TABS
5.0000 mg | ORAL_TABLET | Freq: Once | ORAL | Status: AC
  Administered 2023-10-08: 5 mg via ORAL

## 2023-10-08 MED ORDER — IOPAMIDOL (ISOVUE-M 200) INJECTION 41%
20.0000 mL | Freq: Once | INTRAMUSCULAR | Status: AC
  Administered 2023-10-08: 20 mL via INTRATHECAL

## 2023-10-08 MED ORDER — ONDANSETRON HCL 4 MG/2ML IJ SOLN: 4.0000 mg | Freq: Once | INTRAMUSCULAR | Status: DC | PRN

## 2023-10-08 MED ORDER — MEPERIDINE HCL 50 MG/ML IJ SOLN: 50.0000 mg | Freq: Once | INTRAMUSCULAR | Status: DC | PRN

## 2023-10-31 DIAGNOSIS — M48062 Spinal stenosis, lumbar region with neurogenic claudication: Secondary | ICD-10-CM | POA: Diagnosis not present

## 2023-11-15 DIAGNOSIS — Z961 Presence of intraocular lens: Secondary | ICD-10-CM | POA: Diagnosis not present

## 2023-11-15 DIAGNOSIS — H43813 Vitreous degeneration, bilateral: Secondary | ICD-10-CM | POA: Diagnosis not present

## 2023-11-15 DIAGNOSIS — H04123 Dry eye syndrome of bilateral lacrimal glands: Secondary | ICD-10-CM | POA: Diagnosis not present

## 2023-12-05 ENCOUNTER — Other Ambulatory Visit: Payer: Self-pay | Admitting: Neurology

## 2023-12-05 DIAGNOSIS — G20A1 Parkinson's disease without dyskinesia, without mention of fluctuations: Secondary | ICD-10-CM

## 2023-12-12 DIAGNOSIS — M48062 Spinal stenosis, lumbar region with neurogenic claudication: Secondary | ICD-10-CM | POA: Diagnosis not present

## 2023-12-24 DIAGNOSIS — M25511 Pain in right shoulder: Secondary | ICD-10-CM | POA: Diagnosis not present

## 2023-12-24 DIAGNOSIS — M170 Bilateral primary osteoarthritis of knee: Secondary | ICD-10-CM | POA: Diagnosis not present

## 2024-01-01 DIAGNOSIS — M48062 Spinal stenosis, lumbar region with neurogenic claudication: Secondary | ICD-10-CM | POA: Diagnosis not present

## 2024-01-31 DIAGNOSIS — N529 Male erectile dysfunction, unspecified: Secondary | ICD-10-CM | POA: Diagnosis not present

## 2024-01-31 DIAGNOSIS — G20A1 Parkinson's disease without dyskinesia, without mention of fluctuations: Secondary | ICD-10-CM | POA: Diagnosis not present

## 2024-02-13 ENCOUNTER — Other Ambulatory Visit: Payer: Self-pay | Admitting: Nurse Practitioner

## 2024-02-13 DIAGNOSIS — R197 Diarrhea, unspecified: Secondary | ICD-10-CM | POA: Diagnosis not present

## 2024-02-13 DIAGNOSIS — R1310 Dysphagia, unspecified: Secondary | ICD-10-CM | POA: Diagnosis not present

## 2024-02-13 DIAGNOSIS — R432 Parageusia: Secondary | ICD-10-CM | POA: Diagnosis not present

## 2024-02-21 ENCOUNTER — Inpatient Hospital Stay
Admission: RE | Admit: 2024-02-21 | Discharge: 2024-02-21 | Attending: Nurse Practitioner | Admitting: Nurse Practitioner

## 2024-02-21 DIAGNOSIS — R1310 Dysphagia, unspecified: Secondary | ICD-10-CM

## 2024-02-22 ENCOUNTER — Other Ambulatory Visit: Payer: Self-pay | Admitting: Neurology

## 2024-02-22 ENCOUNTER — Encounter: Admitting: Family Medicine

## 2024-02-22 DIAGNOSIS — G20A1 Parkinson's disease without dyskinesia, without mention of fluctuations: Secondary | ICD-10-CM

## 2024-02-22 NOTE — Telephone Encounter (Signed)
 ok he is in [place]  from 04-11-24 to April , I dont have anything until 04-23-24 and he is out of state

## 2024-02-25 NOTE — Progress Notes (Unsigned)
 Assessment/Plan:   1.  Parkinsons Disease with levodopa  resistant tremor   -Patient is status post focused ultrasound to the left VIM at [facility] on November 29, 2022.  This certainly helped his levodopa  resistant tremor, but unfortunately he had some balance difficulties after.  This has improved dramatically and patient continues to improve from that regard.  He has appt next month for f/u with Dr. Leellen  -Patient was previously hoping to taper the medication.  I reminded him that he still had Parkinsons disease, which is much more than tremor.  Asks if there is a cure for Parkinsons Disease and discussed that there is not  - Previously tried to hold the pramipexole  to see if that was the cause of increased appetite and daytime sleepiness, but the patient was not even able to hold it for 1 day just to test to see if that was the cause.  He reported restless leg was significant and he could not hold it even for a weekend.  He ended up going back on it.  I suspect that he gets augmentation with this medication, but I really have not increased the dose ever, nor do I plan to.  He is on 0.5 mg 3 times per day.  He knows not to drive if sleepy.   -continue carbidopa /levodopa  ER, 1 po tid   2.  Atrial fibrillation hx  -off Xarelto  for quite some time due to no recurrence of a-fib, which occurred post op.  Follows with Dr. Ladona  3.  Insomnia  -has d/c mirtazapine   4.  Back pain  -Sees Dr. Mavis. Pt had surgery in July, 2023  -Patient stated Dr. Mavis told him that he thought he had neuropathy as well.  We would do EMG.  -Patient with intermittent paresthesias in the feet.  Depending on the results of the EMG, he is to think about whether or not he really wants medication for that.  5.  Nausea, abd pain, decreased appetite  -looking at chart he has had these sx's for a long time and was hospitalized a year ago for similar s/s.  I highly doubt related to Parkinsons Disease meds. Now following  with [facility]  6.  Dysphagia  -order mbe  -just had  pharyngogram done and aspiration noted.  Going to do ST   Subjective:   [name] was seen today in follow up for Parkinsons disease.  My previous records were reviewed prior to todays visit as well as outside records available to me.   Wife with patient who supplements the history.  Last visit, I wanted to hold his pramipexole  just for a week and to see if it helped his daytime sleepiness.  However, the patient was not even able to hold it for 1 day, so we were not able to test if that was the source of the sleepiness.  He stated that his restless leg was too bad.  Last visit, I did get a new authorization on his Rytary  and it was approved, but patient has not been taking that version of levodopa .  He was not aware it was ever approved.  Thinks he did same on it as regular levodopa .  He is taking carbidopa /levodopa  25/100 CR, 1 tablet 3 times per day.  He walks for exercise.  No falls but balance isn't as good.  He just had a swallow test by GI with some evidence of aspiration.  He was recommended for ST and asks about that  Current prescribed movement disorder medications: Pramipexole  0.5 mg three times per day Carbidopa /levodopa  25/100, 1 3 times per day.   PREVIOUS MEDICATIONS: Levodopa  IR (nausea); carbidopa /levodopa  25/100 CR (nausea); rytary  (it was approved but he was not aware so didn't pick up - thinks felt same on it as regular levodopa ); mirtazapine ; propranolol   ALLERGIES:   Allergies  Allergen Reactions   Flexeril  [Cyclobenzaprine ] Other (See Comments)    Constipation   Hctz [Hydrochlorothiazide ] Other (See Comments)    Hyponatremia    Morphine And Codeine Hives and Other (See Comments)    Hallucinations Can tolerate oxycodone    Phenergan  [Promethazine ] Other (See Comments)    Told to avoid due to Parkinson's   Sinemet  [Carbidopa -Levodopa ] Other (See Comments)    Intolerant of short acting form   Tambocor   [Flecainide ] Nausea And Vomiting    CURRENT MEDICATIONS:  Outpatient Encounter Medications as of 02/27/2024  Medication Sig   acetaminophen  (TYLENOL ) 500 MG tablet Take 1,000 mg by mouth daily as needed for moderate pain.   diclofenac Sodium (VOLTAREN) 1 % GEL Apply 1 Application topically 4 (four) times daily as needed (pain.).   docusate sodium  (COLACE) 100 MG capsule Take 1 capsule (100 mg total) by mouth 2 (two) times daily.   Multiple Vitamins-Minerals (MULTIVITAMIN MEN 50+) TABS Take 1 tablet by mouth daily.   sildenafil (REVATIO) 20 MG tablet Take 20-100 mg by mouth daily as needed.   [DISCONTINUED] Carbidopa -Levodopa  ER (SINEMET  CR) 25-100 MG tablet controlled release TAKE 1 TABLET BY MOUTH AT 7 AM, 1 AT 11 AM, AND 1 AT 4PM   [DISCONTINUED] pramipexole  (MIRAPEX ) 0.5 MG tablet TAKE 1 TABLET BY MOUTH 3 TIMES DAILY.   Carbidopa -Levodopa  ER (SINEMET  CR) 25-100 MG tablet controlled release Take 1 tablet by mouth 3 (three) times daily.   cetirizine  (ZYRTEC ) 10 MG tablet TAKE 1 TABLET BY MOUTH EVERY DAY   cyanocobalamin (VITAMIN B12) 1000 MCG tablet Take 1,000 mcg by mouth daily.   fluticasone  (FLONASE ) 50 MCG/ACT nasal spray Place 2 sprays into both nostrils daily. (Patient not taking: Reported on 02/27/2024)   HYDROcodone -acetaminophen  (NORCO/VICODIN) 5-325 MG tablet Take 1 tablet by mouth every 6 (six) hours as needed (pain).   nystatin  (MYCOSTATIN ) 100000 UNIT/ML suspension Take 5 mLs (500,000 Units total) by mouth 4 (four) times daily.   potassium chloride  (KLOR-CON ) 10 MEQ tablet Take 1 tablet (10 mEq total) by mouth 2 (two) times daily. As needed for cramps. (Patient not taking: Reported on 02/27/2024)   pramipexole  (MIRAPEX ) 0.5 MG tablet Take 1 tablet (0.5 mg total) by mouth 3 (three) times daily.   triamcinolone  cream (KENALOG ) 0.1 % APPLY TOPICALLY 2 (TWO) TIMES DAILY AS NEEDED TO AFFECTED AREA   No facility-administered encounter medications on file as of 02/27/2024.     Objective:   PHYSICAL EXAMINATION:    VITALS:   Vitals:   02/27/24 0901  BP: 128/64  Pulse: 68  SpO2: 99%  Weight: 191 lb 6.4 oz (86.8 kg)  Height: 6' 3 (1.905 m)    Wt Readings from Last 3 Encounters:  02/27/24 191 lb 6.4 oz (86.8 kg)  09/06/23 196 lb 3.2 oz (89 kg)  07/31/23 195 lb 12.8 oz (88.8 kg)      GEN:  The patient appears stated age and is in NAD. HEENT:  Normocephalic, atraumatic.  The mucous membranes are moist. The superficial temporal arteries are without ropiness or tenderness. Cv:  RRR Lungs:  CTAB  Neurological examination:  Orientation: The patient is alert and oriented x3.  Cranial nerves: There is good facial symmetry with facial hypomimia. The speech is fluent and clear. Soft palate rises symmetrically and there is no tongue deviation. Hearing is intact to conversational tone. Sensation: Sensation is intact to light touch throughout.  Vibration is slightly decreased distally.  No extinction with dss Motor: Strength is at least antigravity x4.   Movement examination: Tone: There is nl tone in the UE/LE Abnormal movements:no rest tremor Coordination:  There is mild decremation on the R and with finger and toe taps.  He has trouble on the L due to ankle issue Gait and Station: The patient pushes off to arise. The patient's stride length is decreased and he is forward flexed.  He is side bent to the right.  He has decreased arm swing on the R I have reviewed and interpreted the following labs independently  Lab Results  Component Value Date   VITAMINB12 688 12/30/2020       Chemistry      Component Value Date/Time   NA 129 (L) 07/20/2023 1111   NA 139 07/01/2020 1021   K 4.3 07/20/2023 1111   CL 92 (L) 07/20/2023 1111   CO2 28 07/20/2023 1111   BUN 9 07/20/2023 1111   BUN 12 07/01/2020 1021   CREATININE 1.08 07/20/2023 1111   CREATININE 1.06 01/20/2022 0858      Component Value Date/Time   CALCIUM  9.0 07/20/2023 1111   ALKPHOS 66  07/20/2023 1111   AST 19 07/20/2023 1111   ALT 7 07/20/2023 1111   BILITOT 0.7 07/20/2023 1111       Lab Results  Component Value Date   WBC 7.3 07/20/2023   HGB 13.6 07/20/2023   HCT 40.0 07/20/2023   MCV 91.3 07/20/2023   PLT 264.0 07/20/2023    Lab Results  Component Value Date   TSH 3.25 07/20/2023   Lab Results  Component Value Date   ESRSEDRATE 14 01/20/2022    Lab Results  Component Value Date   IRON 58 08/04/2022   FERRITIN 53.0 08/04/2022    Total time spent on today's visit was 40 minutes, including both face-to-face time and nonface-to-face time.  Time included that spent on review of records (prior notes available to me/labs/imaging if pertinent), discussing treatment and goals, answering patient's questions and coordinating care.   Cc:  Cleatus Arlyss RAMAN, MD

## 2024-02-27 ENCOUNTER — Telehealth (HOSPITAL_COMMUNITY): Payer: Self-pay | Admitting: Neurology

## 2024-02-27 ENCOUNTER — Encounter: Payer: Self-pay | Admitting: Neurology

## 2024-02-27 ENCOUNTER — Ambulatory Visit: Admitting: Neurology

## 2024-02-27 ENCOUNTER — Other Ambulatory Visit: Payer: Self-pay

## 2024-02-27 VITALS — BP 128/64 | HR 68 | Ht 75.0 in | Wt 191.4 lb

## 2024-02-27 DIAGNOSIS — R1319 Other dysphagia: Secondary | ICD-10-CM | POA: Diagnosis not present

## 2024-02-27 DIAGNOSIS — G20A1 Parkinson's disease without dyskinesia, without mention of fluctuations: Secondary | ICD-10-CM | POA: Diagnosis not present

## 2024-02-27 DIAGNOSIS — R1310 Dysphagia, unspecified: Secondary | ICD-10-CM

## 2024-02-27 MED ORDER — CARBIDOPA-LEVODOPA ER 25-100 MG PO TBCR: 1.0000 | EXTENDED_RELEASE_TABLET | Freq: Three times a day (TID) | ORAL | 1 refills | Status: AC

## 2024-02-27 MED ORDER — PRAMIPEXOLE DIHYDROCHLORIDE 0.5 MG PO TABS: 0.5000 mg | ORAL_TABLET | Freq: Three times a day (TID) | ORAL | 1 refills | Status: AC

## 2024-02-27 NOTE — Telephone Encounter (Signed)
 Attempt to contact to set-up op mbs test at [phone]. Left voicemail and call back request. (AHARRIS)

## 2024-02-28 ENCOUNTER — Other Ambulatory Visit (HOSPITAL_COMMUNITY): Payer: Self-pay | Admitting: Neurology

## 2024-02-28 DIAGNOSIS — R1310 Dysphagia, unspecified: Secondary | ICD-10-CM

## 2024-02-28 DIAGNOSIS — R059 Cough, unspecified: Secondary | ICD-10-CM

## 2024-03-05 DIAGNOSIS — G5793 Unspecified mononeuropathy of bilateral lower limbs: Secondary | ICD-10-CM | POA: Diagnosis not present

## 2024-03-05 DIAGNOSIS — M79672 Pain in left foot: Secondary | ICD-10-CM | POA: Diagnosis not present

## 2024-03-10 DIAGNOSIS — R197 Diarrhea, unspecified: Secondary | ICD-10-CM | POA: Diagnosis not present

## 2024-03-10 DIAGNOSIS — R1310 Dysphagia, unspecified: Secondary | ICD-10-CM | POA: Diagnosis not present

## 2024-03-10 DIAGNOSIS — I10 Essential (primary) hypertension: Secondary | ICD-10-CM | POA: Diagnosis not present

## 2024-03-10 DIAGNOSIS — R634 Abnormal weight loss: Secondary | ICD-10-CM | POA: Diagnosis not present

## 2024-03-19 ENCOUNTER — Ambulatory Visit (HOSPITAL_COMMUNITY)
Admission: RE | Admit: 2024-03-19 | Discharge: 2024-03-19 | Disposition: A | Source: Ambulatory Visit | Attending: Family Medicine | Admitting: Family Medicine

## 2024-03-19 ENCOUNTER — Ambulatory Visit (HOSPITAL_COMMUNITY)
Admission: RE | Admit: 2024-03-19 | Discharge: 2024-03-19 | Disposition: A | Source: Ambulatory Visit | Attending: Family Medicine

## 2024-03-19 DIAGNOSIS — R21 Rash and other nonspecific skin eruption: Secondary | ICD-10-CM | POA: Diagnosis not present

## 2024-03-19 DIAGNOSIS — R1310 Dysphagia, unspecified: Secondary | ICD-10-CM

## 2024-03-19 DIAGNOSIS — R059 Cough, unspecified: Secondary | ICD-10-CM | POA: Diagnosis not present

## 2024-03-19 DIAGNOSIS — R1312 Dysphagia, oropharyngeal phase: Secondary | ICD-10-CM | POA: Diagnosis not present

## 2024-03-19 DIAGNOSIS — R053 Chronic cough: Secondary | ICD-10-CM | POA: Diagnosis not present

## 2024-03-19 NOTE — Evaluation (Signed)
 Modified Barium Swallow Study  Patient Details  Name: [name] MRN: [id number] Date of Birth: 1946-01-21  Today's Date: 03/19/2024  Modified Barium Swallow completed.  Full report located under Chart Review in the Imaging Section.  History of Present Illness Paddy Neis is a 78 y.o. male who was referred for OP MBS after episode of aspiration was noted on recent esophagram.  PMHx includes Parkinson's dz, afib, ACDF.  S/P focused ultrasound to the left ventral intermediate nucleus of the thalamus at [facility] on 11/29/22.   Clinical Impression Mr. Misner presented with a functional oropharyngeal swallow. There was mild decreased mobility of the laryngeal structures, leading to transient, trace penetration of thin and nectar thick liquids. (PAS 2). There was no aspiration.  Mild vallecular residue was present due to decreased base-of-tongue contact with the pharyngeal wall. Oral phase was [facility]. 13 mm barium pill transferred easily through UES and esophagus.  Recommend he continue current diet - regular solids/thin liquids.  He has no hx of pna.  We discussed the potential of PD to eventually impact swallowing and the benefits of having MBS today to record baseline function.  Pt/wife verbalized understanding.  No SLP f/u needed.   Factors that may increase risk of adverse event in presence of aspiration Noe & Lianne 2021):  (none)  Swallow Evaluation Recommendations Recommendations: PO diet PO Diet Recommendation: Regular;Thin liquids (Level 0) Liquid Administration via: Cup;Straw Medication Administration: Whole meds with liquid Supervision: Patient able to self-feed Oral care recommendations: Oral care BID (2x/day)    Jatziri Goffredo L. Vona, MA CCC/SLP Clinical Specialist - Acute Care SLP Acute Rehabilitation Services Office number [phone]   Vona Palma Laurice 03/19/2024,4:40 PM

## 2024-03-20 ENCOUNTER — Encounter: Payer: Self-pay | Admitting: Family Medicine

## 2024-03-20 ENCOUNTER — Ambulatory Visit: Admitting: Family Medicine

## 2024-03-20 VITALS — BP 120/64 | HR 71 | Temp 97.9°F | Ht 72.5 in | Wt 197.1 lb

## 2024-03-20 DIAGNOSIS — I2510 Atherosclerotic heart disease of native coronary artery without angina pectoris: Secondary | ICD-10-CM | POA: Diagnosis not present

## 2024-03-20 DIAGNOSIS — G20A1 Parkinson's disease without dyskinesia, without mention of fluctuations: Secondary | ICD-10-CM | POA: Diagnosis not present

## 2024-03-20 DIAGNOSIS — E871 Hypo-osmolality and hyponatremia: Secondary | ICD-10-CM

## 2024-03-20 DIAGNOSIS — Z0000 Encounter for general adult medical examination without abnormal findings: Secondary | ICD-10-CM

## 2024-03-20 DIAGNOSIS — R399 Unspecified symptoms and signs involving the genitourinary system: Secondary | ICD-10-CM

## 2024-03-20 DIAGNOSIS — L989 Disorder of the skin and subcutaneous tissue, unspecified: Secondary | ICD-10-CM | POA: Diagnosis not present

## 2024-03-20 DIAGNOSIS — Z7189 Other specified counseling: Secondary | ICD-10-CM

## 2024-03-20 MED ORDER — FLUTICASONE PROPIONATE 50 MCG/ACT NA SUSP: 2.0000 | Freq: Every day | NASAL | Status: AC | PRN

## 2024-03-20 NOTE — Patient Instructions (Addendum)
 Go to the lab on the way out.   If you have mychart we'll likely use that to update you.    Refer to urology and derm.  Let me know if you can't get an appointment.  Take care.  Glad to see you.

## 2024-03-20 NOTE — Progress Notes (Signed)
 Flu vaccine - pt declined, d/w pt.  PNA vaccines - pt declined, d/w pt.   Covid vaccine declined. Tetanus d/w pt.   Shingles d/w pt.  Prostate cancer screening and PSA options (with potential risks and benefits of testing vs not testing) were discussed along with recent recs/guidelines.  He declined testing PSA at this point. Colonoscopy 2021 Advance directive- wife designated if patient were incapacitated.   History of hyponatremia.  Follow-up labs pending.   Parkinson's disease. On Mirapex  and sinemet  at baseline, compliant, no tremor at visit today.  He had some cramping that improved with electrolyte supplement.  No claudication.    Coronary calcifications noted on prev CT abd/pelvis.  No CP.  No SOB.    He wanted to get referral to local uro, had prev seen [facility].  Ordered.  No dysuria.  He has urinary frequency at baseline.   Meds, vitals, and allergies reviewed.   ROS: Per HPI unless specifically indicated in ROS section   Nad Ncat Neck supple, no LA Rrr Ctab Abd soft, not ttp Skin well-perfused. 5mm skin lesion on the nose, concern for BCC No tremor noted. Speech normal and fluent.

## 2024-03-21 LAB — URINALYSIS, ROUTINE W REFLEX MICROSCOPIC
Bilirubin Urine: NEGATIVE
Hgb urine dipstick: NEGATIVE
Ketones, ur: NEGATIVE
Leukocytes,Ua: NEGATIVE
Nitrite: NEGATIVE
Specific Gravity, Urine: 1.01 (ref 1.000–1.030)
Total Protein, Urine: NEGATIVE
Urine Glucose: NEGATIVE
Urobilinogen, UA: 1 (ref 0.0–1.0)
pH: 7 (ref 5.0–8.0)

## 2024-03-21 LAB — BASIC METABOLIC PANEL WITH GFR
BUN: 12 mg/dL (ref 6–23)
CO2: 28 meq/L (ref 19–32)
Calcium: 8.7 mg/dL (ref 8.4–10.5)
Chloride: 92 meq/L — ABNORMAL LOW (ref 96–112)
Creatinine, Ser: 1.31 mg/dL (ref 0.40–1.50)
GFR: 52.41 mL/min — ABNORMAL LOW (ref >60.00)
Glucose, Bld: 94 mg/dL (ref 70–99)
Potassium: 4.6 meq/L (ref 3.5–5.1)
Sodium: 128 meq/L — ABNORMAL LOW (ref 135–145)

## 2024-03-21 LAB — LIPID PANEL
Cholesterol: 152 mg/dL (ref 0–200)
HDL: 41.6 mg/dL (ref >39.00)
LDL Cholesterol: 96 mg/dL (ref 0–99)
NonHDL: 110.76
Total CHOL/HDL Ratio: 4
Triglycerides: 74 mg/dL (ref 0.0–149.0)
VLDL: 14.8 mg/dL (ref 0.0–40.0)

## 2024-03-21 LAB — SODIUM, URINE, RANDOM: Sodium, Ur: 96 mmol/L (ref 28–272)

## 2024-03-21 LAB — OSMOLALITY, URINE: Osmolality, Ur: 456 mosm/kg (ref 50–1200)

## 2024-03-23 ENCOUNTER — Ambulatory Visit: Payer: Self-pay | Admitting: Family Medicine

## 2024-03-23 DIAGNOSIS — I2510 Atherosclerotic heart disease of native coronary artery without angina pectoris: Secondary | ICD-10-CM | POA: Insufficient documentation

## 2024-03-23 NOTE — Assessment & Plan Note (Signed)
Flu vaccine - pt declined, d/w pt.  PNA vaccines- pt declined, d/w pt.  Covid vaccine declined. Tetanus d/w pt.   Shingles d/w pt.  Prostate cancer screening and PSA options (with potential risks and benefits of testing vs not testing) were discussed along with recent recs/guidelines.  He declined testing PSA at this point. Colonoscopy 2021 Advance directive- wife designated if patient were incapacitated.

## 2024-03-23 NOTE — Assessment & Plan Note (Signed)
 History of hyponatremia.  Follow-up labs pending.

## 2024-03-23 NOTE — Assessment & Plan Note (Signed)
 Advance directive- wife designated if patient were incapacitated.

## 2024-03-23 NOTE — Assessment & Plan Note (Signed)
 Coronary calcifications noted on prev CT abd/pelvis.  No CP.  No SOB.  See notes on labs.

## 2024-03-23 NOTE — Assessment & Plan Note (Signed)
 He wanted to get referral to local uro, had prev seen [facility].  Ordered.  No dysuria.  He has urinary frequency at baseline.

## 2024-03-23 NOTE — Assessment & Plan Note (Signed)
 5mm skin lesion on the nose, concern for [facility]  Referred to dermatology.  He can let me know if he cannot get an appointment.

## 2024-03-23 NOTE — Assessment & Plan Note (Signed)
 On Mirapex  and sinemet  at baseline, compliant, no tremor at visit today.  Continue as is.

## 2024-03-25 ENCOUNTER — Telehealth: Payer: Self-pay | Admitting: Family Medicine

## 2024-03-25 NOTE — Telephone Encounter (Unsigned)
 Copied from CRM [id number]. Topic: Clinical - Lab/Test Results >> Mar 25, 2024  9:07 AM Mesmerise C wrote: Patient returning a call for [name] advised clinic on 2 hour delay patient would like a call back to speak with her

## 2024-03-26 NOTE — Telephone Encounter (Signed)
 Copied from CRM [id number]. Topic: Clinical - Lab/Test Results >> Mar 24, 2024  1:06 PM Donna BRAVO wrote: Reason for CRM: patient returning call Albino Manuelita BROCKS, [facility]    03/24/24 12:57 PM Result Note LM for pt to return my call. >> Mar 25, 2024  4:48 PM Drema MATSU wrote: Patient has been trying to return Kelten Enochs's call. Patient stated that his wife has been giving him more salt since they discovered that it was low. Please call patient if there is any other concern. >> Mar 25, 2024 10:54 AM Thersia C wrote: Patient called in regarding needing o speak with Manuelita would like a callback  >> Mar 25, 2024  9:07 AM Mesmerise C wrote: Patient returning a call for Kelen Laura advised clinic on 2 hour delay patient would like a call back to speak with her  >> Mar 24, 2024  4:02 PM Mesmerise C wrote: Patient wanting to speak to Manuelita, called CAL stated she was on the phone with a patient will call him back

## 2024-03-31 NOTE — Telephone Encounter (Signed)
 Duplicate note has been addressed in open message

## 2024-04-08 ENCOUNTER — Ambulatory Visit

## 2024-04-22 ENCOUNTER — Telehealth: Payer: Self-pay | Admitting: Neurology

## 2024-04-22 NOTE — Telephone Encounter (Signed)
 Called patient and he is wanting to try a different therapy . He has heard a lot of bad things about Gabapentin and wants to hold off on trying it right now. I told him he can call back if he wants to change his mind and try the meds

## 2024-04-22 NOTE — Telephone Encounter (Signed)
 Would he like to try gabapentin?  If so, tell him we will start low dose, 100 mg tid and we may need to go up from there but he can call in a month and let us  know how he is doing.  Send in #90, RF 0 for now and he can let us  know how doing in a month

## 2024-04-22 NOTE — Telephone Encounter (Signed)
 Pt said he has neuropathy In his feet and needs something for it. He said he had mentioned it before.

## 2024-04-30 ENCOUNTER — Encounter: Payer: Self-pay | Admitting: Urology

## 2024-04-30 ENCOUNTER — Ambulatory Visit: Admitting: Urology

## 2024-04-30 VITALS — BP 145/89 | HR 90 | Ht 72.5 in | Wt 190.0 lb

## 2024-04-30 DIAGNOSIS — R399 Unspecified symptoms and signs involving the genitourinary system: Secondary | ICD-10-CM | POA: Diagnosis not present

## 2024-04-30 LAB — URINALYSIS, COMPLETE
Bilirubin, UA: NEGATIVE
Glucose, UA: NEGATIVE
Ketones, UA: NEGATIVE
Leukocytes,UA: NEGATIVE
Nitrite, UA: NEGATIVE
Protein,UA: NEGATIVE
RBC, UA: NEGATIVE
Specific Gravity, UA: 1.01 (ref 1.005–1.030)
Urobilinogen, Ur: 0.2 mg/dL (ref 0.2–1.0)
pH, UA: 6 (ref 5.0–7.5)

## 2024-04-30 LAB — MICROSCOPIC EXAMINATION
Bacteria, UA: NONE SEEN
RBC, Urine: NONE SEEN /HPF (ref 0–2)

## 2024-04-30 NOTE — Progress Notes (Signed)
 "  04/30/2024 10:31 AM   [name] 12/13/1945 [id number]  Referring provider: Cleatus Arlyss RAMAN, MD [address] [place],  [place] [address]  Chief Complaint  Patient presents with   Urinary Tract Infection   Urologic history: 1.  Lower urinary tract symptoms Urinary frequency/urgency Parkinson's Oxybutynin  5 mg daily  2.  Erectile dysfunction Sildenafil prn   HPI: [name] is a 79 y.o. male who presents to establish local urologic care.  Has seen Dr. Renay Moats in [place] for the past few years for erectile dysfunction and urinary frequency Has Parkinson's and voiding symptoms have improved on oxybutynin  5 mg daily Also taking sildenafil prn ED Urologic review of systems otherwise negative   PMH: Past Medical History:  Diagnosis Date   A-fib (HCC)    Arthritis    GERD (gastroesophageal reflux disease)    H/O urinary frequency    Heart murmur    as a child   History of bronchitis    HSV infection    oral   Hypertension    improved after back pain treated with surgery   Insomnia    Improved with Ambien , did not tolerate melatonin.   Joint pain    Parkinson disease (HCC)    Sleep apnea    no CPAP   Vertigo 10/13/2014   recurrent; trigger is cool air on left ear, improved with daily antihistamine    Surgical History: Past Surgical History:  Procedure Laterality Date   ACHILLES TENDON REPAIR Left    ALLOGRAFT APPLICATION Left 11/12/2020   Procedure: APPLICATION OF  INTEGRA;  Surgeon: Arelia Filippo, MD;  Location: [facility];  Service: Plastics;  Laterality: Left;   BACK SURGERY     COLONOSCOPY  02/2011   Negative,[facility]   DEBRIDEMENT AND CLOSURE WOUND Left 02/25/2018   Procedure: DEBRIDEMENT LEFT LEG APPLICATION INTEGRA;  Surgeon: Arelia Filippo, MD;  Location: [facility];  Service: Plastics;  Laterality: Left;   EYE SURGERY Bilateral    cataracts   INCISION AND DRAINAGE OF WOUND Left  11/12/2020   Procedure: DEBRIDEMENT LLE ULCER;  Surgeon: Arelia Filippo, MD;  Location: [facility];  Service: Plastics;  Laterality: Left;   KNEE ARTHROSCOPY     L   LUMBAR FUSION     2016   LUMBAR SPINE SURGERY  03/31/2019   exploration of lumbar fusion w/L5-S1 decompression redo L4-5 fusion   SPINE SURGERY  2003   cervical fusion   TONSILLECTOMY     varicoelectomy      Home Medications:  Allergies as of 04/30/2024       Reactions   Flexeril  [cyclobenzaprine ] Other (See Comments)   Constipation   Hctz [hydrochlorothiazide ] Other (See Comments)   Hyponatremia    Morphine And Codeine Hives, Other (See Comments)   Hallucinations Can tolerate oxycodone    Phenergan  [promethazine ] Other (See Comments)   Told to avoid due to Parkinson's   Sinemet  [carbidopa -levodopa ] Other (See Comments)   Intolerant of short acting form   Tambocor  [flecainide ] Nausea And Vomiting        Medication List        Accurate as of April 30, 2024 10:31 AM. If you have any questions, ask your nurse or doctor.          Carbidopa -Levodopa  ER 25-100 MG tablet controlled release Commonly known as: SINEMET  CR Take 1 tablet by mouth 3 (three) times daily. The timing of this medication is very important.  pramipexole  0.5 MG tablet Commonly known as: MIRAPEX  Take 1 tablet (0.5 mg total) by mouth 3 (three) times daily. The timing of this medication is very important.   acetaminophen  500 MG tablet Commonly known as: TYLENOL  Take 1,000 mg by mouth daily as needed for moderate pain.   cetirizine  10 MG tablet Commonly known as: ZYRTEC  TAKE 1 TABLET BY MOUTH EVERY DAY   cyanocobalamin 1000 MCG tablet Commonly known as: VITAMIN B12 Take 1,000 mcg by mouth daily.   docusate sodium  100 MG capsule Commonly known as: COLACE Take 1 capsule (100 mg total) by mouth 2 (two) times daily.   fluticasone  50 MCG/ACT nasal spray Commonly known as: FLONASE  Place 2 sprays into both  nostrils daily as needed for allergies or rhinitis.   Multivitamin Men 50+ Tabs Take 1 tablet by mouth daily.   sildenafil 20 MG tablet Commonly known as: REVATIO Take 20-100 mg by mouth daily as needed.   triamcinolone  cream 0.1 % Commonly known as: KENALOG  APPLY TOPICALLY 2 (TWO) TIMES DAILY AS NEEDED TO AFFECTED AREA        Allergies: Allergies[1]  Family History: Family History  Problem Relation Age of Onset   Hypertension Mother    Asthma Mother    Diabetes Father    Stroke Father    Parkinson's disease Father    Cancer Sister    Heart attack Paternal Grandfather        >55   Occular Albinism Son    Hypertension Son    Occular Albinism Daughter    Colon cancer Neg Hx    Prostate cancer Neg Hx     Social History:  reports that he has never smoked. He has never been exposed to tobacco smoke. He has never used smokeless tobacco. He reports that he does not drink alcohol and does not use drugs.   Physical Exam: BP (!) 145/89   Pulse 90   Ht 6' 0.5 (1.842 m)   Wt 190 lb (86.2 kg)   BMI 25.41 kg/m   Constitutional:  Alert, No acute distress. HEENT: [place] AT Respiratory: Normal respiratory effort, no increased work of breathing. Psychiatric: Normal mood and affect.   Assessment & Plan:    1. Lower urinary tract symptoms  Stable urinary frequency/urgency Continue oxybutynin  Annual follow-up; he usually spends the winter in [place]  January-March and will schedule follow-up visit November 2026   Tyler JAYSON Barba, MD  [facility] [address] [place], [place] [address] [phone]    [1]  Allergies Allergen Reactions   Flexeril  [Cyclobenzaprine ] Other (See Comments)    Constipation   Hctz [Hydrochlorothiazide ] Other (See Comments)    Hyponatremia    Morphine And Codeine Hives and Other (See Comments)    Hallucinations Can tolerate oxycodone    Phenergan  [Promethazine ] Other (See Comments)    Told to avoid due  to Parkinson's   Sinemet  [Carbidopa -Levodopa ] Other (See Comments)    Intolerant of short acting form   Tambocor  [Flecainide ] Nausea And Vomiting   "

## 2024-05-14 ENCOUNTER — Ambulatory Visit

## 2024-05-14 VITALS — Ht 72.6 in | Wt 190.0 lb

## 2024-05-14 DIAGNOSIS — Z0000 Encounter for general adult medical examination without abnormal findings: Secondary | ICD-10-CM | POA: Diagnosis not present

## 2024-05-14 NOTE — Patient Instructions (Signed)
 [name],  Thank you for taking the time for your Medicare Wellness Visit. I appreciate your continued commitment to your health goals. Please review the care plan we discussed, and feel free to reach out if I can assist you further.  Please note that Annual Wellness Visits do not include a physical exam. Some assessments may be limited, especially if the visit was conducted virtually. If needed, we may recommend an in-person follow-up with your provider.  Ongoing Care Seeing your primary care provider every 3 to 6 months helps us  monitor your health and provide consistent, personalized care.   Referrals If a referral was made during today's visit and you haven't received any updates within two weeks, please contact the referred provider directly to check on the status.  Recommended Screenings:  Health Maintenance  Topic Date Due   DTaP/Tdap/Td vaccine (1 - Tdap) Never done   Zoster (Shingles) Vaccine (1 of 2) Never done   Pneumococcal Vaccine for age over 41 (2 of 2 - PPSV23, PCV20, or PCV21) 06/26/2017   Flu Shot  07/15/2024*   Medicare Annual Wellness Visit  05/14/2025   Hepatitis C Screening  Completed   Meningitis B Vaccine  Aged Out   Colon Cancer Screening  Discontinued   COVID-19 Vaccine  Discontinued  *Topic was postponed. The date shown is not the original due date.       05/14/2024    3:37 PM  Advanced Directives  Does Patient Have a Medical Advance Directive? Yes  Type of Advance Directive Healthcare Power of Attorney  Copy of Healthcare Power of Attorney in Chart? No - copy requested    Vision: Annual vision screenings are recommended for early detection of glaucoma, cataracts, and diabetic retinopathy. These exams can also reveal signs of chronic conditions such as diabetes and high blood pressure.  Dental: Annual dental screenings help detect early signs of oral cancer, gum disease, and other conditions linked to overall health, including heart disease and  diabetes.  Please see the attached documents for additional preventive care recommendations.    [name] , Thank you for taking time to come for your Medicare Wellness Visit. I appreciate your ongoing commitment to your health goals. Please review the following plan we discussed and let me know if I can assist you in the future.   Screening recommendations/referrals: Colonoscopy:  Recommended yearly ophthalmology/optometry visit for glaucoma screening and checkup Recommended yearly dental visit for hygiene and checkup  Vaccinations: Influenza vaccine:  Pneumococcal vaccine:  Tdap vaccine:  Shingles vaccine:      Preventive Care 65 Years and Older, Male Preventive care refers to lifestyle choices and visits with your health care provider that can promote health and wellness. What does preventive care include? A yearly physical exam. This is also called an annual well check. Dental exams once or twice a year. Routine eye exams. Ask your health care provider how often you should have your eyes checked. Personal lifestyle choices, including: Daily care of your teeth and gums. Regular physical activity. Eating a healthy diet. Avoiding tobacco and drug use. Limiting alcohol use. Practicing safe sex. Taking low doses of aspirin every day. Taking vitamin and mineral supplements as recommended by your health care provider. What happens during an annual well check? The services and screenings done by your health care provider during your annual well check will depend on your age, overall health, lifestyle risk factors, and family history of disease. Counseling  Your health care provider may ask you questions about  your: Alcohol use. Tobacco use. Drug use. Emotional well-being. Home and relationship well-being. Sexual activity. Eating habits. History of falls. Memory and ability to understand (cognition). Work and work astronomer. Screening  You may have the following tests  or measurements: Height, weight, and BMI. Blood pressure. Lipid and cholesterol levels. These may be checked every 5 years, or more frequently if you are over 24 years old. Skin check. Lung cancer screening. You may have this screening every year starting at age 15 if you have a 30-pack-year history of smoking and currently smoke or have quit within the past 15 years. Fecal occult blood test (FOBT) of the stool. You may have this test every year starting at age 65. Flexible sigmoidoscopy or colonoscopy. You may have a sigmoidoscopy every 5 years or a colonoscopy every 10 years starting at age 19. Prostate cancer screening. Recommendations will vary depending on your family history and other risks. Hepatitis C blood test. Hepatitis B blood test. Sexually transmitted disease (STD) testing. Diabetes screening. This is done by checking your blood sugar (glucose) after you have not eaten for a while (fasting). You may have this done every 1-3 years. Abdominal aortic aneurysm (AAA) screening. You may need this if you are a current or former smoker. Osteoporosis. You may be screened starting at age 65 if you are at high risk. Talk with your health care provider about your test results, treatment options, and if necessary, the need for more tests. Vaccines  Your health care provider may recommend certain vaccines, such as: Influenza vaccine. This is recommended every year. Tetanus, diphtheria, and acellular pertussis (Tdap, Td) vaccine. You may need a Td booster every 10 years. Zoster vaccine. You may need this after age 20. Pneumococcal 13-valent conjugate (PCV13) vaccine. One dose is recommended after age 46. Pneumococcal polysaccharide (PPSV23) vaccine. One dose is recommended after age 55. Talk to your health care provider about which screenings and vaccines you need and how often you need them. This information is not intended to replace advice given to you by your health care provider. Make  sure you discuss any questions you have with your health care provider. Document Released: 04/30/2015 Document Revised: 12/22/2015 Document Reviewed: 02/02/2015 Elsevier Interactive Patient Education  2017 [employer].  Fall Prevention in the Home Falls can cause injuries. They can happen to people of all ages. There are many things you can do to make your home safe and to help prevent falls. What can I do on the outside of my home? Regularly fix the edges of walkways and driveways and fix any cracks. Remove anything that might make you trip as you walk through a door, such as a raised step or threshold. Trim any bushes or trees on the path to your home. Use bright outdoor lighting. Clear any walking paths of anything that might make someone trip, such as rocks or tools. Regularly check to see if handrails are loose or broken. Make sure that both sides of any steps have handrails. Any raised decks and porches should have guardrails on the edges. Have any leaves, snow, or ice cleared regularly. Use sand or salt on walking paths during winter. Clean up any spills in your garage right away. This includes oil or grease spills. What can I do in the bathroom? Use night lights. Install grab bars by the toilet and in the tub and shower. Do not use towel bars as grab bars. Use non-skid mats or decals in the tub or shower. If you need to  sit down in the shower, use a plastic, non-slip stool. Keep the floor dry. Clean up any water that spills on the floor as soon as it happens. Remove soap buildup in the tub or shower regularly. Attach bath mats securely with double-sided non-slip rug tape. Do not have throw rugs and other things on the floor that can make you trip. What can I do in the bedroom? Use night lights. Make sure that you have a light by your bed that is easy to reach. Do not use any sheets or blankets that are too big for your bed. They should not hang down onto the floor. Have a firm  chair that has side arms. You can use this for support while you get dressed. Do not have throw rugs and other things on the floor that can make you trip. What can I do in the kitchen? Clean up any spills right away. Avoid walking on wet floors. Keep items that you use a lot in easy-to-reach places. If you need to reach something above you, use a strong step stool that has a grab bar. Keep electrical cords out of the way. Do not use floor polish or wax that makes floors slippery. If you must use wax, use non-skid floor wax. Do not have throw rugs and other things on the floor that can make you trip. What can I do with my stairs? Do not leave any items on the stairs. Make sure that there are handrails on both sides of the stairs and use them. Fix handrails that are broken or loose. Make sure that handrails are as long as the stairways. Check any carpeting to make sure that it is firmly attached to the stairs. Fix any carpet that is loose or worn. Avoid having throw rugs at the top or bottom of the stairs. If you do have throw rugs, attach them to the floor with carpet tape. Make sure that you have a light switch at the top of the stairs and the bottom of the stairs. If you do not have them, ask someone to add them for you. What else can I do to help prevent falls? Wear shoes that: Do not have high heels. Have rubber bottoms. Are comfortable and fit you well. Are closed at the toe. Do not wear sandals. If you use a stepladder: Make sure that it is fully opened. Do not climb a closed stepladder. Make sure that both sides of the stepladder are locked into place. Ask someone to hold it for you, if possible. Clearly mark and make sure that you can see: Any grab bars or handrails. First and last steps. Where the edge of each step is. Use tools that help you move around (mobility aids) if they are needed. These include: Canes. Walkers. Scooters. Crutches. Turn on the lights when you go  into a dark area. Replace any light bulbs as soon as they burn out. Set up your furniture so you have a clear path. Avoid moving your furniture around. If any of your floors are uneven, fix them. If there are any pets around you, be aware of where they are. Review your medicines with your doctor. Some medicines can make you feel dizzy. This can increase your chance of falling. Ask your doctor what other things that you can do to help prevent falls. This information is not intended to replace advice given to you by your health care provider. Make sure you discuss any questions you have with your health care  provider. Document Released: 01/28/2009 Document Revised: 09/09/2015 Document Reviewed: 05/08/2014 Elsevier Interactive Patient Education  2017 [employer].

## 2024-05-14 NOTE — Progress Notes (Signed)
 "  Chief Complaint  Patient presents with   Medicare Wellness     Subjective:   Giovanni K Dufrane is a 79 y.o. male who presents for a Medicare Annual Wellness Visit.  Visit info / Clinical Intake: Medicare Wellness Visit Type:: Subsequent Annual Wellness Visit Persons participating in visit and providing information:: patient Medicare Wellness Visit Mode:: Telephone If telephone:: video declined Since this visit was completed virtually, some vitals may be partially provided or unavailable. Missing vitals are due to the limitations of the virtual format.: Unable to obtain vitals - no equipment If Telephone or Video please confirm:: I connected with patient using audio/video enable telemedicine. I verified patient identity with two identifiers, discussed telehealth limitations, and patient agreed to proceed. Patient Location:: home Provider Location:: home Interpreter Needed?: No Pre-visit prep was completed: no AWV questionnaire completed by patient prior to visit?: no Living arrangements:: lives with spouse/significant other Patient's Overall Health Status Rating: good Typical amount of pain: none Does pain affect daily life?: no Are you currently prescribed opioids?: no  Dietary Habits and Nutritional Risks How many meals a day?: 2 Eats fruit and vegetables daily?: yes Most meals are obtained by: eating out; preparing own meals In the last 2 weeks, have you had any of the following?: none Diabetic:: no  Functional Status Activities of Daily Living (to include ambulation/medication): Independent Ambulation: Independent Medication Administration: Independent Home Management (perform basic housework or laundry): Independent Manage your own finances?: yes Primary transportation is: driving Concerns about vision?: no *vision screening is required for WTM* Concerns about hearing?: no  Fall Screening Falls in the past year?: 0 Number of falls in past year: 0 Was there an  injury with Fall?: 0 Fall Risk Category Calculator: 0 Patient Fall Risk Level: Low Fall Risk  Fall Risk Patient at Risk for Falls Due to: No Fall Risks Fall risk Follow up: Falls evaluation completed; Education provided; Falls prevention discussed  Home and Transportation Safety: All rugs have non-skid backing?: N/A, no rugs All stairs or steps have railings?: N/A, no stairs Grab bars in the bathtub or shower?: yes Have non-skid surface in bathtub or shower?: (!) no Good home lighting?: yes Regular seat belt use?: yes Hospital stays in the last year:: no  Cognitive Assessment Difficulty concentrating, remembering, or making decisions? : no Will 6CIT or Mini Cog be Completed: yes What year is it?: 0 points What month is it?: 0 points Give patient an address phrase to remember (5 components): its very sunny outside today january About what time is it?: 0 points Count backwards from 20 to 1: 2 points Say the months of the year in reverse: 2 points Repeat the address phrase from earlier: 2 points 6 CIT Score: 6 points  Advance Directives (For Healthcare) Does Patient Have a Medical Advance Directive?: Yes Type of Advance Directive: Healthcare Power of Attorney Copy of Healthcare Power of Attorney in Chart?: No - copy requested  Reviewed/Updated  Reviewed/Updated: Reviewed All (Medical, Surgical, Family, Medications, Allergies, Care Teams, Patient Goals); Surgical History; Family History; Medications; Allergies; Care Teams; Patient Goals; Medical History    Allergies (verified) Flexeril  [cyclobenzaprine ], Hctz [hydrochlorothiazide ], Morphine and codeine, Phenergan  [promethazine ], Sinemet  [carbidopa -levodopa ], and Tambocor  [flecainide ]   Current Medications (verified) Outpatient Encounter Medications as of 05/14/2024  Medication Sig   acetaminophen  (TYLENOL ) 500 MG tablet Take 1,000 mg by mouth daily as needed for moderate pain.   Carbidopa -Levodopa  ER (SINEMET  CR) 25-100 MG  tablet controlled release Take 1 tablet by mouth 3 (three) times  daily.   cetirizine  (ZYRTEC ) 10 MG tablet TAKE 1 TABLET BY MOUTH EVERY DAY   cyanocobalamin (VITAMIN B12) 1000 MCG tablet Take 1,000 mcg by mouth daily.   docusate sodium  (COLACE) 100 MG capsule Take 1 capsule (100 mg total) by mouth 2 (two) times daily.   fluticasone  (FLONASE ) 50 MCG/ACT nasal spray Place 2 sprays into both nostrils daily as needed for allergies or rhinitis.   Multiple Vitamins-Minerals (MULTIVITAMIN MEN 50+) TABS Take 1 tablet by mouth daily.   pramipexole  (MIRAPEX ) 0.5 MG tablet Take 1 tablet (0.5 mg total) by mouth 3 (three) times daily.   sildenafil (REVATIO) 20 MG tablet Take 20-100 mg by mouth daily as needed.   triamcinolone  cream (KENALOG ) 0.1 % APPLY TOPICALLY 2 (TWO) TIMES DAILY AS NEEDED TO AFFECTED AREA   No facility-administered encounter medications on file as of 05/14/2024.    History: Past Medical History:  Diagnosis Date   A-fib (HCC)    Arthritis    GERD (gastroesophageal reflux disease)    H/O urinary frequency    Heart murmur    as a child   History of bronchitis    HSV infection    oral   Hypertension    improved after back pain treated with surgery   Insomnia    Improved with Ambien , did not tolerate melatonin.   Joint pain    Parkinson disease (HCC)    Sleep apnea    no CPAP   Vertigo 10/13/2014   recurrent; trigger is cool air on left ear, improved with daily antihistamine   Past Surgical History:  Procedure Laterality Date   ACHILLES TENDON REPAIR Left    ALLOGRAFT APPLICATION Left 11/12/2020   Procedure: APPLICATION OF  INTEGRA;  Surgeon: Arelia Filippo, MD;  Location: [facility];  Service: Plastics;  Laterality: Left;   BACK SURGERY     COLONOSCOPY  02/2011   Negative,[facility]   DEBRIDEMENT AND CLOSURE WOUND Left 02/25/2018   Procedure: DEBRIDEMENT LEFT LEG APPLICATION INTEGRA;  Surgeon: Arelia Filippo, MD;  Location: [facility];  Service: Plastics;  Laterality: Left;   EYE SURGERY Bilateral    cataracts   INCISION AND DRAINAGE OF WOUND Left 11/12/2020   Procedure: DEBRIDEMENT LLE ULCER;  Surgeon: Arelia Filippo, MD;  Location: [facility];  Service: Plastics;  Laterality: Left;   KNEE ARTHROSCOPY     L   LUMBAR FUSION     2016   LUMBAR SPINE SURGERY  03/31/2019   exploration of lumbar fusion w/L5-S1 decompression redo L4-5 fusion   SPINE SURGERY  2003   cervical fusion   TONSILLECTOMY     varicoelectomy     Family History  Problem Relation Age of Onset   Hypertension Mother    Asthma Mother    Diabetes Father    Stroke Father    Parkinson's disease Father    Cancer Sister    Heart attack Paternal Grandfather        >55   Occular Albinism Son    Hypertension Son    [employer] Manager Albinism Daughter    Colon cancer Neg Hx    Prostate cancer Neg Hx    Social History   Occupational History   Occupation: retired  Tobacco Use   Smoking status: Never    Passive exposure: Never   Smokeless tobacco: Never  Vaping Use   Vaping status: Never Used  Substance and Sexual Activity   Alcohol use: No   Drug use: No  Sexual activity: Not Currently   Tobacco Counseling Counseling given: Not Answered  SDOH Screenings   Food Insecurity: No Food Insecurity (05/14/2024)  Housing: Unknown (05/14/2024)  Transportation Needs: No Transportation Needs (05/14/2024)  Utilities: Not At Risk (05/14/2024)  Alcohol Screen: Low Risk (12/26/2022)  Depression (PHQ2-9): Low Risk (05/14/2024)  Financial Resource Strain: Low Risk (07/19/2023)  Physical Activity: Insufficiently Active (05/14/2024)  Social Connections: Socially Integrated (05/14/2024)  Stress: No Stress Concern Present (05/14/2024)  Tobacco Use: Low Risk (05/14/2024)  Health Literacy: Adequate Health Literacy (05/14/2024)   See flowsheets for full screening details  Depression Screen PHQ 2 & 9 Depression Scale- Over the past 2 weeks, how often  have you been bothered by any of the following problems? Little interest or pleasure in doing things: 0 Feeling down, depressed, or hopeless (PHQ Adolescent also includes...irritable): 0 PHQ-2 Total Score: 0 Trouble falling or staying asleep, or sleeping too much: 0 Feeling tired or having little energy: 0 Poor appetite or overeating (PHQ Adolescent also includes...weight loss): 0 Feeling bad about yourself - or that you are a failure or have let yourself or your family down: 0 Trouble concentrating on things, such as reading the newspaper or watching television (PHQ Adolescent also includes...like school work): 0 Moving or speaking so slowly that other people could have noticed. Or the opposite - being so fidgety or restless that you have been moving around a lot more than usual: 0 Thoughts that you would be better off dead, or of hurting yourself in some way: 0 PHQ-9 Total Score: 0 If you checked off any problems, how difficult have these problems made it for you to do your work, take care of things at home, or get along with other people?: Not difficult at all     Goals Addressed             This Visit's Progress    Increase physical activity       Challege myself             Objective:    Today's Vitals   05/14/24 1535  Weight: 190 lb (86.2 kg)  Height: 6' 0.6 (1.844 m)   Body mass index is 25.34 kg/m.  Hearing/Vision screen Hearing Screening - Comments:: No trouble hearing Vision Screening - Comments:: [facility] Up to date Immunizations and Health Maintenance Health Maintenance  Topic Date Due   DTaP/Tdap/Td (1 - Tdap) Never done   Zoster Vaccines- Shingrix (1 of 2) Never done   Pneumococcal Vaccine: 50+ Years (2 of 2 - PPSV23, PCV20, or PCV21) 06/26/2017   Influenza Vaccine  07/15/2024 (Originally 11/16/2023)   Medicare Annual Wellness (AWV)  05/14/2025   Hepatitis C Screening  Completed   Meningococcal B Vaccine  Aged Out   Colonoscopy   Discontinued   COVID-19 Vaccine  Discontinued        Assessment/Plan:  This is a routine wellness examination for [name].  Patient Care Team: Cleatus Arlyss RAMAN, MD as PCP - General (Family Medicine) Tat, Asberry RAMAN, DO as Consulting Physician (Neurology) Mavis Purchase, MD as Consulting Physician (Neurosurgery)  I have personally reviewed and noted the following in the patients chart:   Medical and social history Use of alcohol, tobacco or illicit drugs  Current medications and supplements including opioid prescriptions. Functional ability and status Nutritional status Physical activity Advanced directives List of other physicians Hospitalizations, surgeries, and ER visits in previous 12 months Vitals Screenings to include cognitive, depression, and falls Referrals and appointments  No  orders of the defined types were placed in this encounter.  In addition, I have reviewed and discussed with patient certain preventive protocols, quality metrics, and best practice recommendations. A written personalized care plan for preventive services as well as general preventive health recommendations were provided to patient.   Linnae Rasool, LPN   8/71/7973   Return in 1 year (on 05/14/2025).  After Visit Summary: (MyChart) Due to this being a telephonic visit, the after visit summary with patients personalized plan was offered to patient via MyChart   Nurse Notes:  "

## 2024-07-31 ENCOUNTER — Ambulatory Visit: Admitting: Neurology

## 2024-08-27 ENCOUNTER — Ambulatory Visit: Admitting: Neurology

## 2025-03-03 ENCOUNTER — Ambulatory Visit: Admitting: Urology
